# Patient Record
Sex: Male | Born: 1970 | Race: Black or African American | Hispanic: No | Marital: Single | Smoking: Never smoker
Health system: Southern US, Community
[De-identification: ages and names within clinical notes are randomized; demographics above are authoritative.]

## PROBLEM LIST (undated history)

## (undated) ENCOUNTER — Emergency Department (HOSPITAL_COMMUNITY): Discharge status: Absent without leave | Payer: Self-pay

## (undated) DIAGNOSIS — I890 Lymphedema, not elsewhere classified: Secondary | ICD-10-CM

## (undated) DIAGNOSIS — D649 Anemia, unspecified: Secondary | ICD-10-CM

## (undated) DIAGNOSIS — Z5900 Homelessness unspecified: Secondary | ICD-10-CM

## (undated) DIAGNOSIS — M009 Pyogenic arthritis, unspecified: Secondary | ICD-10-CM

## (undated) DIAGNOSIS — F200 Paranoid schizophrenia: Secondary | ICD-10-CM

## (undated) DIAGNOSIS — L0390 Cellulitis, unspecified: Secondary | ICD-10-CM

## (undated) HISTORY — DX: Pyogenic arthritis, unspecified: M00.9

---

## 2006-03-20 ENCOUNTER — Emergency Department (HOSPITAL_COMMUNITY): Admission: EM | Admit: 2006-03-20 | Discharge: 2006-03-21 | Payer: Self-pay | Admitting: Emergency Medicine

## 2007-09-29 ENCOUNTER — Ambulatory Visit: Payer: Self-pay | Admitting: Vascular Surgery

## 2007-09-29 ENCOUNTER — Inpatient Hospital Stay (HOSPITAL_COMMUNITY): Admission: EM | Admit: 2007-09-29 | Discharge: 2007-10-01 | Payer: Self-pay | Admitting: Emergency Medicine

## 2007-09-29 ENCOUNTER — Encounter (INDEPENDENT_AMBULATORY_CARE_PROVIDER_SITE_OTHER): Payer: Self-pay | Admitting: Internal Medicine

## 2008-09-26 ENCOUNTER — Emergency Department (HOSPITAL_COMMUNITY): Admission: EM | Admit: 2008-09-26 | Discharge: 2008-09-27 | Payer: Self-pay | Admitting: Emergency Medicine

## 2009-01-12 ENCOUNTER — Emergency Department (HOSPITAL_COMMUNITY): Admission: EM | Admit: 2009-01-12 | Discharge: 2009-01-13 | Payer: Self-pay | Admitting: Emergency Medicine

## 2009-03-24 ENCOUNTER — Other Ambulatory Visit: Payer: Self-pay | Admitting: Emergency Medicine

## 2009-03-25 ENCOUNTER — Emergency Department (HOSPITAL_COMMUNITY): Admission: EM | Admit: 2009-03-25 | Discharge: 2009-03-26 | Payer: Self-pay | Admitting: Emergency Medicine

## 2009-03-26 ENCOUNTER — Ambulatory Visit: Payer: Self-pay | Admitting: Psychiatry

## 2009-03-26 ENCOUNTER — Inpatient Hospital Stay (HOSPITAL_COMMUNITY): Admission: AD | Admit: 2009-03-26 | Discharge: 2009-03-29 | Payer: Self-pay | Admitting: Psychiatry

## 2010-10-04 LAB — DIFFERENTIAL
Eosinophils Relative: 5 % (ref 0–5)
Lymphocytes Relative: 53 % — ABNORMAL HIGH (ref 12–46)
Lymphs Abs: 2.3 10*3/uL (ref 0.7–4.0)
Monocytes Absolute: 0.4 10*3/uL (ref 0.1–1.0)

## 2010-10-04 LAB — ETHANOL: Alcohol, Ethyl (B): <5 mg/dL (ref 0–10)

## 2010-10-04 LAB — TRICYCLICS SCREEN, URINE: TCA Scrn: NOT DETECTED

## 2010-10-04 LAB — BASIC METABOLIC PANEL
GFR calc non Af Amer: >60 mL/min (ref >60)
Glucose, Bld: 97 mg/dL (ref 70–99)
Potassium: 3.8 mEq/L (ref 3.5–5.1)
Sodium: 139 mEq/L (ref 135–145)

## 2010-10-04 LAB — RAPID URINE DRUG SCREEN, HOSP PERFORMED
Barbiturates: NOT DETECTED
Cocaine: POSITIVE — AB

## 2010-10-04 LAB — CBC
HCT: 35.7 % — ABNORMAL LOW (ref 39.0–52.0)
Hemoglobin: 12.3 g/dL — ABNORMAL LOW (ref 13.0–17.0)
WBC: 4.3 10*3/uL (ref 4.0–10.5)

## 2010-10-10 LAB — CBC
HCT: 39.4 % (ref 39.0–52.0)
MCHC: 34.8 g/dL (ref 30.0–36.0)
MCV: 95.6 fL (ref 78.0–100.0)
Platelets: 164 10*3/uL (ref 150–400)
RDW: 13 % (ref 11.5–15.5)

## 2010-10-10 LAB — URINALYSIS, ROUTINE W REFLEX MICROSCOPIC
Hgb urine dipstick: NEGATIVE
Leukocytes, UA: NEGATIVE
Nitrite: NEGATIVE
Specific Gravity, Urine: 1.033 — ABNORMAL HIGH (ref 1.005–1.030)
Urobilinogen, UA: 1 mg/dL (ref 0.0–1.0)

## 2010-10-10 LAB — COMPREHENSIVE METABOLIC PANEL
Albumin: 3.5 g/dL (ref 3.5–5.2)
BUN: 27 mg/dL — ABNORMAL HIGH (ref 6–23)
Creatinine, Ser: 1.31 mg/dL (ref 0.4–1.5)
Total Bilirubin: 0.5 mg/dL (ref 0.3–1.2)
Total Protein: 6.3 g/dL (ref 6.0–8.3)

## 2010-10-10 LAB — URINE MICROSCOPIC-ADD ON

## 2010-10-10 LAB — DIFFERENTIAL
Basophils Absolute: 0 10*3/uL (ref 0.0–0.1)
Lymphocytes Relative: 45 % (ref 12–46)
Monocytes Absolute: 0.4 10*3/uL (ref 0.1–1.0)
Monocytes Relative: 9 % (ref 3–12)
Neutro Abs: 1.7 10*3/uL (ref 1.7–7.7)

## 2010-11-12 NOTE — H&P (Signed)
NAME:  YORDIN, RHODA NO.:  [id number]   MEDICAL RECORD NO.:  [id number]          PATIENT TYPE:  EMS   LOCATION:  ED                           FACILITY:  [facility]   PHYSICIAN:  Thomasenia Bottoms, MDDATE OF BIRTH:  January 17, 1971   DATE OF ADMISSION:  09/28/2007  DATE OF DISCHARGE:                              HISTORY & PHYSICAL   CHIEF COMPLAINT:  Right leg is bigger than left.   HISTORY OF PRESENTING ILLNESS:  Mr. Stebner is a 40 year old with history  of schizophrenia, who presents because his right leg is larger than his  left; he says it has been that way for several days, but he is unable to  be more specific.  He says that it did this once, went down and now it  has come back again, but he is not able to be more specific.  He is a  very poor historian.  He says his leg does not hurt.  He has not had a  chest pain or shortness of breath.  His appetite has been fine.   PAST MEDICAL HISTORY:  Significant for depression and schizoaffective  disorder versus schizophrenia.  In this computer, I see no  hospitalizations, but it appears he was transferred to [facility] in  2007 after hearing voices medications.   MEDICATIONS:  He says he takes none.   SOCIAL HISTORY:  He denies a smoking cigarettes, drinking alcohol or  using any illicit drugs.   FAMILY HISTORY:  None, though the patient is somewhat unreliable.   REVIEW OF SYSTEMS:  Deferred, as the patient is a very poor historian.   PHYSICAL EXAMINATION:  VITAL SIGNS:  In the emergency department, his  temperature was 98.5, blood pressure 115/56, pulse 81, respiratory rate  18, O2 SATs 97% on room air.  GENERAL:  On physical examination, the patient is in no acute distress.  He is well-nourished, well-developed.  He does have somewhat of a flat  affect.  HEENT:  Normocephalic, atraumatic.  His sclerae are anicteric.  Oral  mucosa moist.  NECK:  Supple.  No lymphadenopathy, no thyromegaly, no jugular venous  distention.  CARDIAC:  Regular rate and rhythm.  No murmurs, gallops or rubs  appreciated.  LUNGS:  Clear to auscultation bilaterally.  Somewhat diminished breath  sounds, but no wheezes, rhonchi or rales.  ABDOMEN:  Soft, nontender and nondistended.  Normoactive bowel sounds.  No masses are appreciated.  No rebound or guarding.  EXTREMITIES:  His extremities reveal no evidence of clubbing, cyanosis  or pitting edema.  His right lower extremity, however, is significantly  larger than his left lower extremity; it is noticeable in the thigh all  the way down to the ankle, but again, there is no pitting.  No skin  changes.  NEUROLOGICAL:  The patient is awake and alert.  He is cooperative.  He  does have the flat affect, but he is appropriate.  He moves each of his  extremities with no evidence of focal motor deficit.  He has 5/5  strength in his upper and lower extremities.  His  cranial nerves II-XII  are grossly intact.  SKIN:  Intact with no open lesions or rashes.  MUSCULOSKELETAL:  Examination reveals no evidence of effusion of his  joints.   DATA:  The patient's sodium is 140, potassium 3.5, chloride 106, bicarb  25, glucose 140, BUN 17, creatinine 1.26, AST 22, ALT 12, total  bilirubin 0.8.  PTT is 31, PT is 13.6 with an INR of 1.  D-dimer is  within normal limits.  CBC reveals a white blood cell count of 5.3,  hemoglobin 12.1, hematocrit 35 and platelet count is normal at 168,000.  His urinalysis is unremarkable.   ASSESSMENT AND PLAN:  1. Enlarged right leg.  The plan is to admit the patient for Dopplers      in the morning.  He has received a dose of full-strength Lovenox      here in the emergency department and we will continue this until he      rules out for DVT.  If he does rule in, we would have to ensure      that the patient will be able to follow up appropriately with      Coumadin.  He does not have a primary care physician at this time.  2. Schizophrenia and  depression.  It is unclear why the patient is not      on any medication.  He has no evidence of psychosis at this      juncture.      Thomasenia Bottoms, MD  Electronically Signed     CVC/MEDQ  D:  09/29/2007  T:  09/29/2007  Job:  [id number]

## 2010-11-15 NOTE — Discharge Summary (Signed)
NAME:  Jonathan Meadows, Jonathan Meadows                ACCOUNT NO.:  [id number]   MEDICAL RECORD NO.:  [id number]          PATIENT TYPE:  INP   LOCATION:  1513                         FACILITY:  [facility]   PHYSICIAN:  Mobolaji B. Bakare, M.D.DATE OF BIRTH:  1970-07-02   DATE OF ADMISSION:  09/28/2007  DATE OF DISCHARGE:  10/01/2007                               DISCHARGE SUMMARY   PRIMARY CARE PHYSICIAN:  Unassigned.   [facility]   FINAL DIAGNOSES:  1. Right thigh lymphedema, unclear etiology.  2. Hypertension.   SECONDARY DIAGNOSIS:  Schizophrenia, stable.   PROCEDURES:  1. Lower extremity Dopplers are negative for DVT.  2. CT scan of the pelvis showed findings compatible with cellulitis or      lymphedema of the right thigh.  3. CT scan of the right lower extremity showed no discrete mass,      hematoma, abscess that may be mild subcutaneous edema.  No evidence      of deep venous thrombosis.   BRIEF HISTORY:  Please refer to the admission H&P for full details.  In  brief, Jonathan Meadows is a 40 year old African American male with history of  schizophrenia which apparently has been stable.  He was not on any  regular medication.  He presented to the emergency room with chief  complaint of right leg bigger than the left.  On evaluation, this was  obviously bigger right.  There was obvious enlargement of the right  thigh more than the left.  On further questioning, patient's stated that  this  has been a chronic problem.  It comes on and off and this  recurrence occurred in the past couple of days prior to hospitalization.  Patient was seen in the emergency room during the night and concern was  for DVT hence he was empirically started on DVT treatment with Lovenox  and to obtain immediate lower extremity Dopplers the following day.  Upon evaluation on the following day, the swelling had receded; however,  he gave a distinct history of recurrent swelling.  Hence, further  evaluation was pursued with  a CT angiogram thigh and lower extremity.  Result is as noted above.  Patient has no fever, elevated white cell  count, or tenderness involving the right thigh to suggest cellulitis.  Hence, it was thought to be a lymphedema mostly given the recurrent  nature.  Patient tells me he had been advised previously to use  compression stockings but he has not been compliant with it.  Etiology  of this lymphedema is unknown.  CT scan of the pelvis and right thigh  excluded adenopathy or any benign tumor.  Patient was advised to wear  compression stockings to right leg while ambulating.   Hypertension.  He was noted to have elevated blood pressure during the  course of hospitalization ranging between 149/101 to 156/79 hence he was  started on hydrochlorothiazide 12.5 mg daily and patient was advised to  check BMET in 2 weeks at [facility].   DISCHARGE MEDICATIONS:  Hydrochlorothiazide 12.5 mg p.o. daily.   DISCHARGE INSTRUCTIONS:  1. Wear thigh-high compression stockings, right leg,  while ambulating.  2. Check BMET in 2 weeks.  3. Follow up with [facility].      Mobolaji B. Corky Downs, M.D.  Electronically Signed     MBB/MEDQ  D:  11/06/2007  T:  11/06/2007  Job:  [id number]   cc:   Mobolaji B. Corky Downs, M.D.

## 2011-03-24 LAB — URINALYSIS, ROUTINE W REFLEX MICROSCOPIC
Bilirubin Urine: NEGATIVE
Glucose, UA: NEGATIVE
Hgb urine dipstick: NEGATIVE
Ketones, ur: NEGATIVE
pH: 6

## 2011-03-25 LAB — CBC
HCT: 35 — ABNORMAL LOW
Hemoglobin: 12.1 — ABNORMAL LOW
MCHC: 34.7
MCV: 92.6
RDW: 13.7

## 2011-03-25 LAB — COMPREHENSIVE METABOLIC PANEL
BUN: 17
CO2: 25
Calcium: 8.7
GFR calc non Af Amer: >60
Glucose, Bld: 140 — ABNORMAL HIGH
Total Protein: 5.8 — ABNORMAL LOW

## 2011-03-25 LAB — DIFFERENTIAL
Basophils Relative: 1
Lymphs Abs: 1.9
Monocytes Relative: 12
Neutro Abs: 2.7
Neutrophils Relative %: 50

## 2011-03-25 LAB — BASIC METABOLIC PANEL
CO2: 27
Calcium: 9
Creatinine, Ser: 1.03
GFR calc Af Amer: >60
GFR calc non Af Amer: >60
Glucose, Bld: 98
Sodium: 138

## 2011-03-25 LAB — D-DIMER, QUANTITATIVE: D-Dimer, Quant: <0.22

## 2011-03-25 LAB — PROTIME-INR
INR: 1
Prothrombin Time: 13.6

## 2011-07-29 ENCOUNTER — Emergency Department (HOSPITAL_COMMUNITY)
Admission: EM | Admit: 2011-07-29 | Discharge: 2011-07-29 | Disposition: A | Discharge status: Home / Self Care | Payer: Medicaid Other | Attending: Emergency Medicine | Admitting: Emergency Medicine

## 2011-07-29 ENCOUNTER — Encounter (HOSPITAL_COMMUNITY): Payer: Self-pay | Admitting: *Deleted

## 2011-07-29 DIAGNOSIS — R51 Headache: Secondary | ICD-10-CM | POA: Insufficient documentation

## 2011-07-29 DIAGNOSIS — M79644 Pain in right finger(s): Secondary | ICD-10-CM

## 2011-07-29 DIAGNOSIS — R6884 Jaw pain: Secondary | ICD-10-CM | POA: Insufficient documentation

## 2011-07-29 DIAGNOSIS — M79609 Pain in unspecified limb: Secondary | ICD-10-CM | POA: Insufficient documentation

## 2011-07-29 DIAGNOSIS — M7989 Other specified soft tissue disorders: Secondary | ICD-10-CM | POA: Insufficient documentation

## 2011-07-29 DIAGNOSIS — M25569 Pain in unspecified knee: Secondary | ICD-10-CM | POA: Insufficient documentation

## 2011-07-29 MED ORDER — IBUPROFEN 800 MG PO TABS
800.0000 mg | ORAL_TABLET | Freq: Once | ORAL | Status: AC
  Administered 2011-07-29: 800 mg via ORAL

## 2011-07-29 MED ORDER — IBUPROFEN 800 MG PO TABS
ORAL_TABLET | ORAL | Status: AC
  Filled 2011-07-29: qty 1

## 2011-07-29 MED ORDER — IBUPROFEN 600 MG PO TABS: 600.0000 mg | ORAL_TABLET | Freq: Four times a day (QID) | ORAL | Status: AC | PRN

## 2011-07-29 MED ORDER — DOXYCYCLINE HYCLATE 100 MG PO CAPS: 100.0000 mg | ORAL_CAPSULE | Freq: Two times a day (BID) | ORAL | Status: AC

## 2011-07-29 NOTE — ED Notes (Signed)
The pt has multiple symptoms.  He has face pain  abd pain  He has cold cough and he hurts all over for several days

## 2011-07-29 NOTE — ED Provider Notes (Signed)
History     CSN: [id number]  Arrival date & time 07/29/11  2117   First MD Initiated Contact with Patient 07/29/11 2208      Chief Complaint  Patient presents with  . Facial Pain    (Consider location/radiation/quality/duration/timing/severity/associated sxs/prior treatment) HPI  41 year old male presents to the ED with multiple complaints. Patient states his jaw has been hurting in the past 2 days. Pain mainly worse whenever he clinch his teeth, but no significant pain with jaw movement. He believes pain may likely dental related.  He denies increasing pain with hot and cold water. He denies ear pain. Patient denies fever, neck pain, chest pain, shortness of breath, or abdominal pain. Patient also complained of pain to his right thumb. He believes there may have been a splinter embedded in his thumb. He noticed some swelling and tender to palpation. He denies joint pain or loss of sensation. Patient also complains of pain to both of his knee which has been ongoing for the past several days. Pain worsened with ambulation. He has had similar pain in the past. Patient states he has not tried anything to alleviate his pain.  History reviewed. No pertinent past medical history.  History reviewed. No pertinent past surgical history.  History reviewed. No pertinent family history.  History  Substance Use Topics  . Smoking status: Never Smoker   . Smokeless tobacco: Not on file  . Alcohol Use: No      Review of Systems  All other systems reviewed and are negative.    Allergies  Review of patient's allergies indicates no known allergies.  Home Medications  No current outpatient prescriptions on file.  BP 115/68  Pulse 73  Temp(Src) 97.9 F (36.6 C) (Oral)  Resp 19  SpO2 97%  Physical Exam  Nursing note and vitals reviewed. Constitutional: He appears well-developed and well-nourished. No distress.       Awake, alert, nontoxic appearance  HENT:  Head: Normocephalic and  atraumatic.  Right Ear: External ear normal.  Left Ear: External ear normal.  Mouth/Throat: Oropharynx is clear and moist. No oropharyngeal exudate.       Tenderness noted along the left lateral aspect of jaw with out any obvious evidence of abscess or overlying skin changes. No obvious periapical abscess on initial evaluation. No evidence of TMJ. Ear examination is unremarkable.  Eyes: Conjunctivae are normal. Pupils are equal, round, and reactive to light. Right eye exhibits no discharge. Left eye exhibits no discharge.  Neck: Neck supple.  Cardiovascular: Normal rate and regular rhythm.   Pulmonary/Chest: Effort normal. He exhibits no tenderness.  Abdominal: There is no tenderness. There is no rebound.  Musculoskeletal: He exhibits no tenderness.       Right knee: Normal.       Left knee: Normal.       Baseline ROM, no obvious new focal weakness  Neurological:       Mental status and motor strength appears baseline for patient and situation  Skin: No rash noted.     Psychiatric: He has a normal mood and affect.    ED Course  Procedures (including critical care time)  Labs Reviewed - No data to display No results found.   No diagnosis found.    MDM   patient with multiple complaints, however he is in no acute distress. His jaw evaluation is unremarkable. I do not believe he has TMJ. I do not see any obvious evidence of abscess. He has no trismus, or overlying skin  changes.  He may have a retained splinter to his right thumb. It is superficial enough that I anticipate it will get better. I will prescribe antibiotic for skin infection. Evaluation of his knee is unremarkable as there are no evidence of effusions. No evidence of infection noted. Patient will be discharged with doxycycline and ibuprofen. Patient voiced understanding and agreed with plan.        Fayrene Helper, PA-C 07/29/11 2307

## 2011-07-29 NOTE — ED Notes (Signed)
Discharge instructions reviewed; verbalizes understanding.  No questions asked; no further c/o's voiced.  Ambulatory to lobby.

## 2011-08-08 NOTE — ED Provider Notes (Signed)
Medical screening examination/treatment/procedure(s) were performed by non-physician practitioner and as supervising physician I was immediately available for consultation/collaboration.  Raeford Razor, MD 08/08/11 [phone]

## 2012-10-01 ENCOUNTER — Emergency Department (HOSPITAL_COMMUNITY)
Admission: EM | Admit: 2012-10-01 | Discharge: 2012-10-02 | Disposition: A | Discharge status: Home / Self Care | Payer: Medicaid Other | Attending: Emergency Medicine | Admitting: Emergency Medicine

## 2012-10-01 ENCOUNTER — Encounter (HOSPITAL_COMMUNITY): Payer: Self-pay | Admitting: Emergency Medicine

## 2012-10-01 DIAGNOSIS — Z862 Personal history of diseases of the blood and blood-forming organs and certain disorders involving the immune mechanism: Secondary | ICD-10-CM | POA: Insufficient documentation

## 2012-10-01 DIAGNOSIS — L089 Local infection of the skin and subcutaneous tissue, unspecified: Secondary | ICD-10-CM | POA: Insufficient documentation

## 2012-10-01 NOTE — ED Notes (Signed)
C/o pain and swelling to L 2nd digit and R leg x 2 days.  States he was seen at [facility] 2 days ago for same and told he had an infection.  When asked if he was given a Rx for antibiotics or pain medication, pt states, "I can't recall."

## 2012-10-02 MED ORDER — HYDROCODONE-ACETAMINOPHEN 5-325 MG PO TABS
1.0000 | ORAL_TABLET | Freq: Once | ORAL | Status: AC
  Administered 2012-10-02: 1 via ORAL
  Filled 2012-10-02: qty 1

## 2012-10-02 MED ORDER — HYDROCODONE-ACETAMINOPHEN 5-325 MG PO TABS: 1.0000 | ORAL_TABLET | ORAL | Status: DC | PRN

## 2012-10-02 MED ORDER — CEPHALEXIN 250 MG PO CAPS
500.0000 mg | ORAL_CAPSULE | Freq: Once | ORAL | Status: AC
  Administered 2012-10-02: 500 mg via ORAL
  Filled 2012-10-02: qty 2

## 2012-10-02 MED ORDER — CEPHALEXIN 500 MG PO CAPS: 1000.0000 mg | ORAL_CAPSULE | Freq: Two times a day (BID) | ORAL | Status: DC

## 2012-10-02 NOTE — ED Notes (Signed)
PA at bedside to numb pt's finger.

## 2012-10-03 NOTE — ED Provider Notes (Signed)
History     CSN: [id number]  Arrival date & time 10/01/12  2226   None     Chief Complaint  Patient presents with  . Hand Pain  . Leg Pain    (Consider location/radiation/quality/duration/timing/severity/associated sxs/prior treatment) HPI History provided by pt.   Pt a poor historian. C/o severe pain of L index finger.  Developed a blister some time ago that drained purulent fluid when he squeezed it.  Afterwards, pain and edema increased.  No associated fever or paresthesias.  Does not recall an injury to finger.  Does not bite his fingernails. Was evaluated at [facility] 2 days ago, blister de-roofed and discharged home.  He is unsure of whether or not he was prescribed abx or pain medication but he has not taken either.   History reviewed. No pertinent past medical history.  History reviewed. No pertinent past surgical history.  No family history on file.  History  Substance Use Topics  . Smoking status: Never Smoker   . Smokeless tobacco: Not on file  . Alcohol Use: No      Review of Systems  All other systems reviewed and are negative.    Allergies  Review of patient's allergies indicates no known allergies.  Home Medications   Current Outpatient Rx  Name  Route  Sig  Dispense  Refill  . cephALEXin (KEFLEX) 500 MG capsule   Oral   Take 2 capsules (1,000 mg total) by mouth 2 (two) times daily.   28 capsule   0   . HYDROcodone-acetaminophen (NORCO/VICODIN) 5-325 MG per tablet   Oral   Take 1 tablet by mouth every 4 (four) hours as needed for pain.   20 tablet   0     BP 119/77  Pulse 93  Temp(Src) 97.8 F (36.6 C) (Oral)  Resp 19  SpO2 96%  Physical Exam  Nursing note and vitals reviewed. Constitutional: He is oriented to person, place, and time. He appears well-developed and well-nourished. No distress.  HENT:  Head: Normocephalic and atraumatic.  Eyes:  Normal appearance  Neck: Normal range of motion.  Pulmonary/Chest: Effort normal.   Musculoskeletal: Normal range of motion.  Epidermis has been removed from dorsal surface of middle phalanx of L index finger. There is purulent drainage from distal aspect of wound.  Proximal cuticle baggy and tender and likely the initial source of infection.  Nml ROM all joints of this digit and distal sensation intact.   Neurological: He is alert and oriented to person, place, and time.  Psychiatric: He has a normal mood and affect. His behavior is normal.    ED Course  Procedures (including critical care time)  Labs Reviewed - No data to display No results found.   1. Infection of index finger       MDM  42yo healthy M presents w/ L index finger infection.  Had blister on dorsal aspect of middle phalanx unroofed at [facility] 2 days ago, has not taken abx or analgesic since, pain has increased and there continues to be purulent drainage from distal aspect of wound.  Based on exam today, it appears that infection originated at proximal cuticle.  Cuticle baggy and tender currently.  Digital block performed w/ 4cc lidocaine w/out epi which gave patient relief of pain.  Lifted cuticle w/ 18 gauge needle and no further drainage.  Wound wrapped by nursing staff, pt prescribed keflex and vicodin, referred to Hand and because its a weekend, instructed to return to  ED tomorrow for recheck.     Otilio Miu, PA-C 10/03/12 2231

## 2012-10-09 ENCOUNTER — Emergency Department (HOSPITAL_COMMUNITY): Payer: Self-pay

## 2012-10-09 ENCOUNTER — Inpatient Hospital Stay (HOSPITAL_COMMUNITY)
Admission: EM | Admit: 2012-10-09 | Discharge: 2012-10-11 | DRG: 603 | Disposition: A | Discharge status: Home / Self Care | Payer: Medicaid Other | Attending: Internal Medicine | Admitting: Internal Medicine

## 2012-10-09 ENCOUNTER — Encounter (HOSPITAL_COMMUNITY): Payer: Self-pay | Admitting: *Deleted

## 2012-10-09 DIAGNOSIS — L089 Local infection of the skin and subcutaneous tissue, unspecified: Secondary | ICD-10-CM

## 2012-10-09 DIAGNOSIS — D649 Anemia, unspecified: Secondary | ICD-10-CM | POA: Diagnosis present

## 2012-10-09 DIAGNOSIS — L02519 Cutaneous abscess of unspecified hand: Principal | ICD-10-CM | POA: Diagnosis present

## 2012-10-09 DIAGNOSIS — R600 Localized edema: Secondary | ICD-10-CM

## 2012-10-09 DIAGNOSIS — D1739 Benign lipomatous neoplasm of skin and subcutaneous tissue of other sites: Secondary | ICD-10-CM | POA: Diagnosis present

## 2012-10-09 DIAGNOSIS — F1410 Cocaine abuse, uncomplicated: Secondary | ICD-10-CM | POA: Diagnosis present

## 2012-10-09 DIAGNOSIS — M7989 Other specified soft tissue disorders: Secondary | ICD-10-CM | POA: Diagnosis present

## 2012-10-09 DIAGNOSIS — I890 Lymphedema, not elsewhere classified: Secondary | ICD-10-CM | POA: Diagnosis present

## 2012-10-09 DIAGNOSIS — L03019 Cellulitis of unspecified finger: Principal | ICD-10-CM | POA: Diagnosis present

## 2012-10-09 HISTORY — DX: Anemia, unspecified: D64.9

## 2012-10-09 LAB — CBC WITH DIFFERENTIAL/PLATELET
Eosinophils Relative: 5 % (ref 0–5)
HCT: 30.4 % — ABNORMAL LOW (ref 39.0–52.0)
Hemoglobin: 11 g/dL — ABNORMAL LOW (ref 13.0–17.0)
Lymphocytes Relative: 42 % (ref 12–46)
Lymphs Abs: 1.6 10*3/uL (ref 0.7–4.0)
MCV: 89.4 fL (ref 78.0–100.0)
Monocytes Relative: 12 % (ref 3–12)
Neutro Abs: 1.6 10*3/uL — ABNORMAL LOW (ref 1.7–7.7)
RBC: 3.4 MIL/uL — ABNORMAL LOW (ref 4.22–5.81)
WBC: 4 10*3/uL (ref 4.0–10.5)

## 2012-10-09 LAB — POCT I-STAT, CHEM 8
BUN: 22 mg/dL (ref 6–23)
Chloride: 109 mEq/L (ref 96–112)
HCT: 32 % — ABNORMAL LOW (ref 39.0–52.0)
Sodium: 139 mEq/L (ref 135–145)
TCO2: 22 mmol/L (ref 0–100)

## 2012-10-09 MED ORDER — VANCOMYCIN HCL IN DEXTROSE 1-5 GM/200ML-% IV SOLN
1000.0000 mg | Freq: Once | INTRAVENOUS | Status: AC
  Administered 2012-10-10: 1000 mg via INTRAVENOUS
  Filled 2012-10-09: qty 200

## 2012-10-09 NOTE — ED Notes (Addendum)
Pt presents with increased pain to left index finger since dx with infection to finger last week here, finger wrapped at present.  Pt also complaining of right leg swelling for the past few weeks.  Right lower leg appears larger than left lower leg.  Pt given gown to change into.

## 2012-10-09 NOTE — ED Provider Notes (Signed)
History     CSN: [id number]  Arrival date & time 10/09/12  2156   First MD Initiated Contact with Patient 10/09/12 2302      Chief Complaint  Patient presents with  . Hand Pain    (Consider location/radiation/quality/duration/timing/severity/associated sxs/prior treatment) Patient is a 42 y.o. Meadows presenting with hand pain. The history is provided by the patient.  Hand Pain Pertinent negatives include no chest pain, no abdominal pain, no headaches and no shortness of breath.  patient presents with infection in his left index finger. He states that he has been seen for the same for the last couple weeks. No fevers. Has been on antibiotics. He states he's had continued drainage. He also has had swelling in his right leg. He states it has been going on for a month also. No fevers. No trauma. No difficulty breathing. No chest pain. He states he is not had swelling like this before.  Past Medical History  Diagnosis Date  . Anemia     History reviewed. No pertinent past surgical history.  Family History  Problem Relation Age of Onset  . CAD      Neg HX  . Hypertension      Neg Hx  . Diabetes      Neg Hx  . Cancer      Neg Hx    History  Substance Use Topics  . Smoking status: Never Smoker   . Smokeless tobacco: Not on file  . Alcohol Use: No      Review of Systems  Constitutional: Negative for fever, activity change and appetite change.  HENT: Negative for neck stiffness.   Eyes: Negative for pain.  Respiratory: Negative for chest tightness and shortness of breath.   Cardiovascular: Positive for leg swelling. Negative for chest pain.  Gastrointestinal: Negative for nausea, vomiting, abdominal pain and diarrhea.  Genitourinary: Negative for flank pain.  Musculoskeletal: Negative for back pain.  Skin: Positive for wound. Negative for rash.  Neurological: Negative for weakness, numbness and headaches.  Psychiatric/Behavioral: Negative for behavioral problems.     Allergies  Review of patient's allergies indicates no known allergies.  Home Medications   No current outpatient prescriptions on file.  BP 111/65  Pulse 73  Temp(Src) 98 F (36.7 C) (Oral)  Resp 16  Ht 6\' 1"  (1.854 m)  Wt 188 lb 7.9 oz (85.5 kg)  BMI 24.87 kg/m2  SpO2 100%  Physical Exam  Constitutional: He is oriented to person, place, and time. He appears well-developed and well-nourished.  HENT:  Head: Normocephalic.  Eyes: Pupils are equal, round, and reactive to light.  Neck: Normal range of motion.  Cardiovascular: Normal rate and regular rhythm.   Pulmonary/Chest: Effort normal and breath sounds normal.  Abdominal: Soft. There is no tenderness.  Musculoskeletal: He exhibits edema and tenderness.  Left index finger has had top layers of tissue removed over dorsum of that middle phalanx. There is some separation of tissue up to the nail bed with PERRLA drainage. There is some firmness to the pad of the finger with the empty feeling area over the distal phalanx. Decreased sensation over the distal and medial phalanx. Some flexion extension, however has pain. Mild swelling of finger. Patient has enlargement of entire right lower extremity. It is firm. He has a dorsalis pedis pulse. Sensation is grossly intact. Some mildly decreased movement. Color is good.  Neurological: He is alert and oriented to person, place, and time.  Skin: No rash noted.    ED  Course  Procedures (including critical care time)  Labs Reviewed  CBC WITH DIFFERENTIAL - Abnormal; Notable for the following:    RBC 3.40 (*)    Hemoglobin 11.0 (*)    HCT 30.4 (*)    MCHC 36.2 (*)    Neutrophils Relative 39 (*)    Basophils Relative 2 (*)    Neutro Abs 1.6 (*)    All other components within normal limits  CK - Abnormal; Notable for the following:    Total CK 381 (*)    All other components within normal limits  URINE RAPID DRUG SCREEN (HOSP PERFORMED) - Abnormal; Notable for the following:     Cocaine POSITIVE (*)    Tetrahydrocannabinol POSITIVE (*)    All other components within normal limits  BASIC METABOLIC PANEL - Abnormal; Notable for the following:    GFR calc non Af Amer 75 (*)    GFR calc Af Amer 86 (*)    All other components within normal limits  CBC - Abnormal; Notable for the following:    WBC 3.7 (*)    RBC 3.66 (*)    Hemoglobin 11.6 (*)    HCT 33.1 (*)    All other components within normal limits  RETICULOCYTES - Abnormal; Notable for the following:    RBC. 3.Jonathan (*)    All other components within normal limits  POCT I-STAT, CHEM 8 - Abnormal; Notable for the following:    Hemoglobin 10.9 (*)    HCT 32.0 (*)    All other components within normal limits  SEDIMENTATION RATE  VITAMIN B12  FOLATE  IRON AND TIBC  FERRITIN   Dg Finger Index Left  10/09/2012  *RADIOLOGY REPORT*  Clinical Data: Left index finger laceration, pain, and swelling.  LEFT INDEX FINGER 2+V  Comparison: None.  Findings: Soft tissue swelling and subcutaneous emphysema is seen in the distal index finger.  No evidence of fracture or dislocation.  No other bone abnormality identified.  IMPRESSION: Distal index finger soft tissue swelling and subcutaneous emphysema.  No fracture identified.   Original Report Authenticated By: Myles Rosenthal, M.D.      1. Finger infection   2. Lower extremity edema   3. Anemia   4. Edema of right lower extremity   5. Cellulitis and abscess of finger, right       MDM  Patient with finger infection. Cellulitis and abscess. Also has swelling in right lower extremity. He has failed outpatient antibiotics and will be admitted to internal medicine. Venous duplex not available at this time and will be done as an inpatient        Harrold Donath R. Rubin Payor, MD 10/10/12 [phone]

## 2012-10-09 NOTE — ED Notes (Signed)
Pt states he was seen for finger infection last week and came back because it is still painful and swollen.  Also c/o right leg pain and swelling x 1 week.

## 2012-10-10 ENCOUNTER — Encounter (HOSPITAL_COMMUNITY): Payer: Self-pay | Admitting: Internal Medicine

## 2012-10-10 DIAGNOSIS — M7989 Other specified soft tissue disorders: Secondary | ICD-10-CM | POA: Diagnosis present

## 2012-10-10 DIAGNOSIS — D649 Anemia, unspecified: Secondary | ICD-10-CM

## 2012-10-10 DIAGNOSIS — L089 Local infection of the skin and subcutaneous tissue, unspecified: Secondary | ICD-10-CM

## 2012-10-10 DIAGNOSIS — L02519 Cutaneous abscess of unspecified hand: Secondary | ICD-10-CM | POA: Diagnosis present

## 2012-10-10 DIAGNOSIS — R609 Edema, unspecified: Secondary | ICD-10-CM

## 2012-10-10 LAB — RAPID URINE DRUG SCREEN, HOSP PERFORMED
Amphetamines: NOT DETECTED
Cocaine: POSITIVE — AB
Opiates: NOT DETECTED
Tetrahydrocannabinol: POSITIVE — AB

## 2012-10-10 LAB — SEDIMENTATION RATE: Sed Rate: 10 mm/hr (ref 0–16)

## 2012-10-10 LAB — BASIC METABOLIC PANEL
CO2: 22 mEq/L (ref 19–32)
Chloride: 109 mEq/L (ref 96–112)
GFR calc Af Amer: 86 mL/min — ABNORMAL LOW (ref >90)
Potassium: 4.6 mEq/L (ref 3.5–5.1)

## 2012-10-10 LAB — RETICULOCYTES
RBC.: 3.38 MIL/uL — ABNORMAL LOW (ref 4.22–5.81)
Retic Count, Absolute: 50.7 10*3/uL (ref 19.0–186.0)
Retic Ct Pct: 1.5 % (ref 0.4–3.1)

## 2012-10-10 LAB — CK: Total CK: 381 U/L — ABNORMAL HIGH (ref 7–232)

## 2012-10-10 LAB — CBC
HCT: 33.1 % — ABNORMAL LOW (ref 39.0–52.0)
Hemoglobin: 11.6 g/dL — ABNORMAL LOW (ref 13.0–17.0)
MCV: 90.4 fL (ref 78.0–100.0)
Platelets: 227 10*3/uL (ref 150–400)
RDW: 13.5 % (ref 11.5–15.5)
WBC: 3.7 10*3/uL — ABNORMAL LOW (ref 4.0–10.5)

## 2012-10-10 LAB — VITAMIN B12: Vitamin B-12: 388 pg/mL (ref 211–911)

## 2012-10-10 MED ORDER — LIDOCAINE HCL (PF) 1 % IJ SOLN
30.0000 mL | Freq: Once | INTRAMUSCULAR | Status: AC
  Administered 2012-10-10: 30 mL via SUBCUTANEOUS
  Filled 2012-10-10: qty 30

## 2012-10-10 MED ORDER — ONDANSETRON HCL 4 MG PO TABS: 4.0000 mg | ORAL_TABLET | Freq: Four times a day (QID) | ORAL | Status: DC | PRN

## 2012-10-10 MED ORDER — ALUM & MAG HYDROXIDE-SIMETH 200-200-20 MG/5ML PO SUSP: 30.0000 mL | Freq: Four times a day (QID) | ORAL | Status: DC | PRN

## 2012-10-10 MED ORDER — ONDANSETRON HCL 4 MG/2ML IJ SOLN: 4.0000 mg | Freq: Four times a day (QID) | INTRAMUSCULAR | Status: DC | PRN

## 2012-10-10 MED ORDER — SODIUM CHLORIDE 0.9 % IV SOLN: INTRAVENOUS | Status: DC

## 2012-10-10 MED ORDER — HYDROMORPHONE HCL PF 1 MG/ML IJ SOLN: 0.5000 mg | INTRAMUSCULAR | Status: DC | PRN

## 2012-10-10 MED ORDER — ACETAMINOPHEN 650 MG RE SUPP: 650.0000 mg | Freq: Four times a day (QID) | RECTAL | Status: DC | PRN

## 2012-10-10 MED ORDER — BACITRACIN-NEOMYCIN-POLYMYXIN OINTMENT TUBE
TOPICAL_OINTMENT | Freq: Every day | CUTANEOUS | Status: DC
  Administered 2012-10-11: via TOPICAL
  Filled 2012-10-10 (×2): qty 15

## 2012-10-10 MED ORDER — IBUPROFEN 400 MG PO TABS
400.0000 mg | ORAL_TABLET | Freq: Four times a day (QID) | ORAL | Status: DC | PRN
  Administered 2012-10-10 – 2012-10-11 (×4): 400 mg via ORAL
  Filled 2012-10-10 (×5): qty 1

## 2012-10-10 MED ORDER — OXYCODONE HCL 5 MG PO TABS: 5.0000 mg | ORAL_TABLET | ORAL | Status: DC | PRN

## 2012-10-10 MED ORDER — OXYCODONE-ACETAMINOPHEN 5-325 MG PO TABS
2.0000 | ORAL_TABLET | ORAL | Status: DC | PRN
  Administered 2012-10-10 – 2012-10-11 (×8): 2 via ORAL
  Filled 2012-10-10 (×8): qty 2

## 2012-10-10 MED ORDER — VANCOMYCIN HCL IN DEXTROSE 1-5 GM/200ML-% IV SOLN
1000.0000 mg | Freq: Two times a day (BID) | INTRAVENOUS | Status: DC
  Administered 2012-10-10 – 2012-10-11 (×3): 1000 mg via INTRAVENOUS
  Filled 2012-10-10 (×4): qty 200

## 2012-10-10 MED ORDER — ACETAMINOPHEN 325 MG PO TABS: 650.0000 mg | ORAL_TABLET | Freq: Four times a day (QID) | ORAL | Status: DC | PRN

## 2012-10-10 MED ORDER — ZOLPIDEM TARTRATE 5 MG PO TABS: 5.0000 mg | ORAL_TABLET | Freq: Every evening | ORAL | Status: DC | PRN

## 2012-10-10 NOTE — Progress Notes (Signed)
VASCULAR LAB PRELIMINARY  PRELIMINARY  PRELIMINARY  PRELIMINARY  Right lower extremity venous Doppler completed.    Preliminary report:  There is no DVT or SVT noted in the right lower extremity.  Angelyne Terwilliger, RVT 10/10/2012, 10:48 AM

## 2012-10-10 NOTE — Progress Notes (Signed)
RN Eileen Stanford in ed report that pt uncoop in the ed and kept teling nurse to "get Out!' Unable to do adm history or obtain needed info due to pt has been uncooperative and verbally abusive to nursing staff since arrival to floor.Loud noise heard coming from room,when self and Dorann Lodge RN attempted to enter room to see if pt safe,pt yelled and slammed door forcefully directly into my face and upper body.Note placed on door to knock loudly before entering.safety and fall prev discussed with pt upon arrival to floor and had asked him to call before getting oob by himself. Linward Headland D

## 2012-10-10 NOTE — Progress Notes (Signed)
ANTIBIOTIC CONSULT NOTE - INITIAL  Pharmacy Consult for vancomycin Indication: cellulitis  No Known Allergies  Patient Measurements: Height: 6\' 1"  (185.4 cm) Weight: 188 lb 7.9 oz (85.5 kg) IBW/kg (Calculated) : 79.9  Vital Signs: Temp: 98.3 F (36.8 C) (04/13 0217) Temp src: Oral (04/12 2202) BP: 111/65 mmHg (04/13 0217) Pulse Rate: 85 (04/13 0217)  Labs:  Recent Labs  10/09/12 2310 10/09/12 2330  WBC 4.0  --   HGB 11.0* 10.9*  PLT 241  --   CREATININE  --  1.10   Estimated Creatinine Clearance: 98.9 ml/min (by C-G formula based on Cr of 1.1).   Microbiology: No results found for this or any previous visit (from the past 720 hour(s)).  Medical History: Past Medical History  Diagnosis Date  . Anemia     Medications:  Prescriptions prior to admission  Medication Sig Dispense Refill  . aspirin 325 MG tablet Take 325-650 mg by mouth every 6 (six) hours as needed for pain.      . cephALEXin (KEFLEX) 500 MG capsule Take 2 capsules (1,000 mg total) by mouth 2 (two) times daily.  28 capsule  0  . ibuprofen (ADVIL,MOTRIN) 200 MG tablet Take 400 mg by mouth every 6 (six) hours as needed for pain.       Scheduled:  . [COMPLETED] vancomycin  1,000 mg Intravenous Once  . vancomycin  1,000 mg Intravenous Q12H   Infusions:  . sodium chloride      Assessment: 42yo male c/o worsening pain, redness, and swelling of left index finger, had been seen in ED and placed on Keflex as outpt but infection continued to worsen, to be admitted for IV ABX.  Goal of Therapy:  Vancomycin trough level 10-15 mcg/ml  Plan:  Will begin vancomycin 1000mg  IV Q12H and monitor CBC, Cx, levels prn.  Vernard Gambles, PharmD, BCPS  10/10/2012,4:52 AM

## 2012-10-10 NOTE — H&P (Signed)
[facility] History and Physical  Jonathan Meadows OZH:[id number] DOB: 1970-08-04 DOA: 10/09/2012  Referring physician: EDP PCP: No PCP Per Patient  Specialists:   Chief Complaint: Worsening Infection of Left Index Finger  HPI: Jonathan Meadows is a 42 y.o. male who presents to the ED with complaints of worsening pain,redness, and swelling of his Left Index Finger over the past 2 weeks.  He had been seen in the ED and placed on Keflex therapy but the infection continued to worsen.   He also has complaints of swelling in the right lower leg x 2 weeks. He denies any trauma to the area.      Review of Systems: The patient denies anorexia, fever, chills, headaches, nightsweats, weight loss, vision loss, decreased hearing, hoarseness, chest pain, syncope, dyspnea on exertion, balance deficits, hemoptysis, abdominal pain, nausea, vomiting, diarrhea, constipation, melena, hematochezia, severe indigestion/heartburn, hematuria, dysuria, incontinence, muscle weakness, suspicious skin lesions, transient blindness, difficulty walking, depression, unusual weight change, abnormal bleeding, enlarged lymph nodes, angioedema, and breast masses.    Past Medical History  Diagnosis Date  . Anemia    History reviewed. No pertinent past surgical history.    Medications:  HOME MEDS: Prior to Admission medications   Medication Sig Start Date End Date Taking? Authorizing Provider  aspirin 325 MG tablet Take 325-650 mg by mouth every 6 (six) hours as needed for pain.   Yes Historical Provider, MD  cephALEXin (KEFLEX) 500 MG capsule Take 2 capsules (1,000 mg total) by mouth 2 (two) times daily. 10/02/12  Yes Catherine E Schinlever, PA-C  ibuprofen (ADVIL,MOTRIN) 200 MG tablet Take 400 mg by mouth every 6 (six) hours as needed for pain.   Yes Historical Provider, MD    Allergies:  No Known Allergies  Social History:   reports that he has never smoked. He does not have any smokeless tobacco history on file. He  reports that he does not drink alcohol or use illicit drugs.  Family History: Family History  Problem Relation Age of Onset  . CAD      Neg HX  . Hypertension      Neg Hx  . Diabetes      Neg Hx  . Cancer      Neg Hx      Physical Exam:  GEN:  Pleasant 42 year old well nourished and well developed African American Male examined  and in no acute distress; cooperative with exam Filed Vitals:   10/09/12 2202  BP: 107/83  Pulse: 74  Temp: 98 F (36.7 C)  TempSrc: Oral  Resp: 20  SpO2: 95%   Blood pressure 107/83, pulse 74, temperature 98 F (36.7 C), temperature source Oral, resp. rate 20, SpO2 95.00%. PSYCH: He is alert and oriented x4; does not appear anxious does not appear depressed; affect is normal HEENT: Normocephalic and Atraumatic, Mucous membranes pink; PERRLA; EOM intact; Fundi:  Benign;  No scleral icterus, Nares: Patent, Oropharynx: Clear, Fair Dentition, Neck:  FROM, no cervical lymphadenopathy nor thyromegaly or carotid bruit; no JVD; Breasts:: Not examined CHEST WALL: No tenderness CHEST: Normal respiration, clear to auscultation bilaterally HEART: Regular rate and rhythm; no murmurs rubs or gallops BACK: No kyphosis or scoliosis; no CVA tenderness ABDOMEN: Positive Bowel Sounds, Scaphoid, soft non-tender; no masses, no organomegaly.   Rectal Exam: Not done EXTREMITIES: No cyanosis, clubbing or 2+ TAUT EDEMA  Of the RLE;  no ulcerations. Genitalia: not examined PULSES: 2+ and symmetric SKIN: Normal hydration no rash or ulceration CNS: Cranial nerves 2-12  grossly intact no focal neurologic deficit     Labs & Imaging Results for orders placed during the hospital encounter of 10/09/12 (from the past 48 hour(s))  CBC WITH DIFFERENTIAL     Status: Abnormal   Collection Time    10/09/12 11:10 PM      Result Value Range   WBC 4.0  4.0 - 10.5 K/uL   RBC 3.40 (*) 4.22 - 5.81 MIL/uL   Hemoglobin 11.0 (*) 13.0 - 17.0 g/dL   HCT 16.1 (*) 09.6 - 04.5 %   MCV  89.4  78.0 - 100.0 fL   MCH 32.4  26.0 - 34.0 pg   MCHC 36.2 (*) 30.0 - 36.0 g/dL   RDW 40.9  81.1 - 91.4 %   Platelets 241  150 - 400 K/uL   Neutrophils Relative 39 (*) 43 - 77 %   Lymphocytes Relative 42  12 - 46 %   Monocytes Relative 12  3 - 12 %   Eosinophils Relative 5  0 - 5 %   Basophils Relative 2 (*) 0 - 1 %   Neutro Abs 1.6 (*) 1.7 - 7.7 K/uL   Lymphs Abs 1.6  0.7 - 4.0 K/uL   Monocytes Absolute 0.5  0.1 - 1.0 K/uL   Eosinophils Absolute 0.2  0.0 - 0.7 K/uL   Basophils Absolute 0.1  0.0 - 0.1 K/uL   WBC Morphology ATYPICAL LYMPHOCYTES     Smear Review PLATELETS APPEAR ADEQUATE    SEDIMENTATION RATE     Status: None   Collection Time    10/09/12 11:10 PM      Result Value Range   Sed Rate 10  0 - 16 mm/hr  CK     Status: Abnormal   Collection Time    10/09/12 11:19 PM      Result Value Range   Total CK 381 (*) 7 - 232 U/L  POCT I-STAT, CHEM 8     Status: Abnormal   Collection Time    10/09/12 11:30 PM      Result Value Range   Sodium 139  135 - 145 mEq/L   Potassium 4.7  3.5 - 5.1 mEq/L   Chloride 109  96 - 112 mEq/L   BUN 22  6 - 23 mg/dL   Creatinine, Ser 7.82  0.50 - 1.35 mg/dL   Glucose, Bld 88  70 - 99 mg/dL   Calcium, Ion 9.56  2.13 - 1.23 mmol/L   TCO2 22  0 - 100 mmol/L   Hemoglobin 10.9 (*) 13.0 - 17.0 g/dL   HCT 08.6 (*) 57.8 - 46.9 %     Radiological Exams on Admission: Dg Finger Index Left  10/09/2012  *RADIOLOGY REPORT*  Clinical Data: Left index finger laceration, pain, and swelling.  LEFT INDEX FINGER 2+V  Comparison: None.  Findings: Soft tissue swelling and subcutaneous emphysema is seen in the distal index finger.  No evidence of fracture or dislocation.  No other bone abnormality identified.  IMPRESSION: Distal index finger soft tissue swelling and subcutaneous emphysema.  No fracture identified.   Original Report Authenticated By: Myles Rosenthal, M.D.       Assessment/Plan Principal Problem:   Cellulitis and abscess of finger Active  Problems:   Edema of right lower extremity   Anemia   1.  Cellulitis-of Left Index Finger, Placed on IV Vancomycin.     2.   Edema RLE-  Venous Duplex US ordered for the AM.     3.  Anemia-  Check anemia panel.     4.   DVT prophylaxis with SCDs.         Code Status:  FULL CODE Family Communication:  No Family Present Disposition Plan:     Return to Home on discharge  Time spent:  45 Minutes  Ron Parker [facility] Pager [phone]  If 7PM-7AM, please contact night-coverage [email or web address] Password [email or web address] 10/10/2012, 1:09 AM

## 2012-10-10 NOTE — ED Provider Notes (Signed)
Medical screening examination/treatment/procedure(s) were performed by non-physician practitioner and as supervising physician I was immediately available for consultation/collaboration.  Lavan Imes M Bess Saltzman, MD 10/10/12 2157 

## 2012-10-10 NOTE — Consult Note (Signed)
Reason for Consult:infected finger Referring Physician: Lovell Sheehan, MD  Jonathan Meadows is an 42 y.o. right handed male.  HPI: pt states IF (left ) became irritated ~2wks ago, started as a blister, has been to urgent care since then for ? I&d, represented last pm to ER with pain, swelling, draining finger.  Admitted, placed on antibiotics.  Past Medical History  Diagnosis Date  . Anemia     History reviewed. No pertinent past surgical history.  Family History  Problem Relation Age of Onset  . CAD      Neg HX  . Hypertension      Neg Hx  . Diabetes      Neg Hx  . Cancer      Neg Hx    Social History:  reports that he has never smoked. He does not have any smokeless tobacco history on file. He reports that he does not drink alcohol or use illicit drugs.  Allergies: No Known Allergies  Medications: I have reviewed the patient's current medications.  Results for orders placed during the hospital encounter of 10/09/12 (from the past 48 hour(s))  CBC WITH DIFFERENTIAL     Status: Abnormal   Collection Time    10/09/12 11:10 PM      Result Value Range   WBC 4.0  4.0 - 10.5 K/uL   RBC 3.40 (*) 4.22 - 5.81 MIL/uL   Hemoglobin 11.0 (*) 13.0 - 17.0 g/dL   HCT 86.5 (*) 78.4 - 69.6 %   MCV 89.4  78.0 - 100.0 fL   MCH 32.4  26.0 - 34.0 pg   MCHC 36.2 (*) 30.0 - 36.0 g/dL   RDW 29.5  28.4 - 13.2 %   Platelets 241  150 - 400 K/uL   Neutrophils Relative 39 (*) 43 - 77 %   Lymphocytes Relative 42  12 - 46 %   Monocytes Relative 12  3 - 12 %   Eosinophils Relative 5  0 - 5 %   Basophils Relative 2 (*) 0 - 1 %   Neutro Abs 1.6 (*) 1.7 - 7.7 K/uL   Lymphs Abs 1.6  0.7 - 4.0 K/uL   Monocytes Absolute 0.5  0.1 - 1.0 K/uL   Eosinophils Absolute 0.2  0.0 - 0.7 K/uL   Basophils Absolute 0.1  0.0 - 0.1 K/uL   WBC Morphology ATYPICAL LYMPHOCYTES     Smear Review PLATELETS APPEAR ADEQUATE    SEDIMENTATION RATE     Status: None   Collection Time    10/09/12 11:10 PM      Result Value Range   Sed Rate 10  0 - 16 mm/hr  CK     Status: Abnormal   Collection Time    10/09/12 11:19 PM      Result Value Range   Total CK 381 (*) 7 - 232 U/L  POCT I-STAT, CHEM 8     Status: Abnormal   Collection Time    10/09/12 11:30 PM      Result Value Range   Sodium 139  135 - 145 mEq/L   Potassium 4.7  3.5 - 5.1 mEq/L   Chloride 109  96 - 112 mEq/L   BUN 22  6 - 23 mg/dL   Creatinine, Ser 4.40  0.50 - 1.35 mg/dL   Glucose, Bld 88  70 - 99 mg/dL   Calcium, Ion 1.02  7.25 - 1.23 mmol/L   TCO2 22  0 - 100 mmol/L   Hemoglobin 10.9 (*)  13.0 - 17.0 g/dL   HCT 16.1 (*) 09.6 - 04.5 %  VITAMIN B12     Status: None   Collection Time    10/10/12  1:05 AM      Result Value Range   Vitamin B-12 388  211 - 911 pg/mL  FOLATE     Status: None   Collection Time    10/10/12  1:05 AM      Result Value Range   Folate 11.0     Comment: (NOTE)     Reference Ranges            Deficient:       0.4 - 3.3 ng/mL            Indeterminate:   3.4 - 5.4 ng/mL            Normal:              > 5.4 ng/mL  IRON AND TIBC     Status: None   Collection Time    10/10/12  1:05 AM      Result Value Range   Iron 106  42 - 135 ug/dL   TIBC 409  811 - 914 ug/dL   Saturation Ratios 42  20 - 55 %   UIBC 149  125 - 400 ug/dL  FERRITIN     Status: None   Collection Time    10/10/12  1:05 AM      Result Value Range   Ferritin 47  22 - 322 ng/mL  RETICULOCYTES     Status: Abnormal   Collection Time    10/10/12  1:05 AM      Result Value Range   Retic Ct Pct 1.5  0.4 - 3.1 %   RBC. 3.38 (*) 4.22 - 5.81 MIL/uL   Retic Count, Manual 50.7  19.0 - 186.0 K/uL  URINE RAPID DRUG SCREEN (HOSP PERFORMED)     Status: Abnormal   Collection Time    10/10/12  3:38 AM      Result Value Range   Opiates NONE DETECTED  NONE DETECTED   Cocaine POSITIVE (*) NONE DETECTED   Benzodiazepines NONE DETECTED  NONE DETECTED   Amphetamines NONE DETECTED  NONE DETECTED   Tetrahydrocannabinol POSITIVE (*) NONE DETECTED   Barbiturates  NONE DETECTED  NONE DETECTED   Comment:            DRUG SCREEN FOR MEDICAL PURPOSES     ONLY.  IF CONFIRMATION IS NEEDED     FOR ANY PURPOSE, NOTIFY LAB     WITHIN 5 DAYS.                LOWEST DETECTABLE LIMITS     FOR URINE DRUG SCREEN     Drug Class       Cutoff (ng/mL)     Amphetamine      1000     Barbiturate      200     Benzodiazepine   200     Tricyclics       300     Opiates          300     Cocaine          300     THC              50  BASIC METABOLIC PANEL     Status: Abnormal   Collection Time    10/10/12  6:19 AM  Result Value Range   Sodium 138  135 - 145 mEq/L   Potassium 4.6  3.5 - 5.1 mEq/L   Chloride 109  96 - 112 mEq/L   CO2 22  19 - 32 mEq/L   Glucose, Bld 88  70 - 99 mg/dL   BUN 17  6 - 23 mg/dL   Creatinine, Ser 9.14  0.50 - 1.35 mg/dL   Calcium 8.6  8.4 - 78.2 mg/dL   GFR calc non Af Amer 75 (*) >90 mL/min   GFR calc Af Amer 86 (*) >90 mL/min   Comment:            The eGFR has been calculated     using the CKD EPI equation.     This calculation has not been     validated in all clinical     situations.     eGFR's persistently     <90 mL/min signify     possible Chronic Kidney Disease.  CBC     Status: Abnormal   Collection Time    10/10/12  6:19 AM      Result Value Range   WBC 3.7 (*) 4.0 - 10.5 K/uL   RBC 3.66 (*) 4.22 - 5.81 MIL/uL   Hemoglobin 11.6 (*) 13.0 - 17.0 g/dL   HCT 95.6 (*) 21.3 - 08.6 %   MCV 90.4  78.0 - 100.0 fL   MCH 31.7  26.0 - 34.0 pg   MCHC 35.0  30.0 - 36.0 g/dL   RDW 57.8  46.9 - 62.9 %   Platelets 227  150 - 400 K/uL    Dg Finger Index Left  10/09/2012  *RADIOLOGY REPORT*  Clinical Data: Left index finger laceration, pain, and swelling.  LEFT INDEX FINGER 2+V  Comparison: None.  Findings: Soft tissue swelling and subcutaneous emphysema is seen in the distal index finger.  No evidence of fracture or dislocation.  No other bone abnormality identified.  IMPRESSION: Distal index finger soft tissue swelling and  subcutaneous emphysema.  No fracture identified.   Original Report Authenticated By: Myles Rosenthal, M.D.     Pertinent items are noted in HPI. Temp:  [98 F (36.7 C)-98.3 F (36.8 C)] 98 F (36.7 C) (04/13 0622) Pulse Rate:  [73-85] 73 (04/13 0622) Resp:  [16-20] 16 (04/13 0622) BP: (107-111)/(65-83) 111/65 mmHg (04/13 0217) SpO2:  [95 %-100 %] 100 % (04/13 0622) Weight:  [85.5 kg (188 lb 7.9 oz)] 85.5 kg (188 lb 7.9 oz) (04/13 0217) General appearance: alert and cooperative Resp: clear to auscultation bilaterally Cardio: regular rate and rhythm GI: soft, non-tender; bowel sounds normal; no masses,  no organomegaly Extremities: extremities normal, atraumatic, no cyanosis or edema and edema except for LIF with superficial skin loss over dorsal middle phalanx, purulent material from beneath nail.   Assessment/Plan: Abscess, infection LIF Plan: Will i&d, remove nail, doubt tenosynovitis.  Andrianna Manalang CHRISTOPHER 10/10/2012, 12:59 PM

## 2012-10-10 NOTE — Op Note (Signed)
*   No surgery found *  1:04 PM  PATIENT:  Jonathan Meadows  42 y.o. male  PRE-OPERATIVE DIAGNOSIS:  Infected LIF  POST-OPERATIVE DIAGNOSIS: same  PROCEDURE:   I&D, removal nail plate LIF  SURGEON:  Onesti Bonfiglio  ANESTHESIA:   local  SPECIMEN:  No Specimen  FINDINGS:  Purulent material beneath nail, skin debrided, ? Deep infection  DISPOSITION OF SPECIMEN:  {SPECIMEN DISPOSITION:[id number]  PATIENT DISPOSITION:  tolerated procedure well

## 2012-10-10 NOTE — Progress Notes (Signed)
[facility] PROGRESS NOTE  Jonathan Meadows JXB:[id number] DOB: 01-14-1971 DOA: 10/09/2012 PCP: No PCP Per Patient  Assessment/Plan: 1. Left index finger with pain and full skin thickness wound - with purulent base - ? Etiology - could be infectious but also could be from arterial ischemia (e.g. Buerger disease)  or autoimmune cause. Dr. Izora Ribas will evaluate . Continue antibiotics for now 2. 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 Await venous dopplers. If no DVT will do ACE wraps. 3. Cocaine abuse   Consultants:  Dr. Izora Ribas - Hand surgery   Procedures:    Antibiotics: Vancomycin    HPI/Subjective: C/o finger pain   Objective: Filed Vitals:   10/09/12 2202 10/10/12 0217 10/10/12 0622  BP: 107/83 111/65   Pulse: 74 85 73  Temp: 98 F (36.7 C) 98.3 F (36.8 C) 98 F (36.7 C)  TempSrc: Oral  Oral  Resp: 20 18 16   Height:  6\' 1"  (1.854 m)   Weight:  85.5 kg (188 lb 7.9 oz)   SpO2: 95% 100% 100%   Patient Vitals for the past 24 hrs:  BP Temp Temp src Pulse Resp SpO2 Height Weight  10/10/12 0622 - 98 F (36.7 C) Oral 73 16 100 % - -  10/10/12 0217 111/65 mmHg 98.3 F (36.8 C) - 85 18 100 % 6\' 1"  (1.854 m) 85.5 kg (188 lb 7.9 oz)  10/09/12 2202 107/83 mmHg 98 F (36.7 C) Oral 74 20 95 % - -     Intake/Output Summary (Last 24 hours) at 10/10/12 0749 Last data filed at 10/10/12 1308  Gross per 24 hour  Intake      0 ml  Output    300 ml  Net   -300 ml   Filed Weights   10/10/12 0217  Weight: 85.5 kg (188 lb 7.9 oz)    Exam:   General:  axox3  Cardiovascular: rrr  Respiratory: ctab  Abdomen: soft, nt RLE with edema , seems chronic Left index finger with open wound Data Reviewed: Basic Metabolic Panel:  Recent Labs Lab 10/09/12 2330 10/10/12 0619  NA 139 138  K 4.7 4.6  CL 109 109  CO2  --  22  GLUCOSE 88 88  BUN 22 17  CREATININE 1.10 1.18  CALCIUM  --  8.6   Liver Function Tests: No results  found for this basename: AST, ALT, ALKPHOS, BILITOT, PROT, ALBUMIN,  in the last 168 hours No results found for this basename: LIPASE, AMYLASE,  in the last 168 hours No results found for this basename: AMMONIA,  in the last 168 hours CBC:  Recent Labs Lab 10/09/12 2310 10/09/12 2330 10/10/12 0619  WBC 4.0  --  3.7*  NEUTROABS 1.6*  --   --   HGB 11.0* 10.9* 11.6*  HCT 30.4* 32.0* 33.1*  MCV 89.4  --  90.4  PLT 241  --  227   Cardiac Enzymes:  Recent Labs Lab 10/09/12 2319  CKTOTAL 381*   BNP (last 3 results) No results found for this basename: PROBNP,  in the last 8760 hours CBG: No results found for this basename: GLUCAP,  in the last 168 hours  No results found for this or any previous visit (from the past 240 hour(s)).   Studies: Dg Finger Index Left  10/09/2012  *RADIOLOGY REPORT*  Clinical Data: Left index finger laceration, pain, and swelling.  LEFT INDEX FINGER 2+V  Comparison: None.  Findings: Soft tissue  swelling and subcutaneous emphysema is seen in the distal index finger.  No evidence of fracture or dislocation.  No other bone abnormality identified.  IMPRESSION: Distal index finger soft tissue swelling and subcutaneous emphysema.  No fracture identified.   Original Report Authenticated By: Myles Rosenthal, M.D.     Scheduled Meds: . vancomycin  1,000 mg Intravenous Q12H   Continuous Infusions: . sodium chloride      Principal Problem:   Cellulitis and abscess of finger Active Problems:   Edema of right lower extremity   Anemia        Jonathan Meadows  [facility] Pager [phone]. If 7PM-7AM, please contact night-coverage at [email or web address], password [email or web address] 10/10/2012, 7:49 AM  LOS: 1 day

## 2012-10-10 NOTE — Progress Notes (Signed)
Utilization review completed.  

## 2012-10-11 MED ORDER — SULFAMETHOXAZOLE-TRIMETHOPRIM 800-160 MG PO TABS: 1.0000 | ORAL_TABLET | Freq: Two times a day (BID) | ORAL | Status: DC

## 2012-10-11 MED ORDER — OXYCODONE-ACETAMINOPHEN 5-325 MG PO TABS: 2.0000 | ORAL_TABLET | ORAL | Status: DC | PRN

## 2012-10-11 MED ORDER — BACITRACIN-NEOMYCIN-POLYMYXIN OINTMENT TUBE: 1.0000 "application " | TOPICAL_OINTMENT | Freq: Every day | CUTANEOUS | Status: DC

## 2012-10-11 NOTE — Care Management Note (Signed)
CARE MANAGEMENT NOTE 10/11/2012  Patient:  Jonathan Meadows, Jonathan Meadows   Account Number:  [id number]  Date Initiated:  10/11/2012  Documentation initiated by:  Vance Peper  Subjective/Objective Assessment:   42 yr old male admitted for cellulitis of left index finger, s/p I &D     Action/Plan:   [facility]12 at 3:00pm  [address], [place], [place] [address]  [phone]   Anticipated DC Date:  10/13/2012   Anticipated DC Plan:  HOME/SELF CARE      DC Planning Services  CM consult  Follow-up appt scheduled      PAC Choice  NA   Choice offered to / List presented to:          [facility] arranged  NA      Status of service:  Completed, signed off Medicare Important Message given?   (If response is "NO", the following Medicare IM given date fields will be blank) Date Medicare IM given:   Date Additional Medicare IM given:    Discharge Disposition:  HOME/SELF CARE  Per UR Regulation:    If discussed at Long Length of Stay Meetings, dates discussed:    Comments:

## 2012-10-11 NOTE — Progress Notes (Signed)
Spoke to Dr. Kristine Linea and advised that patient is clear to go home with oral antibiotics, wash wound with soap/water apply antibiotics ointment, non stick gauze, and keep covered. Follow up with Dr. Kristine Linea as needed.

## 2012-10-11 NOTE — Discharge Summary (Signed)
Physician Discharge Summary  Jonathan Meadows ZOX:[id number] DOB: 06/23/1971 DOA: 10/09/2012  PCP: none  Admit date: 10/09/2012 Discharge date: 10/11/2012  Time spent: 35 minutes  Recommendations for Outpatient Follow-up:  1. followup the wound on the left index finger 2. Arrange followup with surgeon to remove fatty tumor on the thigh   Discharge Diagnoses:  Cellulitis and abscess of finger status post incision and debridement   Edema of right lower extremity - chronic-no signs of DVT-may be lymphedema   Anemia Cocaine abuse  Discharge Condition: good  Diet recommendation: regular  Filed Weights   10/10/12 0217  Weight: 85.5 kg (188 lb 7.9 oz)    History of present illness:   Jonathan Meadows is a 42 y.o. male who presents to the ED with complaints of worsening pain,redness, and swelling of his Left Index Finger over the past 2 weeks. He had been seen in the ED and placed on Keflex therapy but the infection continued to worsen. He also has complaints of swelling in the right lower leg x 2 weeks. He denies any trauma to the area.   Hospital Course:  1. Left index finger infection-chronic-patient was admitted, placed on IV vancomycin, taken to the operating room where he underwent incision and debridement and removal of the nail bed. He did not have deep space infection. Plan is to continue oral antibiotic with Septra double strength and local antibiotic ointment bacitracin. Dressing changes daily and followup in the urgent care center in one week 2. 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 Needs outpatient followup with surgery  Procedures: Incision and debridement of the left index finger infection Consultations:  Dr. Izora Ribas  Discharge Exam: Filed Vitals:   10/10/12 0622 10/10/12 1400 10/10/12 2028 10/11/12 0636  BP:  112/72 143/94 138/93   Pulse: 73 62 78 70  Temp: 98 F (36.7 C) 97.9 F (36.6 C) 98.1 F (36.7 C) 98 F (36.7 C)  TempSrc: Oral Oral Oral Oral  Resp: 16 20 18 16   Height:      Weight:      SpO2: 100% 100% 100% 100%    General: axox3 Cardiovascular: rr Respiratory: ctab  Discharge Instructions  Discharge Orders   Future Orders Complete By Expires     Increase activity slowly  As directed         Medication List    STOP taking these medications       cephALEXin 500 MG capsule  Commonly known as:  KEFLEX      TAKE these medications       aspirin 325 MG tablet  Take 325-650 mg by mouth every 6 (six) hours as needed for pain.     ibuprofen 200 MG tablet  Commonly known as:  ADVIL,MOTRIN  Take 400 mg by mouth every 6 (six) hours as needed for pain.     neomycin-bacitracin-polymyxin Oint  Commonly known as:  NEOSPORIN  Apply 1 application topically daily.     oxyCODONE-acetaminophen 5-325 MG per tablet  Commonly known as:  PERCOCET/ROXICET  Take 2 tablets by mouth every 4 (four) hours as needed for pain.     sulfamethoxazole-trimethoprim 800-160 MG per tablet  Commonly known as:  BACTRIM DS  Take 1 tablet by mouth 2 (two) times daily.           Follow-up Information   Please follow up.   Contact information:  [facility] 10/18/12 at 3:00pm   [address] [place], [place] [address]   [phone]          The results of significant diagnostics from this hospitalization (including imaging, microbiology, ancillary and laboratory) are listed below for reference.    Significant Diagnostic Studies: Dg Finger Index Left  10/09/2012  *RADIOLOGY REPORT*  Clinical Data: Left index finger laceration, pain, and swelling.  LEFT INDEX FINGER 2+V  Comparison: None.  Findings: Soft tissue swelling and subcutaneous emphysema is seen in the distal index finger.  No evidence of fracture or dislocation.  No other bone abnormality identified.  IMPRESSION: Distal index finger soft  tissue swelling and subcutaneous emphysema.  No fracture identified.   Original Report Authenticated By: Jonathan Rosenthal, M.D.     Microbiology: No results found for this or any previous visit (from the past 240 hour(s)).   Labs: Basic Metabolic Panel:  Recent Labs Lab 10/09/12 2330 10/10/12 0619  NA 139 138  K 4.7 4.6  CL 109 109  CO2  --  22  GLUCOSE 88 88  BUN 22 17  CREATININE 1.10 1.18  CALCIUM  --  8.6   Liver Function Tests: No results found for this basename: AST, ALT, ALKPHOS, BILITOT, PROT, ALBUMIN,  in the last 168 hours No results found for this basename: LIPASE, AMYLASE,  in the last 168 hours No results found for this basename: AMMONIA,  in the last 168 hours CBC:  Recent Labs Lab 10/09/12 2310 10/09/12 2330 10/10/12 0619  WBC 4.0  --  3.7*  NEUTROABS 1.6*  --   --   HGB 11.0* 10.9* 11.6*  HCT 30.4* 32.0* 33.1*  MCV 89.4  --  90.4  PLT 241  --  227   Cardiac Enzymes:  Recent Labs Lab 10/09/12 2319  CKTOTAL 381*   BNP: BNP (last 3 results) No results found for this basename: PROBNP,  in the last 8760 hours CBG: No results found for this basename: GLUCAP,  in the last 168 hours     Signed:  Josalin Carneiro  [facility] 10/11/2012, 2:01 PM

## 2012-10-11 NOTE — Progress Notes (Signed)
[facility] PROGRESS NOTE  Jonathan Meadows ZOX:[id number] DOB: May 19, 1971 DOA: 10/09/2012 PCP: No PCP Per Patient  Assessment/Plan: 1. Left index finger with pain and full skin thickness wound - with purulent base - ? Etiology - could be infectious but also could be from arterial ischemia (e.g. Buerger disease)  or autoimmune cause. S/p I and D on 10/11/12 . Placed on iv Vancomycin on admission . Await final recs from Dr. Izora Ribas  2. Chronic RL edema - according to patient he has "a fatty tumor that is causing lymphedema" . Venous doppler on 4/13 -  no DVT.  ACE wraps per OT 3. Cocaine abuse   Consultants:  Dr. Izora Ribas - Hand surgery   Procedures:  I and D left index finger   Antibiotics: Vancomycin    HPI/Subjective: Still with  finger pain   Objective: Filed Vitals:   10/10/12 0622 10/10/12 1400 10/10/12 2028 10/11/12 0636  BP:  112/72 143/94 138/93  Pulse: 73 62 78 70  Temp: 98 F (36.7 C) 97.9 F (36.6 C) 98.1 F (36.7 C) 98 F (36.7 C)  TempSrc: Oral Oral Oral Oral  Resp: 16 20 18 16   Height:      Weight:      SpO2: 100% 100% 100% 100%   Patient Vitals for the past 24 hrs:  BP Temp Temp src Pulse Resp SpO2  10/11/12 0636 138/93 mmHg 98 F (36.7 C) Oral 70 16 100 %  10/10/12 2028 143/94 mmHg 98.1 F (36.7 C) Oral 78 18 100 %  10/10/12 1400 112/72 mmHg 97.9 F (36.6 C) Oral 62 20 100 %     Intake/Output Summary (Last 24 hours) at 10/11/12 0813 Last data filed at 10/10/12 2322  Gross per 24 hour  Intake   1040 ml  Output      0 ml  Net   1040 ml   Filed Weights   10/10/12 0217  Weight: 85.5 kg (188 lb 7.9 oz)    Exam:   General:  axox3  Cardiovascular: rrr  Respiratory: ctab  Abdomen: soft, nt RLE with edema , seems chronic Left index finger in bandage   Data Reviewed: Basic Metabolic Panel:  Recent Labs Lab 10/09/12 2330 10/10/12 0619  NA 139 138  K 4.7 4.6  CL 109 109  CO2  --  22  GLUCOSE 88 88  BUN 22 17  CREATININE 1.10 1.18   CALCIUM  --  8.6   Liver Function Tests: No results found for this basename: AST, ALT, ALKPHOS, BILITOT, PROT, ALBUMIN,  in the last 168 hours No results found for this basename: LIPASE, AMYLASE,  in the last 168 hours No results found for this basename: AMMONIA,  in the last 168 hours CBC:  Recent Labs Lab 10/09/12 2310 10/09/12 2330 10/10/12 0619  WBC 4.0  --  3.7*  NEUTROABS 1.6*  --   --   HGB 11.0* 10.9* 11.6*  HCT 30.4* 32.0* 33.1*  MCV 89.4  --  90.4  PLT 241  --  227   Cardiac Enzymes:  Recent Labs Lab 10/09/12 2319  CKTOTAL 381*   BNP (last 3 results) No results found for this basename: PROBNP,  in the last 8760 hours CBG: No results found for this basename: GLUCAP,  in the last 168 hours  No results found for this or any previous visit (from the past 240 hour(s)).   Studies: Dg Finger Index Left  10/09/2012  *RADIOLOGY REPORT*  Clinical Data: Left index finger laceration, pain,  and swelling.  LEFT INDEX FINGER 2+V  Comparison: None.  Findings: Soft tissue swelling and subcutaneous emphysema is seen in the distal index finger.  No evidence of fracture or dislocation.  No other bone abnormality identified.  IMPRESSION: Distal index finger soft tissue swelling and subcutaneous emphysema.  No fracture identified.   Original Report Authenticated By: Myles Rosenthal, M.D.     Scheduled Meds: . neomycin-bacitracin-polymyxin   Topical Daily  . vancomycin  1,000 mg Intravenous Q12H   Continuous Infusions: . sodium chloride      Principal Problem:   Cellulitis and abscess of finger Active Problems:   Edema of right lower extremity   Anemia        Jonathan Meadows  [facility] Pager [phone]. If 7PM-7AM, please contact night-coverage at [email or web address], password [facility] 10/11/2012, 8:13 AM  LOS: 2 days

## 2012-10-13 ENCOUNTER — Telehealth (HOSPITAL_COMMUNITY): Payer: Self-pay | Admitting: Emergency Medicine

## 2013-02-23 ENCOUNTER — Encounter (HOSPITAL_COMMUNITY): Payer: Self-pay | Admitting: Emergency Medicine

## 2013-02-23 ENCOUNTER — Emergency Department (HOSPITAL_COMMUNITY)
Admission: EM | Admit: 2013-02-23 | Discharge: 2013-02-23 | Disposition: A | Discharge status: Home / Self Care | Payer: Self-pay | Attending: Emergency Medicine | Admitting: Emergency Medicine

## 2013-02-23 ENCOUNTER — Ambulatory Visit (HOSPITAL_COMMUNITY): Admission: RE | Admit: 2013-02-23 | Payer: Self-pay | Source: Ambulatory Visit

## 2013-02-23 DIAGNOSIS — M7989 Other specified soft tissue disorders: Secondary | ICD-10-CM | POA: Insufficient documentation

## 2013-02-23 DIAGNOSIS — Z8659 Personal history of other mental and behavioral disorders: Secondary | ICD-10-CM | POA: Insufficient documentation

## 2013-02-23 DIAGNOSIS — Z872 Personal history of diseases of the skin and subcutaneous tissue: Secondary | ICD-10-CM | POA: Insufficient documentation

## 2013-02-23 DIAGNOSIS — Z862 Personal history of diseases of the blood and blood-forming organs and certain disorders involving the immune mechanism: Secondary | ICD-10-CM | POA: Insufficient documentation

## 2013-02-23 HISTORY — DX: Cellulitis, unspecified: L03.90

## 2013-02-23 HISTORY — DX: Paranoid schizophrenia: F20.0

## 2013-02-23 MED ORDER — ENOXAPARIN SODIUM 100 MG/ML ~~LOC~~ SOLN
1.0000 mg/kg | Freq: Once | SUBCUTANEOUS | Status: AC
  Administered 2013-02-23: 80 mg via SUBCUTANEOUS
  Filled 2013-02-23: qty 1

## 2013-02-23 MED ORDER — OXYCODONE-ACETAMINOPHEN 5-325 MG PO TABS
1.0000 | ORAL_TABLET | Freq: Once | ORAL | Status: AC
  Administered 2013-02-23: 1 via ORAL
  Filled 2013-02-23: qty 1

## 2013-02-23 NOTE — ED Provider Notes (Signed)
CSN: [id number]     Arrival date & time 02/23/13  0128 History   First MD Initiated Contact with Patient 02/23/13 0242     Chief Complaint  Patient presents with  . Leg Swelling   (Consider location/radiation/quality/duration/timing/severity/associated sxs/prior Treatment) HPI Patient reports worsening swelling of his right lower extremity over the past several days.  He denies numbness tingling or weakness.  Review the chart demonstrates has a right lower extremity edema before in the past with negative duplex ultrasounds.  He denies chest pain shortness of breath.  No fevers or chills.  No new or recent trauma.  His symptoms are mild to moderate in severity.  Past Medical History  Diagnosis Date  . Anemia   . Paranoid schizophrenia   . Cellulitis    History reviewed. No pertinent past surgical history. Family History  Problem Relation Age of Onset  . CAD      Neg HX  . Hypertension      Neg Hx  . Diabetes      Neg Hx  . Cancer      Neg Hx   History  Substance Use Topics  . Smoking status: Never Smoker   . Smokeless tobacco: Not on file  . Alcohol Use: No    Review of Systems  All other systems reviewed and are negative.    Allergies  Review of patient's allergies indicates no known allergies.  Home Medications  No current outpatient prescriptions on file. BP 99/69  Pulse 68  Temp(Src) 97.6 F (36.4 C) (Oral)  Ht 6\' 1"  (1.854 m)  Wt 180 lb (81.647 kg)  BMI 23.75 kg/m2  SpO2 100% Physical Exam  Nursing note and vitals reviewed. Constitutional: He is oriented to person, place, and time. He appears well-developed and well-nourished.  HENT:  Head: Normocephalic and atraumatic.  Eyes: EOM are normal.  Neck: Normal range of motion.  Cardiovascular: Normal rate, regular rhythm, normal heart sounds and intact distal pulses.   Pulmonary/Chest: Effort normal and breath sounds normal. No respiratory distress.  Abdominal: Soft. He exhibits no distension. There is  no tenderness.  Genitourinary: Rectum normal.  Musculoskeletal: Normal range of motion.  Right lower extremity swelling and edema as compared to left.  The majority right lower extremity swelling appears to be chronic in nature as he's developed overlying skin changes.  He has normal pulses in his right foot.  Normal flexion and extension to his ankle.  No pain with passive range of motion.  Full range of motion of the right knee and right hip.  No erythema or warmth of the right lower extremity.  Neurological: He is alert and oriented to person, place, and time.  Skin: Skin is warm and dry.  Psychiatric: He has a normal mood and affect. Judgment normal.    ED Course  Procedures (including critical care time) Labs Review Labs Reviewed - No data to display Imaging Review No results found.  MDM   1. Swelling of right lower extremity    Facial need an ultrasound of his right lower extremity to evaluate for DVT.  Outpatient order for later this morning.  Sig of his Lovenox in emergency department.  Pain treated in ER.  Home with anti-inflammatories.  I suspect this is a chronic issue for the patient.  Shortly after arriving he began asking for sandwiches and something to drink.  He has a suit case with him in the emergency department    Lyanne Co, MD 02/23/13 [phone]

## 2013-02-23 NOTE — ED Notes (Signed)
Pt. Placed in a wheelchair, given 2 warm blankets, and a pillow.  Pt. Was wheeled to the waiting room.

## 2013-02-23 NOTE — ED Notes (Signed)
Pt. Noted coming to the desk and yelling at the nurses to bring him something to eat now.  Pt. Was told that it would be brought to him and pt. Began yelling that he wanted it now and wanted to speak to the doctor.  Security was called to speak with pt.

## 2013-02-23 NOTE — ED Notes (Signed)
Pt presents to the ED with RLL swelling and tightness. Pt states that right leg has been swollen for "several days". Reports pain 8/10 at this time. Pt is homeless and does not have PCP. Pedal pulse +1 on right foot.

## 2013-02-23 NOTE — ED Notes (Signed)
PT. REPORTS  PERSISTENT RIGHT LEG EDEMA/SWELLING WITH PAIN FOR SEVERAL DAYS . AMBULATORY.

## 2013-02-24 DIAGNOSIS — Z5900 Homelessness unspecified: Secondary | ICD-10-CM | POA: Insufficient documentation

## 2013-02-24 DIAGNOSIS — IMO0002 Reserved for concepts with insufficient information to code with codable children: Secondary | ICD-10-CM | POA: Insufficient documentation

## 2013-02-24 DIAGNOSIS — M25529 Pain in unspecified elbow: Secondary | ICD-10-CM | POA: Insufficient documentation

## 2013-02-24 DIAGNOSIS — R21 Rash and other nonspecific skin eruption: Secondary | ICD-10-CM | POA: Insufficient documentation

## 2013-02-24 DIAGNOSIS — R197 Diarrhea, unspecified: Secondary | ICD-10-CM | POA: Insufficient documentation

## 2013-02-24 DIAGNOSIS — K5900 Constipation, unspecified: Secondary | ICD-10-CM | POA: Insufficient documentation

## 2013-02-24 DIAGNOSIS — D649 Anemia, unspecified: Secondary | ICD-10-CM | POA: Insufficient documentation

## 2013-02-24 DIAGNOSIS — F200 Paranoid schizophrenia: Secondary | ICD-10-CM | POA: Insufficient documentation

## 2013-02-24 DIAGNOSIS — F919 Conduct disorder, unspecified: Secondary | ICD-10-CM | POA: Insufficient documentation

## 2013-02-25 ENCOUNTER — Emergency Department (HOSPITAL_COMMUNITY)
Admission: EM | Admit: 2013-02-25 | Discharge: 2013-02-25 | Disposition: A | Discharge status: Home / Self Care | Payer: Self-pay | Attending: Emergency Medicine | Admitting: Emergency Medicine

## 2013-02-25 ENCOUNTER — Encounter (HOSPITAL_COMMUNITY): Payer: Self-pay | Admitting: Emergency Medicine

## 2013-02-25 DIAGNOSIS — Z590 Homelessness: Secondary | ICD-10-CM

## 2013-02-25 HISTORY — DX: Homelessness: Z59.0

## 2013-02-25 HISTORY — DX: Homelessness unspecified: Z59.00

## 2013-02-25 LAB — COMPREHENSIVE METABOLIC PANEL
AST: 35 U/L (ref 0–37)
Albumin: 3.4 g/dL — ABNORMAL LOW (ref 3.5–5.2)
BUN: 22 mg/dL (ref 6–23)
Calcium: 9.1 mg/dL (ref 8.4–10.5)
Chloride: 102 mEq/L (ref 96–112)
Creatinine, Ser: 1.54 mg/dL — ABNORMAL HIGH (ref 0.50–1.35)
Total Bilirubin: 0.3 mg/dL (ref 0.3–1.2)

## 2013-02-25 LAB — URINALYSIS, ROUTINE W REFLEX MICROSCOPIC
Glucose, UA: NEGATIVE mg/dL
Ketones, ur: NEGATIVE mg/dL
Leukocytes, UA: NEGATIVE
Nitrite: NEGATIVE
Protein, ur: NEGATIVE mg/dL
pH: 5.5 (ref 5.0–8.0)

## 2013-02-25 LAB — CBC WITH DIFFERENTIAL/PLATELET
Basophils Absolute: 0 10*3/uL (ref 0.0–0.1)
Basophils Relative: 0 % (ref 0–1)
Eosinophils Absolute: 0.1 10*3/uL (ref 0.0–0.7)
Eosinophils Relative: 2 % (ref 0–5)
HCT: 35.8 % — ABNORMAL LOW (ref 39.0–52.0)
Hemoglobin: 12.3 g/dL — ABNORMAL LOW (ref 13.0–17.0)
MCH: 32 pg (ref 26.0–34.0)
MCHC: 34.4 g/dL (ref 30.0–36.0)
MCV: 93.2 fL (ref 78.0–100.0)
Monocytes Absolute: 0.5 10*3/uL (ref 0.1–1.0)
Monocytes Relative: 11 % (ref 3–12)
RDW: 13.2 % (ref 11.5–15.5)

## 2013-02-25 LAB — LIPASE, BLOOD: Lipase: 16 U/L (ref 11–59)

## 2013-02-25 NOTE — ED Notes (Signed)
PT. REPORTS MID ABDOMINAL PAIN WITH DIARRHEA FOR SEVERAL DAYS , ALSO REPORTS RIGHT ELBOW PAIN ONSET YESTERDAY , DENIES INJURY / NO SWELLING.

## 2013-02-25 NOTE — ED Provider Notes (Signed)
Medical screening examination/treatment/procedure(s) were performed by non-physician practitioner and as supervising physician I was immediately available for consultation/collaboration.  Seith Aikey, MD 02/25/13 0527 

## 2013-02-25 NOTE — ED Notes (Signed)
RN went into pt's room to discharge pt, pt not in room. RN walked to waiting room to see if pt was waiting. Pt not in waiting room. RN walked outside and checked the bus stop, pt was not there. Primary RN Ed W. notified that pt was not in room to be discharged and did not receive his discharge papers.

## 2013-02-25 NOTE — ED Provider Notes (Signed)
CSN: [id number]     Arrival date & time 02/24/13  2347 History   First MD Initiated Contact with Patient 02/25/13 0309     Chief Complaint  Patient presents with  . Abdominal Pain   (Consider location/radiation/quality/duration/timing/severity/associated sxs/prior Treatment) HPI Comments: Patient presents with suitcase in hand be abdominal symptoms, stating first yes constipation, and correct himself, stating he has no diarrhea.  There is elbow hurts from an old injury, but refuses to elaborate on the injury.  Patient is very vague, hostile, requesting food  Patient is a 42 y.o. male presenting with abdominal pain. The history is provided by the patient.  Abdominal Pain Pain location:  Generalized Pain quality: dull   Pain severity:  Mild Onset quality:  Unable to specify Duration:  2 days Timing:  Intermittent Progression:  Unable to specify Context: not retching   Relieved by:  None tried Worsened by:  Nothing tried Ineffective treatments:  None tried Associated symptoms: constipation and diarrhea   Associated symptoms: no chest pain, no fever, no flatus, no nausea and no vomiting   Diarrhea:    Quality:  Unable to specify   Severity:  Unable to specify   Duration:  2 days   Timing:  Unable to specify   Progression:  Unable to specify   Past Medical History  Diagnosis Date  . Anemia   . Paranoid schizophrenia   . Cellulitis   . Homelessness    History reviewed. No pertinent past surgical history. Family History  Problem Relation Age of Onset  . CAD      Neg HX  . Hypertension      Neg Hx  . Diabetes      Neg Hx  . Cancer      Neg Hx   History  Substance Use Topics  . Smoking status: Never Smoker   . Smokeless tobacco: Not on file  . Alcohol Use: No    Review of Systems  Constitutional: Negative for fever, activity change and appetite change.  Cardiovascular: Negative for chest pain.  Gastrointestinal: Positive for abdominal pain, diarrhea and  constipation. Negative for nausea, vomiting and flatus.  Musculoskeletal: Negative for joint swelling.  Skin: Positive for wound.  Neurological: Negative for dizziness and headaches.    Allergies  Review of patient's allergies indicates no known allergies.  Home Medications  No current outpatient prescriptions on file. BP 107/73  Temp(Src) 98.2 F (36.8 C) (Oral)  Resp 16  SpO2 98% Physical Exam  Nursing note and vitals reviewed. Constitutional: He is oriented to person, place, and time. He appears well-developed and well-nourished.  Patient no acute distress, had to be awakened for examination  HENT:  Head: Normocephalic and atraumatic.  Eyes: Pupils are equal, round, and reactive to light.  Neck: Normal range of motion.  Cardiovascular: Normal rate and regular rhythm.   Pulmonary/Chest: Effort normal and breath sounds normal.  Abdominal: Soft. Bowel sounds are normal. He exhibits no distension. There is no tenderness.  Musculoskeletal: Normal range of motion.  Neurological: He is alert and oriented to person, place, and time.  Skin: Skin is warm. Rash noted. No erythema.  Psychiatric: His affect is labile and inappropriate. He is agitated and aggressive. He expresses inappropriate judgment.    ED Course  Procedures (including critical care time) Labs Review Labs Reviewed  CBC WITH DIFFERENTIAL - Abnormal; Notable for the following:    RBC 3.84 (*)    Hemoglobin 12.3 (*)    HCT 35.8 (*)  All other components within normal limits  COMPREHENSIVE METABOLIC PANEL - Abnormal; Notable for the following:    Creatinine, Ser 1.54 (*)    Albumin 3.4 (*)    GFR calc non Af Amer 54 (*)    GFR calc Af Amer 63 (*)    All other components within normal limits  URINALYSIS, ROUTINE W REFLEX MICROSCOPIC - Abnormal; Notable for the following:    APPearance CLOUDY (*)    Specific Gravity, Urine 1.039 (*)    Bilirubin Urine SMALL (*)    All other components within normal limits   LIPASE, BLOOD   Imaging Review No results found.  MDM   1. Homelessness      has had no episodes of nausea, vomiting, or diarrhea, but is continually asked for food and drink.  This appears to be a homeless person, looking for a place to sleep in a meal    Arman Filter, NP 02/25/13 0500

## 2013-03-09 ENCOUNTER — Emergency Department (HOSPITAL_COMMUNITY)
Admission: EM | Admit: 2013-03-09 | Discharge: 2013-03-10 | Disposition: A | Discharge status: Home / Self Care | Payer: Self-pay | Attending: Emergency Medicine | Admitting: Emergency Medicine

## 2013-03-09 ENCOUNTER — Encounter (HOSPITAL_COMMUNITY): Payer: Self-pay

## 2013-03-09 DIAGNOSIS — Z5900 Homelessness unspecified: Secondary | ICD-10-CM | POA: Insufficient documentation

## 2013-03-09 DIAGNOSIS — Z872 Personal history of diseases of the skin and subcutaneous tissue: Secondary | ICD-10-CM | POA: Insufficient documentation

## 2013-03-09 DIAGNOSIS — Z8659 Personal history of other mental and behavioral disorders: Secondary | ICD-10-CM | POA: Insufficient documentation

## 2013-03-09 DIAGNOSIS — M7989 Other specified soft tissue disorders: Secondary | ICD-10-CM | POA: Insufficient documentation

## 2013-03-09 DIAGNOSIS — R197 Diarrhea, unspecified: Secondary | ICD-10-CM | POA: Insufficient documentation

## 2013-03-09 DIAGNOSIS — Z862 Personal history of diseases of the blood and blood-forming organs and certain disorders involving the immune mechanism: Secondary | ICD-10-CM | POA: Insufficient documentation

## 2013-03-09 NOTE — ED Notes (Signed)
Pt presents with multiple complaints, pt reports Right leg pain and swelling for several days. Pt also reports diarrhea for several days. Pt denies N/V or abd pain

## 2013-03-10 ENCOUNTER — Encounter (HOSPITAL_COMMUNITY): Payer: Self-pay | Admitting: Emergency Medicine

## 2013-03-10 ENCOUNTER — Ambulatory Visit (HOSPITAL_COMMUNITY): Admission: RE | Admit: 2013-03-10 | Payer: Self-pay | Source: Ambulatory Visit

## 2013-03-10 DIAGNOSIS — R05 Cough: Secondary | ICD-10-CM | POA: Insufficient documentation

## 2013-03-10 DIAGNOSIS — R609 Edema, unspecified: Secondary | ICD-10-CM | POA: Insufficient documentation

## 2013-03-10 DIAGNOSIS — R1110 Vomiting, unspecified: Secondary | ICD-10-CM | POA: Insufficient documentation

## 2013-03-10 DIAGNOSIS — R197 Diarrhea, unspecified: Secondary | ICD-10-CM | POA: Insufficient documentation

## 2013-03-10 DIAGNOSIS — Z8659 Personal history of other mental and behavioral disorders: Secondary | ICD-10-CM | POA: Insufficient documentation

## 2013-03-10 DIAGNOSIS — Z79899 Other long term (current) drug therapy: Secondary | ICD-10-CM | POA: Insufficient documentation

## 2013-03-10 DIAGNOSIS — Z872 Personal history of diseases of the skin and subcutaneous tissue: Secondary | ICD-10-CM | POA: Insufficient documentation

## 2013-03-10 DIAGNOSIS — Z862 Personal history of diseases of the blood and blood-forming organs and certain disorders involving the immune mechanism: Secondary | ICD-10-CM | POA: Insufficient documentation

## 2013-03-10 DIAGNOSIS — R059 Cough, unspecified: Secondary | ICD-10-CM | POA: Insufficient documentation

## 2013-03-10 MED ORDER — ENOXAPARIN SODIUM 100 MG/ML ~~LOC~~ SOLN
1.0000 mg/kg | Freq: Once | SUBCUTANEOUS | Status: AC
  Administered 2013-03-10: 80 mg via SUBCUTANEOUS
  Filled 2013-03-10: qty 1

## 2013-03-10 NOTE — ED Provider Notes (Signed)
CSN: [id number]     Arrival date & time 03/09/13  2217 History   First MD Initiated Contact with Patient 03/10/13 0007     Chief Complaint  Patient presents with  . Leg Pain    The history is provided by the patient.   patient reports ongoing swelling of his right lower extremity over the past several weeks.  I saw the patient approximately 2 weeks ago and ordered an duplex ultrasound of his right lower extremity which she never received.  He presents now with the same exact complaints.  I stressed to him the importance that he needs to get a vascular study performed.  The patient is homeless.  He states his had some loose stools over the past several days without blood.  He denies nausea and vomiting.  He denies lightheadedness.  No chest pain or abdominal pain.  Symptoms are mild in severity.  Past Medical History  Diagnosis Date  . Anemia   . Paranoid schizophrenia   . Cellulitis   . Homelessness    History reviewed. No pertinent past surgical history. Family History  Problem Relation Age of Onset  . CAD      Neg HX  . Hypertension      Neg Hx  . Diabetes      Neg Hx  . Cancer      Neg Hx   History  Substance Use Topics  . Smoking status: Never Smoker   . Smokeless tobacco: Not on file  . Alcohol Use: No    Review of Systems  All other systems reviewed and are negative.    Allergies  Review of patient's allergies indicates no known allergies.  Home Medications  No current outpatient prescriptions on file. BP 105/62  Pulse 79  Resp 16  Ht 6\' 1"  (1.854 m)  Wt 180 lb (81.647 kg)  BMI 23.75 kg/m2  SpO2 98% Physical Exam  Nursing note and vitals reviewed. Constitutional: He is oriented to person, place, and time. He appears well-developed and well-nourished.  HENT:  Head: Normocephalic and atraumatic.  Eyes: EOM are normal.  Neck: Normal range of motion.  Cardiovascular: Normal rate, regular rhythm, normal heart sounds and intact distal pulses.    Pulmonary/Chest: Effort normal and breath sounds normal. No respiratory distress.  Abdominal: Soft. He exhibits no distension. There is no tenderness.  Genitourinary: Rectum normal.  Musculoskeletal: Normal range of motion.  Chronic appearing swelling of his right lower extremity.  Full range of motion of his major joints.  Normal pulses in his right foot.  Neurological: He is alert and oriented to person, place, and time.  Skin: Skin is warm and dry.  Psychiatric: He has a normal mood and affect. Judgment normal.    ED Course  Procedures (including critical care time) Labs Review Labs Reviewed - No data to display Imaging Review No results found.  MDM   1. Swelling of right lower extremity    I reiterated to the patient and the importance of vascular study in the morning.  Lovenox given.  Patient understands that he is welcome to stay here in the emergency department waiting room until morning time to get his ultrasound performed.    Lyanne Co, MD 03/10/13 [phone]

## 2013-03-10 NOTE — ED Notes (Signed)
Pt. reports emesis /diarrhea with generalized abdominal cramping when vomitting for several days .

## 2013-03-11 ENCOUNTER — Emergency Department (HOSPITAL_COMMUNITY)
Admission: EM | Admit: 2013-03-11 | Discharge: 2013-03-11 | Disposition: A | Discharge status: Home / Self Care | Payer: Self-pay | Attending: Emergency Medicine | Admitting: Emergency Medicine

## 2013-03-11 DIAGNOSIS — R600 Localized edema: Secondary | ICD-10-CM

## 2013-03-11 DIAGNOSIS — R197 Diarrhea, unspecified: Secondary | ICD-10-CM

## 2013-03-11 DIAGNOSIS — R05 Cough: Secondary | ICD-10-CM

## 2013-03-11 LAB — COMPREHENSIVE METABOLIC PANEL
ALT: 14 U/L (ref 0–53)
AST: 27 U/L (ref 0–37)
Albumin: 2.9 g/dL — ABNORMAL LOW (ref 3.5–5.2)
Alkaline Phosphatase: 58 U/L (ref 39–117)
CO2: 26 mEq/L (ref 19–32)
Chloride: 106 mEq/L (ref 96–112)
Potassium: 3.5 mEq/L (ref 3.5–5.1)
Sodium: 141 mEq/L (ref 135–145)
Total Bilirubin: 0.2 mg/dL — ABNORMAL LOW (ref 0.3–1.2)

## 2013-03-11 LAB — CBC WITH DIFFERENTIAL/PLATELET
Basophils Absolute: 0 10*3/uL (ref 0.0–0.1)
Basophils Relative: 1 % (ref 0–1)
Hemoglobin: 11.6 g/dL — ABNORMAL LOW (ref 13.0–17.0)
Lymphocytes Relative: 51 % — ABNORMAL HIGH (ref 12–46)
MCHC: 34.4 g/dL (ref 30.0–36.0)
Neutro Abs: 1.4 10*3/uL — ABNORMAL LOW (ref 1.7–7.7)
Neutrophils Relative %: 34 % — ABNORMAL LOW (ref 43–77)
RDW: 13.8 % (ref 11.5–15.5)
WBC: 4.2 10*3/uL (ref 4.0–10.5)

## 2013-03-11 MED ORDER — ONDANSETRON 8 MG PO TBDP: 8.0000 mg | ORAL_TABLET | Freq: Three times a day (TID) | ORAL | Status: DC | PRN

## 2013-03-11 MED ORDER — LOPERAMIDE HCL 2 MG PO CAPS: 4.0000 mg | ORAL_CAPSULE | ORAL | Status: DC | PRN

## 2013-03-11 MED ORDER — GUAIFENESIN ER 600 MG PO TB12
1200.0000 mg | ORAL_TABLET | Freq: Once | ORAL | Status: AC
  Administered 2013-03-11: 1200 mg via ORAL
  Filled 2013-03-11: qty 2

## 2013-03-11 MED ORDER — GUAIFENESIN ER 600 MG PO TB12: 1200.0000 mg | ORAL_TABLET | Freq: Once | ORAL | Status: DC

## 2013-03-11 MED ORDER — ONDANSETRON 4 MG PO TBDP: 8.0000 mg | ORAL_TABLET | Freq: Once | ORAL | Status: DC

## 2013-03-11 MED ORDER — LOPERAMIDE HCL 2 MG PO CAPS: 2.0000 mg | ORAL_CAPSULE | Freq: Four times a day (QID) | ORAL | Status: DC | PRN

## 2013-03-11 NOTE — ED Provider Notes (Signed)
CSN: [id number]     Arrival date & time 03/10/13  2245 History   First MD Initiated Contact with Patient 03/11/13 0220     Chief Complaint  Patient presents with  . Emesis  . Diarrhea   (Consider location/radiation/quality/duration/timing/severity/associated sxs/prior Treatment) HPI 42 year old male presents emergency department with complaint of cough, allergies, occasional vomiting, and loose stools.  Patient reports he's had these symptoms for the last 3-4 days. Patient was seen last night with complaint of right lower extremity swelling.  He has had this for some time.  He was instructed to followup several times to get a duplex ultrasound of his right leg.  He did not get it completed this morning.  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  He is not interested in pursuing further imaging until he can have the surgery done.  He is requesting TED hose, so that he can help with the swelling of his lower extremity.  Patient does not have a primary care Dr.  Patient is requesting milk, sandwiches, crackers peanut butter, and 3-4 blankets.  Patient does not want to answer any further questions, and requests that I turn the light off. Past Medical History  Diagnosis Date  . Anemia   . Paranoid schizophrenia   . Cellulitis   . Homelessness    History reviewed. No pertinent past surgical history. Family History  Problem Relation Age of Onset  . CAD      Neg HX  . Hypertension      Neg Hx  . Diabetes      Neg Hx  . Cancer      Neg Hx   History  Substance Use Topics  . Smoking status: Never Smoker   . Smokeless tobacco: Not on file  . Alcohol Use: No    Review of Systems  Unable to perform ROS: Other   patient refuses to answer  Allergies  Review of patient's allergies indicates no known allergies.  Home Medications   Current Outpatient Rx  Name  Route  Sig  Dispense  Refill  . guaiFENesin (MUCINEX) 600 MG 12 hr  tablet   Oral   Take 2 tablets (1,200 mg total) by mouth once.   30 tablet   0   . loperamide (IMODIUM) 2 MG capsule   Oral   Take 1 capsule (2 mg total) by mouth 4 (four) times daily as needed for diarrhea or loose stools.   12 capsule   0   . ondansetron (ZOFRAN-ODT) 8 MG disintegrating tablet   Oral   Take 1 tablet (8 mg total) by mouth every 8 (eight) hours as needed for nausea.   20 tablet   0    BP 132/74  Pulse 74  Temp(Src) 98.2 F (36.8 C) (Oral)  Resp 20  SpO2 100% Physical Exam  Nursing note and vitals reviewed. Constitutional: He is oriented to person, place, and time. He appears well-developed and well-nourished. No distress.  HENT:  Head: Normocephalic and atraumatic.  Right Ear: External ear normal.  Left Ear: External ear normal.  Nose: Nose normal.  Mouth/Throat: Oropharynx is clear and moist.  No rhinorrhea noted.  No coughing during my evaluation  Eyes: Conjunctivae and EOM are normal. Pupils are equal, round, and reactive to light.  Neck: Normal range of motion. Neck supple. No JVD present. No tracheal deviation present. No thyromegaly present.  Cardiovascular: Normal rate, regular rhythm, normal heart sounds  and intact distal pulses.  Exam reveals no gallop and no friction rub.   No murmur heard. Pulmonary/Chest: Effort normal and breath sounds normal. No stridor. No respiratory distress. He has no wheezes. He has no rales. He exhibits no tenderness.  Abdominal: Soft. Bowel sounds are normal. He exhibits no distension and no mass. There is no tenderness. There is no rebound and no guarding.  Musculoskeletal: Normal range of motion. He exhibits edema. He exhibits no tenderness.  Chronic swelling, of right lower extremity.  Chronic skin changes noted.  Pulses present.  Range of motion.  Normal  Lymphadenopathy:    He has no cervical adenopathy.  Neurological: He is alert and oriented to person, place, and time. He exhibits normal muscle tone.  Coordination normal.  Skin: Skin is warm and dry. No rash noted. No erythema. No pallor.  Psychiatric: He has a normal mood and affect. His behavior is normal. Judgment and thought content normal.    ED Course  Procedures (including critical care time) Labs Review Labs Reviewed  CBC WITH DIFFERENTIAL - Abnormal; Notable for the following:    RBC 3.60 (*)    Hemoglobin 11.6 (*)    HCT 33.7 (*)    Neutrophils Relative % 34 (*)    Neutro Abs 1.4 (*)    Lymphocytes Relative 51 (*)    All other components within normal limits  COMPREHENSIVE METABOLIC PANEL - Abnormal; Notable for the following:    Albumin 2.9 (*)    Total Bilirubin 0.2 (*)    GFR calc non Af Amer 80 (*)    All other components within normal limits   Imaging Review No results found.  MDM   1. Cough   2. Edema of right lower extremity   3. Diarrhea    42 year old male with complaints of URI, nausea, vomiting, diarrhea, and chronic edema of his right lower extremity.  Patient has eaten, copious amounts here in the emergency department, so clearly his nausea and vomiting.  Has improved.  He has not had any further diarrhea.  Will treat his cough with Mucinex, prescribed Zofran and Imodium when necessary.  Patient again is in strongly encouraged to find a primary care Dr. for further workup of his ongoing symptoms.  He is also again been given the surgery numbers for evaluation of his groin.  Tumor    Olivia Mackie, MD 03/11/13 [phone]

## 2013-03-11 NOTE — ED Notes (Signed)
Pt reports cold, cough, nausea, vomiting, and diarrhea for "some time" but would not give a specific time frame. Pt rates chest wall pain from coughing 10/10. Pt requesting a ginger ale to drink though he states he's still nauseated.

## 2013-05-12 ENCOUNTER — Ambulatory Visit (HOSPITAL_COMMUNITY): Payer: Self-pay | Attending: Emergency Medicine

## 2013-05-12 ENCOUNTER — Emergency Department (HOSPITAL_COMMUNITY)
Admission: EM | Admit: 2013-05-12 | Discharge: 2013-05-12 | Disposition: A | Discharge status: Home / Self Care | Payer: Self-pay | Attending: Emergency Medicine | Admitting: Emergency Medicine

## 2013-05-12 ENCOUNTER — Encounter (HOSPITAL_COMMUNITY): Payer: Self-pay | Admitting: Emergency Medicine

## 2013-05-12 DIAGNOSIS — Z5900 Homelessness unspecified: Secondary | ICD-10-CM | POA: Insufficient documentation

## 2013-05-12 DIAGNOSIS — R609 Edema, unspecified: Secondary | ICD-10-CM | POA: Insufficient documentation

## 2013-05-12 DIAGNOSIS — Z8659 Personal history of other mental and behavioral disorders: Secondary | ICD-10-CM | POA: Insufficient documentation

## 2013-05-12 DIAGNOSIS — M79609 Pain in unspecified limb: Secondary | ICD-10-CM | POA: Insufficient documentation

## 2013-05-12 DIAGNOSIS — R600 Localized edema: Secondary | ICD-10-CM

## 2013-05-12 DIAGNOSIS — Z862 Personal history of diseases of the blood and blood-forming organs and certain disorders involving the immune mechanism: Secondary | ICD-10-CM | POA: Insufficient documentation

## 2013-05-12 DIAGNOSIS — Z872 Personal history of diseases of the skin and subcutaneous tissue: Secondary | ICD-10-CM | POA: Insufficient documentation

## 2013-05-12 MED ORDER — OXYCODONE-ACETAMINOPHEN 5-325 MG PO TABS
2.0000 | ORAL_TABLET | Freq: Once | ORAL | Status: DC
  Filled 2013-05-12: qty 2

## 2013-05-12 NOTE — ED Provider Notes (Signed)
CSN: [id number]     Arrival date & time 05/12/13  0057 History   First MD Initiated Contact with Patient 05/12/13 0457     Chief Complaint  Patient presents with  . Leg Swelling   (Consider location/radiation/quality/duration/timing/severity/associated sxs/prior Treatment) HPI History provided by patient. Has history of schizophrenia and chronic right lower extremity edema. Patient presents tonight with right lower extremity pain and swelling that he feels like is getting worse. No fevers or chills. No weakness or numbness. He was previously followed by helps her but currently does not have a primary care physician. He was admitted about 8 months ago with these symptoms and had Percocet at that time. Is requesting Percocet tonight. Symptoms moderate in severity. Patient is homeless. No chest pain or shortness of breath Past Medical History  Diagnosis Date  . Anemia   . Paranoid schizophrenia   . Cellulitis   . Homelessness    History reviewed. No pertinent past surgical history. Family History  Problem Relation Age of Onset  . CAD      Neg HX  . Hypertension      Neg Hx  . Diabetes      Neg Hx  . Cancer      Neg Hx   History  Substance Use Topics  . Smoking status: Never Smoker   . Smokeless tobacco: Not on file  . Alcohol Use: No    Review of Systems  Constitutional: Negative for fever and chills.  Eyes: Negative for pain.  Respiratory: Negative for shortness of breath.   Cardiovascular: Positive for leg swelling. Negative for chest pain.  Gastrointestinal: Negative for abdominal pain.  Genitourinary: Negative for dysuria.  Musculoskeletal: Negative for back pain, neck pain and neck stiffness.  Skin: Negative for rash.  Neurological: Negative for headaches.  All other systems reviewed and are negative.    Allergies  Review of patient's allergies indicates no known allergies.  Home Medications  No current outpatient prescriptions on file. BP 110/63  Pulse 77   Temp(Src) 97.4 F (36.3 C) (Oral)  Resp 12  Wt 197 lb (89.359 kg)  SpO2 95% Physical Exam  Constitutional: He is oriented to person, place, and time. He appears well-developed and well-nourished.  HENT:  Head: Normocephalic and atraumatic.  Eyes: EOM are normal. Pupils are equal, round, and reactive to light.  Neck: Neck supple.  Cardiovascular: Normal rate, regular rhythm and intact distal pulses.   Pulmonary/Chest: Effort normal and breath sounds normal. No respiratory distress.  Musculoskeletal: Normal range of motion.  Severe right lower extremity edema throughout with chronic changes. Distal motor, sensorium to light touch and cap refill/pulses intact. No areas of erythema or increased warmth to touch. No palpable cords or localized calf tenderness.  Neurological: He is alert and oriented to person, place, and time.  Skin: Skin is warm and dry.    ED Course  Procedures (including critical care time)  Percocet PO  Previous records reviewed - patient has been evaluated for the symptoms in the past. No cellulitis. No trauma or indication for x-rays or CT scan at this time. Outpatient ultrasound ordered. Patient stable for discharge home and outpatient followup with referral to [facility].  Return precautions provided and verbalizes understood  MDM   1. Edema of right lower extremity    Medications provided Vital signs and nursing notes reviewed and considered    Sunnie Nielsen, MD 05/12/13 [phone]

## 2013-05-12 NOTE — ED Notes (Signed)
Pt c/o pain and edema in right leg, Pt leg considerable larger than other

## 2013-05-12 NOTE — ED Notes (Addendum)
Presents with right lower leg swelling pt reports it has been going on for a while but not as swollen as it is today. CMS intact, right leg edematous. Reports pain goes from right leg up sharp and shooting.

## 2013-06-02 ENCOUNTER — Encounter (HOSPITAL_COMMUNITY): Payer: Self-pay | Admitting: Emergency Medicine

## 2013-06-02 ENCOUNTER — Emergency Department (HOSPITAL_COMMUNITY)
Admission: EM | Admit: 2013-06-02 | Discharge: 2013-06-02 | Disposition: A | Discharge status: Home / Self Care | Payer: Self-pay | Attending: Emergency Medicine | Admitting: Emergency Medicine

## 2013-06-02 DIAGNOSIS — Z5900 Homelessness unspecified: Secondary | ICD-10-CM | POA: Insufficient documentation

## 2013-06-02 DIAGNOSIS — Y929 Unspecified place or not applicable: Secondary | ICD-10-CM | POA: Insufficient documentation

## 2013-06-02 DIAGNOSIS — S81801A Unspecified open wound, right lower leg, initial encounter: Secondary | ICD-10-CM

## 2013-06-02 DIAGNOSIS — Y939 Activity, unspecified: Secondary | ICD-10-CM | POA: Insufficient documentation

## 2013-06-02 DIAGNOSIS — Z862 Personal history of diseases of the blood and blood-forming organs and certain disorders involving the immune mechanism: Secondary | ICD-10-CM | POA: Insufficient documentation

## 2013-06-02 DIAGNOSIS — X58XXXA Exposure to other specified factors, initial encounter: Secondary | ICD-10-CM | POA: Insufficient documentation

## 2013-06-02 DIAGNOSIS — Z872 Personal history of diseases of the skin and subcutaneous tissue: Secondary | ICD-10-CM | POA: Insufficient documentation

## 2013-06-02 DIAGNOSIS — Z8659 Personal history of other mental and behavioral disorders: Secondary | ICD-10-CM | POA: Insufficient documentation

## 2013-06-02 DIAGNOSIS — S81009A Unspecified open wound, unspecified knee, initial encounter: Secondary | ICD-10-CM | POA: Insufficient documentation

## 2013-06-02 DIAGNOSIS — M7989 Other specified soft tissue disorders: Secondary | ICD-10-CM | POA: Insufficient documentation

## 2013-06-02 LAB — CBC WITH DIFFERENTIAL/PLATELET
Basophils Relative: 1 % (ref 0–1)
Eosinophils Absolute: 0.1 10*3/uL (ref 0.0–0.7)
Eosinophils Relative: 4 % (ref 0–5)
Lymphs Abs: 1.5 10*3/uL (ref 0.7–4.0)
MCH: 33.5 pg (ref 26.0–34.0)
MCHC: 35 g/dL (ref 30.0–36.0)
MCV: 95.7 fL (ref 78.0–100.0)
Platelets: 228 10*3/uL (ref 150–400)
RBC: 3.76 MIL/uL — ABNORMAL LOW (ref 4.22–5.81)
RDW: 13.3 % (ref 11.5–15.5)

## 2013-06-02 LAB — BASIC METABOLIC PANEL
Calcium: 8.7 mg/dL (ref 8.4–10.5)
GFR calc non Af Amer: 64 mL/min — ABNORMAL LOW (ref >90)
Glucose, Bld: 85 mg/dL (ref 70–99)
Sodium: 138 mEq/L (ref 135–145)

## 2013-06-02 MED ORDER — NAPROXEN 250 MG PO TABS
500.0000 mg | ORAL_TABLET | Freq: Two times a day (BID) | ORAL | Status: DC
  Administered 2013-06-02: 500 mg via ORAL
  Filled 2013-06-02: qty 2

## 2013-06-02 MED ORDER — BACITRACIN-NEOMYCIN-POLYMYXIN 400-5-5000 EX OINT: 1.0000 "application " | TOPICAL_OINTMENT | Freq: Two times a day (BID) | CUTANEOUS | Status: DC

## 2013-06-02 NOTE — ED Notes (Addendum)
Wound care carried out per order with wound cleanser, bacitracin and a 2x2 dressing. Pt given socks.

## 2013-06-02 NOTE — ED Provider Notes (Signed)
CSN: [id number]     Arrival date & time 06/02/13  0104 History   First MD Initiated Contact with Patient 06/02/13 0130     Chief Complaint  Patient presents with  . Leg Pain   (Consider location/radiation/quality/duration/timing/severity/associated sxs/prior Treatment) HPI 42 year old male presents to emergency room with complaint of open wound and pain to his right lower leg.  Patient has history of chronic swelling of his right lower leg.  He reports over the last 2 days.  He has had drainage of clear fluid and pain to the site.  Unclear etiology of lymphedema to right leg.  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  He denies any fever or chills.  Past Medical History  Diagnosis Date  . Anemia   . Paranoid schizophrenia   . Cellulitis   . Homelessness    History reviewed. No pertinent past surgical history. Family History  Problem Relation Age of Onset  . CAD      Neg HX  . Hypertension      Neg Hx  . Diabetes      Neg Hx  . Cancer      Neg Hx   History  Substance Use Topics  . Smoking status: Never Smoker   . Smokeless tobacco: Not on file  . Alcohol Use: No    Review of Systems  All other systems reviewed and are negative.    Allergies  Review of patient's allergies indicates no known allergies.  Home Medications  No current outpatient prescriptions on file. BP 111/68  Pulse 65  Temp(Src) 97.8 F (36.6 C) (Oral)  Resp 18  SpO2 98% Physical Exam  Nursing note and vitals reviewed. Constitutional: He appears well-developed and well-nourished. No distress.  HENT:  Head: Normocephalic and atraumatic.  Cardiovascular: Normal rate, regular rhythm, normal heart sounds and intact distal pulses.  Exam reveals no gallop and no friction rub.   No murmur heard. Pulmonary/Chest: Effort normal and breath sounds normal. No respiratory distress. He has no wheezes. He has no  rales. He exhibits no tenderness.  Abdominal: Soft. Bowel sounds are normal. He exhibits no distension and no mass. There is no tenderness. There is no rebound and no guarding.  Musculoskeletal: He exhibits edema.  Issue with chronic edema to right lower extremity.  He has a 2 cm wound to his right lower lateral leg.  There is no erythema, no induration, no drainage.  It appears that it was a bullous vesicle-type lesion that has ruptured.  Skin: Skin is warm and dry. No rash noted. No erythema. No pallor.    ED Course  Procedures (including critical care time) Labs Review Labs Reviewed  CBC WITH DIFFERENTIAL - Abnormal; Notable for the following:    WBC 3.4 (*)    RBC 3.76 (*)    Hemoglobin 12.6 (*)    HCT 36.0 (*)    Neutrophils Relative % 36 (*)    Neutro Abs 1.2 (*)    Monocytes Relative 15 (*)    All other components within normal limits  BASIC METABOLIC PANEL   Imaging Review No results found.  EKG Interpretation   None       MDM   1. Wound of lower extremity, right, initial encounter    42 year old male with wound to right lower leg, probable early venous stasis ulcer.  No signs of infection.  Wound dressed.  Labs sent.  Plan for followup with [facility] as he indicates that his primary care Dr.  Maryclare Labrador also give him followup either here in the emergency department or with the wellness [facility].    Olivia Mackie, MD 06/02/13 [phone]

## 2013-06-02 NOTE — ED Notes (Signed)
Pt states that he has a wound on his right lower leg that has been draining clear fluid. Pt states that he tried to keep the area clean but then his right leg started swelling and he was concerned and came in. Pt does have obvious swelling to right leg compared to left (up to the thigh.) pt able to wiggle toes and cap refill less than 3.

## 2013-06-02 NOTE — ED Notes (Signed)
Called pt's name in waiting area x 2 with no answer.  Pt came from vending machines eating Cheetos and asked for a blanket.  Difficulty having pt answer triage questions.  C/o pain, edema, and fluid drainage from R leg x 2 days.  Difficulty having pt pull up pants leg in triage to assess.  Pt does have small skin tear at bottom of R leg. R leg appears to be bigger than left leg- pt was seen 1 month ago for R leg edema.

## 2013-06-09 DIAGNOSIS — Z862 Personal history of diseases of the blood and blood-forming organs and certain disorders involving the immune mechanism: Secondary | ICD-10-CM | POA: Insufficient documentation

## 2013-06-09 DIAGNOSIS — Z872 Personal history of diseases of the skin and subcutaneous tissue: Secondary | ICD-10-CM | POA: Insufficient documentation

## 2013-06-09 DIAGNOSIS — R609 Edema, unspecified: Secondary | ICD-10-CM | POA: Insufficient documentation

## 2013-06-09 DIAGNOSIS — Z5900 Homelessness unspecified: Secondary | ICD-10-CM | POA: Insufficient documentation

## 2013-06-09 DIAGNOSIS — Z8659 Personal history of other mental and behavioral disorders: Secondary | ICD-10-CM | POA: Insufficient documentation

## 2013-06-10 ENCOUNTER — Encounter (HOSPITAL_COMMUNITY): Payer: Self-pay | Admitting: Emergency Medicine

## 2013-06-10 ENCOUNTER — Emergency Department (HOSPITAL_COMMUNITY)
Admission: EM | Admit: 2013-06-10 | Discharge: 2013-06-10 | Disposition: A | Discharge status: Home / Self Care | Payer: Self-pay | Attending: Emergency Medicine | Admitting: Emergency Medicine

## 2013-06-10 DIAGNOSIS — R600 Localized edema: Secondary | ICD-10-CM

## 2013-06-10 DIAGNOSIS — I890 Lymphedema, not elsewhere classified: Secondary | ICD-10-CM

## 2013-06-10 MED ORDER — CEPHALEXIN 500 MG PO CAPS: 500.0000 mg | ORAL_CAPSULE | Freq: Four times a day (QID) | ORAL | Status: DC

## 2013-06-10 MED ORDER — TRAMADOL HCL 50 MG PO TABS
50.0000 mg | ORAL_TABLET | Freq: Once | ORAL | Status: AC
  Administered 2013-06-10: 50 mg via ORAL
  Filled 2013-06-10: qty 1

## 2013-06-10 MED ORDER — MELOXICAM 15 MG PO TABS: 15.0000 mg | ORAL_TABLET | Freq: Every day | ORAL | Status: DC

## 2013-06-10 NOTE — ED Provider Notes (Signed)
Medical screening examination/treatment/procedure(s) were performed by non-physician practitioner and as supervising physician I was immediately available for consultation/collaboration.    Jamesa Tedrick D Gedalia Mcmillon, MD 06/10/13 0728 

## 2013-06-10 NOTE — ED Provider Notes (Signed)
CSN: [id number]     Arrival date & time 06/09/13  2347 History   First MD Initiated Contact with Patient 06/10/13 0008     Chief Complaint  Patient presents with  . Leg Pain   HPI  History provided by the patient. Patient is a 42 year old male who is homeless with past history of paranoid schizophrenia and chronic right lower extremity swelling and lymphedema. Patient presents today with complaints of worsening pain swelling to his right lower leg. He was seen earlier this month 8 days ago for similar complaints. He states he has continued to have swelling and pain especially with walking on the streets. He also reports occasional weeping and drainage from a small wounds of the skin. He has not used any treatment for his symptoms. He has not followed up with a primary care provider. He denies any weakness or numbness in the foot. Denies any chest pain or shortness of breath. No fever, chills or sweats. No other aggravating or alleviating factors. No other associated symptoms.    Past Medical History  Diagnosis Date  . Anemia   . Paranoid schizophrenia   . Cellulitis   . Homelessness    History reviewed. No pertinent past surgical history. Family History  Problem Relation Age of Onset  . CAD      Neg HX  . Hypertension      Neg Hx  . Diabetes      Neg Hx  . Cancer      Neg Hx   History  Substance Use Topics  . Smoking status: Never Smoker   . Smokeless tobacco: Not on file  . Alcohol Use: No    Review of Systems  Constitutional: Negative for fever, chills and diaphoresis.  Respiratory: Negative for shortness of breath.   Cardiovascular: Negative for chest pain.  Neurological: Negative for weakness and numbness.  All other systems reviewed and are negative.    Allergies  Review of patient's allergies indicates no known allergies.  Home Medications   Current Outpatient Rx  Name  Route  Sig  Dispense  Refill  . neomycin-bacitracin-polymyxin (NEOSPORIN) ointment  Topical   Apply 1 application topically every 12 (twelve) hours.   15 g   0    BP 132/87  Pulse 68  Temp(Src) 98.8 F (37.1 C) (Oral)  Resp 14  SpO2 100% Physical Exam  Nursing note and vitals reviewed. Constitutional: He is oriented to person, place, and time. He appears well-developed and well-nourished. No distress.  HENT:  Head: Normocephalic and atraumatic.  Cardiovascular: Normal rate, regular rhythm, normal heart sounds and intact distal pulses.  Exam reveals no gallop and no friction rub.   No murmur heard. Pulmonary/Chest: Effort normal and breath sounds normal. No respiratory distress.  Abdominal: Soft. Bowel sounds are normal. There is no tenderness.  Musculoskeletal: He exhibits edema.  Chronic appearing edema to right lower extremity.  There is a 2 cm wound to his right anterior lower leg without bleeding or drainage.  There is no erythema, no induration, no drainage.  No increased warmth.   Neurological: He is alert and oriented to person, place, and time.  Skin: Skin is warm and dry. No rash noted. No erythema. No pallor.  Psychiatric: He has a normal mood and affect.    ED Course  Procedures   DIAGNOSTIC STUDIES: Oxygen Saturation is 100% on room air.    COORDINATION OF CARE:  Nursing notes reviewed. Vital signs reviewed. Initial pt interview and examination performed.  12:29 AM-patient seen and evaluated. He appears well. He does not appear in significant discomfort or pain. Right lower extremity appears to have chronic swelling without any significant change from his most recent medical note. He continues to have a small 1-2 cm wound to the leg without any bleeding or drainage. Very low clinical suspicion for cellulitis. Patient does complain the pain is worsened. Given his continued open wound I will give prescription for Keflex. Discussed treatmen plan with pt at bedside, which includes RICE, and antibiotic. Pt agrees with plan.    Treatment plan  initiated: Medications  traMADol (ULTRAM) tablet 50 mg (not administered)     MDM   1. Lower extremity edema   2. Lymphedema of leg        Angus Seller, PA-C 06/10/13 0041

## 2013-06-10 NOTE — ED Notes (Signed)
Pt. reports right leg pain for several months , denies injury , ambulatory.

## 2013-06-15 ENCOUNTER — Emergency Department (HOSPITAL_COMMUNITY)
Admission: EM | Admit: 2013-06-15 | Discharge: 2013-06-15 | Disposition: A | Discharge status: Home / Self Care | Payer: Self-pay | Attending: Emergency Medicine | Admitting: Emergency Medicine

## 2013-06-15 ENCOUNTER — Encounter (HOSPITAL_COMMUNITY): Payer: Self-pay | Admitting: Emergency Medicine

## 2013-06-15 DIAGNOSIS — Z862 Personal history of diseases of the blood and blood-forming organs and certain disorders involving the immune mechanism: Secondary | ICD-10-CM | POA: Insufficient documentation

## 2013-06-15 DIAGNOSIS — Z5900 Homelessness unspecified: Secondary | ICD-10-CM | POA: Insufficient documentation

## 2013-06-15 DIAGNOSIS — R609 Edema, unspecified: Secondary | ICD-10-CM | POA: Insufficient documentation

## 2013-06-15 DIAGNOSIS — L02419 Cutaneous abscess of limb, unspecified: Secondary | ICD-10-CM | POA: Insufficient documentation

## 2013-06-15 DIAGNOSIS — L0390 Cellulitis, unspecified: Secondary | ICD-10-CM

## 2013-06-15 DIAGNOSIS — Z8659 Personal history of other mental and behavioral disorders: Secondary | ICD-10-CM | POA: Insufficient documentation

## 2013-06-15 DIAGNOSIS — R600 Localized edema: Secondary | ICD-10-CM

## 2013-06-15 MED ORDER — CLINDAMYCIN HCL 150 MG PO CAPS: 150.0000 mg | ORAL_CAPSULE | Freq: Four times a day (QID) | ORAL | Status: DC

## 2013-06-15 MED ORDER — TRAMADOL HCL 50 MG PO TABS
50.0000 mg | ORAL_TABLET | Freq: Once | ORAL | Status: AC
  Administered 2013-06-15: 50 mg via ORAL
  Filled 2013-06-15: qty 1

## 2013-06-15 MED ORDER — TRAMADOL HCL 50 MG PO TABS: 50.0000 mg | ORAL_TABLET | Freq: Four times a day (QID) | ORAL | Status: DC | PRN

## 2013-06-15 MED ORDER — CLINDAMYCIN HCL 300 MG PO CAPS
300.0000 mg | ORAL_CAPSULE | Freq: Once | ORAL | Status: AC
  Administered 2013-06-15: 300 mg via ORAL
  Filled 2013-06-15: qty 1

## 2013-06-15 MED ORDER — ACETAMINOPHEN 325 MG PO TABS
650.0000 mg | ORAL_TABLET | Freq: Once | ORAL | Status: AC
  Administered 2013-06-15: 650 mg via ORAL
  Filled 2013-06-15: qty 2

## 2013-06-15 NOTE — ED Provider Notes (Signed)
CSN: [id number]     Arrival date & time 06/15/13  0008 History   First MD Initiated Contact with Patient 06/15/13 0209     Chief Complaint  Patient presents with  . Leg Swelling   (Consider location/radiation/quality/duration/timing/severity/associated sxs/prior Treatment) HPI History provided by the patient. Patient is a 42 year old male who is homeless with past history of paranoid schizophrenia and chronic right lower extremity swelling and lymphedema.  Presents tonight with ongoing right leg pain and swelling, feels worse than normal. No fevers. But some chills. No nausea vomiting. No new trauma. He does have 2 open areas that he states spontaneously appeared without trauma. No weakness or numbness. No chest pain or shortness of breath. Past Medical History  Diagnosis Date  . Anemia   . Paranoid schizophrenia   . Cellulitis   . Homelessness    History reviewed. No pertinent past surgical history. Family History  Problem Relation Age of Onset  . CAD      Neg HX  . Hypertension      Neg Hx  . Diabetes      Neg Hx  . Cancer      Neg Hx   History  Substance Use Topics  . Smoking status: Never Smoker   . Smokeless tobacco: Not on file  . Alcohol Use: No    Review of Systems  Constitutional: Positive for chills. Negative for fever.  Respiratory: Negative for shortness of breath.   Cardiovascular: Negative for chest pain.  Gastrointestinal: Negative for vomiting and abdominal pain.  Musculoskeletal: Negative for back pain, neck pain and neck stiffness.  Skin: Positive for wound. Negative for rash.  Neurological: Negative for headaches.  All other systems reviewed and are negative.    Allergies  Review of patient's allergies indicates no known allergies.  Home Medications  No current outpatient prescriptions on file. BP 128/79  Pulse 81  Temp(Src) 97.3 F (36.3 C) (Oral)  Resp 18  Wt 201 lb 1.6 oz (91.218 kg)  SpO2 100% Physical Exam  Constitutional: He is  oriented to person, place, and time. He appears well-developed and well-nourished.  HENT:  Head: Normocephalic and atraumatic.  Eyes: EOM are normal. Pupils are equal, round, and reactive to light.  Neck: Neck supple.  Cardiovascular: Normal rate, regular rhythm and intact distal pulses.   Pulmonary/Chest: Effort normal and breath sounds normal. No respiratory distress.  Musculoskeletal: Normal range of motion.  Right lower extremity with chronic changes, severe swelling and 2 small superficial open areas with mild surrounding erythema but no localized swelling or fluctuance. No streaking erythema. Distal neurovascular intact. No increased warmth to touch.  Neurological: He is alert and oriented to person, place, and time.  Skin: Skin is warm and dry.    ED Course  Procedures (including critical care time) Labs Review  Ultram clindamycin provided.  Plan  -    Discharge home with referral to [facility]. Prescription for 6 Ultram and 7 day course of clindamycin.   MDM  Diagnosis: Mild cellulitis right lower extremity, chronic lymphedema right lower extremity  No fever or indication for labs or admission at this time Medications provided Vital signs and nursing notes reviewed and considered    Sunnie Nielsen, MD 06/15/13 [phone]

## 2013-06-15 NOTE — ED Notes (Addendum)
Pt states he has swelling noted to his right lower leg.  Leg swollen and noted to be warm to touch.  Small wound noted to lower shin weeping fluid.  Per pt this is the only wound.

## 2013-07-01 ENCOUNTER — Encounter (HOSPITAL_COMMUNITY): Payer: Self-pay | Admitting: Emergency Medicine

## 2013-07-01 DIAGNOSIS — B9789 Other viral agents as the cause of diseases classified elsewhere: Secondary | ICD-10-CM | POA: Insufficient documentation

## 2013-07-01 DIAGNOSIS — Z5900 Homelessness unspecified: Secondary | ICD-10-CM | POA: Insufficient documentation

## 2013-07-01 DIAGNOSIS — Z792 Long term (current) use of antibiotics: Secondary | ICD-10-CM | POA: Insufficient documentation

## 2013-07-01 DIAGNOSIS — Z872 Personal history of diseases of the skin and subcutaneous tissue: Secondary | ICD-10-CM | POA: Insufficient documentation

## 2013-07-01 DIAGNOSIS — Z862 Personal history of diseases of the blood and blood-forming organs and certain disorders involving the immune mechanism: Secondary | ICD-10-CM | POA: Insufficient documentation

## 2013-07-01 DIAGNOSIS — R109 Unspecified abdominal pain: Secondary | ICD-10-CM | POA: Insufficient documentation

## 2013-07-01 DIAGNOSIS — Z8659 Personal history of other mental and behavioral disorders: Secondary | ICD-10-CM | POA: Insufficient documentation

## 2013-07-01 NOTE — ED Notes (Signed)
Pt. reports productive cough for 3 days with slight nausea and chills.

## 2013-07-01 NOTE — ED Notes (Addendum)
Called pt in triage. No answer.

## 2013-07-02 ENCOUNTER — Emergency Department (HOSPITAL_COMMUNITY)
Admission: EM | Admit: 2013-07-02 | Discharge: 2013-07-02 | Disposition: A | Discharge status: Home / Self Care | Payer: Self-pay | Attending: Emergency Medicine | Admitting: Emergency Medicine

## 2013-07-02 ENCOUNTER — Emergency Department (HOSPITAL_COMMUNITY): Payer: Self-pay

## 2013-07-02 DIAGNOSIS — B349 Viral infection, unspecified: Secondary | ICD-10-CM

## 2013-07-02 MED ORDER — PROMETHAZINE HCL 25 MG PO TABS: 25.0000 mg | ORAL_TABLET | Freq: Four times a day (QID) | ORAL | Status: DC | PRN

## 2013-07-02 MED ORDER — ONDANSETRON 4 MG PO TBDP
8.0000 mg | ORAL_TABLET | Freq: Once | ORAL | Status: AC
  Administered 2013-07-02: 8 mg via ORAL
  Filled 2013-07-02: qty 2

## 2013-07-02 NOTE — ED Notes (Signed)
Patient called x 3 no answer 

## 2013-07-02 NOTE — ED Provider Notes (Signed)
Medical screening examination/treatment/procedure(s) were performed by non-physician practitioner and as supervising physician I was immediately available for consultation/collaboration.  EKG Interpretation   None         Ancil Dewan H Bayard More, MD 07/02/13 0716 

## 2013-07-02 NOTE — ED Provider Notes (Signed)
CSN: [id number]     Arrival date & time 07/01/13  2312 History   First MD Initiated Contact with Patient 07/02/13 0059     Chief Complaint  Patient presents with  . Cough   (Consider location/radiation/quality/duration/timing/severity/associated sxs/prior Treatment) HPI History provided by pt.  Pt is poorly cooperative.  Pt presents w/ chills, productive cough and nausea x 3 days.  Denies associated nasal congestion, rhinorrhea, sore throat, abdominal pain and diarrhea.  Has not taken anything for sx.  No known sick contacts.  No PMH. Past Medical History  Diagnosis Date  . Anemia   . Paranoid schizophrenia   . Cellulitis   . Homelessness    History reviewed. No pertinent past surgical history. Family History  Problem Relation Age of Onset  . CAD      Neg HX  . Hypertension      Neg Hx  . Diabetes      Neg Hx  . Cancer      Neg Hx   History  Substance Use Topics  . Smoking status: Never Smoker   . Smokeless tobacco: Not on file  . Alcohol Use: No    Review of Systems  All other systems reviewed and are negative.    Allergies  Review of patient's allergies indicates no known allergies.  Home Medications   Current Outpatient Rx  Name  Route  Sig  Dispense  Refill  . clindamycin (CLEOCIN) 150 MG capsule   Oral   Take 1 capsule (150 mg total) by mouth every 6 (six) hours.   28 capsule   0   . traMADol (ULTRAM) 50 MG tablet   Oral   Take 1 tablet (50 mg total) by mouth every 6 (six) hours as needed.   6 tablet   0    BP 126/82  Pulse 81  Temp(Src) 98.1 F (36.7 C) (Oral)  Resp 16  Ht 6\' 1"  (1.854 m)  Wt 204 lb (92.534 kg)  BMI 26.92 kg/m2  SpO2 100% Physical Exam  Nursing note and vitals reviewed. Constitutional: He is oriented to person, place, and time. He appears well-developed and well-nourished. No distress.  HENT:  Head: Normocephalic and atraumatic.  Mouth/Throat: Oropharynx is clear and moist. No oropharyngeal exudate.  Eyes:  Normal  appearance  Neck: Normal range of motion.  Cardiovascular: Normal rate and regular rhythm.   Pulmonary/Chest: Effort normal and breath sounds normal. No respiratory distress.  Abdominal: Soft. Bowel sounds are normal. He exhibits no distension.  Pt reports tenderness but will not allow me to touch his abdomen and pushes my hand away.  He looks as though he may become aggressive.   Musculoskeletal: Normal range of motion.  Neurological: He is alert and oriented to person, place, and time.  Skin: Skin is warm and dry. No rash noted.  Psychiatric: He has a normal mood and affect. His behavior is normal.    ED Course  Procedures (including critical care time) Labs Review Labs Reviewed - No data to display Imaging Review Dg Chest 2 View  07/02/2013   CLINICAL DATA:  Cough and chest pain.  EXAM: CHEST  2 VIEW  COMPARISON:  None.  FINDINGS: The cardiac silhouette, mediastinal and hilar contours are within normal limits. The lungs are clear. No pleural effusion. The bony thorax is intact.  IMPRESSION: No acute cardiopulmonary findings.   Electronically Signed   By: Kalman Jewels M.D.   On: 07/02/2013 02:13    EKG Interpretation   None  MDM   1. Viral syndrome    43yo homeless M w/ paranoid schizophrenia presents w/ c/o cough, chills and nausea x 3 days.  He is poorly cooperative both with history and exam.  Afebrile, lungs clear, will not allow me to examine abdomen. CXR ordered to r/o pna d/t limited exam and pt to received ODT zofran.  1:18 AM   Pt had to be wanded by security because uncooperative in xray and there was some concern that he may be concealing a weapon.  No weapon found.  CXR neg.  Results discussed w/ patient.  Prescribed phenergan for nausea and recommended return for CP/SOB, severe abd pain or uncontrolled vomiting.  2:24 AM     Remer Macho, PA-C 07/02/13 [phone]

## 2013-07-02 NOTE — Discharge Instructions (Signed)
Treat pain and/or fever w/ motrin or tylenol.  You can alternate these two medications every three hours if necessary.   Take phenergan as needed for nausea.  Get rest and drink plenty of fluids.  Return to the ER if you develop chest pain, shortness of breath, severe abdominal pain or uncontrolled vomiting.  Call [facility] [phone]) if you do not have a primary care doctor and would like assistance with finding one.

## 2013-07-02 NOTE — ED Notes (Signed)
Pt located in lobby underneath a blanket sleeping.

## 2013-07-19 DIAGNOSIS — IMO0001 Reserved for inherently not codable concepts without codable children: Secondary | ICD-10-CM | POA: Insufficient documentation

## 2013-07-19 DIAGNOSIS — IMO0002 Reserved for concepts with insufficient information to code with codable children: Secondary | ICD-10-CM | POA: Insufficient documentation

## 2013-07-19 DIAGNOSIS — R609 Edema, unspecified: Secondary | ICD-10-CM | POA: Insufficient documentation

## 2013-07-19 DIAGNOSIS — Z862 Personal history of diseases of the blood and blood-forming organs and certain disorders involving the immune mechanism: Secondary | ICD-10-CM | POA: Insufficient documentation

## 2013-07-19 DIAGNOSIS — Z8659 Personal history of other mental and behavioral disorders: Secondary | ICD-10-CM | POA: Insufficient documentation

## 2013-07-19 DIAGNOSIS — Z872 Personal history of diseases of the skin and subcutaneous tissue: Secondary | ICD-10-CM | POA: Insufficient documentation

## 2013-07-19 DIAGNOSIS — Z5900 Homelessness unspecified: Secondary | ICD-10-CM | POA: Insufficient documentation

## 2013-07-20 ENCOUNTER — Emergency Department (HOSPITAL_COMMUNITY)
Admission: EM | Admit: 2013-07-20 | Discharge: 2013-07-20 | Disposition: A | Discharge status: Home / Self Care | Payer: Self-pay | Attending: Emergency Medicine | Admitting: Emergency Medicine

## 2013-07-20 ENCOUNTER — Encounter (HOSPITAL_COMMUNITY): Payer: Self-pay | Admitting: Emergency Medicine

## 2013-07-20 DIAGNOSIS — R600 Localized edema: Secondary | ICD-10-CM

## 2013-07-20 MED ORDER — TRAMADOL HCL 50 MG PO TABS: 50.0000 mg | ORAL_TABLET | Freq: Four times a day (QID) | ORAL | Status: DC | PRN

## 2013-07-20 MED ORDER — TRAMADOL HCL 50 MG PO TABS
50.0000 mg | ORAL_TABLET | Freq: Once | ORAL | Status: AC
  Administered 2013-07-20: 50 mg via ORAL
  Filled 2013-07-20: qty 1

## 2013-07-20 MED ORDER — CLINDAMYCIN HCL 150 MG PO CAPS: 300.0000 mg | ORAL_CAPSULE | Freq: Three times a day (TID) | ORAL | Status: DC

## 2013-07-20 NOTE — ED Provider Notes (Signed)
CSN: [id number]     Arrival date & time 07/19/13  2349 History   First MD Initiated Contact with Patient 07/20/13 0114     Chief Complaint  Patient presents with  . Leg Pain   (Consider location/radiation/quality/duration/timing/severity/associated sxs/prior Treatment) HPI Comments: Patient is a 43 year old male with a history of paranoid schizophrenia and chronic right lower extremity edema who presents for right leg pain times several weeks. Patient states that symptoms have been gradually worsening over the last few days. He states he has been trying elevation with mild relief. He states "I ain't take no pain pills". Patient denies any trauma or injury to the area. He denies and inability to ambulate. Patient further denies associated fever, chest pain or shortness of breath, cough, nausea or vomiting, redness to his right leg or red linear streaking, numbness/weakness, and tingling.  The history is provided by the patient. No language interpreter was used.    Past Medical History  Diagnosis Date  . Anemia   . Paranoid schizophrenia   . Cellulitis   . Homelessness    History reviewed. No pertinent past surgical history. Family History  Problem Relation Age of Onset  . CAD      Neg HX  . Hypertension      Neg Hx  . Diabetes      Neg Hx  . Cancer      Neg Hx   History  Substance Use Topics  . Smoking status: Never Smoker   . Smokeless tobacco: Not on file  . Alcohol Use: No    Review of Systems  Cardiovascular: Positive for leg swelling.  Musculoskeletal: Positive for myalgias.  All other systems reviewed and are negative.    Allergies  Review of patient's allergies indicates no known allergies.  Home Medications   Current Outpatient Rx  Name  Route  Sig  Dispense  Refill  . clindamycin (CLEOCIN) 150 MG capsule   Oral   Take 2 capsules (300 mg total) by mouth 3 (three) times daily. May dispense as 150mg  capsules   48 capsule   0   . traMADol (ULTRAM) 50 MG  tablet   Oral   Take 1 tablet (50 mg total) by mouth every 6 (six) hours as needed.   13 tablet   0    BP 134/73  Pulse 79  Temp(Src) 98.2 F (36.8 C) (Oral)  Resp 16  SpO2 98%  Physical Exam  Nursing note and vitals reviewed. Constitutional: He is oriented to person, place, and time. He appears well-developed and well-nourished. No distress.  Patient agitated; answers questions curtly. Refuses to put on gown for examination.  HENT:  Head: Normocephalic and atraumatic.  Mouth/Throat: Oropharynx is clear and moist. No oropharyngeal exudate.  Eyes: Conjunctivae and EOM are normal. No scleral icterus.  Neck: Normal range of motion.  Cardiovascular: Normal rate, regular rhythm and intact distal pulses.   DP and PT pulses 2+ bilaterally. Capillary refill normal in all toes of R foot.  Pulmonary/Chest: Effort normal. No respiratory distress.  Musculoskeletal: Normal range of motion. He exhibits edema.  Patient with diffuse edema to left lower extremity extending to mid/upper thigh. Palpation causes patient mild discomfort. No red linear streaking. No purulence, drainage, or weeping. No erythema, heat to touch, or area of fluctuance. Normal ROM of RLE.  Neurological: He is alert and oriented to person, place, and time.  No gross sensory deficits appreciated. Patient ambulatory in ED. Patellar and Achilles reflexes 2+ and left lower extremity.  Skin: Skin is warm and dry. No rash noted. He is not diaphoretic. No erythema. No pallor.  Psychiatric: His speech is normal. Thought content normal. He is agitated.    ED Course  Procedures (including critical care time) Labs Review Labs Reviewed - No data to display Imaging Review No results found.  EKG Interpretation   None       MDM   1. Edema of right lower extremity    43 year old male presents for worsening right lower extremity pain secondary to his peripheral edema. Patient has been seen numerous times in the emergency  department for similar complaints. He is well and nontoxic appearing, hemodynamically stable, and afebrile. He denies chest pain, shortness of breath, syncope or near syncope, and cough; he is PERC negative. Doubt DVT as cause of symptoms given it's chronicity. Patient neurovascularly intact today with normal sensation and strength in his right lower extremity. No evidence to suspect infectious process; no fever, tachycardia, red streaking, or heat to touch. However, given patient's homelessness and history of soft tissue skin infections to this area, will start on oral dose of clindamycin. Patient advised outpatient followup with the [facility]. Tramadol for pain control. Return precautions provided and patient agreeable to plan with no unaddressed concerns.   Filed Vitals:   07/20/13 0001 07/20/13 0335  BP: 122/80 134/73  Pulse: 76 79  Temp: 97.6 F (36.4 C) 98.2 F (36.8 C)  TempSrc: Oral Oral  Resp: 20 16  SpO2: 99% 98%       Antonietta Breach, PA-C 07/20/13 [phone]

## 2013-07-20 NOTE — ED Notes (Signed)
Pt. reports chronic right leg pain for several weeks , denies injury or fall , ambulatory.

## 2013-07-20 NOTE — Discharge Instructions (Signed)
Recommend rest, elevation, and compression with compression stockings. Recommend you take clindamycin as prescribed. Take tramadol as needed for pain control. Follow up with the [facility].  Peripheral Edema You have swelling in your legs (peripheral edema). This swelling is due to excess accumulation of salt and water in your body. Edema may be a sign of heart, kidney or liver disease, or a side effect of a medication. It may also be due to problems in the leg veins. Elevating your legs and using special support stockings may be very helpful, if the cause of the swelling is due to poor venous circulation. Avoid long periods of standing, whatever the cause. Treatment of edema depends on identifying the cause. Chips, pretzels, pickles and other salty foods should be avoided. Restricting salt in your diet is almost always needed. Water pills (diuretics) are often used to remove the excess salt and water from your body via urine. These medicines prevent the kidney from reabsorbing sodium. This increases urine flow. Diuretic treatment may also result in lowering of potassium levels in your body. Potassium supplements may be needed if you have to use diuretics daily. Daily weights can help you keep track of your progress in clearing your edema. You should call your caregiver for follow up care as recommended. SEEK IMMEDIATE MEDICAL CARE IF:   You have increased swelling, pain, redness, or heat in your legs.  You develop shortness of breath, especially when lying down.  You develop chest or abdominal pain, weakness, or fainting.  You have a fever. Document Released: 07/24/2004 Document Revised: 09/08/2011 Document Reviewed: 07/04/2009 [facility] Patient Information 2014 [place].

## 2013-07-22 NOTE — ED Provider Notes (Signed)
Medical screening examination/treatment/procedure(s) were performed by non-physician practitioner and as supervising physician I was immediately available for consultation/collaboration.  EKG Interpretation   None         Houston Siren, MD 07/22/13 [phone]

## 2013-08-11 DIAGNOSIS — M549 Dorsalgia, unspecified: Secondary | ICD-10-CM | POA: Insufficient documentation

## 2013-08-23 ENCOUNTER — Encounter (HOSPITAL_COMMUNITY): Payer: Self-pay | Admitting: Emergency Medicine

## 2013-08-23 DIAGNOSIS — R197 Diarrhea, unspecified: Secondary | ICD-10-CM | POA: Insufficient documentation

## 2013-08-23 LAB — CBC WITH DIFFERENTIAL/PLATELET
BASOS PCT: 0 % (ref 0–1)
Basophils Absolute: 0 10*3/uL (ref 0.0–0.1)
EOS ABS: 0.2 10*3/uL (ref 0.0–0.7)
Eosinophils Relative: 3 % (ref 0–5)
HCT: 34.9 % — ABNORMAL LOW (ref 39.0–52.0)
Hemoglobin: 12.3 g/dL — ABNORMAL LOW (ref 13.0–17.0)
Lymphocytes Relative: 39 % (ref 12–46)
Lymphs Abs: 2.2 10*3/uL (ref 0.7–4.0)
MCH: 32.8 pg (ref 26.0–34.0)
MCHC: 35.2 g/dL (ref 30.0–36.0)
MCV: 93.1 fL (ref 78.0–100.0)
Monocytes Absolute: 0.5 10*3/uL (ref 0.1–1.0)
Monocytes Relative: 9 % (ref 3–12)
NEUTROS PCT: 48 % (ref 43–77)
Neutro Abs: 2.8 10*3/uL (ref 1.7–7.7)
Platelets: 255 10*3/uL (ref 150–400)
RBC: 3.75 MIL/uL — ABNORMAL LOW (ref 4.22–5.81)
RDW: 12.7 % (ref 11.5–15.5)
WBC: 5.7 10*3/uL (ref 4.0–10.5)

## 2013-08-23 LAB — COMPREHENSIVE METABOLIC PANEL
ALBUMIN: 3.1 g/dL — AB (ref 3.5–5.2)
ALK PHOS: 65 U/L (ref 39–117)
ALT: 15 U/L (ref 0–53)
AST: 22 U/L (ref 0–37)
BUN: 19 mg/dL (ref 6–23)
CO2: 27 mEq/L (ref 19–32)
Calcium: 8.7 mg/dL (ref 8.4–10.5)
Chloride: 105 mEq/L (ref 96–112)
Creatinine, Ser: 1.18 mg/dL (ref 0.50–1.35)
GFR calc Af Amer: 86 mL/min — ABNORMAL LOW (ref >90)
GFR calc non Af Amer: 74 mL/min — ABNORMAL LOW (ref >90)
Glucose, Bld: 130 mg/dL — ABNORMAL HIGH (ref 70–99)
POTASSIUM: 4.2 meq/L (ref 3.7–5.3)
Sodium: 145 mEq/L (ref 137–147)
TOTAL PROTEIN: 6.7 g/dL (ref 6.0–8.3)
Total Bilirubin: <0.2 mg/dL — ABNORMAL LOW (ref 0.3–1.2)

## 2013-08-23 LAB — LIPASE, BLOOD: Lipase: 20 U/L (ref 11–59)

## 2013-08-23 NOTE — ED Notes (Signed)
Unable to locate pt. at triage and waiting area .  

## 2013-08-23 NOTE — ED Notes (Signed)
Pt. reports diarrhea onset today with mid abdominal pain , denies nausea or vomitting . No fever or chills.

## 2013-08-24 ENCOUNTER — Emergency Department (HOSPITAL_COMMUNITY)
Admission: EM | Admit: 2013-08-24 | Discharge: 2013-08-24 | Disposition: A | Discharge status: Home / Self Care | Payer: Self-pay | Attending: Emergency Medicine | Admitting: Emergency Medicine

## 2013-10-04 ENCOUNTER — Encounter (HOSPITAL_COMMUNITY): Payer: Self-pay | Admitting: Emergency Medicine

## 2013-10-04 DIAGNOSIS — Z5900 Homelessness unspecified: Secondary | ICD-10-CM | POA: Insufficient documentation

## 2013-10-04 DIAGNOSIS — Z87898 Personal history of other specified conditions: Secondary | ICD-10-CM | POA: Insufficient documentation

## 2013-10-04 DIAGNOSIS — M7989 Other specified soft tissue disorders: Secondary | ICD-10-CM | POA: Insufficient documentation

## 2013-10-04 NOTE — ED Notes (Signed)
Patient unwilling to take his pants down for this nurse to observe leg wound.  States he does not have any underwear on.

## 2013-10-04 NOTE — ED Notes (Signed)
Patient presents with c/o right leg swelling (history of lymphodema) and drainage from the leg.  States he thinks he walked into some metal or something and that is why his leg is draining.

## 2013-10-05 ENCOUNTER — Emergency Department (HOSPITAL_COMMUNITY)
Admission: EM | Admit: 2013-10-05 | Discharge: 2013-10-05 | Discharge status: Against Medical Advice | Payer: Self-pay | Attending: Emergency Medicine | Admitting: Emergency Medicine

## 2013-10-05 NOTE — ED Notes (Signed)
No answer when called 3 times.  

## 2013-10-10 NOTE — ED Provider Notes (Signed)
This patient left without being seen.   Elyn Peers, MD 10/10/13 [phone]

## 2014-02-19 ENCOUNTER — Emergency Department (HOSPITAL_BASED_OUTPATIENT_CLINIC_OR_DEPARTMENT_OTHER)
Admission: EM | Admit: 2014-02-19 | Discharge: 2014-02-19 | Disposition: A | Discharge status: Home / Self Care | Payer: Self-pay | Attending: Emergency Medicine | Admitting: Emergency Medicine

## 2014-02-19 ENCOUNTER — Encounter (HOSPITAL_BASED_OUTPATIENT_CLINIC_OR_DEPARTMENT_OTHER): Payer: Self-pay | Admitting: Emergency Medicine

## 2014-02-19 DIAGNOSIS — Z792 Long term (current) use of antibiotics: Secondary | ICD-10-CM | POA: Insufficient documentation

## 2014-02-19 DIAGNOSIS — M79609 Pain in unspecified limb: Secondary | ICD-10-CM | POA: Insufficient documentation

## 2014-02-19 DIAGNOSIS — Z5900 Homelessness unspecified: Secondary | ICD-10-CM | POA: Insufficient documentation

## 2014-02-19 DIAGNOSIS — L03119 Cellulitis of unspecified part of limb: Principal | ICD-10-CM

## 2014-02-19 DIAGNOSIS — Z862 Personal history of diseases of the blood and blood-forming organs and certain disorders involving the immune mechanism: Secondary | ICD-10-CM | POA: Insufficient documentation

## 2014-02-19 DIAGNOSIS — L02419 Cutaneous abscess of limb, unspecified: Secondary | ICD-10-CM | POA: Insufficient documentation

## 2014-02-19 DIAGNOSIS — Z8659 Personal history of other mental and behavioral disorders: Secondary | ICD-10-CM | POA: Insufficient documentation

## 2014-02-19 DIAGNOSIS — L03115 Cellulitis of right lower limb: Secondary | ICD-10-CM

## 2014-02-19 MED ORDER — CLINDAMYCIN HCL 150 MG PO CAPS
300.0000 mg | ORAL_CAPSULE | Freq: Once | ORAL | Status: AC
  Administered 2014-02-19: 300 mg via ORAL
  Filled 2014-02-19: qty 2

## 2014-02-19 MED ORDER — TRAMADOL HCL 50 MG PO TABS
50.0000 mg | ORAL_TABLET | Freq: Once | ORAL | Status: AC
  Administered 2014-02-19: 50 mg via ORAL
  Filled 2014-02-19: qty 1

## 2014-02-19 MED ORDER — CLINDAMYCIN HCL 300 MG PO CAPS: 300.0000 mg | ORAL_CAPSULE | Freq: Four times a day (QID) | ORAL | Status: DC

## 2014-02-19 MED ORDER — TRAMADOL HCL 50 MG PO TABS: 50.0000 mg | ORAL_TABLET | Freq: Four times a day (QID) | ORAL | Status: DC | PRN

## 2014-02-19 NOTE — ED Notes (Signed)
Pt reports pain/edema to RLE and back x3 days. Denies known injury. Pt reports that he was dropped off at ED.

## 2014-02-19 NOTE — ED Provider Notes (Signed)
CSN: [id number]     Arrival date & time 02/19/14  0240 History   First MD Initiated Contact with Patient 02/19/14 [phone]     Chief Complaint  Patient presents with  . Leg Pain     (Consider location/radiation/quality/duration/timing/severity/associated sxs/prior Treatment) Patient is a 43 y.o. male presenting with leg pain. The history is provided by the patient.  Leg Pain Location:  Leg Injury: no   Leg location:  R lower leg Pain details:    Quality:  Aching   Radiates to:  Does not radiate   Severity:  Severe   Onset quality:  Gradual   Timing:  Constant   Progression:  Unchanged Chronicity:  Recurrent Dislocation: no   Foreign body present:  No foreign bodies Prior injury to area:  No Relieved by:  Nothing Worsened by:  Nothing tried Ineffective treatments:  None tried Associated symptoms: no back pain, no fever, no stiffness and no tingling   Risk factors: no concern for non-accidental trauma   Chronic lymphedema of the RLE now with pain and redness is recurrent.  Sleeping in room upon entrance  Past Medical History  Diagnosis Date  . Anemia   . Paranoid schizophrenia   . Cellulitis   . Homelessness    History reviewed. No pertinent past surgical history. Family History  Problem Relation Age of Onset  . CAD      Neg HX  . Hypertension      Neg Hx  . Diabetes      Neg Hx  . Cancer      Neg Hx   History  Substance Use Topics  . Smoking status: Never Smoker   . Smokeless tobacco: Not on file  . Alcohol Use: No    Review of Systems  Constitutional: Negative for fever.  Musculoskeletal: Negative for back pain and stiffness.  Skin: Positive for color change. Negative for wound.  All other systems reviewed and are negative.     Allergies  Review of patient's allergies indicates no known allergies.  Home Medications   Prior to Admission medications   Medication Sig Start Date End Date Taking? Authorizing Provider  clindamycin (CLEOCIN) 300 MG  capsule Take 1 capsule (300 mg total) by mouth 4 (four) times daily. X 7 days 02/19/14   Chabely Norby K Krissie Merrick-Rasch, MD  traMADol (ULTRAM) 50 MG tablet Take 1 tablet (50 mg total) by mouth every 6 (six) hours as needed. 02/19/14   Mazzie Brodrick K Berdene Askari-Rasch, MD   BP 144/117  Pulse 65  Temp(Src) 97.9 F (36.6 C) (Oral)  Resp 21  Ht 6\' 1"  (1.854 m)  Wt 180 lb (81.647 kg)  BMI 23.75 kg/m2  SpO2 100% Physical Exam  Constitutional: He is oriented to person, place, and time. He appears well-developed and well-nourished. No distress.  HENT:  Head: Normocephalic and atraumatic.  Mouth/Throat: Oropharynx is clear and moist.  Eyes: Conjunctivae are normal. Pupils are equal, round, and reactive to light.  Neck: Normal range of motion. Neck supple.  Cardiovascular: Normal rate, regular rhythm and intact distal pulses.   Pulmonary/Chest: Effort normal and breath sounds normal. He has no wheezes. He has no rales.  Abdominal: Soft. Bowel sounds are normal. There is no tenderness. There is no rebound and no guarding.  Musculoskeletal: He exhibits no tenderness.  Chronic Lymphedema of the of the RLE  Neurological: He is alert and oriented to person, place, and time.  Skin: Skin is dry. There is erythema.  Mild at the ankle no streaking  no wounds  Psychiatric: He has a normal mood and affect.    ED Course  Procedures (including critical care time) Labs Review Labs Reviewed - No data to display  Imaging Review No results found.   EKG Interpretation None      MDM   Final diagnoses:  Cellulitis of right lower extremity   Very mild cellulitis of right ankle will treat with clindamycin as has worked for patient in the past.      Maxie Slovacek Alfonso Patten, MD 02/19/14 [phone]

## 2014-08-21 ENCOUNTER — Encounter (HOSPITAL_COMMUNITY): Payer: Self-pay | Admitting: *Deleted

## 2014-08-21 DIAGNOSIS — Z872 Personal history of diseases of the skin and subcutaneous tissue: Secondary | ICD-10-CM | POA: Insufficient documentation

## 2014-08-21 DIAGNOSIS — R600 Localized edema: Secondary | ICD-10-CM | POA: Insufficient documentation

## 2014-08-21 DIAGNOSIS — I890 Lymphedema, not elsewhere classified: Secondary | ICD-10-CM | POA: Insufficient documentation

## 2014-08-21 DIAGNOSIS — Z8659 Personal history of other mental and behavioral disorders: Secondary | ICD-10-CM | POA: Insufficient documentation

## 2014-08-21 DIAGNOSIS — Z590 Homelessness: Secondary | ICD-10-CM | POA: Insufficient documentation

## 2014-08-21 DIAGNOSIS — R6883 Chills (without fever): Secondary | ICD-10-CM | POA: Insufficient documentation

## 2014-08-21 DIAGNOSIS — Z862 Personal history of diseases of the blood and blood-forming organs and certain disorders involving the immune mechanism: Secondary | ICD-10-CM | POA: Insufficient documentation

## 2014-08-21 DIAGNOSIS — Z792 Long term (current) use of antibiotics: Secondary | ICD-10-CM | POA: Insufficient documentation

## 2014-08-21 NOTE — ED Notes (Signed)
Patient presents with c/o right leg swelling  States he noticed it several days ago

## 2014-08-22 ENCOUNTER — Emergency Department (HOSPITAL_COMMUNITY)
Admission: EM | Admit: 2014-08-22 | Discharge: 2014-08-22 | Discharge status: Against Medical Advice | Payer: Self-pay | Attending: Emergency Medicine | Admitting: Emergency Medicine

## 2014-08-22 DIAGNOSIS — I890 Lymphedema, not elsewhere classified: Secondary | ICD-10-CM

## 2014-08-22 LAB — BASIC METABOLIC PANEL
Anion gap: 4 — ABNORMAL LOW (ref 5–15)
BUN: 21 mg/dL (ref 6–23)
CALCIUM: 8.9 mg/dL (ref 8.4–10.5)
CO2: 27 mmol/L (ref 19–32)
Chloride: 109 mmol/L (ref 96–112)
Creatinine, Ser: 1.39 mg/dL — ABNORMAL HIGH (ref 0.50–1.35)
GFR calc Af Amer: 70 mL/min — ABNORMAL LOW (ref >90)
GFR calc non Af Amer: 60 mL/min — ABNORMAL LOW (ref >90)
GLUCOSE: 84 mg/dL (ref 70–99)
Potassium: 3.6 mmol/L (ref 3.5–5.1)
Sodium: 140 mmol/L (ref 135–145)

## 2014-08-22 LAB — CBC WITH DIFFERENTIAL/PLATELET
BASOS PCT: 0 % (ref 0–1)
Basophils Absolute: 0 10*3/uL (ref 0.0–0.1)
EOS ABS: 0.1 10*3/uL (ref 0.0–0.7)
EOS PCT: 3 % (ref 0–5)
HEMATOCRIT: 36.9 % — AB (ref 39.0–52.0)
HEMOGLOBIN: 12.7 g/dL — AB (ref 13.0–17.0)
LYMPHS ABS: 1.9 10*3/uL (ref 0.7–4.0)
Lymphocytes Relative: 46 % (ref 12–46)
MCH: 31.5 pg (ref 26.0–34.0)
MCHC: 34.4 g/dL (ref 30.0–36.0)
MCV: 91.6 fL (ref 78.0–100.0)
MONO ABS: 0.4 10*3/uL (ref 0.1–1.0)
MONOS PCT: 10 % (ref 3–12)
NEUTROS PCT: 41 % — AB (ref 43–77)
Neutro Abs: 1.7 10*3/uL (ref 1.7–7.7)
Platelets: 197 10*3/uL (ref 150–400)
RBC: 4.03 MIL/uL — AB (ref 4.22–5.81)
RDW: 12.9 % (ref 11.5–15.5)
WBC: 4.1 10*3/uL (ref 4.0–10.5)

## 2014-08-22 MED ORDER — TRAMADOL HCL 50 MG PO TABS
50.0000 mg | ORAL_TABLET | Freq: Once | ORAL | Status: AC
Start: 2014-08-22 — End: 2014-08-22
  Administered 2014-08-22: 50 mg via ORAL
  Filled 2014-08-22: qty 1

## 2014-08-22 NOTE — ED Notes (Signed)
Patient standing up watching TV.

## 2014-08-22 NOTE — ED Provider Notes (Signed)
CSN: [id number]     Arrival date & time 08/21/14  2128 History   This chart was scribed for Julianne Rice, MD by Chester Holstein, ED Scribe. This patient was seen in room A10C/A10C and the patient's care was started at 3:03 AM.    Chief Complaint  Patient presents with  . Leg Pain     Patient is a 44 y.o. male presenting with leg pain. The history is provided by the patient. No language interpreter was used.  Leg Pain Associated symptoms: no fever    HPI Comments: Jonathan Meadows is a 44 y.o. male with PMHx of anemia, paranoid schizophrenia, and cellulitis who presents to the Emergency Department complaining of pain in right leg worsening 2 days ago. Pt with h/o chronic lymphedema of right leg. Pt was last seen in ED on 02/19/14 for similar symptoms and treated with clindamycin for possible cellulitis. Pt notes associated swelling from thigh to ankle and chills. Pt has not taken anything for relief.  Pt denies any fever, SOB, and chest pain.   Past Medical History  Diagnosis Date  . Anemia   . Paranoid schizophrenia   . Cellulitis   . Homelessness    History reviewed. No pertinent past surgical history. Family History  Problem Relation Age of Onset  . CAD      Neg HX  . Hypertension      Neg Hx  . Diabetes      Neg Hx  . Cancer      Neg Hx   History  Substance Use Topics  . Smoking status: Never Smoker   . Smokeless tobacco: Never Used  . Alcohol Use: No    Review of Systems  Constitutional: Positive for chills. Negative for fever.  Respiratory: Negative for shortness of breath.   Cardiovascular: Positive for leg swelling. Negative for chest pain and palpitations.  Gastrointestinal: Negative for nausea, vomiting and abdominal pain.  Skin: Negative for rash and wound.  Neurological: Negative for dizziness, weakness, light-headedness, numbness and headaches.  All other systems reviewed and are negative.     Allergies  Review of patient's allergies indicates no known  allergies.  Home Medications   Prior to Admission medications   Medication Sig Start Date End Date Taking? Authorizing Provider  clindamycin (CLEOCIN) 300 MG capsule Take 1 capsule (300 mg total) by mouth 4 (four) times daily. X 7 days 02/19/14   April K Palumbo-Rasch, MD  traMADol (ULTRAM) 50 MG tablet Take 1 tablet (50 mg total) by mouth every 6 (six) hours as needed. 02/19/14   April K Palumbo-Rasch, MD   BP 118/48 mmHg  Pulse 72  Temp(Src) 97.6 F (36.4 C) (Oral)  Resp 18  Ht 6\' 1"  (1.854 m)  Wt 182 lb 4.8 oz (82.691 kg)  BMI 24.06 kg/m2  SpO2 97% Physical Exam  Constitutional: He is oriented to person, place, and time. He appears well-developed and well-nourished. No distress.  HENT:  Head: Normocephalic and atraumatic.  Mouth/Throat: Oropharynx is clear and moist.  Eyes: Conjunctivae and EOM are normal. Pupils are equal, round, and reactive to light.  Neck: Normal range of motion. Neck supple.  Cardiovascular: Normal rate and regular rhythm.   Pulmonary/Chest: Effort normal and breath sounds normal. No respiratory distress. He has no wheezes. He has no rales. He exhibits no tenderness.  Abdominal: Soft. Bowel sounds are normal. He exhibits no distension and no mass. There is no tenderness. There is no rebound and no guarding.  Musculoskeletal: Normal range of  motion. He exhibits edema and tenderness.  Pronounced edema to the entire right lower extremity. Mild increase in warmth compared to the left. Distal pulses intact.  Neurological: He is alert and oriented to person, place, and time.  Skin: Skin is warm and dry. No rash noted. No erythema.  Psychiatric: He has a normal mood and affect. His behavior is normal.  Nursing note and vitals reviewed.   ED Course  Procedures (including critical care time) DIAGNOSTIC STUDIES: Oxygen Saturation is 97% on room air, normal by my interpretation.    COORDINATION OF CARE: 3:09 AM Discussed treatment plan with patient at beside, the  patient agrees with the plan and has no further questions at this time.   Labs Review Labs Reviewed - No data to display  Imaging Review No results found.   EKG Interpretation None      MDM   Final diagnoses:  None    I personally performed the services described in this documentation, which was scribed in my presence. The recorded information has been reviewed and is accurate.  Swelling to the extremity is chronic in nature. Patient states is worsened over the last few days. I have low suspicion for cellulitis. He has a normal white blood cell count. It has been several years since the patient has had a vascular study of the right lower extremity. We'll get a vascular Doppler study this morning. If negative the patient can be discharged home.  Patient signed out to oncoming physician pending Doppler ultrasound  Julianne Rice, MD 08/23/14 [phone]

## 2014-08-22 NOTE — ED Notes (Signed)
Patient being uncooperative.  Security present

## 2014-08-22 NOTE — ED Notes (Addendum)
Vascular reporting pt exam will be done at 0930. Pt made aware.

## 2014-08-22 NOTE — Discharge Instructions (Signed)
Lymphedema Lymphedema is a swelling caused by the abnormal collection of lymph under the skin. The lymph is fluid from the tissues in your body that travels in the lymphatic system. This system is part of the immune system that includes lymph nodes and vessels. The lymph vessels collect and carry the excess fluid, fats, proteins, and wastes from the tissues of the body to the bloodstream. This system also works to clean and remove bacteria and waste products from the body.  Lymphedema occurs when the lymphatic system is blocked. When the lymph vessels or lymph nodes are blocked or damaged, lymph does not drain properly. This causes abnormal build up of lymph. This leads to swelling in the arms or legs. Lymphedema cannot be cured by medicines. But the swelling can be reduced by physical methods. CAUSES  There are two types of lymphedema. Primary lymphedema is caused by the absence or abnormality of the lymph vessel at birth. It is also known as inherited lymphedema, which occurs rarely. Secondary or acquired lymphedema occurs when the lymph vessel is damaged or blocked. The causes of lymph vessel blockage are:   Skin infection like cellulites.  Infection by parasites (filariasis).  Injury.  Cancer.  Radiation therapy.  Formation of scar tissue.  Surgery. SYMPTOMS  The symptoms of lymphedema are:  Abnormal swelling of the arm or leg.  Heavy or tight feeling in your arm or leg.  Tight-fitting shoes or rings.  Redness of skin over the affected area.  Limited movement of the affected limb.  Some patients complain about sensitivity to touch and discomfort in the limb(s) affected. You may not have these symptoms immediately following injury. They usually appear within a few days or even years after injury. Inform your caregiver, if you have any of these symptoms. Early treatment can avoid further problems.  DIAGNOSIS  First, your caregiver will inquire about any surgery you have had or  medicines you are taking. He will then examine you. Your caregiver may order special imaging tests, such as:  Lymphoscintigraphy (a test in which a low dose of radioactive substance is injected to trace the flow of lymph through the lymph vessels).  MRI (imaging tests using magnetic fields).  Computed tomography (test using special cross-sectional X-rays).  Duplex ultrasound (test using high-frequency sound waves to show the vessels and the blood flow on a screen).  Lymphangiography (special X-ray taken after injecting a contrast dye into the lymph vessel). It is now rarely done. TREATMENT  Lymphedema can be treated in different ways. Your caregiver will decide the type of treatment depending on the cause. Treatment may include:  Exercise: Special exercises will help fluid move out easily from the affected part. This should be done as per your caregiver's advice.  Manual lymph drainage: Gentle massage of the affected limb makes the fluid to move out more freely.  Compression: Compression stockings or external pump apply pressure over the affected limb. This helps the fluid to move out from the arm or leg. Bandaging can also help to move the fluid out from the affected part. Your caregiver will decide the method that suits you the best.  Medicines: Your caregiver may prescribe antibiotics, if you have infection.  Surgery: Your caregiver may advise surgery for severe lymphedema. It is reserved for special cases when the patient has difficulty moving. Your surgeon may remove excess tissue from the arm or leg. This will help to ease your movement. Physical therapy may have to be continued after surgery. HOME CARE INSTRUCTIONS    The area is very fragile and is predisposed to injury and infection.  Eat a healthy diet.  Exercise regularly as per advice.  Keep the affected area clean and dry.  Use gloves while cooking or gardening.  Protect your skin from cuts.  Use electric razor to  shave the affected area.  Keep affected limb elevated.  Do not wear tight clothes, shoes, or jewelry as it may cause the tissue to be strangled.  Do not use heat pads over the affected area.  Do not sit with cross legs.  Do not walk barefoot.  Do not carry weight on the affected arm.  Avoid having blood pressure checked on the affected limb. SEEK MEDICAL CARE IF:  You continue to have swelling in your limb. SEEK IMMEDIATE MEDICAL CARE IF:   You have high fever.  You have skin rash.  You have chills or sweats.  You have pain or redness.  You have a cut that does not heal. MAKE SURE YOU:   Understand these instructions.  Will watch your condition.  Will get help right away if you are not doing well or get worse. Document Released: 04/13/2007 Document Revised: 06/02/2012 Document Reviewed: 03/19/2009 [employer] Patient Information 2015 [employer]. This information is not intended to replace advice given to you by your health care provider. Make sure you discuss any questions you have with your health care provider.  

## 2014-08-22 NOTE — ED Notes (Signed)
Called at triage 2x. No answer

## 2014-08-22 NOTE — ED Notes (Signed)
Patient laying in the bed with his feet at the head of the bed

## 2014-08-22 NOTE — ED Notes (Signed)
Patient came back to [facility] and stated he was asleep in corner and did not hear his name called

## 2014-08-22 NOTE — ED Provider Notes (Signed)
  Physical Exam  BP 114/67 mmHg  Pulse 70  Temp(Src) 97.9 F (36.6 C) (Oral)  Resp 24  Ht 6\' 1"  (1.854 m)  Wt 182 lb 4.8 oz (82.691 kg)  BMI 24.06 kg/m2  SpO2 100%  Physical Exam  ED Course  Procedures  MDM Patient agitated. Didn't want to wait for doppler. Will AMA.   Wandra Arthurs, MD 08/22/14 [phone]

## 2014-08-22 NOTE — ED Notes (Addendum)
Pt slamming door, yelling in hallway. Uncooperative. Pt has been updated throughout stay in ED. CN to bedside, pt took all belongings. Pt ambulatory to discharge. In NAD. Refusing to sign AMA signature pad.

## 2014-08-22 NOTE — ED Notes (Signed)
Pt lying upside down in bed. Using phone. Call light at bedside. Provided coffee.

## 2014-08-30 ENCOUNTER — Emergency Department (HOSPITAL_BASED_OUTPATIENT_CLINIC_OR_DEPARTMENT_OTHER): Payer: Self-pay

## 2014-08-30 ENCOUNTER — Emergency Department (HOSPITAL_BASED_OUTPATIENT_CLINIC_OR_DEPARTMENT_OTHER)
Admission: EM | Admit: 2014-08-30 | Discharge: 2014-08-30 | Disposition: A | Discharge status: Home / Self Care | Payer: Self-pay | Attending: Emergency Medicine | Admitting: Emergency Medicine

## 2014-08-30 DIAGNOSIS — R609 Edema, unspecified: Secondary | ICD-10-CM | POA: Insufficient documentation

## 2014-08-30 DIAGNOSIS — Z792 Long term (current) use of antibiotics: Secondary | ICD-10-CM | POA: Insufficient documentation

## 2014-08-30 DIAGNOSIS — Z79899 Other long term (current) drug therapy: Secondary | ICD-10-CM | POA: Insufficient documentation

## 2014-08-30 DIAGNOSIS — Z8659 Personal history of other mental and behavioral disorders: Secondary | ICD-10-CM | POA: Insufficient documentation

## 2014-08-30 DIAGNOSIS — Z590 Homelessness: Secondary | ICD-10-CM | POA: Insufficient documentation

## 2014-08-30 DIAGNOSIS — Z862 Personal history of diseases of the blood and blood-forming organs and certain disorders involving the immune mechanism: Secondary | ICD-10-CM | POA: Insufficient documentation

## 2014-08-30 DIAGNOSIS — Z872 Personal history of diseases of the skin and subcutaneous tissue: Secondary | ICD-10-CM | POA: Insufficient documentation

## 2014-08-30 DIAGNOSIS — R600 Localized edema: Secondary | ICD-10-CM

## 2014-08-30 DIAGNOSIS — M7989 Other specified soft tissue disorders: Secondary | ICD-10-CM

## 2014-08-30 LAB — CBC WITH DIFFERENTIAL/PLATELET
Basophils Absolute: 0 10*3/uL (ref 0.0–0.1)
Basophils Relative: 1 % (ref 0–1)
Eosinophils Absolute: 0.1 10*3/uL (ref 0.0–0.7)
Eosinophils Relative: 2 % (ref 0–5)
HEMATOCRIT: 33.1 % — AB (ref 39.0–52.0)
HEMOGLOBIN: 11.6 g/dL — AB (ref 13.0–17.0)
LYMPHS PCT: 35 % (ref 12–46)
Lymphs Abs: 1.8 10*3/uL (ref 0.7–4.0)
MCH: 32.7 pg (ref 26.0–34.0)
MCHC: 35 g/dL (ref 30.0–36.0)
MCV: 93.2 fL (ref 78.0–100.0)
MONO ABS: 0.3 10*3/uL (ref 0.1–1.0)
Monocytes Relative: 6 % (ref 3–12)
NEUTROS ABS: 2.9 10*3/uL (ref 1.7–7.7)
Neutrophils Relative %: 56 % (ref 43–77)
PLATELETS: 179 10*3/uL (ref 150–400)
RBC: 3.55 MIL/uL — ABNORMAL LOW (ref 4.22–5.81)
RDW: 12.7 % (ref 11.5–15.5)
WBC: 5.1 10*3/uL (ref 4.0–10.5)

## 2014-08-30 LAB — BASIC METABOLIC PANEL
Anion gap: 2 — ABNORMAL LOW (ref 5–15)
BUN: 18 mg/dL (ref 6–23)
CO2: 27 mmol/L (ref 19–32)
CREATININE: 1.08 mg/dL (ref 0.50–1.35)
Calcium: 8.3 mg/dL — ABNORMAL LOW (ref 8.4–10.5)
Chloride: 109 mmol/L (ref 96–112)
GFR calc Af Amer: >90 mL/min (ref >90)
GFR, EST NON AFRICAN AMERICAN: 82 mL/min — AB (ref >90)
Glucose, Bld: 110 mg/dL — ABNORMAL HIGH (ref 70–99)
Potassium: 3.7 mmol/L (ref 3.5–5.1)
Sodium: 138 mmol/L (ref 135–145)

## 2014-08-30 MED ORDER — OXYCODONE-ACETAMINOPHEN 5-325 MG PO TABS
1.0000 | ORAL_TABLET | Freq: Once | ORAL | Status: AC
  Administered 2014-08-30: 1 via ORAL
  Filled 2014-08-30: qty 1

## 2014-08-30 MED ORDER — TRAMADOL HCL 50 MG PO TABS: 50.0000 mg | ORAL_TABLET | Freq: Four times a day (QID) | ORAL | Status: DC | PRN

## 2014-08-30 NOTE — ED Notes (Signed)
Pt states pain and swelling in right leg. started 2 days ago.

## 2014-08-30 NOTE — Discharge Instructions (Signed)
Peripheral Edema You have swelling in your legs (peripheral edema). This swelling is due to excess accumulation of salt and water in your body. Edema may be a sign of heart, kidney or liver disease, or a side effect of a medication. It may also be due to problems in the leg veins. Elevating your legs and using special support stockings may be very helpful, if the cause of the swelling is due to poor venous circulation. Avoid long periods of standing, whatever the cause. Treatment of edema depends on identifying the cause. Chips, pretzels, pickles and other salty foods should be avoided. Restricting salt in your diet is almost always needed. Water pills (diuretics) are often used to remove the excess salt and water from your body via urine. These medicines prevent the kidney from reabsorbing sodium. This increases urine flow. Diuretic treatment may also result in lowering of potassium levels in your body. Potassium supplements may be needed if you have to use diuretics daily. Daily weights can help you keep track of your progress in clearing your edema. You should call your caregiver for follow up care as recommended. SEEK IMMEDIATE MEDICAL CARE IF:   You have increased swelling, pain, redness, or heat in your legs.  You develop shortness of breath, especially when lying down.  You develop chest or abdominal pain, weakness, or fainting.  You have a fever. Document Released: 07/24/2004 Document Revised: 09/08/2011 Document Reviewed: 07/04/2009 [facility] Patient Information 2015 [place], [place]. This information is not intended to replace advice given to you by your health care provider. Make sure you discuss any questions you have with your health care provider.    Lymphedema Lymphedema is a swelling caused by the abnormal collection of lymph under the skin. The lymph is fluid from the tissues in your body that travels in the lymphatic system. This system is part of the immune system that includes  lymph nodes and vessels. The lymph vessels collect and carry the excess fluid, fats, proteins, and wastes from the tissues of the body to the bloodstream. This system also works to clean and remove bacteria and waste products from the body.  Lymphedema occurs when the lymphatic system is blocked. When the lymph vessels or lymph nodes are blocked or damaged, lymph does not drain properly. This causes abnormal build up of lymph. This leads to swelling in the arms or legs. Lymphedema cannot be cured by medicines. But the swelling can be reduced by physical methods. CAUSES  There are two types of lymphedema. Primary lymphedema is caused by the absence or abnormality of the lymph vessel at birth. It is also known as inherited lymphedema, which occurs rarely. Secondary or acquired lymphedema occurs when the lymph vessel is damaged or blocked. The causes of lymph vessel blockage are:   Skin infection like cellulites.  Infection by parasites (filariasis).  Injury.  Cancer.  Radiation therapy.  Formation of scar tissue.  Surgery. SYMPTOMS  The symptoms of lymphedema are:  Abnormal swelling of the arm or leg.  Heavy or tight feeling in your arm or leg.  Tight-fitting shoes or rings.  Redness of skin over the affected area.  Limited movement of the affected limb.  Some patients complain about sensitivity to touch and discomfort in the limb(s) affected. You may not have these symptoms immediately following injury. They usually appear within a few days or even years after injury. Inform your caregiver, if you have any of these symptoms. Early treatment can avoid further problems.  DIAGNOSIS  First, your  caregiver will inquire about any surgery you have had or medicines you are taking. He will then examine you. Your caregiver may order special imaging tests, such as:  Lymphoscintigraphy (a test in which a low dose of radioactive substance is injected to trace the flow of lymph through the lymph  vessels).  MRI (imaging tests using magnetic fields).  Computed tomography (test using special cross-sectional X-rays).  Duplex ultrasound (test using high-frequency sound waves to show the vessels and the blood flow on a screen).  Lymphangiography (special X-ray taken after injecting a contrast dye into the lymph vessel). It is now rarely done. TREATMENT  Lymphedema can be treated in different ways. Your caregiver will decide the type of treatment depending on the cause. Treatment may include:  Exercise: Special exercises will help fluid move out easily from the affected part. This should be done as per your caregiver's advice.  Manual lymph drainage: Gentle massage of the affected limb makes the fluid to move out more freely.  Compression: Compression stockings or external pump apply pressure over the affected limb. This helps the fluid to move out from the arm or leg. Bandaging can also help to move the fluid out from the affected part. Your caregiver will decide the method that suits you the best.  Medicines: Your caregiver may prescribe antibiotics, if you have infection.  Surgery: Your caregiver may advise surgery for severe lymphedema. It is reserved for special cases when the patient has difficulty moving. Your surgeon may remove excess tissue from the arm or leg. This will help to ease your movement. Physical therapy may have to be continued after surgery. HOME CARE INSTRUCTIONS  The area is very fragile and is predisposed to injury and infection.  Eat a healthy diet.  Exercise regularly as per advice.  Keep the affected area clean and dry.  Use gloves while cooking or gardening.  Protect your skin from cuts.  Use electric razor to shave the affected area.  Keep affected limb elevated.  Do not wear tight clothes, shoes, or jewelry as it may cause the tissue to be strangled.  Do not use heat pads over the affected area.  Do not sit with cross legs.  Do not walk  barefoot.  Do not carry weight on the affected arm.  Avoid having blood pressure checked on the affected limb. SEEK MEDICAL CARE IF:  You continue to have swelling in your limb. SEEK IMMEDIATE MEDICAL CARE IF:   You have high fever.  You have skin rash.  You have chills or sweats.  You have pain or redness.  You have a cut that does not heal. MAKE SURE YOU:   Understand these instructions.  Will watch your condition.  Will get help right away if you are not doing well or get worse. Document Released: 04/13/2007 Document Revised: 06/02/2012 Document Reviewed: 03/19/2009 [facility] Patient Information 2015 [place], [place]. This information is not intended to replace advice given to you by your health care provider. Make sure you discuss any questions you have with your health care provider.   Chronic Pain Chronic pain can be defined as pain that is off and on and lasts for 3-6 months or longer. Many things cause chronic pain, which can make it difficult to make a diagnosis. There are many treatment options available for chronic pain. However, finding a treatment that works well for you may require trying various approaches until the right one is found. Many people benefit from a combination of two or more  types of treatment to control their pain. SYMPTOMS  Chronic pain can occur anywhere in the body and can range from mild to very severe. Some types of chronic pain include:  Headache.  Low back pain.  Cancer pain.  Arthritis pain.  Neurogenic pain. This is pain resulting from damage to nerves. People with chronic pain may also have other symptoms such as:  Depression.  Anger.  Insomnia.  Anxiety. DIAGNOSIS  Your health care provider will help diagnose your condition over time. In many cases, the initial focus will be on excluding possible conditions that could be causing the pain. Depending on your symptoms, your health care provider may order tests to diagnose your  condition. Some of these tests may include:   Blood tests.   CT scan.   MRI.   X-rays.   Ultrasounds.   Nerve conduction studies.  You may need to see a specialist.  TREATMENT  Finding treatment that works well may take time. You may be referred to a pain specialist. He or she may prescribe medicine or therapies, such as:   Mindful meditation or yoga.  Shots (injections) of numbing or pain-relieving medicines into the spine or area of pain.  Local electrical stimulation.  Acupuncture.   Massage therapy.   Aroma, color, light, or sound therapy.   Biofeedback.   Working with a physical therapist to keep from getting stiff.   Regular, gentle exercise.   Cognitive or behavioral therapy.   Group support.  Sometimes, surgery may be recommended.  HOME CARE INSTRUCTIONS   Take all medicines as directed by your health care provider.   Lessen stress in your life by relaxing and doing things such as listening to calming music.   Exercise or be active as directed by your health care provider.   Eat a healthy diet and include things such as vegetables, fruits, fish, and lean meats in your diet.   Keep all follow-up appointments with your health care provider.   Attend a support group with others suffering from chronic pain. SEEK MEDICAL CARE IF:   Your pain gets worse.   You develop a new pain that was not there before.   You cannot tolerate medicines given to you by your health care provider.   You have new symptoms since your last visit with your health care provider.  SEEK IMMEDIATE MEDICAL CARE IF:   You feel weak.   You have decreased sensation or numbness.   You lose control of bowel or bladder function.   Your pain suddenly gets much worse.   You develop shaking.  You develop chills.  You develop confusion.  You develop chest pain.  You develop shortness of breath.  MAKE SURE YOU:  Understand these  instructions.  Will watch your condition.  Will get help right away if you are not doing well or get worse. Document Released: 03/08/2002 Document Revised: 02/16/2013 Document Reviewed: 12/10/2012 [facility] Patient Information 2015 [place], [place]. This information is not intended to replace advice given to you by your health care provider. Make sure you discuss any questions you have with your health care provider.

## 2014-08-30 NOTE — ED Provider Notes (Signed)
CSN: [id number]     Arrival date & time 08/30/14  1950 History  This chart was scribed for Ephraim Hamburger, MD by Dellis Filbert, ED Scribe. The patient was seen in MH07/MH07 and the patient's care was started at 8:38 PM.   Chief Complaint  Patient presents with  . Leg Pain    Patient is a 44 y.o. male presenting with leg pain. The history is provided by the patient. No language interpreter was used.  Leg Pain Associated symptoms: no fever    HPI Comments: Jonathan Meadows is a 44 y.o. male who presents to the Emergency Department complaining of swelling and pain in his right leg for the past two days. He has chronic lymphedema of the right leg. Pt has been seen in the ED for this complaint several times. He states he is typically given percocet for the pain and told to keep his leg elevated. He has no PCP but has an appointment scheduled with adult pediatrics next month. No fever or SOB. No redness.  Past Medical History  Diagnosis Date  . Anemia   . Paranoid schizophrenia   . Cellulitis   . Homelessness    No past surgical history on file. Family History  Problem Relation Age of Onset  . CAD      Neg HX  . Hypertension      Neg Hx  . Diabetes      Neg Hx  . Cancer      Neg Hx   History  Substance Use Topics  . Smoking status: Never Smoker   . Smokeless tobacco: Never Used  . Alcohol Use: No    Review of Systems  Constitutional: Negative for fever.  Respiratory: Negative for shortness of breath.   Cardiovascular: Positive for leg swelling. Negative for chest pain.  Skin: Negative for color change and wound.  All other systems reviewed and are negative.   Allergies  Review of patient's allergies indicates no known allergies.  Home Medications   Prior to Admission medications   Medication Sig Start Date End Date Taking? Authorizing Provider  clindamycin (CLEOCIN) 300 MG capsule Take 1 capsule (300 mg total) by mouth 4 (four) times daily. X 7 days Patient not  taking: Reported on 08/22/2014 02/19/14   April K Palumbo-Rasch, MD  traMADol (ULTRAM) 50 MG tablet Take 1 tablet (50 mg total) by mouth every 6 (six) hours as needed. Patient not taking: Reported on 08/22/2014 02/19/14   April K Palumbo-Rasch, MD   BP 126/71 mmHg  Pulse 81  Temp(Src) 98.3 F (36.8 C) (Oral)  Resp 16  Ht 6\' 1"  (1.854 m)  Wt 180 lb (81.647 kg)  BMI 23.75 kg/m2  SpO2 99% Physical Exam  Constitutional: He is oriented to person, place, and time. He appears well-developed and well-nourished. No distress.  HENT:  Head: Normocephalic and atraumatic.  Eyes: Right eye exhibits no discharge. Left eye exhibits no discharge.  Cardiovascular: Normal rate, regular rhythm and intact distal pulses.   Pulses:      Dorsalis pedis pulses are 2+ on the right side.  Pulmonary/Chest: Effort normal. No respiratory distress.  Abdominal: He exhibits no distension.  Musculoskeletal: He exhibits edema.  Diffusely swollen right lower extremity no focal tenderness or warmth.  Neurological: He is alert and oriented to person, place, and time.  Normal strength and sensation in RLE  Skin: Skin is warm and dry. No erythema.  Psychiatric: He has a normal mood and affect. His behavior is normal.  Nursing note and vitals reviewed.   ED Course  Procedures  DIAGNOSTIC STUDIES: Oxygen Saturation is 99% on room air, normal by my interpretation.    COORDINATION OF CARE: 8:41 PM Discussed treatment plan with pt at bedside and pt agreed to plan.  Labs Review Labs Reviewed  BASIC METABOLIC PANEL - Abnormal; Notable for the following:    Glucose, Bld 110 (*)    Calcium 8.3 (*)    GFR calc non Af Amer 82 (*)    Anion gap 2 (*)    All other components within normal limits  CBC WITH DIFFERENTIAL/PLATELET - Abnormal; Notable for the following:    RBC 3.55 (*)    Hemoglobin 11.6 (*)    HCT 33.1 (*)    All other components within normal limits    Imaging Review US Venous Img Lower Unilateral  Right  08/30/2014   CLINICAL DATA:  Right lower extremity pain and swelling for 3 days. Chronic lymphedema of the right leg.  EXAM: Right LOWER EXTREMITY VENOUS DOPPLER ULTRASOUND  TECHNIQUE: Gray-scale sonography with graded compression, as well as color Doppler and duplex ultrasound were performed to evaluate the lower extremity deep venous systems from the level of the common femoral vein and including the common femoral, femoral, profunda femoral, popliteal and calf veins including the posterior tibial, peroneal and gastrocnemius veins when visible. The superficial great saphenous vein was also interrogated. Spectral Doppler was utilized to evaluate flow at rest and with distal augmentation maneuvers in the common femoral, femoral and popliteal veins.  COMPARISON:  None.  FINDINGS: Left Common Femoral Vein: Respiratory phasicity is normal and symmetric with the symptomatic side. No evidence of thrombus. Normal compressibility.  Visualization of venous compression in the right lower extremity is limited due to patient size, especially in the lower thigh and calf. Heavily relying on Doppler, there is no indication of deep venous thrombosis in the right common femoral vein, saphenous femoral junction, profunda femoral vein, femoral vein, popliteal vein, or calf veins. No evidence of great saphenous vein thrombus. Diffuse subcutaneous edema in the lower extremity.  IMPRESSION: No evidence of deep venous thrombosis in the right lower extremity.   Electronically Signed   By: Monte Fantasia M.D.   On: 08/30/2014 22:07     EKG Interpretation None      MDM   Final diagnoses:  Right leg swelling  Edema of right lower extremity    I believe this is a exacerbation of chronic RLE swelling. No signs of cellulitis, including normal WBC and no erythema, warmth or signs of abscess. Given that his leg is very large compared to normal LLE, DVT u/s obtained and is negative. Likely lymphedema. Patient does not have  uncontrollable pain, is asleep each time I enter room. Will give small Rx of narcotics, and recommend f/u with PCP.   I personally performed the services described in this documentation, which was scribed in my presence. The recorded information has been reviewed and is accurate.     Ephraim Hamburger, MD 08/30/14 [phone]

## 2014-09-19 ENCOUNTER — Encounter (HOSPITAL_BASED_OUTPATIENT_CLINIC_OR_DEPARTMENT_OTHER): Payer: Self-pay | Admitting: *Deleted

## 2014-09-19 ENCOUNTER — Emergency Department (HOSPITAL_BASED_OUTPATIENT_CLINIC_OR_DEPARTMENT_OTHER)
Admission: EM | Admit: 2014-09-19 | Discharge: 2014-09-20 | Discharge status: Against Medical Advice | Payer: Self-pay | Attending: Emergency Medicine | Admitting: Emergency Medicine

## 2014-09-19 DIAGNOSIS — R51 Headache: Secondary | ICD-10-CM | POA: Insufficient documentation

## 2014-09-19 NOTE — ED Notes (Signed)
Pt c/o HA x24 hours. Pt more concerned with a warm blanket than with being triaged.

## 2014-09-20 ENCOUNTER — Emergency Department (HOSPITAL_BASED_OUTPATIENT_CLINIC_OR_DEPARTMENT_OTHER)
Admission: EM | Admit: 2014-09-20 | Discharge: 2014-09-20 | Disposition: A | Discharge status: Home / Self Care | Payer: Self-pay | Attending: Emergency Medicine | Admitting: Emergency Medicine

## 2014-09-20 ENCOUNTER — Encounter (HOSPITAL_BASED_OUTPATIENT_CLINIC_OR_DEPARTMENT_OTHER): Payer: Self-pay | Admitting: Emergency Medicine

## 2014-09-20 DIAGNOSIS — I890 Lymphedema, not elsewhere classified: Secondary | ICD-10-CM | POA: Insufficient documentation

## 2014-09-20 DIAGNOSIS — Z590 Homelessness: Secondary | ICD-10-CM | POA: Insufficient documentation

## 2014-09-20 DIAGNOSIS — Z872 Personal history of diseases of the skin and subcutaneous tissue: Secondary | ICD-10-CM | POA: Insufficient documentation

## 2014-09-20 DIAGNOSIS — F419 Anxiety disorder, unspecified: Secondary | ICD-10-CM | POA: Insufficient documentation

## 2014-09-20 DIAGNOSIS — G8929 Other chronic pain: Secondary | ICD-10-CM | POA: Insufficient documentation

## 2014-09-20 DIAGNOSIS — Z862 Personal history of diseases of the blood and blood-forming organs and certain disorders involving the immune mechanism: Secondary | ICD-10-CM | POA: Insufficient documentation

## 2014-09-20 DIAGNOSIS — Z792 Long term (current) use of antibiotics: Secondary | ICD-10-CM | POA: Insufficient documentation

## 2014-09-20 DIAGNOSIS — Z79899 Other long term (current) drug therapy: Secondary | ICD-10-CM | POA: Insufficient documentation

## 2014-09-20 MED ORDER — HYDROCHLOROTHIAZIDE 25 MG PO TABS: 25.0000 mg | ORAL_TABLET | Freq: Every day | ORAL | Status: DC

## 2014-09-20 MED ORDER — OXYCODONE-ACETAMINOPHEN 5-325 MG PO TABS
ORAL_TABLET | ORAL | Status: AC
  Filled 2014-09-20: qty 1

## 2014-09-20 MED ORDER — OXYCODONE-ACETAMINOPHEN 5-325 MG PO TABS
1.0000 | ORAL_TABLET | Freq: Once | ORAL | Status: AC
  Administered 2014-09-20: 1 via ORAL

## 2014-09-20 NOTE — ED Notes (Signed)
Pt states that his right leg is more edematous than usual, reports chronic leg swelling but states that is feels different

## 2014-09-20 NOTE — ED Notes (Signed)
Pt requesting a "snack pack", he's hungry; pt notified he can't eat until the EDP see's him;

## 2014-09-20 NOTE — ED Provider Notes (Addendum)
CSN: [id number]     Arrival date & time 09/20/14  1829 History   First MD Initiated Contact with Patient 09/20/14 [phone]     Chief Complaint  Patient presents with  . Leg Swelling     (Consider location/radiation/quality/duration/timing/severity/associated sxs/prior Treatment) HPI  This is a 44 year old male with a long-standing history of chronic right lower extremity lymphedema. This is been documented as far back as 2009. He has never been diagnosed with a deep vein thrombosis and his most recent Doppler study on the second of this month was negative for DVT. He checked in here yesterday evening complaining of a headache but left without being seen because he wanted something to eat.   He returns complaining of increased pain and swelling in his right leg over the past 2 days. He attributes this to being off his "fluid pill" for several days. He is having moderate pain in it, worse with palpation or ambulation. There is no erythema or warmth. He is also requesting a refill of his fluid pill;  he mentions both hydrochlorothiazide and Lasix although a review of his records does not indicate he has been prescribed either by a [facility] facility in the past several years.  He is also requesting something for chronic anxiety and something for chronic pain. He is been given several prescriptions for Ultram in the past year but a view of the state narcotic database indicates he only had one of these filled, in January of this year.  Past Medical History  Diagnosis Date  . Anemia   . Paranoid schizophrenia   . Cellulitis   . Homelessness    History reviewed. No pertinent past surgical history. Family History  Problem Relation Age of Onset  . CAD      Neg HX  . Hypertension      Neg Hx  . Diabetes      Neg Hx  . Cancer      Neg Hx   History  Substance Use Topics  . Smoking status: Never Smoker   . Smokeless tobacco: Never Used  . Alcohol Use: No    Review of Systems  All other  systems reviewed and are negative.   Allergies  Review of patient's allergies indicates no known allergies.  Home Medications   Prior to Admission medications   Medication Sig Start Date End Date Taking? Authorizing Provider  benazepril-hydrochlorthiazide (LOTENSIN HCT) 10-12.5 MG per tablet Take 1 tablet by mouth daily.   Yes Historical Provider, MD  traMADol (ULTRAM) 50 MG tablet Take 1 tablet (50 mg total) by mouth every 6 (six) hours as needed. 08/30/14  Yes Sherwood Gambler, MD  clindamycin (CLEOCIN) 300 MG capsule Take 1 capsule (300 mg total) by mouth 4 (four) times daily. X 7 days Patient not taking: Reported on 08/22/2014 02/19/14   April Palumbo, MD   BP 120/69 mmHg  Pulse 77  Temp(Src) 97.7 F (36.5 C) (Oral)  Resp 16  Ht 6\' 1"  (1.854 m)  Wt 180 lb (81.647 kg)  BMI 23.75 kg/m2  SpO2 100%   Physical Exam General: Well-developed, well-nourished male in no acute distress; appearance consistent with age of record HENT: normocephalic; atraumatic; pharynx normal Eyes: pupils equal, round and reactive to light; extraocular muscles intact Neck: supple Heart: regular rate and rhythm Lungs: clear to auscultation bilaterally Abdomen: soft; nondistended; nontender; bowel sounds present Extremities: Chronic appearing lymphedema of right lower extremity with generalized tenderness but without focal warmth or erythema Neurologic: Awake, alert; motor function  intact in all extremities and symmetric; no facial droop Skin: Warm and dry Psychiatric: Flat affect; poverty of speech; cooperative    ED Course  Procedures (including critical care time)   MDM  The patient was advised that chronic pain and chronic anxiety need to be managed by a primary care physician. It is unclear if he has primary care physician. We will provide referrals for primary care and behavioral health issues.  He does not appear to have an acute cellulitis at this time. As noted above he has had a recent Doppler  ultrasound which is negative for DVT, nor has he ever been diagnosed with DVT.     Shanon Rosser, MD 09/20/14 [place], MD 09/20/14 [id number]  Shanon Rosser, MD 09/20/14 [id number]

## 2014-09-20 NOTE — Discharge Instructions (Signed)
Lymphedema Lymphedema is a swelling caused by the abnormal collection of lymph under the skin. The lymph is fluid from the tissues in your body that travels in the lymphatic system. This system is part of the immune system that includes lymph nodes and vessels. The lymph vessels collect and carry the excess fluid, fats, proteins, and wastes from the tissues of the body to the bloodstream. This system also works to clean and remove bacteria and waste products from the body.  Lymphedema occurs when the lymphatic system is blocked. When the lymph vessels or lymph nodes are blocked or damaged, lymph does not drain properly. This causes abnormal build up of lymph. This leads to swelling in the arms or legs. Lymphedema cannot be cured by medicines. But the swelling can be reduced by physical methods. CAUSES  There are two types of lymphedema. Primary lymphedema is caused by the absence or abnormality of the lymph vessel at birth. It is also known as inherited lymphedema, which occurs rarely. Secondary or acquired lymphedema occurs when the lymph vessel is damaged or blocked. The causes of lymph vessel blockage are:   Skin infection like cellulites.  Infection by parasites (filariasis).  Injury.  Cancer.  Radiation therapy.  Formation of scar tissue.  Surgery. SYMPTOMS  The symptoms of lymphedema are:  Abnormal swelling of the arm or leg.  Heavy or tight feeling in your arm or leg.  Tight-fitting shoes or rings.  Redness of skin over the affected area.  Limited movement of the affected limb.  Some patients complain about sensitivity to touch and discomfort in the limb(s) affected. You may not have these symptoms immediately following injury. They usually appear within a few days or even years after injury. Inform your caregiver, if you have any of these symptoms. Early treatment can avoid further problems.  DIAGNOSIS  First, your caregiver will inquire about any surgery you have had or  medicines you are taking. He will then examine you. Your caregiver may order special imaging tests, such as:  Lymphoscintigraphy (a test in which a low dose of radioactive substance is injected to trace the flow of lymph through the lymph vessels).  MRI (imaging tests using magnetic fields).  Computed tomography (test using special cross-sectional X-rays).  Duplex ultrasound (test using high-frequency sound waves to show the vessels and the blood flow on a screen).  Lymphangiography (special X-ray taken after injecting a contrast dye into the lymph vessel). It is now rarely done. TREATMENT  Lymphedema can be treated in different ways. Your caregiver will decide the type of treatment depending on the cause. Treatment may include:  Exercise: Special exercises will help fluid move out easily from the affected part. This should be done as per your caregiver's advice.  Manual lymph drainage: Gentle massage of the affected limb makes the fluid to move out more freely.  Compression: Compression stockings or external pump apply pressure over the affected limb. This helps the fluid to move out from the arm or leg. Bandaging can also help to move the fluid out from the affected part. Your caregiver will decide the method that suits you the best.  Medicines: Your caregiver may prescribe antibiotics, if you have infection.  Surgery: Your caregiver may advise surgery for severe lymphedema. It is reserved for special cases when the patient has difficulty moving. Your surgeon may remove excess tissue from the arm or leg. This will help to ease your movement. Physical therapy may have to be continued after surgery. HOME CARE INSTRUCTIONS    The area is very fragile and is predisposed to injury and infection.  Eat a healthy diet.  Exercise regularly as per advice.  Keep the affected area clean and dry.  Use gloves while cooking or gardening.  Protect your skin from cuts.  Use electric razor to  shave the affected area.  Keep affected limb elevated.  Do not wear tight clothes, shoes, or jewelry as it may cause the tissue to be strangled.  Do not use heat pads over the affected area.  Do not sit with cross legs.  Do not walk barefoot.  Do not carry weight on the affected arm.  Avoid having blood pressure checked on the affected limb. SEEK MEDICAL CARE IF:  You continue to have swelling in your limb. SEEK IMMEDIATE MEDICAL CARE IF:   You have high fever.  You have skin rash.  You have chills or sweats.  You have pain or redness.  You have a cut that does not heal. MAKE SURE YOU:   Understand these instructions.  Will watch your condition.  Will get help right away if you are not doing well or get worse. Document Released: 04/13/2007 Document Revised: 06/02/2012 Document Reviewed: 03/19/2009 [employer] Patient Information 2015 [employer]. This information is not intended to replace advice given to you by your health care provider. Make sure you discuss any questions you have with your health care provider.  

## 2014-09-20 NOTE — ED Notes (Signed)
Pt is variant on when and what symptoms he has been experiencing, states that he has not had his "fluid pill" for several days and that edema began 3 days ago. Pt ambulating without difficulty, reports pain waxes and wanes,

## 2014-09-20 NOTE — ED Notes (Signed)
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

## 2014-10-31 ENCOUNTER — Encounter (HOSPITAL_COMMUNITY): Payer: Self-pay | Admitting: Emergency Medicine

## 2014-10-31 ENCOUNTER — Emergency Department (HOSPITAL_COMMUNITY)
Admission: EM | Admit: 2014-10-31 | Discharge: 2014-11-01 | Disposition: A | Discharge status: Home / Self Care | Payer: Self-pay | Attending: Emergency Medicine | Admitting: Emergency Medicine

## 2014-10-31 DIAGNOSIS — R451 Restlessness and agitation: Secondary | ICD-10-CM | POA: Insufficient documentation

## 2014-10-31 DIAGNOSIS — Z79899 Other long term (current) drug therapy: Secondary | ICD-10-CM | POA: Insufficient documentation

## 2014-10-31 DIAGNOSIS — Z8659 Personal history of other mental and behavioral disorders: Secondary | ICD-10-CM | POA: Insufficient documentation

## 2014-10-31 DIAGNOSIS — Z590 Homelessness: Secondary | ICD-10-CM | POA: Insufficient documentation

## 2014-10-31 DIAGNOSIS — R51 Headache: Secondary | ICD-10-CM | POA: Insufficient documentation

## 2014-10-31 DIAGNOSIS — Z862 Personal history of diseases of the blood and blood-forming organs and certain disorders involving the immune mechanism: Secondary | ICD-10-CM | POA: Insufficient documentation

## 2014-10-31 DIAGNOSIS — I890 Lymphedema, not elsewhere classified: Secondary | ICD-10-CM | POA: Insufficient documentation

## 2014-10-31 DIAGNOSIS — R6883 Chills (without fever): Secondary | ICD-10-CM | POA: Insufficient documentation

## 2014-10-31 DIAGNOSIS — Z872 Personal history of diseases of the skin and subcutaneous tissue: Secondary | ICD-10-CM | POA: Insufficient documentation

## 2014-10-31 HISTORY — DX: Lymphedema, not elsewhere classified: I89.0

## 2014-10-31 LAB — CBC WITH DIFFERENTIAL/PLATELET
BASOS ABS: 0 10*3/uL (ref 0.0–0.1)
Basophils Relative: 1 % (ref 0–1)
Eosinophils Absolute: 0.2 10*3/uL (ref 0.0–0.7)
Eosinophils Relative: 4 % (ref 0–5)
HCT: 34.3 % — ABNORMAL LOW (ref 39.0–52.0)
HEMOGLOBIN: 11.7 g/dL — AB (ref 13.0–17.0)
LYMPHS PCT: 43 % (ref 12–46)
Lymphs Abs: 2.6 10*3/uL (ref 0.7–4.0)
MCH: 32.1 pg (ref 26.0–34.0)
MCHC: 34.1 g/dL (ref 30.0–36.0)
MCV: 94 fL (ref 78.0–100.0)
MONO ABS: 0.5 10*3/uL (ref 0.1–1.0)
Monocytes Relative: 8 % (ref 3–12)
Neutro Abs: 2.8 10*3/uL (ref 1.7–7.7)
Neutrophils Relative %: 46 % (ref 43–77)
Platelets: 238 10*3/uL (ref 150–400)
RBC: 3.65 MIL/uL — ABNORMAL LOW (ref 4.22–5.81)
RDW: 13.1 % (ref 11.5–15.5)
WBC: 6.2 10*3/uL (ref 4.0–10.5)

## 2014-10-31 LAB — COMPREHENSIVE METABOLIC PANEL
ALBUMIN: 3 g/dL — AB (ref 3.5–5.0)
ALT: 18 U/L (ref 17–63)
AST: 29 U/L (ref 15–41)
Alkaline Phosphatase: 56 U/L (ref 38–126)
Anion gap: 7 (ref 5–15)
BILIRUBIN TOTAL: 0.6 mg/dL (ref 0.3–1.2)
BUN: 15 mg/dL (ref 6–20)
CHLORIDE: 108 mmol/L (ref 101–111)
CO2: 24 mmol/L (ref 22–32)
Calcium: 8.4 mg/dL — ABNORMAL LOW (ref 8.9–10.3)
Creatinine, Ser: 1.21 mg/dL (ref 0.61–1.24)
GFR calc Af Amer: >60 mL/min (ref >60)
GFR calc non Af Amer: >60 mL/min (ref >60)
Glucose, Bld: 93 mg/dL (ref 70–99)
Potassium: 3.8 mmol/L (ref 3.5–5.1)
SODIUM: 139 mmol/L (ref 135–145)
Total Protein: 6.3 g/dL — ABNORMAL LOW (ref 6.5–8.1)

## 2014-10-31 NOTE — ED Provider Notes (Signed)
CSN: [id number]     Arrival date & time 10/31/14  2110 History   First MD Initiated Contact with Patient 10/31/14 2344     Chief Complaint  Patient presents with  . Leg Pain     (Consider location/radiation/quality/duration/timing/severity/associated sxs/prior Treatment) Patient is a 44 y.o. male presenting with leg pain. The history is provided by the patient. No language interpreter was used.  Leg Pain Associated symptoms comment:  Pain in right lower extremity that started today with swelling. No fever or chills. No history of blood clots.   HPI  Jonathan Meadows with a known hx of paranoid schizophrenia and lymphadema of the leg presents today with complaints of pain and swelling in his RLE.Marland Kitchen  He states this feels like previous episodes of lymphema flair up.  The pain started today and he states he ran out of his pain pills and fluid pills.  The pain is made worse with walking or touch.  He also has a headache and chills, and is requesting a meal.  He denies chest pain, cardiac hx, SOB, dizziness, or weakness.   He has had no injuries to the leg.  He has not noticed in heat or redness in the leg.  He is not currently taking any medications.     Past Medical History  Diagnosis Date  . Anemia   . Paranoid schizophrenia   . Cellulitis   . Homelessness   . Lymphedema of leg    History reviewed. No pertinent past surgical history. Family History  Problem Relation Age of Onset  . CAD      Neg HX  . Hypertension      Neg Hx  . Diabetes      Neg Hx  . Cancer      Neg Hx   History  Substance Use Topics  . Smoking status: Never Smoker   . Smokeless tobacco: Never Used  . Alcohol Use: No    Review of Systems  Constitutional: Positive for chills.  Respiratory: Negative for shortness of breath.   Cardiovascular: Positive for leg swelling. Negative for chest pain.       Swelling and pain in RLE.  Gastrointestinal: Negative for nausea, vomiting and abdominal pain.  Musculoskeletal:        Difficulty walking d/t pain in RLE  Skin: Negative for color change and rash.  Neurological: Positive for headaches. Negative for dizziness.  Psychiatric/Behavioral: Positive for agitation.      Allergies  Review of patient's allergies indicates no known allergies.  Home Medications   Prior to Admission medications   Medication Sig Start Date End Date Taking? Authorizing Provider  hydrochlorothiazide (HYDRODIURIL) 25 MG tablet Take 1 tablet (25 mg total) by mouth daily. 09/20/14   John Molpus, MD   BP 124/87 mmHg  Pulse 80  Temp(Src) 98.8 F (37.1 C)  Resp 16  Ht 6\' 1"  (1.854 m)  Wt 200 lb (90.719 kg)  BMI 26.39 kg/m2  SpO2 99% Physical Exam  Constitutional: He is oriented to person, place, and time. He appears well-developed and well-nourished. No distress.  Cardiovascular: Normal rate, regular rhythm, normal heart sounds and intact distal pulses.   Pulmonary/Chest: Effort normal and breath sounds normal.  Abdominal: Soft. Bowel sounds are normal. He exhibits no distension. There is no guarding.  Musculoskeletal: Normal range of motion. He exhibits edema and tenderness.  Swelling and tenderness to palpation in RLE, no pain or swelling in LLE.  No erythema in either lower extremity. Strength normal without  deficit bilaterally upper and lower extremities.  Neurological: He is alert and oriented to person, place, and time. He has normal reflexes.  Skin: Skin is warm and dry. No rash noted. No erythema.  Psychiatric: His affect is angry. He is agitated.    ED Course  Procedures (including critical care time) Labs Review Labs Reviewed  COMPREHENSIVE METABOLIC PANEL - Abnormal; Notable for the following:    Calcium 8.4 (*)    Total Protein 6.3 (*)    Albumin 3.0 (*)    All other components within normal limits  CBC WITH DIFFERENTIAL/PLATELET - Abnormal; Notable for the following:    RBC 3.65 (*)    Hemoglobin 11.7 (*)    HCT 34.3 (*)    All other components within  normal limits   Results for orders placed or performed during the hospital encounter of 10/31/14  Comprehensive metabolic panel  Result Value Ref Range   Sodium 139 135 - 145 mmol/L   Potassium 3.8 3.5 - 5.1 mmol/L   Chloride 108 101 - 111 mmol/L   CO2 24 22 - 32 mmol/L   Glucose, Bld 93 70 - 99 mg/dL   BUN 15 6 - 20 mg/dL   Creatinine, Ser 1.21 0.61 - 1.24 mg/dL   Calcium 8.4 (L) 8.9 - 10.3 mg/dL   Total Protein 6.3 (L) 6.5 - 8.1 g/dL   Albumin 3.0 (L) 3.5 - 5.0 g/dL   AST 29 15 - 41 U/L   ALT 18 17 - 63 U/L   Alkaline Phosphatase 56 38 - 126 U/L   Total Bilirubin 0.6 0.3 - 1.2 mg/dL   GFR calc non Af Amer >60 >60 mL/min   GFR calc Af Amer >60 >60 mL/min   Anion gap 7 5 - 15  CBC with Differential  Result Value Ref Range   WBC 6.2 4.0 - 10.5 K/uL   RBC 3.65 (L) 4.22 - 5.81 MIL/uL   Hemoglobin 11.7 (L) 13.0 - 17.0 g/dL   HCT 34.3 (L) 39.0 - 52.0 %   MCV 94.0 78.0 - 100.0 fL   MCH 32.1 26.0 - 34.0 pg   MCHC 34.1 30.0 - 36.0 g/dL   RDW 13.1 11.5 - 15.5 %   Platelets 238 150 - 400 K/uL   Neutrophils Relative % 46 43 - 77 %   Neutro Abs 2.8 1.7 - 7.7 K/uL   Lymphocytes Relative 43 12 - 46 %   Lymphs Abs 2.6 0.7 - 4.0 K/uL   Monocytes Relative 8 3 - 12 %   Monocytes Absolute 0.5 0.1 - 1.0 K/uL   Eosinophils Relative 4 0 - 5 %   Eosinophils Absolute 0.2 0.0 - 0.7 K/uL   Basophils Relative 1 0 - 1 %   Basophils Absolute 0.0 0.0 - 0.1 K/uL    Imaging Review No results found.   EKG Interpretation None      MDM   Final diagnoses:  None    1. Chronic lymphedema  He will be referred for primary care management of chronic symptoms.   Very agitated to questioning and refusing EKG leads.  Only wants the pulse oximeter on his right hand because "electricity shoots out of the left hand."    Charlann Lange, PA-C 11/01/14 0028  Ripley Fraise, MD 11/01/14 [phone]

## 2014-10-31 NOTE — ED Notes (Signed)
Pt. reports right leg pain and mild chills onset this week , denies injury /ambulatory. No fever.

## 2014-11-01 NOTE — Discharge Instructions (Signed)
Lymphedema Lymphedema is a swelling caused by the abnormal collection of lymph under the skin. The lymph is fluid from the tissues in your body that travels in the lymphatic system. This system is part of the immune system that includes lymph nodes and vessels. The lymph vessels collect and carry the excess fluid, fats, proteins, and wastes from the tissues of the body to the bloodstream. This system also works to clean and remove bacteria and waste products from the body.  Lymphedema occurs when the lymphatic system is blocked. When the lymph vessels or lymph nodes are blocked or damaged, lymph does not drain properly. This causes abnormal build up of lymph. This leads to swelling in the arms or legs. Lymphedema cannot be cured by medicines. But the swelling can be reduced by physical methods. CAUSES  There are two types of lymphedema. Primary lymphedema is caused by the absence or abnormality of the lymph vessel at birth. It is also known as inherited lymphedema, which occurs rarely. Secondary or acquired lymphedema occurs when the lymph vessel is damaged or blocked. The causes of lymph vessel blockage are:   Skin infection like cellulites.  Infection by parasites (filariasis).  Injury.  Cancer.  Radiation therapy.  Formation of scar tissue.  Surgery. SYMPTOMS  The symptoms of lymphedema are:  Abnormal swelling of the arm or leg.  Heavy or tight feeling in your arm or leg.  Tight-fitting shoes or rings.  Redness of skin over the affected area.  Limited movement of the affected limb.  Some patients complain about sensitivity to touch and discomfort in the limb(s) affected. You may not have these symptoms immediately following injury. They usually appear within a few days or even years after injury. Inform your caregiver, if you have any of these symptoms. Early treatment can avoid further problems.  DIAGNOSIS  First, your caregiver will inquire about any surgery you have had or  medicines you are taking. He will then examine you. Your caregiver may order special imaging tests, such as:  Lymphoscintigraphy (a test in which a low dose of radioactive substance is injected to trace the flow of lymph through the lymph vessels).  MRI (imaging tests using magnetic fields).  Computed tomography (test using special cross-sectional X-rays).  Duplex ultrasound (test using high-frequency sound waves to show the vessels and the blood flow on a screen).  Lymphangiography (special X-ray taken after injecting a contrast dye into the lymph vessel). It is now rarely done. TREATMENT  Lymphedema can be treated in different ways. Your caregiver will decide the type of treatment depending on the cause. Treatment may include:  Exercise: Special exercises will help fluid move out easily from the affected part. This should be done as per your caregiver's advice.  Manual lymph drainage: Gentle massage of the affected limb makes the fluid to move out more freely.  Compression: Compression stockings or external pump apply pressure over the affected limb. This helps the fluid to move out from the arm or leg. Bandaging can also help to move the fluid out from the affected part. Your caregiver will decide the method that suits you the best.  Medicines: Your caregiver may prescribe antibiotics, if you have infection.  Surgery: Your caregiver may advise surgery for severe lymphedema. It is reserved for special cases when the patient has difficulty moving. Your surgeon may remove excess tissue from the arm or leg. This will help to ease your movement. Physical therapy may have to be continued after surgery. HOME CARE INSTRUCTIONS    The area is very fragile and is predisposed to injury and infection.  Eat a healthy diet.  Exercise regularly as per advice.  Keep the affected area clean and dry.  Use gloves while cooking or gardening.  Protect your skin from cuts.  Use electric razor to  shave the affected area.  Keep affected limb elevated.  Do not wear tight clothes, shoes, or jewelry as it may cause the tissue to be strangled.  Do not use heat pads over the affected area.  Do not sit with cross legs.  Do not walk barefoot.  Do not carry weight on the affected arm.  Avoid having blood pressure checked on the affected limb. SEEK MEDICAL CARE IF:  You continue to have swelling in your limb. SEEK IMMEDIATE MEDICAL CARE IF:   You have high fever.  You have skin rash.  You have chills or sweats.  You have pain or redness.  You have a cut that does not heal. MAKE SURE YOU:   Understand these instructions.  Will watch your condition.  Will get help right away if you are not doing well or get worse. Document Released: 04/13/2007 Document Revised: 06/02/2012 Document Reviewed: 03/19/2009 [employer] Patient Information 2015 [employer]. This information is not intended to replace advice given to you by your health care provider. Make sure you discuss any questions you have with your health care provider.  

## 2014-11-14 ENCOUNTER — Emergency Department (HOSPITAL_COMMUNITY)
Admission: EM | Admit: 2014-11-14 | Discharge: 2014-11-15 | Disposition: A | Discharge status: Home / Self Care | Payer: Self-pay | Attending: Emergency Medicine | Admitting: Emergency Medicine

## 2014-11-14 ENCOUNTER — Encounter (HOSPITAL_COMMUNITY): Payer: Self-pay | Admitting: Emergency Medicine

## 2014-11-14 DIAGNOSIS — Z8659 Personal history of other mental and behavioral disorders: Secondary | ICD-10-CM | POA: Insufficient documentation

## 2014-11-14 DIAGNOSIS — Z862 Personal history of diseases of the blood and blood-forming organs and certain disorders involving the immune mechanism: Secondary | ICD-10-CM | POA: Insufficient documentation

## 2014-11-14 DIAGNOSIS — Z590 Homelessness: Secondary | ICD-10-CM | POA: Insufficient documentation

## 2014-11-14 DIAGNOSIS — Z872 Personal history of diseases of the skin and subcutaneous tissue: Secondary | ICD-10-CM | POA: Insufficient documentation

## 2014-11-14 DIAGNOSIS — I890 Lymphedema, not elsewhere classified: Secondary | ICD-10-CM | POA: Insufficient documentation

## 2014-11-14 MED ORDER — TRAMADOL HCL 50 MG PO TABS
50.0000 mg | ORAL_TABLET | Freq: Once | ORAL | Status: AC
  Administered 2014-11-14: 50 mg via ORAL
  Filled 2014-11-14: qty 1

## 2014-11-14 NOTE — ED Provider Notes (Signed)
CSN: [id number]     Arrival date & time 11/14/14  2234 History  This chart was scribed for Dahlia Bailiff, PA-C, working with Rolland Porter, MD by Girtha Hake, ED Scribe. The patient was seen in TR03C/TR03C. The patient's care was started at 11:26 PM.   Chief Complaint  Patient presents with  . Leg Pain   Patient is a 44 y.o. male presenting with leg pain. The history is provided by the patient. No language interpreter was used.  Leg Pain Associated symptoms: no fever    HPI Comments: Jonathan Meadows is a 44 y.o. male who presents to the Emergency Department complaining of constant pain and swelling in his right foot and leg beginning two days ago. He states that he experiences chronic swelling in the foot and leg, but reports that it has been more severe in the past two days. He denies fever, nausea, vomiting, SOB, CP, or injury to the leg. He denies a history of DVT. Patient states that fluid pills have helped alleviate the swelling in the past, but he says that he does not have any currently.  Patient does not have a PCP.   Past Medical History  Diagnosis Date  . Anemia   . Paranoid schizophrenia   . Cellulitis   . Homelessness   . Lymphedema of leg    History reviewed. No pertinent past surgical history. Family History  Problem Relation Age of Onset  . CAD      Neg HX  . Hypertension      Neg Hx  . Diabetes      Neg Hx  . Cancer      Neg Hx   History  Substance Use Topics  . Smoking status: Never Smoker   . Smokeless tobacco: Never Used  . Alcohol Use: No    Review of Systems  Constitutional: Negative for fever.  Respiratory: Negative for shortness of breath.   Cardiovascular: Positive for leg swelling. Negative for chest pain.  Gastrointestinal: Negative for nausea and vomiting.      Allergies  Review of patient's allergies indicates no known allergies.  Home Medications   Prior to Admission medications   Medication Sig Start Date End Date Taking? Authorizing  Provider  hydrochlorothiazide (HYDRODIURIL) 25 MG tablet Take 1 tablet (25 mg total) by mouth daily. 11/15/14   Dahlia Bailiff, PA-C   Triage Vitals: BP 103/79 mmHg  Pulse 66  Temp(Src) 97.9 F (36.6 C) (Oral)  Resp 16  SpO2 97% Physical Exam  Constitutional: He is oriented to person, place, and time. He appears well-developed and well-nourished. No distress.  HENT:  Head: Normocephalic and atraumatic.  Eyes: Right eye exhibits no discharge. Left eye exhibits no discharge. No scleral icterus.  Neck: Normal range of motion. Neck supple.  Pulmonary/Chest: Effort normal. No respiratory distress.  Musculoskeletal: Normal range of motion.  Lymphadenopathy:  Patient has chronic appearing lichenification and thickening of skin of right lower extremity with associated nonpitting edema. This edema and thickening of scan is diffuse throughout patient's lower right extremity. There is no erythema, warmth, open wounds, draining purulence or sinus tracts noted.  Neurological: He is alert and oriented to person, place, and time.  Skin: Skin is warm and dry. He is not diaphoretic.  Psychiatric: He has a normal mood and affect.  Nursing note and vitals reviewed.   ED Course  Procedures (including critical care time) DIAGNOSTIC STUDIES: Oxygen Saturation is 97% on room air, normal by my interpretation.    COORDINATION  OF CARE:    Labs Review Labs Reviewed  CBC WITH DIFFERENTIAL/PLATELET - Abnormal; Notable for the following:    WBC 3.9 (*)    RBC 3.34 (*)    Hemoglobin 10.6 (*)    HCT 31.7 (*)    Neutrophils Relative % 36 (*)    Neutro Abs 1.4 (*)    Lymphocytes Relative 49 (*)    All other components within normal limits  COMPREHENSIVE METABOLIC PANEL - Abnormal; Notable for the following:    Potassium 3.2 (*)    Glucose, Bld 106 (*)    Creatinine, Ser 1.33 (*)    Calcium 8.4 (*)    Total Protein 5.4 (*)    Albumin 2.6 (*)    All other components within normal limits    Imaging  Review No results found.   EKG Interpretation None      MDM   Final diagnoses:  Lymphedema of right lower extremity    Patient here with chronic lymphedema, he is well known to the emergency department, is noted to have frequent visits for "flareups" of his lymphedema. On examination there is no evidence of acute swelling of his lower extremity. Extremity swelling appears chronic in nature, and based on previous examinations appears to be at baseline for him. There is no concern for cellulitis or infection. No concern for DVT based on chronicity of his condition. Patient requesting refill on his diuretic. I doubt the diuretic is going to improve his signs or symptoms, we will refill several days worth and strongly encouraged him to follow-up at the wellness clinic for follow-up of his chronic lymphedema. Patient hemodynamically stable and in no acute distress. Patient stable for discharge.  I personally performed the services described in this documentation, which was scribed in my presence. The recorded information has been reviewed and is accurate.  BP 119/69 mmHg  Pulse 89  Temp(Src) 98.2 F (36.8 C) (Oral)  Resp 18  SpO2 100%  Signed,  Dahlia Bailiff, PA-C 2:37 AM  Patient seen and discussed with Dr. Rolland Porter, M.D.  Dahlia Bailiff, PA-C 11/15/14 [place], MD 11/15/14 [phone]

## 2014-11-14 NOTE — ED Notes (Signed)
Pt c/o increased pain in right leg and foot over past few days

## 2014-11-15 LAB — COMPREHENSIVE METABOLIC PANEL
ALBUMIN: 2.6 g/dL — AB (ref 3.5–5.0)
ALT: 20 U/L (ref 17–63)
AST: 27 U/L (ref 15–41)
Alkaline Phosphatase: 62 U/L (ref 38–126)
Anion gap: 7 (ref 5–15)
BILIRUBIN TOTAL: 0.3 mg/dL (ref 0.3–1.2)
BUN: 16 mg/dL (ref 6–20)
CHLORIDE: 108 mmol/L (ref 101–111)
CO2: 24 mmol/L (ref 22–32)
CREATININE: 1.33 mg/dL — AB (ref 0.61–1.24)
Calcium: 8.4 mg/dL — ABNORMAL LOW (ref 8.9–10.3)
GFR calc Af Amer: >60 mL/min (ref >60)
GFR calc non Af Amer: >60 mL/min (ref >60)
Glucose, Bld: 106 mg/dL — ABNORMAL HIGH (ref 65–99)
Potassium: 3.2 mmol/L — ABNORMAL LOW (ref 3.5–5.1)
SODIUM: 139 mmol/L (ref 135–145)
Total Protein: 5.4 g/dL — ABNORMAL LOW (ref 6.5–8.1)

## 2014-11-15 LAB — CBC WITH DIFFERENTIAL/PLATELET
BASOS PCT: 0 % (ref 0–1)
Basophils Absolute: 0 10*3/uL (ref 0.0–0.1)
EOS ABS: 0.2 10*3/uL (ref 0.0–0.7)
Eosinophils Relative: 5 % (ref 0–5)
HEMATOCRIT: 31.7 % — AB (ref 39.0–52.0)
Hemoglobin: 10.6 g/dL — ABNORMAL LOW (ref 13.0–17.0)
Lymphocytes Relative: 49 % — ABNORMAL HIGH (ref 12–46)
Lymphs Abs: 1.9 10*3/uL (ref 0.7–4.0)
MCH: 31.7 pg (ref 26.0–34.0)
MCHC: 33.4 g/dL (ref 30.0–36.0)
MCV: 94.9 fL (ref 78.0–100.0)
MONO ABS: 0.4 10*3/uL (ref 0.1–1.0)
Monocytes Relative: 10 % (ref 3–12)
NEUTROS ABS: 1.4 10*3/uL — AB (ref 1.7–7.7)
Neutrophils Relative %: 36 % — ABNORMAL LOW (ref 43–77)
Platelets: 185 10*3/uL (ref 150–400)
RBC: 3.34 MIL/uL — ABNORMAL LOW (ref 4.22–5.81)
RDW: 13.1 % (ref 11.5–15.5)
WBC: 3.9 10*3/uL — ABNORMAL LOW (ref 4.0–10.5)

## 2014-11-15 MED ORDER — HYDROCHLOROTHIAZIDE 25 MG PO TABS: 25.0000 mg | ORAL_TABLET | Freq: Every day | ORAL | Status: DC

## 2014-11-15 NOTE — Discharge Instructions (Signed)
Lymphedema Lymphedema is a swelling caused by the abnormal collection of lymph under the skin. The lymph is fluid from the tissues in your body that travels in the lymphatic system. This system is part of the immune system that includes lymph nodes and vessels. The lymph vessels collect and carry the excess fluid, fats, proteins, and wastes from the tissues of the body to the bloodstream. This system also works to clean and remove bacteria and waste products from the body.  Lymphedema occurs when the lymphatic system is blocked. When the lymph vessels or lymph nodes are blocked or damaged, lymph does not drain properly. This causes abnormal build up of lymph. This leads to swelling in the arms or legs. Lymphedema cannot be cured by medicines. But the swelling can be reduced by physical methods. CAUSES  There are two types of lymphedema. Primary lymphedema is caused by the absence or abnormality of the lymph vessel at birth. It is also known as inherited lymphedema, which occurs rarely. Secondary or acquired lymphedema occurs when the lymph vessel is damaged or blocked. The causes of lymph vessel blockage are:   Skin infection like cellulites.  Infection by parasites (filariasis).  Injury.  Cancer.  Radiation therapy.  Formation of scar tissue.  Surgery. SYMPTOMS  The symptoms of lymphedema are:  Abnormal swelling of the arm or leg.  Heavy or tight feeling in your arm or leg.  Tight-fitting shoes or rings.  Redness of skin over the affected area.  Limited movement of the affected limb.  Some patients complain about sensitivity to touch and discomfort in the limb(s) affected. You may not have these symptoms immediately following injury. They usually appear within a few days or even years after injury. Inform your caregiver, if you have any of these symptoms. Early treatment can avoid further problems.  DIAGNOSIS  First, your caregiver will inquire about any surgery you have had or  medicines you are taking. He will then examine you. Your caregiver may order special imaging tests, such as:  Lymphoscintigraphy (a test in which a low dose of radioactive substance is injected to trace the flow of lymph through the lymph vessels).  MRI (imaging tests using magnetic fields).  Computed tomography (test using special cross-sectional X-rays).  Duplex ultrasound (test using high-frequency sound waves to show the vessels and the blood flow on a screen).  Lymphangiography (special X-ray taken after injecting a contrast dye into the lymph vessel). It is now rarely done. TREATMENT  Lymphedema can be treated in different ways. Your caregiver will decide the type of treatment depending on the cause. Treatment may include:  Exercise: Special exercises will help fluid move out easily from the affected part. This should be done as per your caregiver's advice.  Manual lymph drainage: Gentle massage of the affected limb makes the fluid to move out more freely.  Compression: Compression stockings or external pump apply pressure over the affected limb. This helps the fluid to move out from the arm or leg. Bandaging can also help to move the fluid out from the affected part. Your caregiver will decide the method that suits you the best.  Medicines: Your caregiver may prescribe antibiotics, if you have infection.  Surgery: Your caregiver may advise surgery for severe lymphedema. It is reserved for special cases when the patient has difficulty moving. Your surgeon may remove excess tissue from the arm or leg. This will help to ease your movement. Physical therapy may have to be continued after surgery. HOME CARE INSTRUCTIONS  The area is very fragile and is predisposed to injury and infection.  Eat a healthy diet.  Exercise regularly as per advice.  Keep the affected area clean and dry.  Use gloves while cooking or gardening.  Protect your skin from cuts.  Use electric razor to  shave the affected area.  Keep affected limb elevated.  Do not wear tight clothes, shoes, or jewelry as it may cause the tissue to be strangled.  Do not use heat pads over the affected area.  Do not sit with cross legs.  Do not walk barefoot.  Do not carry weight on the affected arm.  Avoid having blood pressure checked on the affected limb. SEEK MEDICAL CARE IF:  You continue to have swelling in your limb. SEEK IMMEDIATE MEDICAL CARE IF:   You have high fever.  You have skin rash.  You have chills or sweats.  You have pain or redness.  You have a cut that does not heal. MAKE SURE YOU:   Understand these instructions.  Will watch your condition.  Will get help right away if you are not doing well or get worse. Document Released: 04/13/2007 Document Revised: 06/02/2012 Document Reviewed: 03/19/2009 [facility] Patient Information 2015 [place], [place]. This information is not intended to replace advice given to you by your health care provider. Make sure you discuss any questions you have with your health care provider.   Emergency Department Resource Guide 1) Find a Doctor and Pay Out of Pocket Although you won't have to find out who is covered by your insurance plan, it is a good idea to ask around and get recommendations. You will then need to call the office and see if the doctor you have chosen will accept you as a new patient and what types of options they offer for patients who are self-pay. Some doctors offer discounts or will set up payment plans for their patients who do not have insurance, but you will need to ask so you aren't surprised when you get to your appointment.  2) Contact Your Local Health Department Not all health departments have doctors that can see patients for sick visits, but many do, so it is worth a call to see if yours does. If you don't know where your local health department is, you can check in your phone book. The CDC also has a tool to help  you locate your state's health department, and many state websites also have listings of all of their local health departments.  3) Find a [facility] If your illness is not likely to be very severe or complicated, you may want to try a walk in clinic. These are popping up all over the country in pharmacies, drugstores, and shopping centers. They're usually staffed by nurse practitioners or physician assistants that have been trained to treat common illnesses and complaints. They're usually fairly quick and inexpensive. However, if you have serious medical issues or chronic medical problems, these are probably not your best option.  No Primary Care Doctor: - Call Health Connect at  [phone] - they can help you locate a primary care doctor that  accepts your insurance, provides certain services, etc. - Physician Referral Service- [phone]  Chronic Pain Problems: Organization         Address  Phone   Notes  [facility]  [phone] Patients need to be referred by their primary care doctor.   Medication Assistance: Organization  Address  Phone   Notes  [facility] Medication [facility] [address] [place], [place] [address] [phone] --Must be a resident of [facility] -- Must have NO insurance coverage whatsoever (no Medicaid/ Medicare, etc.) -- The pt. MUST have a primary care doctor that directs their care regularly and follows them in the community   MedAssist  [phone]   [employer]  [phone]    Agencies that provide inexpensive medical care: Organization         Address  Phone   Notes  [place]  [phone]   [facility]    [phone]   [facility] [place], [place] [address] [phone]   [place] [address], [place] [phone]   Planned Parenthood    [phone]   [facility]    [phone]   [place] and [place] [address], [place] Phone:  [phone], Fax:  [phone] Hours of Operation:  9 am - 6 pm, M-F.  Also accepts Medicaid/Medicare and self-pay.  [facility] for [place] [address], [address], [place] Phone: [phone], Fax: [phone]. Hours of Operation:  8:30 am - 5:30 pm, M-F.  Also accepts Medicaid and self-pay.  [facility] [place] [address], [place] Phone: [phone]   [place], [place], [place] [phone], Ext. [phone] Mondays & Thursdays: 7-9 AM.  First 15 patients are seen on a first come, first serve basis.    [place] Providers:  Organization         Address  Phone   Notes  [facility] [address], [place] [phone] Also accepts self-pay patients.  [facility] [address], [place]  [phone]   [place], [address], [place] [phone]   [facility] [address] [place] [phone]   Lucianne Lei [address], [place]   [phone] Only accepts [place] Access [place] patients after they have their name applied to their card.   Self-Pay (no insurance) in [facility]:  Organization         Address  Phone   Notes  Sickle Cell Patients, [facility] [place] [phone]   [facility] [place] [phone]   [facility] [place]  [place], [place], [place] [phone]   [facility]/Dr. Osei-Bonsu  [address], [place] or [address], [place] [phone] Phone number for both [place] and [place] locations is the same.  Urgent Medical and [facility] [address]  [place] [phone]   [facility] [address] [place] or [address] [phone]   [facility] [address], [place] [phone], phone; [phone], fax Sees patients 1st and 3rd Saturday of every month.  Must not qualify for public or private insurance (i.e. Medicaid, Medicare, [place] Health Choice, Veterans' Benefits)  Household income should be no more than 200% of the poverty level The clinic cannot treat you if you are pregnant or think you  are pregnant  Sexually transmitted diseases are not treated at the clinic.    Dental Care: Organization         Address  Phone  Notes  [facility] Department of [facility] [place] [phone] Accepts children up to age 85 who are enrolled in [place] or [place]; pregnant women with a Medicaid card; and children who have applied for Medicaid or [place] Health Choice, but were declined, whose parents can pay a reduced fee at time of service.  [facility] Department of [facility]  [address], [place] [phone] Accepts children up to age 31 who are enrolled in [place] or [place]; pregnant women with a Medicaid card; and children who have applied for Medicaid or [place] Health Choice, but were declined, whose parents can pay a reduced fee at time of service.  [place] Adult Dental Access PROGRAM  [place] [phone] Patients are seen by appointment only. Walk-ins are not accepted. [place] will see patients 57 years of age and older. Monday - Tuesday (8am-5pm) Most Wednesdays (8:30-5pm) $30 per visit, cash only  [facility] Adult Dental Access PROGRAM  [address], [facility] [phone] Patients are seen by appointment only. Walk-ins are not accepted. [place] will see patients 70 years of age and older. One Wednesday Evening  (Monthly: Volunteer Based).  $30 per visit, cash only  [place]  [phone] for adults; Children under age 25, call Graduate Pediatric Dentistry at [phone]. Children aged 78-14, please call [phone] to request a pediatric application.  Dental services are provided in all areas of dental care including fillings, crowns and bridges, complete and partial dentures, implants, gum treatment, root canals, and extractions. Preventive care is also provided. Treatment is provided to both adults and children. Patients are selected via a lottery and there is often a waiting list.   [facility] [address], [place]  [phone] [email or web address]   [facility] [address] [place], [place] [phone], Ext. [phone] Second and Fourth Thursday of each month, opens at 6:30 AM; Clinic ends at 9 AM.  Patients are seen on a first-come first-served basis, and a limited number are seen during each clinic.   [facility]  [address] Hillard Danker [place], [place] [phone]   Eligibility Requirements You must have lived in [place], [place], or [place] counties for at least the last three months.   You cannot be eligible for state or federal sponsored [employer], including [employer], [place], or [employer].   You generally cannot be eligible for healthcare insurance through your employer.    How to apply: Eligibility screenings are held every Tuesday and Wednesday afternoon from 1:00 pm until 4:00 pm. You do not need an appointment for the interview!  [facility] [address], [place], [place]   [place]  [place] Department  [place]  [phone]    Behavioral Health Resources in the Community: Intensive Outpatient Programs Organization         Address  Phone  Notes  [place]. [address] [place], [place] [phone]   [facility] Outpatient [address], [place], [place]   ADS: Alcohol & Drug Svcs [address], [place], [place]   [place] [address]  [place], [place] or [phone]   Substance Abuse Resources Organization         Address  Phone  Notes  Alcohol and Drug Services  [phone]   [place]  [phone]   [facility] [place]   Chinita Pester  [phone]   Residential & Outpatient Substance Abuse Program  [phone]   Psychological Services Organization         Address  Phone  Notes  [facility]  [place]  [phone]   [place] [address] [place] or [phone]    Mobile Crisis Teams Organization         Address  Phone  Notes  [facility], Mobile Crisis Care Unit  [phone]   [facility]  [address] [place], [place]   [place] Levels [address] [place] [phone]    Self-Help/Support Groups Organization         Address  Phone             Notes  [place]. of [place] - variety of support groups  [place] Call for more information  Narcotics Anonymous (NA), Caring Services [address], [employer] [place]  2 meetings at this location   Special educational needs teacher         Address  Phone  Notes  ASAP Residential Treatment [place],    [place]  [phone]   [facility]  [address], [place] [address], [place], [place]   [place], [place] [phone] Admissions: 8am-3pm M-F  Incentives Substance [place] [address],    [place], [place] [phone]   [facility] [address] [place],  [place], [place]   [facility] [address],  [place], [place]   [facility] Programs - Intensive Outpatient [address] Kristeen [address], [place], [place]   [facility] ([place].,  [place], [place] [phone] or [phone]   [facility]idential Treatment Services (RTS) [address], [place], [place] Accepts Medicaid  [facility] [place] [address]  [place] [phone] Substance Abuse/Addiction Treatment   [facility] Organization         Address  Phone  Notes  [facility]  [phone]   Domenic Schwab, PhD [address] Arlis Porta [place], [place]   [phone] or [phone]   [place], [place] [phone]   [facility] [address] [place], [place] [phone] Insurance/Medicaid/sponsorship through [facility] and Families [address]                                    [place], [place] [phone] [place] [address], [place] [phone]    Dr. Adele Schilder  [phone]   [facility] Dept. 1) [address], [place] 2) [place] 3)  [address], [place] [phone]  [phone]   [place] [phone])  [phone] or [phone] (After Hours)    ° ° ° °

## 2014-11-15 NOTE — ED Provider Notes (Signed)
Patient has a history of schizophrenia and is homeless. He has has chronic lymphedema of his right lower extremity for several years. Patient does not have a PCP. He presents tonight stating his swelling is worse. Of note it is raining tonight.  Patient is sleeping in no distress in his room. When I examine his right leg he has diffuse swelling of his right lower extremity with thickening of the skin. There is no increased warmth or redness to the skin. There is no open or draining wounds.  Patient's leg appears to be chronic in nature without an acute component tonight.  Medical screening examination/treatment/procedure(s) were conducted as a shared visit with non-physician practitioner(s) and myself.  I personally evaluated the patient during the encounter.   EKG Interpretation None       Rolland Porter, MD, Barbette Or, MD 11/15/14 [phone]

## 2014-11-16 ENCOUNTER — Encounter (HOSPITAL_COMMUNITY): Payer: Self-pay | Admitting: Adult Health

## 2014-11-16 ENCOUNTER — Emergency Department (HOSPITAL_COMMUNITY)
Admission: EM | Admit: 2014-11-16 | Discharge: 2014-11-16 | Disposition: A | Discharge status: Home / Self Care | Payer: Self-pay | Attending: Emergency Medicine | Admitting: Emergency Medicine

## 2014-11-16 DIAGNOSIS — R600 Localized edema: Secondary | ICD-10-CM

## 2014-11-16 DIAGNOSIS — Z590 Homelessness: Secondary | ICD-10-CM | POA: Insufficient documentation

## 2014-11-16 DIAGNOSIS — Z8679 Personal history of other diseases of the circulatory system: Secondary | ICD-10-CM | POA: Insufficient documentation

## 2014-11-16 DIAGNOSIS — Z8659 Personal history of other mental and behavioral disorders: Secondary | ICD-10-CM | POA: Insufficient documentation

## 2014-11-16 DIAGNOSIS — M62838 Other muscle spasm: Secondary | ICD-10-CM | POA: Insufficient documentation

## 2014-11-16 DIAGNOSIS — M545 Low back pain: Secondary | ICD-10-CM | POA: Insufficient documentation

## 2014-11-16 DIAGNOSIS — M7989 Other specified soft tissue disorders: Secondary | ICD-10-CM | POA: Insufficient documentation

## 2014-11-16 DIAGNOSIS — Z862 Personal history of diseases of the blood and blood-forming organs and certain disorders involving the immune mechanism: Secondary | ICD-10-CM | POA: Insufficient documentation

## 2014-11-16 DIAGNOSIS — Z872 Personal history of diseases of the skin and subcutaneous tissue: Secondary | ICD-10-CM | POA: Insufficient documentation

## 2014-11-16 MED ORDER — METHOCARBAMOL 500 MG PO TABS: 500.0000 mg | ORAL_TABLET | Freq: Four times a day (QID) | ORAL | Status: DC

## 2014-11-16 MED ORDER — METHOCARBAMOL 500 MG PO TABS
500.0000 mg | ORAL_TABLET | Freq: Once | ORAL | Status: AC
  Administered 2014-11-16: 500 mg via ORAL
  Filled 2014-11-16: qty 1

## 2014-11-16 NOTE — ED Provider Notes (Signed)
CSN: [id number]     Arrival date & time 11/16/14  2216 History  This chart was scribed for non-physician practitioner, Carlisle Cater, PA-C working with Blanchie Dessert, MD by Tula Nakayama, ED scribe. This patient was seen in room TR10C/TR10C and the patient's care was started at 10:49 PM  Chief Complaint  Patient presents with  . Spasms  . Leg Swelling   The history is provided by the patient. No language interpreter was used.    HPI Comments: Jonathan Meadows is a 44 y.o. male with a history of schizophrenia, homelessness and chronic lymphedema of his right leg who presents to the Emergency Department complaining of continued right leg swelling that started earlier today. Pt was last seen in the ED 2 days ago for the same. He was prescribed HCTZ, but has not fulfilled his prescription because of financial difficulty. Pt has a history of similar symptoms consistent with lymphedema. He denies any new pain or swelling.  Pt also complains of intermittent, moderate muscle tightness in his back that started last night. He reports similar, intermittent spasms in his bilateral arms. Pt has not tried any treatment PTA. He denies recent falls or injuries.  Past Medical History  Diagnosis Date  . Anemia   . Paranoid schizophrenia   . Cellulitis   . Homelessness   . Lymphedema of leg    History reviewed. No pertinent past surgical history. Family History  Problem Relation Age of Onset  . CAD      Neg HX  . Hypertension      Neg Hx  . Diabetes      Neg Hx  . Cancer      Neg Hx   History  Substance Use Topics  . Smoking status: Never Smoker   . Smokeless tobacco: Never Used  . Alcohol Use: No    Review of Systems  Constitutional: Negative for fever.  HENT: Negative for rhinorrhea and sore throat.   Eyes: Negative for redness.  Respiratory: Negative for cough.   Cardiovascular: Positive for leg swelling (chronic). Negative for chest pain.  Gastrointestinal: Negative for nausea,  vomiting, abdominal pain and diarrhea.  Genitourinary: Negative for dysuria.  Musculoskeletal: Positive for myalgias and arthralgias.  Skin: Negative for rash.  Neurological: Negative for headaches.      Allergies  Review of patient's allergies indicates no known allergies.  Home Medications   Prior to Admission medications   Medication Sig Start Date End Date Taking? Authorizing Provider  hydrochlorothiazide (HYDRODIURIL) 25 MG tablet Take 1 tablet (25 mg total) by mouth daily. 11/15/14   Dahlia Bailiff, PA-C  methocarbamol (ROBAXIN) 500 MG tablet Take 1 tablet (500 mg total) by mouth 4 (four) times daily. 11/16/14   Carlisle Cater, PA-C   BP 136/83 mmHg  Pulse 76  Temp(Src) 98.3 F (36.8 C) (Oral)  Resp 18  SpO2 98%   Physical Exam  Constitutional: He appears well-developed and well-nourished. No distress.  HENT:  Head: Normocephalic and atraumatic.  Eyes: Conjunctivae and EOM are normal. Right eye exhibits no discharge. Left eye exhibits no discharge.  Neck: Normal range of motion. Neck supple. No tracheal deviation present.  Cardiovascular: Normal rate, regular rhythm and normal heart sounds.   Pulmonary/Chest: Effort normal and breath sounds normal. No respiratory distress.  Abdominal: Soft. There is no tenderness.  Musculoskeletal: He exhibits edema and tenderness.       Right shoulder: Normal.       Left shoulder: Normal.       Right  elbow: Normal.      Left elbow: Normal.       Right wrist: Normal.       Left wrist: Normal.       Cervical back: He exhibits normal range of motion, no tenderness and no bony tenderness.       Thoracic back: He exhibits normal range of motion, no tenderness and no bony tenderness.       Lumbar back: He exhibits tenderness. He exhibits normal range of motion and no bony tenderness.       Right upper arm: Normal.       Left upper arm: Normal.       Right forearm: Normal.       Left forearm: Normal.  Patient with chronic R lower extremity  lymphedema. Thigh and calf are firm but without redness. There is no pallor. There is no erythema or apparent cellulitis. Appears to be chronic in nature.  Neurological: He is alert.  Skin: Skin is warm and dry.  Psychiatric: He has a normal mood and affect. His behavior is normal.  Nursing note and vitals reviewed.   ED Course  Procedures   DIAGNOSTIC STUDIES: Oxygen Saturation is 98% on RA, normal by my interpretation.    COORDINATION OF CARE: 10:53 PM Discussed treatment plan with pt which includes follow-[facility] with [facility] and a prescription for muscle relaxers. Pt agreed to plan.   Labs Review Labs Reviewed - No data to display  Imaging Review No results found.   EKG Interpretation None        MDM   Final diagnoses:  Edema of right lower extremity  Muscle spasm   Patient with chronic right lower extremity lymphedema. No indication of acute worsening tonight. Patient does not have a history of DVT. Patient was seen 2 days ago for the same. He was prescribed HCTZ which he states helps him but has not filled this. He has not followed up with the health and wellness clinic. Patient strongly encouraged to follow-up.  Muscle spasms: Currently asymptomatic. Patient given Robaxin and ED and prescription for home.  I personally performed the services described in this documentation, which was scribed in my presence. The recorded information has been reviewed and is accurate.    Carlisle Cater, PA-C 11/16/14 2314  Blanchie Dessert, MD 11/16/14 [phone]

## 2014-11-16 NOTE — Discharge Instructions (Signed)
Please read and follow all provided instructions.  Your diagnoses today include:  1. Edema of right lower extremity   2. Muscle spasm    Tests performed today include:  Vital signs. See below for your results today.   Medications prescribed:   Robaxin (methocarbamol) - muscle relaxer medication  DO NOT drive or perform any activities that require you to be awake and alert because this medicine can make you drowsy.   Take any prescribed medications only as directed.  Home care instructions:  Follow any educational materials contained in this packet.  BE VERY CAREFUL not to take multiple medicines containing Tylenol (also called acetaminophen). Doing so can lead to an overdose which can damage your liver and cause liver failure and possibly death.   Follow-up instructions: Please follow-up with your primary care provider in the next 3 days for further evaluation of your symptoms. Go to [facility] for help with your chronic health problems.   Return instructions:   Please return to the Emergency Department if you experience worsening symptoms.   Please return if you have any other emergent concerns.  Additional Information:  Your vital signs today were: BP 136/83 mmHg   Pulse 76   Temp(Src) 98.3 F (36.8 C) (Oral)   Resp 18   SpO2 98% If your blood pressure (BP) was elevated above 135/85 this visit, please have this repeated by your doctor within one month. --------------

## 2014-11-16 NOTE — ED Notes (Signed)
PA at the bedside.

## 2014-11-16 NOTE — ED Notes (Signed)
Presents with right leg lymphedema that has been ongoing-he was seen 2 days ago-today c/i arm and back muscle spasms.

## 2014-12-03 DIAGNOSIS — F1910 Other psychoactive substance abuse, uncomplicated: Secondary | ICD-10-CM | POA: Insufficient documentation

## 2014-12-03 DIAGNOSIS — F250 Schizoaffective disorder, bipolar type: Secondary | ICD-10-CM | POA: Insufficient documentation

## 2014-12-03 DIAGNOSIS — I10 Essential (primary) hypertension: Secondary | ICD-10-CM | POA: Insufficient documentation

## 2015-01-15 DIAGNOSIS — E876 Hypokalemia: Secondary | ICD-10-CM | POA: Insufficient documentation

## 2015-01-22 ENCOUNTER — Encounter (HOSPITAL_COMMUNITY): Payer: Self-pay | Admitting: Emergency Medicine

## 2015-01-22 ENCOUNTER — Emergency Department (HOSPITAL_COMMUNITY)
Admission: EM | Admit: 2015-01-22 | Discharge: 2015-01-23 | Disposition: A | Discharge status: Home / Self Care | Payer: Self-pay | Attending: Emergency Medicine | Admitting: Emergency Medicine

## 2015-01-22 DIAGNOSIS — Z792 Long term (current) use of antibiotics: Secondary | ICD-10-CM | POA: Insufficient documentation

## 2015-01-22 DIAGNOSIS — Z8659 Personal history of other mental and behavioral disorders: Secondary | ICD-10-CM | POA: Insufficient documentation

## 2015-01-22 DIAGNOSIS — Z872 Personal history of diseases of the skin and subcutaneous tissue: Secondary | ICD-10-CM | POA: Insufficient documentation

## 2015-01-22 DIAGNOSIS — I890 Lymphedema, not elsewhere classified: Secondary | ICD-10-CM | POA: Insufficient documentation

## 2015-01-22 DIAGNOSIS — Z79899 Other long term (current) drug therapy: Secondary | ICD-10-CM | POA: Insufficient documentation

## 2015-01-22 DIAGNOSIS — Z862 Personal history of diseases of the blood and blood-forming organs and certain disorders involving the immune mechanism: Secondary | ICD-10-CM | POA: Insufficient documentation

## 2015-01-22 DIAGNOSIS — Z590 Homelessness: Secondary | ICD-10-CM | POA: Insufficient documentation

## 2015-01-22 NOTE — ED Notes (Signed)
Pt states he has pain and swelling in his right leg  Denies injury

## 2015-01-22 NOTE — ED Provider Notes (Signed)
CSN: [id number]     Arrival date & time 01/22/15  2245 History   This chart was scribed for Julianne Rice, MD by Forrestine Him, ED Scribe. This patient was seen in room WA07/WA07 and the patient's care was started 11:57 PM.   Chief Complaint  Patient presents with  . Leg Pain   The history is provided by the patient. No language interpreter was used.    HPI Comments: Jonathan Meadows is a 44 y.o. male with a PMHx of lymphedema of the right leg who presents to the Emergency Department complaining of constant, ongoing R lower extremity swelling and pain this is chronic in nature; worsened in last 2-3 days. No recent long distance travel. No OTC medications attempted at home for discomfort. However, pt is taking prescribed HCTZ as prescribed and directed. No recent missed doses. No recent fever, chills, nausea, vomiting, shortness of breath, or chest pain. No previous history of blood clots. Jonathan Meadows is not currently followed by a PCP. No known allergies to medications.  Past Medical History  Diagnosis Date  . Anemia   . Paranoid schizophrenia   . Cellulitis   . Homelessness   . Lymphedema of leg    History reviewed. No pertinent past surgical history. Family History  Problem Relation Age of Onset  . CAD      Neg HX  . Hypertension      Neg Hx  . Diabetes      Neg Hx  . Cancer      Neg Hx   History  Substance Use Topics  . Smoking status: Never Smoker   . Smokeless tobacco: Never Used  . Alcohol Use: No    Review of Systems  Constitutional: Negative for fever and chills.  Respiratory: Negative for cough and shortness of breath.   Cardiovascular: Positive for leg swelling. Negative for chest pain.  Gastrointestinal: Negative for vomiting, abdominal pain and diarrhea.  Musculoskeletal: Positive for arthralgias.  Neurological: Negative for dizziness, weakness and numbness.  Psychiatric/Behavioral: Negative for confusion.  All other systems reviewed and are  negative.     Allergies  Review of patient's allergies indicates no known allergies.  Home Medications   Prior to Admission medications   Medication Sig Start Date End Date Taking? Authorizing Provider  clindamycin (CLEOCIN) 300 MG capsule Take 300 mg by mouth every 6 (six) hours. 01/17/15 01/27/15 Yes Historical Provider, MD  furosemide (LASIX) 20 MG tablet Take 20 mg by mouth daily. 01/17/15 02/16/15 Yes Historical Provider, MD  hydrochlorothiazide (HYDRODIURIL) 25 MG tablet Take 1 tablet (25 mg total) by mouth daily. 11/15/14  Yes Dahlia Bailiff, PA-C  ibuprofen (ADVIL,MOTRIN) 600 MG tablet Take 1 tablet (600 mg total) by mouth every 6 (six) hours as needed for moderate pain. Pain for 5 days 01/23/15 01/28/15  Julianne Rice, MD  methocarbamol (ROBAXIN) 500 MG tablet Take 1 tablet (500 mg total) by mouth 4 (four) times daily. Patient not taking: Reported on 01/22/2015 11/16/14   Carlisle Cater, PA-C  traMADol (ULTRAM) 50 MG tablet Take 50 mg by mouth every 6 (six) hours as needed. pain 01/17/15   Historical Provider, MD   Triage Vitals: BP 114/75 mmHg  Pulse 92  Temp(Src) 98.4 F (36.9 C) (Oral)  Resp 16  SpO2 100%   Physical Exam  Constitutional: He is oriented to person, place, and time. He appears well-developed and well-nourished. No distress.  HENT:  Head: Normocephalic and atraumatic.  Mouth/Throat: Oropharynx is clear and moist.  Eyes: EOM are  normal. Pupils are equal, round, and reactive to light.  Neck: Normal range of motion. Neck supple.  Cardiovascular: Normal rate and regular rhythm.   Pulmonary/Chest: Effort normal and breath sounds normal. No respiratory distress. He has no wheezes. He has no rales.  Abdominal: Soft. Bowel sounds are normal. He exhibits no distension and no mass. There is no tenderness. There is no rebound and no guarding.  Musculoskeletal: Normal range of motion. He exhibits edema. He exhibits no tenderness.  Right lower extremity lymphedema. Distal pulses  intact. No warmth or erythema.  Neurological: He is alert and oriented to person, place, and time.  5/5 motor in all extremities. Ambulatory without difficulty.  Skin: Skin is warm and dry. No rash noted. No erythema.  Psychiatric: He has a normal mood and affect. His behavior is normal.  Nursing note and vitals reviewed.   ED Course  Procedures (including critical care time)  DIAGNOSTIC STUDIES: Oxygen Saturation is 100% on RA, Normal by my interpretation.    COORDINATION OF CARE: 12:01 AM- Will give Ibuprofen. Discussed treatment plan with pt at bedside and pt agreed to plan.     Labs Review Labs Reviewed - No data to display  Imaging Review No results found.   EKG Interpretation None      MDM   Final diagnoses:  Lymphedema of right lower extremity    Patient with history of chronic lymphedema to the right lower extremity. States this is worse the last few days but also admits he's been walking more than normal. He tried no home medication for this. He states he has not followed up with a primary physician. Police had multiple negative Dopplers to rule out DVT, impossible to rule out DVT at this point based on exam. We'll schedule for Doppler in the morning. Patient is given ibuprofen in the emergency department. He is advised to keep leg elevated and wear compression stockings. Return precautions given.  I personally performed the services described in this documentation, which was scribed in my presence. The recorded information has been reviewed and is accurate.    Julianne Rice, MD 01/23/15 0010

## 2015-01-23 ENCOUNTER — Emergency Department (HOSPITAL_COMMUNITY)
Admission: EM | Admit: 2015-01-23 | Discharge: 2015-01-23 | Disposition: A | Discharge status: Home / Self Care | Payer: Self-pay

## 2015-01-23 MED ORDER — IBUPROFEN 200 MG PO TABS
600.0000 mg | ORAL_TABLET | Freq: Once | ORAL | Status: AC
  Administered 2015-01-23: 600 mg via ORAL
  Filled 2015-01-23: qty 3

## 2015-01-23 MED ORDER — IBUPROFEN 600 MG PO TABS: 600.0000 mg | ORAL_TABLET | Freq: Four times a day (QID) | ORAL | Status: AC | PRN

## 2015-01-23 NOTE — ED Notes (Signed)
Attempted to call patient x 2 with no response

## 2015-01-23 NOTE — Discharge Instructions (Signed)
Lymphedema Lymphedema is a swelling caused by the abnormal collection of lymph under the skin. The lymph is fluid from the tissues in your body that travels in the lymphatic system. This system is part of the immune system that includes lymph nodes and vessels. The lymph vessels collect and carry the excess fluid, fats, proteins, and wastes from the tissues of the body to the bloodstream. This system also works to clean and remove bacteria and waste products from the body.  Lymphedema occurs when the lymphatic system is blocked. When the lymph vessels or lymph nodes are blocked or damaged, lymph does not drain properly. This causes abnormal build up of lymph. This leads to swelling in the arms or legs. Lymphedema cannot be cured by medicines. But the swelling can be reduced by physical methods. CAUSES  There are two types of lymphedema. Primary lymphedema is caused by the absence or abnormality of the lymph vessel at birth. It is also known as inherited lymphedema, which occurs rarely. Secondary or acquired lymphedema occurs when the lymph vessel is damaged or blocked. The causes of lymph vessel blockage are:   Skin infection like cellulites.  Infection by parasites (filariasis).  Injury.  Cancer.  Radiation therapy.  Formation of scar tissue.  Surgery. SYMPTOMS  The symptoms of lymphedema are:  Abnormal swelling of the arm or leg.  Heavy or tight feeling in your arm or leg.  Tight-fitting shoes or rings.  Redness of skin over the affected area.  Limited movement of the affected limb.  Some patients complain about sensitivity to touch and discomfort in the limb(s) affected. You may not have these symptoms immediately following injury. They usually appear within a few days or even years after injury. Inform your caregiver, if you have any of these symptoms. Early treatment can avoid further problems.  DIAGNOSIS  First, your caregiver will inquire about any surgery you have had or  medicines you are taking. He will then examine you. Your caregiver may order special imaging tests, such as:  Lymphoscintigraphy (a test in which a low dose of radioactive substance is injected to trace the flow of lymph through the lymph vessels).  MRI (imaging tests using magnetic fields).  Computed tomography (test using special cross-sectional X-rays).  Duplex ultrasound (test using high-frequency sound waves to show the vessels and the blood flow on a screen).  Lymphangiography (special X-ray taken after injecting a contrast dye into the lymph vessel). It is now rarely done. TREATMENT  Lymphedema can be treated in different ways. Your caregiver will decide the type of treatment depending on the cause. Treatment may include:  Exercise: Special exercises will help fluid move out easily from the affected part. This should be done as per your caregiver's advice.  Manual lymph drainage: Gentle massage of the affected limb makes the fluid to move out more freely.  Compression: Compression stockings or external pump apply pressure over the affected limb. This helps the fluid to move out from the arm or leg. Bandaging can also help to move the fluid out from the affected part. Your caregiver will decide the method that suits you the best.  Medicines: Your caregiver may prescribe antibiotics, if you have infection.  Surgery: Your caregiver may advise surgery for severe lymphedema. It is reserved for special cases when the patient has difficulty moving. Your surgeon may remove excess tissue from the arm or leg. This will help to ease your movement. Physical therapy may have to be continued after surgery. HOME CARE INSTRUCTIONS    The area is very fragile and is predisposed to injury and infection.  Eat a healthy diet.  Exercise regularly as per advice.  Keep the affected area clean and dry.  Use gloves while cooking or gardening.  Protect your skin from cuts.  Use electric razor to  shave the affected area.  Keep affected limb elevated.  Do not wear tight clothes, shoes, or jewelry as it may cause the tissue to be strangled.  Do not use heat pads over the affected area.  Do not sit with cross legs.  Do not walk barefoot.  Do not carry weight on the affected arm.  Avoid having blood pressure checked on the affected limb. SEEK MEDICAL CARE IF:  You continue to have swelling in your limb. SEEK IMMEDIATE MEDICAL CARE IF:   You have high fever.  You have skin rash.  You have chills or sweats.  You have pain or redness.  You have a cut that does not heal. MAKE SURE YOU:   Understand these instructions.  Will watch your condition.  Will get help right away if you are not doing well or get worse. Document Released: 04/13/2007 Document Revised: 06/02/2012 Document Reviewed: 03/19/2009 [employer] Patient Information 2015 [employer]. This information is not intended to replace advice given to you by your health care provider. Make sure you discuss any questions you have with your health care provider.  

## 2015-01-25 ENCOUNTER — Emergency Department (HOSPITAL_COMMUNITY)
Admission: EM | Admit: 2015-01-25 | Discharge: 2015-01-25 | Disposition: A | Discharge status: Home / Self Care | Payer: Self-pay | Attending: Emergency Medicine | Admitting: Emergency Medicine

## 2015-01-25 ENCOUNTER — Encounter (HOSPITAL_COMMUNITY): Payer: Self-pay | Admitting: Vascular Surgery

## 2015-01-25 DIAGNOSIS — Y929 Unspecified place or not applicable: Secondary | ICD-10-CM | POA: Insufficient documentation

## 2015-01-25 DIAGNOSIS — Z8659 Personal history of other mental and behavioral disorders: Secondary | ICD-10-CM | POA: Insufficient documentation

## 2015-01-25 DIAGNOSIS — Z872 Personal history of diseases of the skin and subcutaneous tissue: Secondary | ICD-10-CM | POA: Insufficient documentation

## 2015-01-25 DIAGNOSIS — Z8679 Personal history of other diseases of the circulatory system: Secondary | ICD-10-CM | POA: Insufficient documentation

## 2015-01-25 DIAGNOSIS — Y939 Activity, unspecified: Secondary | ICD-10-CM | POA: Insufficient documentation

## 2015-01-25 DIAGNOSIS — S1086XA Insect bite of other specified part of neck, initial encounter: Secondary | ICD-10-CM | POA: Insufficient documentation

## 2015-01-25 DIAGNOSIS — Y998 Other external cause status: Secondary | ICD-10-CM | POA: Insufficient documentation

## 2015-01-25 DIAGNOSIS — Z862 Personal history of diseases of the blood and blood-forming organs and certain disorders involving the immune mechanism: Secondary | ICD-10-CM | POA: Insufficient documentation

## 2015-01-25 DIAGNOSIS — Z590 Homelessness: Secondary | ICD-10-CM | POA: Insufficient documentation

## 2015-01-25 DIAGNOSIS — W57XXXA Bitten or stung by nonvenomous insect and other nonvenomous arthropods, initial encounter: Secondary | ICD-10-CM | POA: Insufficient documentation

## 2015-01-25 MED ORDER — DOXYCYCLINE HYCLATE 100 MG PO CAPS: 100.0000 mg | ORAL_CAPSULE | Freq: Two times a day (BID) | ORAL | Status: DC

## 2015-01-25 NOTE — ED Provider Notes (Signed)
CSN: [id number]     Arrival date & time 01/25/15  2214 History   This chart is scribed for non-physician practitioner, Abigail Butts, PA-C, working with Dorie Rank, MD by Chester Holstein, ED Scribe.  This patient was seen in room TR07C/TR07C and the patient's care was started 10:47 PM.      Chief Complaint  Patient presents with  . Tick Removal    The history is provided by the patient and medical records. No language interpreter was used.   HPI Comments: Jonathan Meadows is a 44 y.o. male with PMHx of paranoid schizophrenia and lymphedema who presents to the Emergency Department complaining of tick bite to posterior right neck with onset today. He notes chills. He denies any other tick bites, fever, nausea, vomiting, and rash.  No aggravating or alleviating factors.  No treatment PTA.    Past Medical History  Diagnosis Date  . Anemia   . Paranoid schizophrenia   . Cellulitis   . Homelessness   . Lymphedema of leg    History reviewed. No pertinent past surgical history. Family History  Problem Relation Age of Onset  . CAD      Neg HX  . Hypertension      Neg Hx  . Diabetes      Neg Hx  . Cancer      Neg Hx   History  Substance Use Topics  . Smoking status: Never Smoker   . Smokeless tobacco: Never Used  . Alcohol Use: No    Review of Systems  Constitutional: Positive for chills. Negative for fever.  Respiratory: Negative for cough and shortness of breath.   Cardiovascular: Negative for chest pain.  Gastrointestinal: Negative for nausea and vomiting.  Skin: Negative for rash.       Tick bite  Neurological: Negative for syncope, light-headedness and headaches.      Allergies  Review of patient's allergies indicates no known allergies.  Home Medications   Prior to Admission medications   Medication Sig Start Date End Date Taking? Authorizing Provider  clindamycin (CLEOCIN) 300 MG capsule Take 300 mg by mouth every 6 (six) hours. 01/17/15 01/27/15  Historical  Provider, MD  doxycycline (VIBRAMYCIN) 100 MG capsule Take 1 capsule (100 mg total) by mouth 2 (two) times daily. 01/25/15   Yazaira Speas, PA-C  furosemide (LASIX) 20 MG tablet Take 20 mg by mouth daily. 01/17/15 02/16/15  Historical Provider, MD  hydrochlorothiazide (HYDRODIURIL) 25 MG tablet Take 1 tablet (25 mg total) by mouth daily. 11/15/14   Dahlia Bailiff, PA-C  ibuprofen (ADVIL,MOTRIN) 600 MG tablet Take 1 tablet (600 mg total) by mouth every 6 (six) hours as needed for moderate pain. Pain for 5 days 01/23/15 01/28/15  Julianne Rice, MD  methocarbamol (ROBAXIN) 500 MG tablet Take 1 tablet (500 mg total) by mouth 4 (four) times daily. Patient not taking: Reported on 01/22/2015 11/16/14   Carlisle Cater, PA-C  traMADol (ULTRAM) 50 MG tablet Take 50 mg by mouth every 6 (six) hours as needed. pain 01/17/15   Historical Provider, MD   BP 104/64 mmHg  Pulse 94  Temp(Src) 97.8 F (36.6 C) (Oral)  Resp 16  SpO2 97% Physical Exam  Constitutional: He appears well-developed and well-nourished. No distress.  Awake, alert, nontoxic appearance  HENT:  Head: Normocephalic and atraumatic.  Mouth/Throat: Oropharynx is clear and moist. No oropharyngeal exudate.  Eyes: Conjunctivae are normal. No scleral icterus.  Neck: Normal range of motion. Neck supple.  Cardiovascular: Normal rate, regular rhythm and  intact distal pulses.   Pulmonary/Chest: Effort normal and breath sounds normal. No respiratory distress. He has no wheezes.  Equal chest expansion  Abdominal: Soft. Bowel sounds are normal. He exhibits no mass. There is no tenderness. There is no rebound and no guarding.  Musculoskeletal: Normal range of motion. He exhibits no edema.  Neurological: He is alert.  Speech is clear and goal oriented Moves extremities without ataxia  Skin: Skin is warm and dry. He is not diaphoretic.  Small scabbed area to the left posterior neck without erythema, induration, fluctuance, drainage or increased warmth   Psychiatric: He has a normal mood and affect.  Nursing note and vitals reviewed.   ED Course  Procedures (including critical care time) DIAGNOSTIC STUDIES: Oxygen Saturation is 97% on room air, normal by my interpretation.    COORDINATION OF CARE: 10:50 PM Discussed treatment plan with patient at beside, the patient agrees with the plan and has no further questions at this time.   Labs Review Labs Reviewed - No data to display  Imaging Review No results found.   EKG Interpretation None      MDM   Final diagnoses:  Tick bite   [name] presents with c/o tick bite.  Small scabbed area on the posterior right neck without erythema, induration, fluctuance or increased warmth.  He is very concerned about RMSF and wishes for testing and treatment.  Pros and Cons of testing discussed.  Will treat with doxycycline at pt request.    BP 104/64 mmHg  Pulse 94  Temp(Src) 97.8 F (36.6 C) (Oral)  Resp 16  SpO2 97%  I personally performed the services described in this documentation, which was scribed in my presence. The recorded information has been reviewed and is accurate.   Jarrett Soho Cambrea Kirt, PA-C 01/25/15 3267  Dorie Rank, MD 01/27/15 [phone]

## 2015-01-25 NOTE — Discharge Instructions (Signed)
1. Medications: doxycycline, usual home medications 2. Treatment: rest, drink plenty of fluids,  3. Follow Up: Please followup with your primary doctor in 3 days for discussion of your diagnoses and further evaluation after today's visit; if you do not have a primary care doctor use the resource guide provided to find one; Please return to the ER for worsening symptoms   Tick Bite Information Ticks are insects that attach themselves to the skin and draw blood for food. There are various types of ticks. Common types include wood ticks and deer ticks. Most ticks live in shrubs and grassy areas. Ticks can climb onto your body when you make contact with leaves or grass where the tick is waiting. The most common places on the body for ticks to attach themselves are the scalp, neck, armpits, waist, and groin. Most tick bites are harmless, but sometimes ticks carry germs that cause diseases. These germs can be spread to a person during the tick's feeding process. The chance of a disease spreading through a tick bite depends on:   The type of tick.  Time of year.   How long the tick is attached.   Geographic location.  HOW CAN YOU PREVENT TICK BITES? Take these steps to help prevent tick bites when you are outdoors:  Wear protective clothing. Long sleeves and long pants are best.   Wear white clothes so you can see ticks more easily.  Tuck your pant legs into your socks.   If walking on a trail, stay in the middle of the trail to avoid brushing against bushes.  Avoid walking through areas with long grass.  Put insect repellent on all exposed skin and along boot tops, pant legs, and sleeve cuffs.   Check clothing, hair, and skin repeatedly and before going inside.   Brush off any ticks that are not attached.  Take a shower or bath as soon as possible after being outdoors.  WHAT IS THE PROPER WAY TO REMOVE A TICK? Ticks should be removed as soon as possible to help prevent  diseases caused by tick bites. 1. If latex gloves are available, put them on before trying to remove a tick.  2. Using fine-point tweezers, grasp the tick as close to the skin as possible. You may also use curved forceps or a tick removal tool. Grasp the tick as close to its head as possible. Avoid grasping the tick on its body. 3. Pull gently with steady upward pressure until the tick lets go. Do not twist the tick or jerk it suddenly. This may break off the tick's head or mouth parts. 4. Do not squeeze or crush the tick's body. This could force disease-carrying fluids from the tick into your body.  5. After the tick is removed, wash the bite area and your hands with soap and water or other disinfectant such as alcohol. 6. Apply a small amount of antiseptic cream or ointment to the bite site.  7. Wash and disinfect any instruments that were used.  Do not try to remove a tick by applying a hot match, petroleum jelly, or fingernail polish to the tick. These methods do not work and may increase the chances of disease being spread from the tick bite.  WHEN SHOULD YOU SEEK MEDICAL CARE? Contact your health care provider if you are unable to remove a tick from your skin or if a part of the tick breaks off and is stuck in the skin.  After a tick bite, you need to  be aware of signs and symptoms that could be related to diseases spread by ticks. Contact your health care provider if you develop any of the following in the days or weeks after the tick bite:  Unexplained fever.  Rash. A circular rash that appears days or weeks after the tick bite may indicate the possibility of Lyme disease. The rash may resemble a target with a bull's-eye and may occur at a different part of your body than the tick bite.  Redness and swelling in the area of the tick bite.   Tender, swollen lymph glands.   Diarrhea.   Weight loss.   Cough.   Fatigue.   Muscle, joint, or bone pain.   Abdominal pain.    Headache.   Lethargy or a change in your level of consciousness.  Difficulty walking or moving your legs.   Numbness in the legs.   Paralysis.  Shortness of breath.   Confusion.   Repeated vomiting.  Document Released: 06/13/2000 Document Revised: 04/06/2013 Document Reviewed: 11/24/2012 [facility] Patient Information 2015 [place], [place]. This information is not intended to replace advice given to you by your health care provider. Make sure you discuss any questions you have with your health care provider.

## 2015-01-25 NOTE — ED Notes (Signed)
Patient is alert and orientedx4.  Patient was explained discharge instructions and they understood them with no questions.   

## 2015-01-25 NOTE — ED Notes (Signed)
Pt reports to the ED for eval of tick bite to right neck. He reports he first noticed it tonight just PTA. States the tick was attached and wants to be checked for rocky [place] spotted fever. Area where pt reports tick was attached there is only a small scab present. No erythema, increased warmth, drainage, or tenderness. Pt denies any fevers or chills. Pt A&Ox4, resp e/u, and skin warm and dry.

## 2015-01-29 ENCOUNTER — Encounter (HOSPITAL_COMMUNITY): Payer: Self-pay | Admitting: Emergency Medicine

## 2015-01-29 DIAGNOSIS — Z872 Personal history of diseases of the skin and subcutaneous tissue: Secondary | ICD-10-CM | POA: Insufficient documentation

## 2015-01-29 DIAGNOSIS — Z8659 Personal history of other mental and behavioral disorders: Secondary | ICD-10-CM | POA: Insufficient documentation

## 2015-01-29 DIAGNOSIS — I890 Lymphedema, not elsewhere classified: Secondary | ICD-10-CM | POA: Insufficient documentation

## 2015-01-29 DIAGNOSIS — Z79899 Other long term (current) drug therapy: Secondary | ICD-10-CM | POA: Insufficient documentation

## 2015-01-29 DIAGNOSIS — G8929 Other chronic pain: Secondary | ICD-10-CM | POA: Insufficient documentation

## 2015-01-29 DIAGNOSIS — Z590 Homelessness: Secondary | ICD-10-CM | POA: Insufficient documentation

## 2015-01-29 DIAGNOSIS — Z862 Personal history of diseases of the blood and blood-forming organs and certain disorders involving the immune mechanism: Secondary | ICD-10-CM | POA: Insufficient documentation

## 2015-01-29 NOTE — ED Notes (Addendum)
Pt. reports chronic right lower leg pain and right foot pain with swelling ( lymphedema) worse these past several days .

## 2015-01-30 ENCOUNTER — Emergency Department (HOSPITAL_COMMUNITY)
Admission: EM | Admit: 2015-01-30 | Discharge: 2015-01-30 | Disposition: A | Discharge status: Home / Self Care | Payer: Self-pay | Attending: Emergency Medicine | Admitting: Emergency Medicine

## 2015-01-30 DIAGNOSIS — I890 Lymphedema, not elsewhere classified: Secondary | ICD-10-CM

## 2015-01-30 DIAGNOSIS — G8929 Other chronic pain: Secondary | ICD-10-CM

## 2015-01-30 MED ORDER — ACETAMINOPHEN 500 MG PO TABS
1000.0000 mg | ORAL_TABLET | Freq: Once | ORAL | Status: AC
  Administered 2015-01-30: 1000 mg via ORAL
  Filled 2015-01-30: qty 2

## 2015-01-30 NOTE — ED Provider Notes (Signed)
CSN: [id number]     Arrival date & time 01/29/15  2249 History   First MD Initiated Contact with Patient 01/30/15 0050     Chief Complaint  Patient presents with  . Leg Pain     (Consider location/radiation/quality/duration/timing/severity/associated sxs/prior Treatment) Patient is a 44 y.o. Meadows presenting with leg pain. The history is provided by the patient. No language interpreter was used.  Leg Pain Location:  Leg Associated symptoms: no fever   Associated symptoms comment:  Patient with complaint of right leg pain and swelling. "I think I got infection in it." He is asking for pain medication. He has a history of chronic lymphedema with chronic pain. He does not endorse fever. He states is appears more red than usual.    Past Medical History  Diagnosis Date  . Anemia   . Paranoid schizophrenia   . Cellulitis   . Homelessness   . Lymphedema of leg    History reviewed. No pertinent past surgical history. Family History  Problem Relation Age of Onset  . CAD      Neg HX  . Hypertension      Neg Hx  . Diabetes      Neg Hx  . Cancer      Neg Hx   History  Substance Use Topics  . Smoking status: Never Smoker   . Smokeless tobacco: Never Used  . Alcohol Use: No    Review of Systems  Constitutional: Negative for fever and chills.  Respiratory: Negative.  Negative for shortness of breath.   Musculoskeletal:       See HPI.  Skin: Positive for color change.  Neurological: Negative.  Negative for numbness.      Allergies  Review of patient's allergies indicates no known allergies.  Home Medications   Prior to Admission medications   Medication Sig Start Date End Date Taking? Authorizing Provider  doxycycline (VIBRAMYCIN) 100 MG capsule Take 1 capsule (100 mg total) by mouth Jonathan (two) times daily. 01/25/15  Yes Hannah Muthersbaugh, PA-C  furosemide (LASIX) 20 MG tablet Take 20 mg by mouth daily. 01/17/15 02/16/15 Yes Historical Provider, MD  hydrochlorothiazide  (HYDRODIURIL) 25 MG tablet Take 1 tablet (25 mg total) by mouth daily. 11/15/14  Yes Dahlia Bailiff, PA-C  methocarbamol (ROBAXIN) 500 MG tablet Take 1 tablet (500 mg total) by mouth 4 (four) times daily. Patient not taking: Reported on 01/22/2015 11/16/14   Carlisle Cater, PA-C   BP 100/65 mmHg  Pulse 86  Temp(Src) 98.Jonathan F (36.8 C) (Oral)  Resp 16  Ht 6\' 1"  (1.854 m)  Wt 192 lb (87.091 kg)  BMI 25.34 kg/m2  SpO2 98% Physical Exam  Constitutional: He is oriented to person, place, and time. He appears well-developed and well-nourished.  Neck: Normal range of motion.  Pulmonary/Chest: Effort normal.  Musculoskeletal: Normal range of motion.  Right leg swollen c/w chronic lymphedema. Minimal redness without warmth. No lesions to leg or foot, including no lesions between toes. No oozing rash.  Neurological: He is alert and oriented to person, place, and time.  Skin: Skin is warm and dry.  Psychiatric: He has a normal mood and affect.    ED Course  Procedures (including critical care time) Labs Review Labs Reviewed - No data to display  Imaging Review No results found.   EKG Interpretation None      MDM   Final diagnoses:  None    1. Chronic lymphedema, right LE  No evidence infection: no fever, no tachycardia,  no evidence infection on exam. The patient is currently taking Clindamycin for previous infection and states he is taking this medication as prescribed. He is felt stable for discharge home.     Charlann Lange, PA-C 01/30/15 0109  Quintella Reichert, MD 01/30/15 [phone]

## 2015-01-30 NOTE — ED Notes (Signed)
Pt refuses to wear blood pressure cuff and pulse ox

## 2015-01-30 NOTE — ED Notes (Signed)
Pt refused discharge instructions, left without them, demanding a bus pass.  Escorted to security for escort off property.

## 2015-01-30 NOTE — Discharge Instructions (Signed)
YOU HAVE NO EVIDENCE OF INFECTION IN THE LEG THAT YOUR ANTIBIOTIC WILL NOT TREAT. KEEP TAKING THE ANTIBIOTIC AS PRESCRIBED UNTIL GONE. TAKE TYLENOL AS NEEDED FOR PAIN.  Lymphedema Lymphedema is a swelling caused by the abnormal collection of lymph under the skin. The lymph is fluid from the tissues in your body that travels in the lymphatic system. This system is part of the immune system that includes lymph nodes and vessels. The lymph vessels collect and carry the excess fluid, fats, proteins, and wastes from the tissues of the body to the bloodstream. This system also works to clean and remove bacteria and waste products from the body.  Lymphedema occurs when the lymphatic system is blocked. When the lymph vessels or lymph nodes are blocked or damaged, lymph does not drain properly. This causes abnormal build up of lymph. This leads to swelling in the arms or legs. Lymphedema cannot be cured by medicines. But the swelling can be reduced by physical methods. CAUSES  There are two types of lymphedema. Primary lymphedema is caused by the absence or abnormality of the lymph vessel at birth. It is also known as inherited lymphedema, which occurs rarely. Secondary or acquired lymphedema occurs when the lymph vessel is damaged or blocked. The causes of lymph vessel blockage are:   Skin infection like cellulites.  Infection by parasites (filariasis).  Injury.  Cancer.  Radiation therapy.  Formation of scar tissue.  Surgery. SYMPTOMS  The symptoms of lymphedema are:  Abnormal swelling of the arm or leg.  Heavy or tight feeling in your arm or leg.  Tight-fitting shoes or rings.  Redness of skin over the affected area.  Limited movement of the affected limb.  Some patients complain about sensitivity to touch and discomfort in the limb(s) affected. You may not have these symptoms immediately following injury. They usually appear within a few days or even years after injury. Inform your  caregiver, if you have any of these symptoms. Early treatment can avoid further problems.  DIAGNOSIS  First, your caregiver will inquire about any surgery you have had or medicines you are taking. He will then examine you. Your caregiver may order special imaging tests, such as:  Lymphoscintigraphy (a test in which a low dose of radioactive substance is injected to trace the flow of lymph through the lymph vessels).  MRI (imaging tests using magnetic fields).  Computed tomography (test using special cross-sectional X-rays).  Duplex ultrasound (test using high-frequency sound waves to show the vessels and the blood flow on a screen).  Lymphangiography (special X-ray taken after injecting a contrast dye into the lymph vessel). It is now rarely done. TREATMENT  Lymphedema can be treated in different ways. Your caregiver will decide the type of treatment depending on the cause. Treatment may include:  Exercise: Special exercises will help fluid move out easily from the affected part. This should be done as per your caregiver's advice.  Manual lymph drainage: Gentle massage of the affected limb makes the fluid to move out more freely.  Compression: Compression stockings or external pump apply pressure over the affected limb. This helps the fluid to move out from the arm or leg. Bandaging can also help to move the fluid out from the affected part. Your caregiver will decide the method that suits you the best.  Medicines: Your caregiver may prescribe antibiotics, if you have infection.  Surgery: Your caregiver may advise surgery for severe lymphedema. It is reserved for special cases when the patient has difficulty moving.  Your surgeon may remove excess tissue from the arm or leg. This will help to ease your movement. Physical therapy may have to be continued after surgery. HOME CARE INSTRUCTIONS  The area is very fragile and is predisposed to injury and infection.  Eat a healthy  diet.  Exercise regularly as per advice.  Keep the affected area clean and dry.  Use gloves while cooking or gardening.  Protect your skin from cuts.  Use electric razor to shave the affected area.  Keep affected limb elevated.  Do not wear tight clothes, shoes, or jewelry as it may cause the tissue to be strangled.  Do not use heat pads over the affected area.  Do not sit with cross legs.  Do not walk barefoot.  Do not carry weight on the affected arm.  Avoid having blood pressure checked on the affected limb. SEEK MEDICAL CARE IF:  You continue to have swelling in your limb. SEEK IMMEDIATE MEDICAL CARE IF:   You have high fever.  You have skin rash.  You have chills or sweats.  You have pain or redness.  You have a cut that does not heal. MAKE SURE YOU:   Understand these instructions.  Will watch your condition.  Will get help right away if you are not doing well or get worse. Document Released: 04/13/2007 Document Revised: 06/02/2012 Document Reviewed: 03/19/2009 [facility] Patient Information 2015 [place], [place]. This information is not intended to replace advice given to you by your health care provider. Make sure you discuss any questions you have with your health care provider.  Emergency Department Resource Guide 1) Find a Doctor and Pay Out of Pocket Although you won't have to find out who is covered by your insurance plan, it is a good idea to ask around and get recommendations. You will then need to call the office and see if the doctor you have chosen will accept you as a new patient and what types of options they offer for patients who are self-pay. Some doctors offer discounts or will set up payment plans for their patients who do not have insurance, but you will need to ask so you aren't surprised when you get to your appointment.  2) Contact Your Local Health Department Not all health departments have doctors that can see patients for sick visits,  but many do, so it is worth a call to see if yours does. If you don't know where your local health department is, you can check in your phone book. The CDC also has a tool to help you locate your state's health department, and many state websites also have listings of all of their local health departments.  3) Find a [facility] If your illness is not likely to be very severe or complicated, you may want to try a walk in clinic. These are popping up all over the country in pharmacies, drugstores, and shopping centers. They're usually staffed by nurse practitioners or physician assistants that have been trained to treat common illnesses and complaints. They're usually fairly quick and inexpensive. However, if you have serious medical issues or chronic medical problems, these are probably not your best option.  No Primary Care Doctor: - Call Health Connect at  [phone] - they can help you locate a primary care doctor that  accepts your insurance, provides certain services, etc. - Physician Referral Service- [phone]  Chronic Pain Problems: Organization         Address  Phone   Notes  [facility]  [phone] Patients need to be referred by their primary care doctor.   Medication Assistance: Organization         Address  Phone   Notes  [facility] Medication Lasting [facility] [address] [place], [place] [address] [phone] --Must be a resident of [facility] -- Must have NO insurance coverage whatsoever (no Medicaid/ Medicare, etc.) -- The pt. MUST have a primary care doctor that directs their care regularly and follows them in the community   Med[facility]ist  [phone]   [employer]  [phone]    Agencies that provide inexpensive medical care: Organization         Address  Phone   Notes  [place]  [phone]   [facility]    [phone]   [facility] [place], [place] [address] [phone]   [place] [address], [place] [phone]   Planned Parenthood    ([phone]   [facility]    [phone]   [place] and [place], [place] Phone:  [phone], Fax:  [phone] Hours of Operation:  9 am - 6 pm, M-F.  Also accepts Medicaid/Medicare and self-pay.  [facility] for [place] [address], [address], [place] Phone: [phone], Fax: [phone]. Hours of Operation:  8:30 am - 5:30 pm, M-F.  Also accepts Medicaid and self-pay.  [facility] [place] [address] [place] Phone: [phone]   [place], [place], [place] [phone], Ext. [phone] Mondays & Thursdays: 7-9 AM.  First 15 patients are seen on a first come, first serve basis.    [place] Providers:  Organization         Address  Phone   Notes  [facility] [address], [place] [phone] Also accepts self-pay patients.  [facility] [address], [place]  [phone]   [place], [address], [place] [phone]   [facility] [address], [place] [phone]   Lucianne Lei [address], [place]   [phone] Only accepts [place] Access [place] patients after they have their name applied to their card.   Self-Pay (no insurance) in [facility]:  Organization         Address  Phone   Notes  Sickle Cell Patients, [facility] Internal Medicine [place] [phone]   [facility] [place] [phone]   [facility] [place]  [place], [place], [place] [phone]   [facility]/Dr. Osei-Bonsu  [address],  [place] or [address], [place] [phone] Phone number for both [place] and [place] locations is the same.  Urgent Medical and [facility] [address] [place] [phone]   [facility] [address], [place] or [address] [phone]   [facility] [address] [place] [phone], phone; [phone], fax Sees patients 1st and 3rd Saturday of every month.  Must not qualify for public or private insurance (i.e. Medicaid, Medicare,  [place] Health Choice, Veterans' Benefits)  Household income should be no more than 200% of the poverty level The clinic cannot treat you if you are pregnant or think you are pregnant  Sexually transmitted diseases are not treated at the clinic.    Dental Care: Organization         Address  Phone  Notes  [facility] Department of [facility] [place] [phone] Accepts children up to age 41 who are enrolled in [place] or [place]; pregnant women with a Medicaid card; and children who have applied for Medicaid or [place] Health Choice, but were declined, whose parents can pay a reduced fee at time of service.  [facility] Department of [facility]  [address], [place] [phone] Accepts children up to age 4 who are enrolled in [place] or [place]; pregnant women with a Medicaid card; and children who have applied for Medicaid or [place] Health Choice, but were declined, whose parents can pay a reduced fee at time of service.  [place] Adult [facility] Access PROGRAM  [place] [phone] Patients are seen by appointment only. Walk-ins are not accepted. [place] will see patients 16 years of age and older. Monday - Tuesday (8am-5pm) Most Wednesdays (8:30-5pm) $30 per visit, cash only  [facility] Adult Dental Access PROGRAM  [address]  Dr, [facility] [phone] Patients are seen by appointment only. Walk-ins are not accepted. [place] will see patients 50 years of age and older. One Wednesday Evening (Monthly: Volunteer Based).  $30 per visit, cash only  [place]  [phone] for adults; Children under age 58, call Graduate Pediatric Dentistry at [phone]. Children aged 48-14, please call [phone] to request a pediatric application.  Dental services are provided in all areas of dental care including fillings, crowns and bridges, complete and partial dentures, implants, gum treatment, root canals, and extractions. Preventive care is also provided. Treatment is provided to both adults and children. Patients are selected via a lottery and there is often a waiting list.   [facility] [address] [place]  [phone] [email or web address]   [facility] [address] [place], [place] [phone], Ext. [phone] Second and Fourth Thursday of each month, opens at 6:30 AM; Clinic ends at 9 AM.  Patients are seen on a first-come first-served basis, and a limited number are seen during each clinic.   [facility]  [address] [place] Danker [place], [place] [phone]   Eligibility Requirements You must have lived in [place], [place], or [place] counties for at least the last three months.   You cannot be eligible for state or federal sponsored [employer], including [employer], [place], or [employer].   You generally cannot be eligible for healthcare insurance through your employer.    How to apply: Eligibility screenings are held every Tuesday and Wednesday afternoon from 1:00 pm until 4:00 pm. You do not need an appointment for the interview!  [facility] [address], [place], [place]   [place]  [place] Department  [place]  [phone]    Behavioral Health Resources in the Community: Intensive Outpatient Programs Organization         Address  Phone  Notes  [place]  [place] [address] [place], [place] [phone]   [facility]patient [address], [place], [place]   ADS: Alcohol & Drug Svcs [address], [place], [place]   [place] [address],  [place], [place] or [phone]   Substance Abuse Resources Organization         Address  Phone  Notes  Alcohol and Drug Services  [phone]   [place]  [phone]   [facility] [place]   Chinita Pester  [phone]   Residential & Outpatient Substance Abuse Program  [phone]   Psychological Services Organization         Address  Phone  Notes  [facility]  [place]  [phone]   [place] [address] [place] or [phone]    Mobile Crisis Teams Organization         Address  Phone  Notes  [facility], Mobile Crisis Care Unit  [phone]   [facility]  [address] [place], [place]   [place] [address] [place] [phone]    Self-Help/Support Groups Organization         Address  Phone             Notes  [place]. of [place] - variety of support groups  [place] Call for more information  Narcotics Anonymous (NA), [facility]aring [facility] [address], [employer] [place]  2 meetings at this location   Special educational needs teacher         Address  Phone  Notes  ASAP Residential Treatment [place],    [place]  [phone]   [facility]  [address] [place] [phone], [place], [place]   [place], [place]  [phone] Admissions: 8am-3pm M-F  Incentives Substance [place] [address],    [place], [place] [phone]   [facility] [address] [place], [place], [place]   [facility] [address]  [place], [place]   [facility] Programs - Intensive Outpatient [address] Kristeen [address], [place], [place]   [facility] ([address] [place].,  [place], [place] [phone] or [phone]   [facility] (RTS) [address] [place], [place] Accepts Medicaid  [facility] [place] [address]  [place] [phone] Substance Abuse/Addiction Treatment   [facility] Organization         Address  Phone  Notes  [facility]  [phone]   Domenic Schwab, PhD [address] Arlis [place], [place]   [phone] or [phone]   [place], [place] [phone]   [facility] [address], [place], [place] [phone] Insurance/Medicaid/sponsorship through [facility] and Families [address] [place]                                    [place], [place] [phone] Veteran [address], [place] [phone]    Dr. Adele Schilder  [phone]   [facility] Dept. 1) [address] [place] 2) [place] 3)  [address], [place] [phone]  [phone]   [facility] Child Abuse Hotline 737-285-6312 or [phone] (After Hours)

## 2015-02-09 ENCOUNTER — Emergency Department (HOSPITAL_COMMUNITY)
Admission: EM | Admit: 2015-02-09 | Discharge: 2015-02-10 | Disposition: A | Discharge status: Home / Self Care | Payer: Self-pay | Attending: Emergency Medicine | Admitting: Emergency Medicine

## 2015-02-09 ENCOUNTER — Encounter (HOSPITAL_COMMUNITY): Payer: Self-pay | Admitting: *Deleted

## 2015-02-09 DIAGNOSIS — I890 Lymphedema, not elsewhere classified: Secondary | ICD-10-CM | POA: Insufficient documentation

## 2015-02-09 DIAGNOSIS — Z590 Homelessness: Secondary | ICD-10-CM | POA: Insufficient documentation

## 2015-02-09 DIAGNOSIS — Z8659 Personal history of other mental and behavioral disorders: Secondary | ICD-10-CM | POA: Insufficient documentation

## 2015-02-09 DIAGNOSIS — Z862 Personal history of diseases of the blood and blood-forming organs and certain disorders involving the immune mechanism: Secondary | ICD-10-CM | POA: Insufficient documentation

## 2015-02-09 DIAGNOSIS — Z79899 Other long term (current) drug therapy: Secondary | ICD-10-CM | POA: Insufficient documentation

## 2015-02-09 DIAGNOSIS — Z872 Personal history of diseases of the skin and subcutaneous tissue: Secondary | ICD-10-CM | POA: Insufficient documentation

## 2015-02-09 DIAGNOSIS — Z792 Long term (current) use of antibiotics: Secondary | ICD-10-CM | POA: Insufficient documentation

## 2015-02-09 NOTE — ED Provider Notes (Signed)
CSN: [id number]   Arrival date & time 02/09/15 2219  History  This chart was scribed for  [name], MD by Altamease Oiler, ED Scribe. This patient was seen in room WA12/WA12 and the patient's care was started at 11:43 PM.  Chief Complaint  Patient presents with  . Leg Swelling    HPI The history is provided by the patient. No language interpreter was used.   Jonathan Meadows is a 44 y.o. male with PMHx of lymphedema of the right leg who presents to the Emergency Department complaining of recurrent right leg swelling with onset 3 days ago. He has had lymphedema for 2-3 years. Associated symptoms include chills and redness/rash at the RLE ("it's broke out"). Pt denies fever. He is in the process of establishing primary care.  Past Medical History  Diagnosis Date  . Anemia   . Paranoid schizophrenia   . Cellulitis   . Homelessness   . Lymphedema of leg     History reviewed. No pertinent past surgical history.  Family History  Problem Relation Age of Onset  . CAD      Neg HX  . Hypertension      Neg Hx  . Diabetes      Neg Hx  . Cancer      Neg Hx    Social History  Substance Use Topics  . Smoking status: Never Smoker   . Smokeless tobacco: Never Used  . Alcohol Use: No     Review of Systems 10 Systems reviewed and all are negative for acute change except as noted in the HPI.  Home Medications   Prior to Admission medications   Medication Sig Start Date End Date Taking? Authorizing Provider  doxycycline (VIBRAMYCIN) 100 MG capsule Take 1 capsule (100 mg total) by mouth 2 (two) times daily. 01/25/15  Yes Hannah Muthersbaugh, PA-C  furosemide (LASIX) 20 MG tablet Take 20 mg by mouth daily. 01/17/15 02/16/15 Yes Historical Provider, MD  hydrochlorothiazide (HYDRODIURIL) 25 MG tablet Take 1 tablet (25 mg total) by mouth daily. 11/15/14  Yes Dahlia Bailiff, PA-C  methocarbamol (ROBAXIN) 500 MG tablet Take 1 tablet (500 mg total) by mouth 4 (four) times daily. Patient not taking:  Reported on 01/22/2015 11/16/14   Carlisle Cater, PA-C    Allergies  Review of patient's allergies indicates no known allergies.  Triage Vitals: BP 123/81 mmHg  Pulse 80  Temp(Src) 98.3 F (36.8 C) (Oral)  Resp 19  SpO2 100%  Physical Exam  Constitutional: He is oriented to person, place, and time. Vital signs are normal. He appears well-developed and well-nourished.  Non-toxic appearance. He does not appear ill. No distress.  HENT:  Head: Normocephalic and atraumatic.  Nose: Nose normal.  Mouth/Throat: Oropharynx is clear and moist. No oropharyngeal exudate.  Eyes: Conjunctivae and EOM are normal. Pupils are equal, round, and reactive to light. No scleral icterus.  Neck: Normal range of motion. Neck supple. No tracheal deviation, no edema, no erythema and normal range of motion present. No thyroid mass and no thyromegaly present.  Cardiovascular: Normal rate, regular rhythm, S1 normal, S2 normal, normal heart sounds, intact distal pulses and normal pulses.  Exam reveals no gallop and no friction rub.   No murmur heard. Pulses:      Radial pulses are 2+ on the right side, and 2+ on the left side.       Dorsalis pedis pulses are 2+ on the right side, and 2+ on the left side.  Pulmonary/Chest: Effort normal  and breath sounds normal. No respiratory distress. He has no wheezes. He has no rhonchi. He has no rales.  Abdominal: Soft. Normal appearance and bowel sounds are normal. He exhibits no distension, no ascites and no mass. There is no hepatosplenomegaly. There is no tenderness. There is no rebound, no guarding and no CVA tenderness.  Musculoskeletal: Normal range of motion. He exhibits no tenderness.  Chronic RLE lymphedema No warmth, erythema, or TTP Edema is not pitting  Lymphadenopathy:    He has no cervical adenopathy.  Neurological: He is alert and oriented to person, place, and time. He has normal strength. No cranial nerve deficit or sensory deficit.  Skin: Skin is warm, dry  and intact. No petechiae and no rash noted. He is not diaphoretic. No erythema. No pallor.  Psychiatric: He has a normal mood and affect. His behavior is normal. Judgment normal.  Nursing note and vitals reviewed.   ED Course  Procedures   DIAGNOSTIC STUDIES: Oxygen Saturation is 100% on RA, normal by my interpretation.    COORDINATION OF CARE: 11:47 PM Discussed treatment plan with pt at bedside and pt agreed to plan.  Labs Review- Labs Reviewed - No data to display  Imaging Review No results found.  EKG Interpretation None      MDM   Final diagnoses:  None   Patient presents to the ED for his chronic lymphedema.  He is concern there is a blood clot or infection in the leg.  Physical exam does not support either diagnosis.  This is likely his chronic lymphedema that is bothering him.  He states lasix does help somewhat.  Will place him on a trial and encourage PCP follow up.  He was also given TED hose to go come with.  He is keeping the leg elevated as well.  He otherwise appears well and in NAD.  His VS remain within his normal limits and he is safe for DC.  I personally performed the services described in this documentation, which was scribed in my presence. The recorded information has been reviewed and is accurate.     [name], MD 02/09/15 2358

## 2015-02-09 NOTE — ED Notes (Signed)
Pt complains of pain, warmness and swelling in his right leg for the past 3 days. Pt has a hx of lymphedema. Edema noted to pt's right leg/foot.

## 2015-02-10 MED ORDER — FUROSEMIDE 40 MG PO TABS
40.0000 mg | ORAL_TABLET | Freq: Once | ORAL | Status: AC
  Administered 2015-02-10: 40 mg via ORAL
  Filled 2015-02-10: qty 1

## 2015-02-10 MED ORDER — FUROSEMIDE 40 MG PO TABS: 40.0000 mg | ORAL_TABLET | Freq: Every day | ORAL | Status: DC

## 2015-02-10 MED ORDER — ACETAMINOPHEN 500 MG PO TABS
1000.0000 mg | ORAL_TABLET | Freq: Once | ORAL | Status: AC
  Administered 2015-02-10: 1000 mg via ORAL
  Filled 2015-02-10: qty 2

## 2015-02-10 NOTE — ED Notes (Signed)
Patient wants a prescription for pain medications. It was explained to patient that doctor said he can take tylenol. Patient requested a sandwich and patient was given one.

## 2015-02-10 NOTE — Discharge Instructions (Signed)
Lymphedema Jonathan Meadows, your leg does not appear infected or that there is a blood clot.  Take lasix to help with the swelling and see a primary care doctor within 3 days for close follow up.  Keep stockings on your leg as often as possible.  Come back to the ED immediately for any worsening.  Thank you. Lymphedema is a swelling caused by the abnormal collection of lymph under the skin. The lymph is fluid from the tissues in your body that travels in the lymphatic system. This system is part of the immune system that includes lymph nodes and vessels. The lymph vessels collect and carry the excess fluid, fats, proteins, and wastes from the tissues of the body to the bloodstream. This system also works to clean and remove bacteria and waste products from the body.  Lymphedema occurs when the lymphatic system is blocked. When the lymph vessels or lymph nodes are blocked or damaged, lymph does not drain properly. This causes abnormal build up of lymph. This leads to swelling in the arms or legs. Lymphedema cannot be cured by medicines. But the swelling can be reduced by physical methods. CAUSES  There are two types of lymphedema. Primary lymphedema is caused by the absence or abnormality of the lymph vessel at birth. It is also known as inherited lymphedema, which occurs rarely. Secondary or acquired lymphedema occurs when the lymph vessel is damaged or blocked. The causes of lymph vessel blockage are:   Skin infection like cellulites.  Infection by parasites (filariasis).  Injury.  Cancer.  Radiation therapy.  Formation of scar tissue.  Surgery. SYMPTOMS  The symptoms of lymphedema are:  Abnormal swelling of the arm or leg.  Heavy or tight feeling in your arm or leg.  Tight-fitting shoes or rings.  Redness of skin over the affected area.  Limited movement of the affected limb.  Some patients complain about sensitivity to touch and discomfort in the limb(s) affected. You may not have  these symptoms immediately following injury. They usually appear within a few days or even years after injury. Inform your caregiver, if you have any of these symptoms. Early treatment can avoid further problems.  DIAGNOSIS  First, your caregiver will inquire about any surgery you have had or medicines you are taking. He will then examine you. Your caregiver may order special imaging tests, such as:  Lymphoscintigraphy (a test in which a low dose of radioactive substance is injected to trace the flow of lymph through the lymph vessels).  MRI (imaging tests using magnetic fields).  Computed tomography (test using special cross-sectional X-rays).  Duplex ultrasound (test using high-frequency sound waves to show the vessels and the blood flow on a screen).  Lymphangiography (special X-ray taken after injecting a contrast dye into the lymph vessel). It is now rarely done. TREATMENT  Lymphedema can be treated in different ways. Your caregiver will decide the type of treatment depending on the cause. Treatment may include:  Exercise: Special exercises will help fluid move out easily from the affected part. This should be done as per your caregiver's advice.  Manual lymph drainage: Gentle massage of the affected limb makes the fluid to move out more freely.  Compression: Compression stockings or external pump apply pressure over the affected limb. This helps the fluid to move out from the arm or leg. Bandaging can also help to move the fluid out from the affected part. Your caregiver will decide the method that suits you the best.  Medicines: Your caregiver  may prescribe antibiotics, if you have infection.  Surgery: Your caregiver may advise surgery for severe lymphedema. It is reserved for special cases when the patient has difficulty moving. Your surgeon may remove excess tissue from the arm or leg. This will help to ease your movement. Physical therapy may have to be continued after  surgery. HOME CARE INSTRUCTIONS  The area is very fragile and is predisposed to injury and infection.  Eat a healthy diet.  Exercise regularly as per advice.  Keep the affected area clean and dry.  Use gloves while cooking or gardening.  Protect your skin from cuts.  Use electric razor to shave the affected area.  Keep affected limb elevated.  Do not wear tight clothes, shoes, or jewelry as it may cause the tissue to be strangled.  Do not use heat pads over the affected area.  Do not sit with cross legs.  Do not walk barefoot.  Do not carry weight on the affected arm.  Avoid having blood pressure checked on the affected limb. SEEK MEDICAL CARE IF:  You continue to have swelling in your limb. SEEK IMMEDIATE MEDICAL CARE IF:   You have high fever.  You have skin rash.  You have chills or sweats.  You have pain or redness.  You have a cut that does not heal. MAKE SURE YOU:   Understand these instructions.  Will watch your condition.  Will get help right away if you are not doing well or get worse. Document Released: 04/13/2007 Document Revised: 06/02/2012 Document Reviewed: 03/19/2009 [facility] Patient Information 2015 [place], [place]. This information is not intended to replace advice given to you by your health care provider. Make sure you discuss any questions you have with your health care provider.

## 2015-02-26 ENCOUNTER — Emergency Department (HOSPITAL_COMMUNITY)
Admission: EM | Admit: 2015-02-26 | Discharge: 2015-02-26 | Disposition: A | Discharge status: Home / Self Care | Payer: Self-pay | Attending: Emergency Medicine | Admitting: Emergency Medicine

## 2015-02-26 ENCOUNTER — Encounter (HOSPITAL_COMMUNITY): Payer: Self-pay | Admitting: Emergency Medicine

## 2015-02-26 DIAGNOSIS — Z590 Homelessness: Secondary | ICD-10-CM | POA: Insufficient documentation

## 2015-02-26 DIAGNOSIS — Z8659 Personal history of other mental and behavioral disorders: Secondary | ICD-10-CM | POA: Insufficient documentation

## 2015-02-26 DIAGNOSIS — Z792 Long term (current) use of antibiotics: Secondary | ICD-10-CM | POA: Insufficient documentation

## 2015-02-26 DIAGNOSIS — Z79899 Other long term (current) drug therapy: Secondary | ICD-10-CM | POA: Insufficient documentation

## 2015-02-26 DIAGNOSIS — Z872 Personal history of diseases of the skin and subcutaneous tissue: Secondary | ICD-10-CM | POA: Insufficient documentation

## 2015-02-26 DIAGNOSIS — Z8679 Personal history of other diseases of the circulatory system: Secondary | ICD-10-CM | POA: Insufficient documentation

## 2015-02-26 DIAGNOSIS — R202 Paresthesia of skin: Secondary | ICD-10-CM | POA: Insufficient documentation

## 2015-02-26 DIAGNOSIS — Z862 Personal history of diseases of the blood and blood-forming organs and certain disorders involving the immune mechanism: Secondary | ICD-10-CM | POA: Insufficient documentation

## 2015-02-26 NOTE — ED Notes (Signed)
Pt states that he has had a tingling in his R small and ring finger x 2-3 days. Also states it is hard to bend the fingers. Alert and oriented.

## 2015-02-26 NOTE — ED Notes (Signed)
Pt given follow up information for [facility] and [facility]. Given a bus ticket. No other c/c.

## 2015-02-26 NOTE — Discharge Instructions (Signed)

## 2015-02-26 NOTE — ED Provider Notes (Signed)
CSN: [id number]     Arrival date & time 02/26/15  2134 History  This chart was scribed for Charlann Lange, working with Quintella Reichert, MD by Steva Colder, ED Scribe. The patient was seen in room WTR8/WTR8 at 10:53 PM.    Chief Complaint  Patient presents with  . Tingling      The history is provided by the patient. No language interpreter was used.    HPI Comments: Jonathan Meadows is a 44 y.o. male who presents to the Emergency Department complaining of tingling in right pinky and right ring finger onset 2-3 days. Pt denies the tingling radiating up his arm or the tingling being on both surfaces of his palm. Pt notes that he is ambidextrous. Pt does not currently work and he is on disability. He denies joint swelling, color change, wound, and any other symptoms. Pt has a PCP.   Past Medical History  Diagnosis Date  . Anemia   . Paranoid schizophrenia   . Cellulitis   . Homelessness   . Lymphedema of leg    History reviewed. No pertinent past surgical history. Family History  Problem Relation Age of Onset  . CAD      Neg HX  . Hypertension      Neg Hx  . Diabetes      Neg Hx  . Cancer      Neg Hx   Social History  Substance Use Topics  . Smoking status: Never Smoker   . Smokeless tobacco: Never Used  . Alcohol Use: No    Review of Systems  Musculoskeletal: Negative for joint swelling.  Skin: Negative for color change, rash and wound.  Neurological:       Tingling in right ring and right pinky finger      Allergies  Review of patient's allergies indicates no known allergies.  Home Medications   Prior to Admission medications   Medication Sig Start Date End Date Taking? Authorizing Provider  doxycycline (VIBRAMYCIN) 100 MG capsule Take 1 capsule (100 mg total) by mouth 2 (two) times daily. 01/25/15   Hannah Muthersbaugh, PA-C  furosemide (LASIX) 40 MG tablet Take 1 tablet (40 mg total) by mouth daily. 02/10/15   [name], MD  hydrochlorothiazide (HYDRODIURIL)  25 MG tablet Take 1 tablet (25 mg total) by mouth daily. 11/15/14   Dahlia Bailiff, PA-C  methocarbamol (ROBAXIN) 500 MG tablet Take 1 tablet (500 mg total) by mouth 4 (four) times daily. Patient not taking: Reported on 01/22/2015 11/16/14   Carlisle Cater, PA-C   BP 134/78 mmHg  Pulse 92  Temp(Src) 98 F (36.7 C) (Oral)  Resp 18  SpO2 98% Physical Exam  Constitutional: He is oriented to person, place, and time. He appears well-developed and well-nourished. No distress.  HENT:  Head: Normocephalic and atraumatic.  Eyes: EOM are normal.  Neck: Neck supple. No tracheal deviation present.  Cardiovascular: Normal rate.   Pulmonary/Chest: Effort normal. No respiratory distress.  Musculoskeletal: Normal range of motion.  No swelling of the right hand. No redness. Full ROM, full strength of all tendons. No discoloration. Cap refill intact.   Neurological: He is alert and oriented to person, place, and time.  Skin: Skin is warm and dry.  Psychiatric: He has a normal mood and affect. His behavior is normal.  Nursing note and vitals reviewed.   ED Course  Procedures (including critical care time) DIAGNOSTIC STUDIES: Oxygen Saturation is 98% on RA, nl by my interpretation.    COORDINATION OF CARE:  10:55 PM Discussed treatment plan with pt at bedside and pt agreed to plan.   Labs Review Labs Reviewed - No data to display  Imaging Review No results found. I have personally reviewed and evaluated these images and lab results as part of my medical decision-making.   EKG Interpretation None      MDM   Final diagnoses:  None    1. Paresthesia  Uncomplicated paresthesia limited to palmar ulnar hand. No concern for vascular or infectious cause.   I personally performed the services described in this documentation, which was scribed in my presence. The recorded information has been reviewed and is accurate.     Charlann Lange, PA-C 02/26/15 2304  Quintella Reichert, MD 02/27/15 [phone]

## 2015-02-27 ENCOUNTER — Encounter (HOSPITAL_COMMUNITY): Payer: Self-pay | Admitting: Emergency Medicine

## 2015-02-27 ENCOUNTER — Emergency Department (HOSPITAL_COMMUNITY)
Admission: EM | Admit: 2015-02-27 | Discharge: 2015-02-27 | Disposition: A | Discharge status: Home / Self Care | Payer: Self-pay | Attending: Emergency Medicine | Admitting: Emergency Medicine

## 2015-02-27 DIAGNOSIS — Z792 Long term (current) use of antibiotics: Secondary | ICD-10-CM | POA: Insufficient documentation

## 2015-02-27 DIAGNOSIS — L03115 Cellulitis of right lower limb: Secondary | ICD-10-CM | POA: Insufficient documentation

## 2015-02-27 DIAGNOSIS — Z590 Homelessness: Secondary | ICD-10-CM | POA: Insufficient documentation

## 2015-02-27 DIAGNOSIS — Z79899 Other long term (current) drug therapy: Secondary | ICD-10-CM | POA: Insufficient documentation

## 2015-02-27 DIAGNOSIS — Z8679 Personal history of other diseases of the circulatory system: Secondary | ICD-10-CM | POA: Insufficient documentation

## 2015-02-27 DIAGNOSIS — Z8659 Personal history of other mental and behavioral disorders: Secondary | ICD-10-CM | POA: Insufficient documentation

## 2015-02-27 DIAGNOSIS — Z862 Personal history of diseases of the blood and blood-forming organs and certain disorders involving the immune mechanism: Secondary | ICD-10-CM | POA: Insufficient documentation

## 2015-02-27 MED ORDER — SULFAMETHOXAZOLE-TRIMETHOPRIM 800-160 MG PO TABS
1.0000 | ORAL_TABLET | Freq: Once | ORAL | Status: AC
  Administered 2015-02-27: 1 via ORAL
  Filled 2015-02-27: qty 1

## 2015-02-27 MED ORDER — SULFAMETHOXAZOLE-TRIMETHOPRIM 800-160 MG PO TABS: 1.0000 | ORAL_TABLET | Freq: Two times a day (BID) | ORAL | Status: DC

## 2015-02-27 MED ORDER — CEPHALEXIN 500 MG PO CAPS: 500.0000 mg | ORAL_CAPSULE | Freq: Once | ORAL | Status: DC

## 2015-02-27 NOTE — Discharge Instructions (Signed)

## 2015-02-27 NOTE — ED Notes (Signed)
Pt is c/o leg and foot pain on the right side  Pt states he wants to be sure his circulation hasnt been cut off to that foot  Pt states he has a rash or something on his right leg  Pt is c/o itching and burning

## 2015-02-27 NOTE — ED Notes (Signed)
Patient was alert, oriented and stable upon discharge. RN went over AVS and patient had no further questions. Pt given a bus pass and a sprite.

## 2015-02-28 ENCOUNTER — Emergency Department (HOSPITAL_BASED_OUTPATIENT_CLINIC_OR_DEPARTMENT_OTHER)
Admission: EM | Admit: 2015-02-28 | Discharge: 2015-03-01 | Disposition: A | Discharge status: Home / Self Care | Payer: Self-pay | Attending: Emergency Medicine | Admitting: Emergency Medicine

## 2015-02-28 ENCOUNTER — Encounter (HOSPITAL_BASED_OUTPATIENT_CLINIC_OR_DEPARTMENT_OTHER): Payer: Self-pay

## 2015-02-28 ENCOUNTER — Emergency Department (HOSPITAL_BASED_OUTPATIENT_CLINIC_OR_DEPARTMENT_OTHER): Payer: Self-pay

## 2015-02-28 DIAGNOSIS — I890 Lymphedema, not elsewhere classified: Secondary | ICD-10-CM | POA: Insufficient documentation

## 2015-02-28 DIAGNOSIS — L03115 Cellulitis of right lower limb: Secondary | ICD-10-CM | POA: Insufficient documentation

## 2015-02-28 DIAGNOSIS — R609 Edema, unspecified: Secondary | ICD-10-CM

## 2015-02-28 DIAGNOSIS — Z8659 Personal history of other mental and behavioral disorders: Secondary | ICD-10-CM | POA: Insufficient documentation

## 2015-02-28 DIAGNOSIS — Z79899 Other long term (current) drug therapy: Secondary | ICD-10-CM | POA: Insufficient documentation

## 2015-02-28 DIAGNOSIS — Z590 Homelessness: Secondary | ICD-10-CM | POA: Insufficient documentation

## 2015-02-28 DIAGNOSIS — Z792 Long term (current) use of antibiotics: Secondary | ICD-10-CM | POA: Insufficient documentation

## 2015-02-28 NOTE — ED Provider Notes (Signed)
CSN: [id number]     Arrival date & time 02/27/15  2248 History   First MD Initiated Contact with Patient 02/27/15 2255     Chief Complaint  Patient presents with  . Foot Pain     (Consider location/radiation/quality/duration/timing/severity/associated sxs/prior Treatment) Patient is a 44 y.o. male presenting with lower extremity pain. The history is provided by the patient. No language interpreter was used.  Foot Pain This is a new problem. The current episode started today. Pertinent negatives include no chest pain, fever or nausea. Associated symptoms comments: Presents to the ED with complaint of right foot pain and swelling. No fever. He has chronic lymphendema of the right leg, also a history of cellulitis. No new injury. .    Past Medical History  Diagnosis Date  . Anemia   . Paranoid schizophrenia   . Cellulitis   . Homelessness   . Lymphedema of leg    History reviewed. No pertinent past surgical history. Family History  Problem Relation Age of Onset  . CAD      Neg HX  . Hypertension      Neg Hx  . Diabetes      Neg Hx  . Cancer      Neg Hx   Social History  Substance Use Topics  . Smoking status: Never Smoker   . Smokeless tobacco: Never Used  . Alcohol Use: No    Review of Systems  Constitutional: Negative for fever.  Respiratory: Negative for shortness of breath.   Cardiovascular: Negative for chest pain.  Gastrointestinal: Negative for nausea.  Musculoskeletal:       See HPI.  Skin: Positive for color change. Negative for wound.      Allergies  Review of patient's allergies indicates no known allergies.  Home Medications   Prior to Admission medications   Medication Sig Start Date End Date Taking? Authorizing Provider  doxycycline (VIBRAMYCIN) 100 MG capsule Take 1 capsule (100 mg total) by mouth 2 (two) times daily. 01/25/15   Hannah Muthersbaugh, PA-C  furosemide (LASIX) 40 MG tablet Take 1 tablet (40 mg total) by mouth daily. 02/10/15    [name], MD  hydrochlorothiazide (HYDRODIURIL) 25 MG tablet Take 1 tablet (25 mg total) by mouth daily. 11/15/14   Dahlia Bailiff, PA-C  methocarbamol (ROBAXIN) 500 MG tablet Take 1 tablet (500 mg total) by mouth 4 (four) times daily. Patient not taking: Reported on 01/22/2015 11/16/14   Carlisle Cater, PA-C  sulfamethoxazole-trimethoprim (BACTRIM DS,SEPTRA DS) 800-160 MG per tablet Take 1 tablet by mouth 2 (two) times daily. 02/27/15   Charlann Lange, PA-C   BP 99/66 mmHg  Pulse 88  Temp(Src) 98.2 F (36.8 C) (Oral)  Resp 16  SpO2 100% Physical Exam  Constitutional: He is oriented to person, place, and time. He appears well-developed and well-nourished. No distress.  Musculoskeletal:  Right leg swollen c/w lymphedema. Foot is mildly swollen and red. No lesions or rash. No drainage.   Neurological: He is alert and oriented to person, place, and time.  Ambulatory.  Skin: Skin is warm and dry. There is erythema.  Psychiatric: He has a normal mood and affect.    ED Course  Procedures (including critical care time) Labs Review Labs Reviewed - No data to display  Imaging Review No results found. I have personally reviewed and evaluated these images and lab results as part of my medical decision-making.   EKG Interpretation None      MDM   Final diagnoses:  Cellulitis of  right lower extremity  chronic lymphedema right leg.   The patient is evaluated by Dr. Venora Maples. Oral antibiotics started. He is felt stable for discharge home.     Charlann Lange, PA-C 02/28/15 2376  Jola Schmidt, MD 02/28/15 [phone]

## 2015-02-28 NOTE — ED Provider Notes (Signed)
CSN: [id number]     Arrival date & time 02/28/15  2146 History   This chart was scribed for Merryl Hacker, MD by Forrestine Him, ED Scribe. This patient was seen in room MH04/MH04 and the patient's care was started 11:29 PM.   Chief Complaint  Patient presents with  . Leg Swelling   The history is provided by the patient. No language interpreter was used.    HPI Comments: Jonathan Meadows is a 44 y.o. male with a PMHx of lymphedema who presents to the Emergency Department complaining of constant, ongoing R lower extremity swelling x 2-3 days. He also reports redness and discomfort to the leg. Pt was evaluated yesterday for same. At time of visit, pt was prescribed antibiotics but was not able to pick up the medication due to financial strain. No other at home remedies attempted prior to arrival. Denies any fever, chills, shortness of breath, or chest pain. No weakness, loss of sensation, or numbness. No known allergies to medications.  Past Medical History  Diagnosis Date  . Anemia   . Paranoid schizophrenia   . Cellulitis   . Homelessness   . Lymphedema of leg    History reviewed. No pertinent past surgical history. Family History  Problem Relation Age of Onset  . CAD      Neg HX  . Hypertension      Neg Hx  . Diabetes      Neg Hx  . Cancer      Neg Hx   Social History  Substance Use Topics  . Smoking status: Never Smoker   . Smokeless tobacco: Never Used  . Alcohol Use: No    Review of Systems  Constitutional: Negative for fever and chills.  Respiratory: Negative for cough and shortness of breath.   Cardiovascular: Positive for leg swelling. Negative for chest pain.  Gastrointestinal: Negative for nausea, vomiting and abdominal pain.  Musculoskeletal: Positive for arthralgias.  Neurological: Negative for headaches.  Psychiatric/Behavioral: Negative for confusion.  All other systems reviewed and are negative.     Allergies  Review of patient's allergies indicates  no known allergies.  Home Medications   Prior to Admission medications   Medication Sig Start Date End Date Taking? Authorizing Provider  doxycycline (VIBRAMYCIN) 100 MG capsule Take 1 capsule (100 mg total) by mouth 2 (two) times daily. 01/25/15   Hannah Muthersbaugh, PA-C  furosemide (LASIX) 40 MG tablet Take 1 tablet (40 mg total) by mouth daily. 02/10/15   [name], MD  hydrochlorothiazide (HYDRODIURIL) 25 MG tablet Take 1 tablet (25 mg total) by mouth daily. 11/15/14   Dahlia Bailiff, PA-C  sulfamethoxazole-trimethoprim (BACTRIM DS,SEPTRA DS) 800-160 MG per tablet Take 1 tablet by mouth 2 (two) times daily. 02/27/15   Charlann Lange, PA-C   Triage Vitals: BP 114/70 mmHg  Pulse 88  Temp(Src) 98.4 F (36.9 C) (Oral)  Resp 16  Ht 5\' 11"  (1.803 m)  Wt 203 lb 8 oz (92.307 kg)  BMI 28.40 kg/m2  SpO2 100%   Physical Exam  Constitutional: He is oriented to person, place, and time. No distress.  Disheveled, slurring of speech  HENT:  Head: Normocephalic and atraumatic.  Cardiovascular: Normal rate, regular rhythm and normal heart sounds.   No murmur heard. Pulmonary/Chest: Effort normal and breath sounds normal. No respiratory distress. He has no wheezes.  Abdominal: Soft. Bowel sounds are normal. There is no tenderness. There is no rebound.  Musculoskeletal: He exhibits no edema.  Neurological: He is alert and  oriented to person, place, and time.  Skin: Skin is warm and dry.  Edematous right lower extremity consistent with lymphedema, 3+ edema and mild erythema noted to the anterior right shin, no crepitus  Psychiatric: He has a normal mood and affect.  Nursing note and vitals reviewed.   ED Course  Procedures (including critical care time)  DIAGNOSTIC STUDIES: Oxygen Saturation is 98% on RA, Normal by my interpretation.    COORDINATION OF CARE: 11:51 PM- Will order venous Img lower unilateral R. Discussed treatment plan with pt at bedside and pt agreed to plan.     Labs  Review Labs Reviewed - No data to display  Imaging Review US Venous Img Lower Unilateral Right  03/01/2015   CLINICAL DATA:  Chronic lymphedema. Worsened swelling over the past 2 days in the right lower extremity.  EXAM: Right LOWER EXTREMITY VENOUS DOPPLER ULTRASOUND  TECHNIQUE: Gray-scale sonography with graded compression, as well as color Doppler and duplex ultrasound were performed to evaluate the lower extremity deep venous systems from the level of the common femoral vein and including the common femoral, femoral, profunda femoral, popliteal and calf veins including the posterior tibial, peroneal and gastrocnemius veins when visible. The superficial great saphenous vein was also interrogated. Spectral Doppler was utilized to evaluate flow at rest and with distal augmentation maneuvers in the common femoral, femoral and popliteal veins.  COMPARISON:  None.  FINDINGS: Contralateral Common Femoral Vein: Respiratory phasicity is normal and symmetric with the symptomatic side. No evidence of thrombus. Normal compressibility.  Common Femoral Vein: No evidence of thrombus. Normal compressibility, respiratory phasicity and response to augmentation.  Saphenofemoral Junction: No evidence of thrombus. Normal compressibility and flow on color Doppler imaging.  Profunda Femoral Vein: No evidence of thrombus. Normal compressibility and flow on color Doppler imaging.  Femoral Vein: No evidence of thrombus. Normal compressibility, respiratory phasicity and response to augmentation.  Popliteal Vein: No evidence of thrombus. Normal compressibility, respiratory phasicity and response to augmentation.  Calf Veins: No evidence of thrombus. Normal compressibility and flow on color Doppler imaging.  Superficial Great Saphenous Vein: No evidence of thrombus. Normal compressibility and flow on color Doppler imaging.  Venous Reflux:  None.  Other Findings: Marked soft tissue edema, causing technical challenges in the examination.   IMPRESSION: No evidence of deep venous thrombosis.   Electronically Signed   By: Andreas Newport M.D.   On: 03/01/2015 00:39   I have personally reviewed and evaluated these images and lab results as part of my medical decision-making.   EKG Interpretation None      MDM   Final diagnoses:  Swelling  Cellulitis of right lower extremity  Lymphedema    Patient presents with persistent pain as well as right lower extremity. History of lymphedema. Pain likely secondary to mild cellulitis. He has not gotten his antibiotics filled. Patient was given one oral dose of Bactrim. Ultrasound obtained and without evidence of DVT. Discussed with patient's the importance of getting his prescription filled. He states that he has a follow-up appointment with his primary physician.  He was given return precautions including increasing swelling and fever.  After history, exam, and medical workup I feel the patient has been appropriately medically screened and is safe for discharge home. Pertinent diagnoses were discussed with the patient. Patient was given return precautions.  I personally performed the services described in this documentation, which was scribed in my presence. The recorded information has been reviewed and is accurate.   Merryl Hacker,  MD 03/01/15 [id number]

## 2015-02-28 NOTE — ED Notes (Signed)
MD at bedside. 

## 2015-02-28 NOTE — ED Notes (Signed)
Pt c/o redness and edema to right leg that he states is different from his normal.  Pt was seen by this nurse last month and his leg does not appear to be any different from its chronic state.  Pt states he wants it checked for blood clots.  Pt has been seen several times for the same complaint.

## 2015-02-28 NOTE — ED Notes (Signed)
Pt received prescriptions last night and did not get them filled

## 2015-02-28 NOTE — ED Notes (Signed)
Patient transported to Ultrasound 

## 2015-02-28 NOTE — ED Notes (Signed)
C/o redness, swelling to right LE-pt was seen yesterday at [facility] ED for same

## 2015-03-01 MED ORDER — SULFAMETHOXAZOLE-TRIMETHOPRIM 800-160 MG PO TABS
1.0000 | ORAL_TABLET | Freq: Two times a day (BID) | ORAL | Status: DC
  Administered 2015-03-01: 1 via ORAL
  Filled 2015-03-01: qty 1

## 2015-03-01 MED ORDER — ACETAMINOPHEN 325 MG PO TABS
650.0000 mg | ORAL_TABLET | Freq: Once | ORAL | Status: AC
  Administered 2015-03-01: 650 mg via ORAL
  Filled 2015-03-01: qty 2

## 2015-03-01 NOTE — ED Notes (Signed)
Pt returned from US

## 2015-03-01 NOTE — Discharge Instructions (Signed)
You were seen today for warmth and swelling of her leg. This is related to her lymphedema. You may have an early infection. It is very important that you take your antibiotics filled from yesterday. If you develop fever or worsening pain you should be re-evaluated.  Cellulitis Cellulitis is an infection of the skin and the tissue beneath it. The infected area is usually red and tender. Cellulitis occurs most often in the arms and lower legs.  CAUSES  Cellulitis is caused by bacteria that enter the skin through cracks or cuts in the skin. The most common types of bacteria that cause cellulitis are staphylococci and streptococci. SIGNS AND SYMPTOMS   Redness and warmth.  Swelling.  Tenderness or pain.  Fever. DIAGNOSIS  Your health care provider can usually determine what is wrong based on a physical exam. Blood tests may also be done. TREATMENT  Treatment usually involves taking an antibiotic medicine. HOME CARE INSTRUCTIONS   Take your antibiotic medicine as directed by your health care provider. Finish the antibiotic even if you start to feel better.  Keep the infected arm or leg elevated to reduce swelling.  Apply a warm cloth to the affected area up to 4 times per day to relieve pain.  Take medicines only as directed by your health care provider.  Keep all follow-up visits as directed by your health care provider. SEEK MEDICAL CARE IF:   You notice red streaks coming from the infected area.  Your red area gets larger or turns dark in color.  Your bone or joint underneath the infected area becomes painful after the skin has healed.  Your infection returns in the same area or another area.  You notice a swollen bump in the infected area.  You develop new symptoms.  You have a fever. SEEK IMMEDIATE MEDICAL CARE IF:   You feel very sleepy.  You develop vomiting or diarrhea.  You have a general ill feeling (malaise) with muscle aches and pains. MAKE SURE YOU:    Understand these instructions.  Will watch your condition.  Will get help right away if you are not doing well or get worse. Document Released: 03/26/2005 Document Revised: 10/31/2013 Document Reviewed: 09/01/2011 [facility] Patient Information 2015 [place], [place]. This information is not intended to replace advice given to you by your health care provider. Make sure you discuss any questions you have with your health care provider.

## 2015-03-01 NOTE — ED Notes (Signed)
Pt advised to get prescriptions filled from previous visit, pt refused discharge instructions instead asking for a cab voucher and something else to eat.  Pt informed that the ED does not have said things and is walked out to waiting room.  Pt informed that he needs to leave property and is escorted off by security and [employer].

## 2015-03-01 NOTE — ED Notes (Signed)
Pt asking for a sandwich, something to drink and a bus pass.  Pt given a sprite and crackers and discharged to lobby.

## 2015-03-07 ENCOUNTER — Emergency Department (HOSPITAL_COMMUNITY)
Admission: EM | Admit: 2015-03-07 | Discharge: 2015-03-07 | Disposition: A | Discharge status: Home / Self Care | Payer: Self-pay | Attending: Emergency Medicine | Admitting: Emergency Medicine

## 2015-03-07 ENCOUNTER — Encounter (HOSPITAL_COMMUNITY): Payer: Self-pay | Admitting: Emergency Medicine

## 2015-03-07 ENCOUNTER — Emergency Department (HOSPITAL_COMMUNITY): Payer: Self-pay

## 2015-03-07 DIAGNOSIS — Z590 Homelessness: Secondary | ICD-10-CM | POA: Insufficient documentation

## 2015-03-07 DIAGNOSIS — Z79899 Other long term (current) drug therapy: Secondary | ICD-10-CM | POA: Insufficient documentation

## 2015-03-07 DIAGNOSIS — Z872 Personal history of diseases of the skin and subcutaneous tissue: Secondary | ICD-10-CM | POA: Insufficient documentation

## 2015-03-07 DIAGNOSIS — Z8679 Personal history of other diseases of the circulatory system: Secondary | ICD-10-CM | POA: Insufficient documentation

## 2015-03-07 DIAGNOSIS — Z862 Personal history of diseases of the blood and blood-forming organs and certain disorders involving the immune mechanism: Secondary | ICD-10-CM | POA: Insufficient documentation

## 2015-03-07 DIAGNOSIS — Z8659 Personal history of other mental and behavioral disorders: Secondary | ICD-10-CM | POA: Insufficient documentation

## 2015-03-07 DIAGNOSIS — R202 Paresthesia of skin: Secondary | ICD-10-CM | POA: Insufficient documentation

## 2015-03-07 DIAGNOSIS — Z792 Long term (current) use of antibiotics: Secondary | ICD-10-CM | POA: Insufficient documentation

## 2015-03-07 MED ORDER — NAPROXEN 500 MG PO TABS: 500.0000 mg | ORAL_TABLET | Freq: Two times a day (BID) | ORAL | Status: DC

## 2015-03-07 MED ORDER — NAPROXEN 250 MG PO TABS
375.0000 mg | ORAL_TABLET | Freq: Once | ORAL | Status: AC
  Administered 2015-03-07: 375 mg via ORAL
  Filled 2015-03-07: qty 2

## 2015-03-07 NOTE — ED Notes (Signed)
Pt arrives with R hand numbess for the last several days, denies injury, movement intact. CMS intact, equal griip strength.

## 2015-03-07 NOTE — ED Notes (Signed)
Patient left at this time with all belongings. 

## 2015-03-07 NOTE — ED Provider Notes (Signed)
CSN: [id number]     Arrival date & time 03/07/15  2214 History  This chart was scribed for Delos Haring, PA-C, working with Merrily Pew, MD by Starleen Arms, ED Scribe. This patient was seen in room TR10C/TR10C and the patient's care was started at 11:05 PM.   Chief Complaint  Patient presents with  . Hand Problem   The history is provided by the patient. No language interpreter was used.   HPI Comments: Gregory Barrick is a 44 y.o. male who presents to the Emergency Department complaining of a tingling sensation in the central right palm extending through the middle and ring finger onset 3 days ago without injury.  He denies  He has not taken anything for this complaint.  He denies weakness.  NKA. NO pain, no change in skin color., no rash  Past Medical History  Diagnosis Date  . Anemia   . Paranoid schizophrenia   . Cellulitis   . Homelessness   . Lymphedema of leg    History reviewed. No pertinent past surgical history. Family History  Problem Relation Age of Onset  . CAD      Neg HX  . Hypertension      Neg Hx  . Diabetes      Neg Hx  . Cancer      Neg Hx   Social History  Substance Use Topics  . Smoking status: Never Smoker   . Smokeless tobacco: Never Used  . Alcohol Use: No    Review of Systems A complete 10 system review of systems was obtained and all systems are negative except as noted in the HPI and PMH.   Allergies  Review of patient's allergies indicates no known allergies.  Home Medications   Prior to Admission medications   Medication Sig Start Date End Date Taking? Authorizing Provider  doxycycline (VIBRAMYCIN) 100 MG capsule Take 1 capsule (100 mg total) by mouth 2 (two) times daily. 01/25/15   Hannah Muthersbaugh, PA-C  furosemide (LASIX) 40 MG tablet Take 1 tablet (40 mg total) by mouth daily. 02/10/15   [name], MD  hydrochlorothiazide (HYDRODIURIL) 25 MG tablet Take 1 tablet (25 mg total) by mouth daily. 11/15/14   Dahlia Bailiff, PA-C  naproxen  (NAPROSYN) 500 MG tablet Take 1 tablet (500 mg total) by mouth 2 (two) times daily. 03/07/15   Tylicia Sherman Carlota Raspberry, PA-C  sulfamethoxazole-trimethoprim (BACTRIM DS,SEPTRA DS) 800-160 MG per tablet Take 1 tablet by mouth 2 (two) times daily. 02/27/15   Shari Upstill, PA-C   BP 108/71 mmHg  Pulse 69  Temp(Src) 98.2 F (36.8 C) (Oral)  Resp 16  Ht 6\' 1"  (1.854 m)  Wt 208 lb 4.8 oz (94.484 kg)  BMI 27.49 kg/m2  SpO2 98% Physical Exam  Constitutional: He is oriented to person, place, and time. He appears well-developed and well-nourished. No distress.  HENT:  Head: Normocephalic and atraumatic.  Eyes: Conjunctivae and EOM are normal.  Neck: Neck supple. No tracheal deviation present.  Cardiovascular: Normal rate.   Pulmonary/Chest: Effort normal. No respiratory distress.  Musculoskeletal: Normal range of motion.       Right hand: He exhibits normal range of motion, no tenderness, no bony tenderness, normal two-point discrimination, normal capillary refill, no deformity, no laceration and no swelling. Normal sensation noted. Normal strength noted.       Hands: Patient perceives sensation to touch as altered but is able to appreciate sensation to touch.   Neurological: He is alert and oriented to person, place, and  time.  Skin: Skin is warm and dry.  Psychiatric: He has a normal mood and affect. His behavior is normal.  Nursing note and vitals reviewed.   ED Course  Procedures (including critical care time)  DIAGNOSTIC STUDIES: Oxygen Saturation is 98% on RA, normal by my interpretation.    COORDINATION OF CARE:  11:10 PM Discussed plan to provide wrist splint.  Patient acknowledges and agrees with plan.    Labs Review Labs Reviewed - No data to display  Imaging Review Dg Hand Complete Right  03/07/2015   CLINICAL DATA:  Numbness in the third and fourth metacarpal area of the palm of hand for a few days. No known injury.  EXAM: RIGHT HAND - COMPLETE 3+ VIEW  COMPARISON:  None.  FINDINGS:  There is no evidence of fracture or dislocation. There is no evidence of arthropathy or other focal bone abnormality. Soft tissues are unremarkable.  IMPRESSION: Negative.   Electronically Signed   By: Lucienne Capers M.D.   On: 03/07/2015 22:46   I have personally reviewed and evaluated these images and lab results as part of my medical decision-making.   EKG Interpretation None      MDM   Final diagnoses:  Right hand paresthesia   Patients symptoms appear consistent with nerve etiology. No weakness or skin changes. Will put in hand splint, start on NSAID and refer to hand. Pt requests food and drink.  Xray of the hand shows no abnormalities.   Medications  naproxen (NAPROSYN) tablet 375 mg (not administered)    44 y.o.Jeneen Rinks Soules's evaluation in the Emergency Department is complete. It has been determined that no acute conditions requiring further emergency intervention are present at this time. The patient/guardian have been advised of the diagnosis and plan. We have discussed signs and symptoms that warrant return to the ED, such as changes or worsening in symptoms.  Vital signs are stable at discharge. Filed Vitals:   03/07/15 2252  BP:   Pulse:   Temp: 98.2 F (36.8 C)  Resp:     Patient/guardian has voiced understanding and agreed to follow-up with the PCP or specialist.   I personally performed the services described in this documentation, which was scribed in my presence. The recorded information has been reviewed and is accurate.    Delos Haring, PA-C 03/07/15 2317  Merrily Pew, MD 03/08/15 [phone]

## 2015-03-07 NOTE — Discharge Instructions (Signed)
Paresthesia Paresthesia is a burning or prickling feeling. This feeling can happen in any part of the body. It often happens in the hands, arms, legs, or feet. HOME CARE  Avoid drinking alcohol.  Try massage or needle therapy (acupuncture) to help with your problems.  Keep all doctor visits as told. GET HELP RIGHT AWAY IF:   You feel weak.  You have trouble walking or moving.  You have problems speaking or seeing.  You feel confused.  You cannot control when you poop (bowel movement) or pee (urinate).  You lose feeling (numbness) after an injury.  You pass out (faint).  Your burning or prickling feeling gets worse when you walk.  You have pain, cramps, or feel dizzy.  You have a rash. MAKE SURE YOU:   Understand these instructions.  Will watch your condition.  Will get help right away if you are not doing well or get worse. Document Released: 05/29/2008 Document Revised: 09/08/2011 Document Reviewed: 03/07/2011 [employer] Patient Information 2015 [employer]. This information is not intended to replace advice given to you by your health care provider. Make sure you discuss any questions you have with your health care provider.  

## 2015-03-12 ENCOUNTER — Encounter (HOSPITAL_COMMUNITY): Payer: Self-pay | Admitting: Emergency Medicine

## 2015-03-12 DIAGNOSIS — Z792 Long term (current) use of antibiotics: Secondary | ICD-10-CM | POA: Insufficient documentation

## 2015-03-12 DIAGNOSIS — Z8659 Personal history of other mental and behavioral disorders: Secondary | ICD-10-CM | POA: Insufficient documentation

## 2015-03-12 DIAGNOSIS — Z79899 Other long term (current) drug therapy: Secondary | ICD-10-CM | POA: Insufficient documentation

## 2015-03-12 DIAGNOSIS — Z872 Personal history of diseases of the skin and subcutaneous tissue: Secondary | ICD-10-CM | POA: Insufficient documentation

## 2015-03-12 DIAGNOSIS — R42 Dizziness and giddiness: Secondary | ICD-10-CM | POA: Insufficient documentation

## 2015-03-12 DIAGNOSIS — Z862 Personal history of diseases of the blood and blood-forming organs and certain disorders involving the immune mechanism: Secondary | ICD-10-CM | POA: Insufficient documentation

## 2015-03-12 DIAGNOSIS — Z590 Homelessness: Secondary | ICD-10-CM | POA: Insufficient documentation

## 2015-03-12 DIAGNOSIS — R51 Headache: Secondary | ICD-10-CM | POA: Insufficient documentation

## 2015-03-12 NOTE — ED Notes (Signed)
Pt. reports dizziness/lightheaded onset today , denies fever , alert and oriented /ambulatory .

## 2015-03-13 ENCOUNTER — Emergency Department (HOSPITAL_COMMUNITY)
Admission: EM | Admit: 2015-03-13 | Discharge: 2015-03-13 | Disposition: A | Discharge status: Home / Self Care | Payer: Self-pay | Attending: Emergency Medicine | Admitting: Emergency Medicine

## 2015-03-13 ENCOUNTER — Emergency Department (HOSPITAL_COMMUNITY)
Admission: EM | Admit: 2015-03-13 | Discharge: 2015-03-14 | Disposition: A | Discharge status: Home / Self Care | Payer: Self-pay | Attending: Emergency Medicine | Admitting: Emergency Medicine

## 2015-03-13 ENCOUNTER — Encounter (HOSPITAL_COMMUNITY): Payer: Self-pay | Admitting: *Deleted

## 2015-03-13 DIAGNOSIS — Z862 Personal history of diseases of the blood and blood-forming organs and certain disorders involving the immune mechanism: Secondary | ICD-10-CM | POA: Insufficient documentation

## 2015-03-13 DIAGNOSIS — Z8659 Personal history of other mental and behavioral disorders: Secondary | ICD-10-CM | POA: Insufficient documentation

## 2015-03-13 DIAGNOSIS — R110 Nausea: Secondary | ICD-10-CM | POA: Insufficient documentation

## 2015-03-13 DIAGNOSIS — Z79899 Other long term (current) drug therapy: Secondary | ICD-10-CM | POA: Insufficient documentation

## 2015-03-13 DIAGNOSIS — Z590 Homelessness: Secondary | ICD-10-CM | POA: Insufficient documentation

## 2015-03-13 DIAGNOSIS — R197 Diarrhea, unspecified: Secondary | ICD-10-CM | POA: Insufficient documentation

## 2015-03-13 DIAGNOSIS — Z8679 Personal history of other diseases of the circulatory system: Secondary | ICD-10-CM | POA: Insufficient documentation

## 2015-03-13 DIAGNOSIS — Z791 Long term (current) use of non-steroidal anti-inflammatories (NSAID): Secondary | ICD-10-CM | POA: Insufficient documentation

## 2015-03-13 DIAGNOSIS — Z872 Personal history of diseases of the skin and subcutaneous tissue: Secondary | ICD-10-CM | POA: Insufficient documentation

## 2015-03-13 DIAGNOSIS — R42 Dizziness and giddiness: Secondary | ICD-10-CM

## 2015-03-13 LAB — COMPREHENSIVE METABOLIC PANEL
ALT: 18 U/L (ref 17–63)
ANION GAP: 6 (ref 5–15)
AST: 24 U/L (ref 15–41)
Albumin: 3.3 g/dL — ABNORMAL LOW (ref 3.5–5.0)
Alkaline Phosphatase: 56 U/L (ref 38–126)
BUN: 10 mg/dL (ref 6–20)
CO2: 26 mmol/L (ref 22–32)
Calcium: 8.8 mg/dL — ABNORMAL LOW (ref 8.9–10.3)
Chloride: 105 mmol/L (ref 101–111)
Creatinine, Ser: 1.23 mg/dL (ref 0.61–1.24)
GFR calc Af Amer: >60 mL/min (ref >60)
GFR calc non Af Amer: >60 mL/min (ref >60)
Glucose, Bld: 114 mg/dL — ABNORMAL HIGH (ref 65–99)
Potassium: 3.2 mmol/L — ABNORMAL LOW (ref 3.5–5.1)
SODIUM: 137 mmol/L (ref 135–145)
TOTAL PROTEIN: 6.3 g/dL — AB (ref 6.5–8.1)
Total Bilirubin: 0.5 mg/dL (ref 0.3–1.2)

## 2015-03-13 LAB — CBC WITH DIFFERENTIAL/PLATELET
Basophils Absolute: 0 10*3/uL (ref 0.0–0.1)
Basophils Relative: 1 % (ref 0–1)
EOS ABS: 0.1 10*3/uL (ref 0.0–0.7)
Eosinophils Relative: 3 % (ref 0–5)
HCT: 35.8 % — ABNORMAL LOW (ref 39.0–52.0)
Hemoglobin: 12.1 g/dL — ABNORMAL LOW (ref 13.0–17.0)
LYMPHS ABS: 2 10*3/uL (ref 0.7–4.0)
Lymphocytes Relative: 47 % — ABNORMAL HIGH (ref 12–46)
MCH: 31.7 pg (ref 26.0–34.0)
MCHC: 33.8 g/dL (ref 30.0–36.0)
MCV: 93.7 fL (ref 78.0–100.0)
MONO ABS: 0.4 10*3/uL (ref 0.1–1.0)
MONOS PCT: 9 % (ref 3–12)
Neutro Abs: 1.7 10*3/uL (ref 1.7–7.7)
Neutrophils Relative %: 40 % — ABNORMAL LOW (ref 43–77)
Platelets: 222 10*3/uL (ref 150–400)
RBC: 3.82 MIL/uL — ABNORMAL LOW (ref 4.22–5.81)
RDW: 14 % (ref 11.5–15.5)
WBC: 4.2 10*3/uL (ref 4.0–10.5)

## 2015-03-13 NOTE — ED Provider Notes (Signed)
CSN: [id number]     Arrival date & time 03/12/15  2249 History  This chart was scribed for Jonathan Balls, MD by Randa Evens, ED Scribe. This patient was seen in room A02C/A02C and the patient's care was started at 2:21 AM.     Chief Complaint  Patient presents with  . Dizziness   Patient is a 44 y.o. male presenting with dizziness. The history is provided by the patient. No language interpreter was used.  Dizziness Quality:  Imbalance Severity:  Mild Duration:  2 days Relieved by:  None tried Worsened by:  Standing up Ineffective treatments:  None tried Associated symptoms: headaches   Associated symptoms: no weakness    HPI Comments: Jonathan Meadows is a 44 y.o. male who presents to the Emergency Department complaining of light headedness onset 2 days prior. Pt states that he has associated HA. Pt states that his symptoms are worse when walking, he states "it feels likes im going to fall over." Pt reports similar symptoms prior when his blood pressure was low. Pt doesn't report any other symptoms.    Past Medical History  Diagnosis Date  . Anemia   . Paranoid schizophrenia   . Cellulitis   . Homelessness   . Lymphedema of leg    History reviewed. No pertinent past surgical history. Family History  Problem Relation Age of Onset  . CAD      Neg HX  . Hypertension      Neg Hx  . Diabetes      Neg Hx  . Cancer      Neg Hx   Social History  Substance Use Topics  . Smoking status: Never Smoker   . Smokeless tobacco: Never Used  . Alcohol Use: No    Review of Systems  Neurological: Positive for dizziness, light-headedness and headaches. Negative for weakness and numbness.  All other systems reviewed and are negative.    Allergies  Review of patient's allergies indicates no known allergies.  Home Medications   Prior to Admission medications   Medication Sig Start Date End Date Taking? Authorizing Provider  doxycycline (VIBRAMYCIN) 100 MG capsule Take 1 capsule  (100 mg total) by mouth 2 (two) times daily. 01/25/15   Hannah Muthersbaugh, PA-C  furosemide (LASIX) 40 MG tablet Take 1 tablet (40 mg total) by mouth daily. 02/10/15   Jonathan Balls, MD  hydrochlorothiazide (HYDRODIURIL) 25 MG tablet Take 1 tablet (25 mg total) by mouth daily. 11/15/14   Dahlia Bailiff, PA-C  naproxen (NAPROSYN) 500 MG tablet Take 1 tablet (500 mg total) by mouth 2 (two) times daily. 03/07/15   Tiffany Carlota Raspberry, PA-C  sulfamethoxazole-trimethoprim (BACTRIM DS,SEPTRA DS) 800-160 MG per tablet Take 1 tablet by mouth 2 (two) times daily. 02/27/15   Shari Upstill, PA-C   BP 118/61 mmHg  Pulse 72  Temp(Src) 98.1 F (36.7 C) (Oral)  Resp 14  Ht 6\' 1"  (1.854 m)  Wt 214 lb (97.07 kg)  BMI 28.24 kg/m2  SpO2 99%   Physical Exam  Constitutional: He is oriented to person, place, and time. Vital signs are normal. He appears well-developed and well-nourished.  Non-toxic appearance. He does not appear ill. No distress.  HENT:  Head: Normocephalic and atraumatic.  Nose: Nose normal.  Mouth/Throat: Oropharynx is clear and moist. No oropharyngeal exudate.  Eyes: Conjunctivae and EOM are normal. Pupils are equal, round, and reactive to light. No scleral icterus.  Neck: Normal range of motion. Neck supple. No tracheal deviation, no edema, no erythema and  normal range of motion present. No thyroid mass and no thyromegaly present.  Cardiovascular: Normal rate, regular rhythm, S1 normal, S2 normal, normal heart sounds, intact distal pulses and normal pulses.  Exam reveals no gallop and no friction rub.   No murmur heard. Pulses:      Radial pulses are 2+ on the right side, and 2+ on the left side.       Dorsalis pedis pulses are 2+ on the right side, and 2+ on the left side.  Pulmonary/Chest: Effort normal and breath sounds normal. No respiratory distress. He has no wheezes. He has no rhonchi. He has no rales.  Abdominal: Soft. Normal appearance and bowel sounds are normal. He exhibits no distension, no  ascites and no mass. There is no hepatosplenomegaly. There is no tenderness. There is no rebound, no guarding and no CVA tenderness.  Musculoskeletal: Normal range of motion. He exhibits no edema or tenderness.  Lymphadenopathy:    He has no cervical adenopathy.  Neurological: He is alert and oriented to person, place, and time. He has normal strength. No cranial nerve deficit or sensory deficit.  Normal strength and sensation in all extremities. Normal cerebellar testing.   Skin: Skin is warm, dry and intact. No petechiae and no rash noted. He is not diaphoretic. No erythema. No pallor.  Psychiatric: He has a normal mood and affect. His behavior is normal. Judgment normal.  Nursing note and vitals reviewed.   ED Course  Procedures (including critical care time) DIAGNOSTIC STUDIES: Oxygen Saturation is 99% on RA, normal by my interpretation.    COORDINATION OF CARE: 2:27 AM-Discussed treatment plan with pt at bedside and pt agreed to plan.     Labs Review Labs Reviewed  CBC WITH DIFFERENTIAL/PLATELET - Abnormal; Notable for the following:    RBC 3.82 (*)    Hemoglobin 12.1 (*)    HCT 35.8 (*)    Neutrophils Relative % 40 (*)    Lymphocytes Relative 47 (*)    All other components within normal limits  COMPREHENSIVE METABOLIC PANEL - Abnormal; Notable for the following:    Potassium 3.2 (*)    Glucose, Bld 114 (*)    Calcium 8.8 (*)    Total Protein 6.3 (*)    Albumin 3.3 (*)    All other components within normal limits    Imaging Review No results found.    EKG Interpretation   Date/Time:  Tuesday March 13 2015 03:17:04 EDT Ventricular Rate:  72 PR Interval:  144 QRS Duration: 114 QT Interval:  401 QTC Calculation: 439 R Axis:   26 Text Interpretation:  Sinus rhythm Borderline intraventricular conduction  delay No old tracing to compare Confirmed by Glynn Octave [phone])  on 03/13/2015 4:12:13 AM      MDM   Final diagnoses:  None   Patient  presents to the emergency department for dizziness.  He denies any exacerbating factors. Currently he is not having dizziness. Neurological exam here is completely normal. Blood work is normal, I obtained EKG which does not show any cause for his dizziness. He appears well in no acute distress, he is requesting food and drink. His vital signs were within his normal limits he is safe for discharge.   I personally performed the services described in this documentation, which was scribed in my presence. The recorded information has been reviewed and is accurate.   Jonathan Balls, MD 03/13/15 [phone]

## 2015-03-13 NOTE — ED Notes (Signed)
NO answer when pt name called to triage

## 2015-03-13 NOTE — Discharge Instructions (Signed)
Dizziness Jonathan Meadows, see a primary care doctor within 3 days for close follow up of your dizziness.  If symptoms worsen, come back to the ED immediately.  Thank you.  Dizziness means you feel unsteady or lightheaded. You might feel like you are going to pass out (faint). HOME CARE   Drink enough fluids to keep your pee (urine) clear or pale yellow.  Take your medicines exactly as told by your doctor. If you take blood pressure medicine, always stand up slowly from the lying or sitting position. Hold on to something to steady yourself.  If you need to stand in one place for a long time, move your legs often. Tighten and relax your leg muscles.  Have someone stay with you until you feel okay.  Do not drive or use heavy machinery if you feel dizzy.  Do not drink alcohol. GET HELP RIGHT AWAY IF:   You feel dizzy or lightheaded and it gets worse.  You feel sick to your stomach (nauseous), or you throw up (vomit).  You have trouble talking or walking.  You feel weak or have trouble using your arms, hands, or legs.  You cannot think clearly or have trouble forming sentences.  You have chest pain, belly (abdominal) pain, sweating, or you are short of breath.  Your vision changes.  You are bleeding.  You have problems from your medicine that seem to be getting worse. MAKE SURE YOU:   Understand these instructions.  Will watch your condition.  Will get help right away if you are not doing well or get worse. Document Released: 06/05/2011 Document Revised: 09/08/2011 Document Reviewed: 06/05/2011 [facility] Patient Information 2015 [place], [place]. This information is not intended to replace advice given to you by your health care provider. Make sure you discuss any questions you have with your health care provider.

## 2015-03-13 NOTE — ED Notes (Signed)
Pt states that he began having diarrhea some time today; pt states that he has had diarrhea approx 7 times today; pt c/o chills and nausea; pt denies vomiting; pt was seen at [facility] last night and discharged at 0439 this am for dizziness; pt denies dizziness today.

## 2015-03-14 LAB — I-STAT CHEM 8, ED
BUN: 14 mg/dL (ref 6–20)
CHLORIDE: 104 mmol/L (ref 101–111)
CREATININE: 1.3 mg/dL — AB (ref 0.61–1.24)
Calcium, Ion: 1.14 mmol/L (ref 1.12–1.23)
Glucose, Bld: 94 mg/dL (ref 65–99)
HCT: 37 % — ABNORMAL LOW (ref 39.0–52.0)
Hemoglobin: 12.6 g/dL — ABNORMAL LOW (ref 13.0–17.0)
Potassium: 4.5 mmol/L (ref 3.5–5.1)
SODIUM: 137 mmol/L (ref 135–145)
TCO2: 25 mmol/L (ref 0–100)

## 2015-03-14 MED ORDER — LACTINEX PO PACK: PACK | ORAL | Status: DC

## 2015-03-14 NOTE — ED Provider Notes (Signed)
CSN: [id number]     Arrival date & time 03/13/15  2225 History   First MD Initiated Contact with Patient 03/14/15 0205     Chief Complaint  Patient presents with  . Diarrhea    (Consider location/radiation/quality/duration/timing/severity/associated sxs/prior Treatment) HPI Comments: 44 year old male with a history of paranoid schizophrenia, homelessness, and lymphedema of the leg presents to the emergency department for complaints of diarrhea which she states began yesterday evening. Patient denies any blood in his diarrhea. He cannot tell me how many episodes he has had. He states that they "come sporadically". Patient reporting 7 episodes of diarrhea in triage 3 hours ago. Patient also complaining of chills. He denies any fever, dizziness, syncope, or vomiting. He denies any distinct abdominal pain and denies trying any medications for his symptoms. Patient lying in bed watching videos on his cell phone.  Patient is a 44 y.o. male presenting with diarrhea. The history is provided by the patient. No language interpreter was used.  Diarrhea Associated symptoms: no fever and no vomiting     Past Medical History  Diagnosis Date  . Anemia   . Paranoid schizophrenia   . Cellulitis   . Homelessness   . Lymphedema of leg    History reviewed. No pertinent past surgical history. Family History  Problem Relation Age of Onset  . CAD      Neg HX  . Hypertension      Neg Hx  . Diabetes      Neg Hx  . Cancer      Neg Hx   Social History  Substance Use Topics  . Smoking status: Never Smoker   . Smokeless tobacco: Never Used  . Alcohol Use: No    Review of Systems  Constitutional: Negative for fever.  Gastrointestinal: Positive for nausea and diarrhea. Negative for vomiting and blood in stool.  Genitourinary: Negative for dysuria.  Neurological: Negative for dizziness and syncope.  All other systems reviewed and are negative.   Allergies  Review of patient's allergies indicates  no known allergies.  Home Medications   Prior to Admission medications   Medication Sig Start Date End Date Taking? Authorizing Provider  doxycycline (VIBRAMYCIN) 100 MG capsule Take 1 capsule (100 mg total) by mouth 2 (two) times daily. Patient not taking: Reported on 03/13/2015 01/25/15   Jarrett Soho Muthersbaugh, PA-C  furosemide (LASIX) 40 MG tablet Take 1 tablet (40 mg total) by mouth daily. 02/10/15   [name], MD  hydrochlorothiazide (HYDRODIURIL) 25 MG tablet Take 1 tablet (25 mg total) by mouth daily. 11/15/14   Dahlia Bailiff, PA-C  Lactobacillus (LACTINEX) PACK Mix a half of a packet with soft food and take twice per day for 5 days 03/14/15   Antonietta Breach, PA-C  naproxen (NAPROSYN) 500 MG tablet Take 1 tablet (500 mg total) by mouth 2 (two) times daily. 03/07/15   Tiffany Carlota Raspberry, PA-C  sulfamethoxazole-trimethoprim (BACTRIM DS,SEPTRA DS) 800-160 MG per tablet Take 1 tablet by mouth 2 (two) times daily. 02/27/15   Shari Upstill, PA-C   BP 112/59 mmHg  Pulse 64  Temp(Src) 97.7 F (36.5 C) (Oral)  Resp 18  Ht 6\' 1"  (1.854 m)  Wt 214 lb (97.07 kg)  BMI 28.24 kg/m2  SpO2 100%   Physical Exam  Constitutional: He is oriented to person, place, and time. He appears well-developed and well-nourished. No distress.  Nontoxic/nonseptic appearing  HENT:  Head: Normocephalic and atraumatic.  Eyes: Conjunctivae and EOM are normal. No scleral icterus.  Neck: Normal range  of motion.  Cardiovascular: Normal rate, regular rhythm and intact distal pulses.   Pulmonary/Chest: Effort normal. No respiratory distress.  Respirations even and unlabored  Abdominal: Soft. Bowel sounds are normal. He exhibits no distension. There is no rebound and no guarding.  Mild generalized tenderness to palpation. No focal tenderness or masses. No peritoneal signs. Normoactive bowel sounds heard in all quadrants. Abdomen soft.  Musculoskeletal: Normal range of motion.  Neurological: He is alert and oriented to person, place,  and time. He exhibits normal muscle tone. Coordination normal.  GCS 15. Patient ambulatory with steady gait.  Skin: Skin is warm and dry. No rash noted. He is not diaphoretic. No erythema. No pallor.  Psychiatric: He has a normal mood and affect. His behavior is normal.  Nursing note and vitals reviewed.   ED Course  Procedures (including critical care time) Labs Review Labs Reviewed  I-STAT CHEM 8, ED - Abnormal; Notable for the following:    Creatinine, Ser 1.30 (*)    Hemoglobin 12.6 (*)    HCT 37.0 (*)    All other components within normal limits    Imaging Review No results found.   I have personally reviewed and evaluated these images and lab results as part of my medical decision-making.   EKG Interpretation None      MDM   Final diagnoses:  Diarrhea    44 year old male presents to the emergency department for evaluation of diarrhea which has been ongoing since yesterday evening. Patient cannot quantify how many episodes of diarrhea he has had. Of note, patient was seen and evaluated 24 hours ago at [facility] for dizziness. He has no complaints of dizziness today.  Patient has no focal abdominal tenderness on exam. Abdomen is soft and without masses. He is afebrile and well-appearing. Vitals are stable and chemistry panels consistent with prior evaluations. Patient has been unable to provide a stool sample in the emergency department for over 1 hour. Symptoms are not aggressive enough to be a concern for dehydration at this time. Diarrhea has been nonbloody. Suspect viral vs dietary etiology. Will plan to d/c with Lactinex and instruction for PCP follow up. Return precautions given at discharge. Patient discharged in good condition.   Filed Vitals:   03/13/15 2344 03/14/15 0246  BP: 124/77 112/59  Pulse: 81 64  Temp: 97.8 F (36.6 C) 97.7 F (36.5 C)  TempSrc: Oral Oral  Resp: 20 18  Height: 6\' 1"  (1.854 m)   Weight: 214 lb (97.07 kg)   SpO2: 100%  100%     Antonietta Breach, PA-C 03/14/15 [place], MD 03/15/15 [phone]

## 2015-03-14 NOTE — Discharge Instructions (Signed)

## 2015-03-27 ENCOUNTER — Emergency Department (HOSPITAL_COMMUNITY): Admission: EM | Admit: 2015-03-27 | Discharge: 2015-03-28 | Discharge status: Against Medical Advice | Payer: Self-pay

## 2015-07-09 ENCOUNTER — Encounter (HOSPITAL_COMMUNITY): Payer: Self-pay | Admitting: Emergency Medicine

## 2015-07-09 ENCOUNTER — Emergency Department (HOSPITAL_COMMUNITY)
Admission: EM | Admit: 2015-07-09 | Discharge: 2015-07-09 | Disposition: A | Discharge status: Home / Self Care | Payer: Self-pay | Attending: Emergency Medicine | Admitting: Emergency Medicine

## 2015-07-09 DIAGNOSIS — Z79899 Other long term (current) drug therapy: Secondary | ICD-10-CM | POA: Insufficient documentation

## 2015-07-09 DIAGNOSIS — Z872 Personal history of diseases of the skin and subcutaneous tissue: Secondary | ICD-10-CM | POA: Insufficient documentation

## 2015-07-09 DIAGNOSIS — Z791 Long term (current) use of non-steroidal anti-inflammatories (NSAID): Secondary | ICD-10-CM | POA: Insufficient documentation

## 2015-07-09 DIAGNOSIS — Z8659 Personal history of other mental and behavioral disorders: Secondary | ICD-10-CM | POA: Insufficient documentation

## 2015-07-09 DIAGNOSIS — Z590 Homelessness: Secondary | ICD-10-CM | POA: Insufficient documentation

## 2015-07-09 DIAGNOSIS — R519 Headache, unspecified: Secondary | ICD-10-CM

## 2015-07-09 DIAGNOSIS — Z862 Personal history of diseases of the blood and blood-forming organs and certain disorders involving the immune mechanism: Secondary | ICD-10-CM | POA: Insufficient documentation

## 2015-07-09 DIAGNOSIS — Z792 Long term (current) use of antibiotics: Secondary | ICD-10-CM | POA: Insufficient documentation

## 2015-07-09 DIAGNOSIS — R51 Headache: Secondary | ICD-10-CM | POA: Insufficient documentation

## 2015-07-09 DIAGNOSIS — Z8679 Personal history of other diseases of the circulatory system: Secondary | ICD-10-CM | POA: Insufficient documentation

## 2015-07-09 MED ORDER — IBUPROFEN 400 MG PO TABS
400.0000 mg | ORAL_TABLET | Freq: Once | ORAL | Status: AC
  Administered 2015-07-09: 400 mg via ORAL

## 2015-07-09 MED ORDER — ACETAMINOPHEN 500 MG PO TABS
1000.0000 mg | ORAL_TABLET | Freq: Once | ORAL | Status: AC
  Administered 2015-07-09: 1000 mg via ORAL
  Filled 2015-07-09: qty 2

## 2015-07-09 MED ORDER — IBUPROFEN 400 MG PO TABS
ORAL_TABLET | ORAL | Status: AC
  Filled 2015-07-09: qty 1

## 2015-07-09 NOTE — ED Notes (Signed)
Pt from home for eval of sudden headache that started today. Pt states light sensitivity, denies any n/v/d or numbness/tingling to arms or legs. Pt in nad at this time.

## 2015-07-09 NOTE — Discharge Instructions (Signed)
TAKE TYLENOL OR IBUPROFEN FOR PAIN, WHICH CAN BE PURCHASED AT ANY DRUG OR GROCERY STORE. FOLLOW UP WITH YOUR DOCTOR IF HEADACHES PERSIST OR CALL THE [facility] OR A PRIMARY CARE PROVIDER OF YOUR CHOICE TO ESTABLISH CARE. RETURN HERE WITH ANY WORSENING SYMPTOMS OR NEW CONCERNS.    General Headache Without Cause A headache is pain or discomfort felt around the head or neck area. There are many causes and types of headaches. In some cases, the cause may not be found.  HOME CARE  Managing Pain  Take over-the-counter and prescription medicines only as told by your doctor.  Lie down in a dark, quiet room when you have a headache.  If directed, apply ice to the head and neck area:  Put ice in a plastic bag.  Place a towel between your skin and the bag.  Leave the ice on for 20 minutes, 2-3 times per day.  Use a heating pad or hot shower to apply heat to the head and neck area as told by your doctor.  Keep lights dim if bright lights bother you or make your headaches worse. Eating and Drinking  Eat meals on a regular schedule.  Lessen how much alcohol you drink.  Lessen how much caffeine you drink, or stop drinking caffeine. General Instructions  Keep all follow-up visits as told by your doctor. This is important.  Keep a journal to find out if certain things bring on headaches. For example, write down:  What you eat and drink.  How much sleep you get.  Any change to your diet or medicines.  Relax by getting a massage or doing other relaxing activities.  Lessen stress.  Sit up straight. Do not tighten (tense) your muscles.  Do not use tobacco products. This includes cigarettes, chewing tobacco, or e-cigarettes. If you need help quitting, ask your doctor.  Exercise regularly as told by your doctor.  Get enough sleep. This often means 7-9 hours of sleep. GET HELP IF:  Your symptoms are not helped by medicine.  You have a headache that feels different than the  other headaches.  You feel sick to your stomach (nauseous) or you throw up (vomit).  You have a fever. GET HELP RIGHT AWAY IF:   Your headache becomes really bad.  You keep throwing up.  You have a stiff neck.  You have trouble seeing.  You have trouble speaking.  You have pain in the eye or ear.  Your muscles are weak or you lose muscle control.  You lose your balance or have trouble walking.  You feel like you will pass out (faint) or you pass out.  You have confusion.   This information is not intended to replace advice given to you by your health care provider. Make sure you discuss any questions you have with your health care provider.   Document Released: 03/25/2008 Document Revised: 03/07/2015 Document Reviewed: 10/09/2014 Elsevier Interactive Patient Education [employer].

## 2015-07-09 NOTE — ED Provider Notes (Signed)
CSN: [id number]     Arrival date & time 07/09/15  2118 History   First MD Initiated Contact with Patient 07/09/15 2237     Chief Complaint  Patient presents with  . Headache     (Consider location/radiation/quality/duration/timing/severity/associated sxs/prior Treatment) Patient is a 45 y.o. male presenting with headaches. The history is provided by the patient. No language interpreter was used.  Headache Pain location:  Frontal Quality:  Dull Radiates to:  Does not radiate Timing:  Intermittent Chronicity:  Recurrent Similar to prior headaches: yes   Relieved by:  None tried Ineffective treatments:  None tried Associated symptoms: no abdominal pain, no congestion, no cough, no fever, no nausea, no neck pain, no neck stiffness, no sore throat and no vomiting   Associated symptoms comment:  Patient presents with bilateral frontal headache that started earlier today. No nausea, vomiting, fever, visual changes. He states it is like previous headaches that are intermittent. No head injury, syncope or near syncope. He has not taken anything at home to relieve symptoms.    Past Medical History  Diagnosis Date  . Anemia   . Paranoid schizophrenia ([place])   . Cellulitis   . Homelessness   . Lymphedema of leg    History reviewed. No pertinent past surgical history. Family History  Problem Relation Age of Onset  . CAD      Neg HX  . Hypertension      Neg Hx  . Diabetes      Neg Hx  . Cancer      Neg Hx   Social History  Substance Use Topics  . Smoking status: Never Smoker   . Smokeless tobacco: Never Used  . Alcohol Use: No    Review of Systems  Constitutional: Negative for fever and chills.  HENT: Negative for congestion and sore throat.   Respiratory: Negative.  Negative for cough and shortness of breath.   Cardiovascular: Negative.  Negative for chest pain.  Gastrointestinal: Negative.  Negative for nausea, vomiting and abdominal pain.  Musculoskeletal: Negative.   Negative for neck pain and neck stiffness.  Skin: Negative.  Negative for rash.  Neurological: Positive for headaches.      Allergies  Review of patient's allergies indicates no known allergies.  Home Medications   Prior to Admission medications   Medication Sig Start Date End Date Taking? Authorizing Provider  doxycycline (VIBRAMYCIN) 100 MG capsule Take 1 capsule (100 mg total) by mouth 2 (two) times daily. Patient not taking: Reported on 03/13/2015 01/25/15   Jarrett Soho Muthersbaugh, PA-C  furosemide (LASIX) 40 MG tablet Take 1 tablet (40 mg total) by mouth daily. 02/10/15   [name], MD  hydrochlorothiazide (HYDRODIURIL) 25 MG tablet Take 1 tablet (25 mg total) by mouth daily. 11/15/14   Dahlia Bailiff, PA-C  Lactobacillus (LACTINEX) PACK Mix a half of a packet with soft food and take twice per day for 5 days 03/14/15   Antonietta Breach, PA-C  naproxen (NAPROSYN) 500 MG tablet Take 1 tablet (500 mg total) by mouth 2 (two) times daily. 03/07/15   Tiffany Carlota Raspberry, PA-C  sulfamethoxazole-trimethoprim (BACTRIM DS,SEPTRA DS) 800-160 MG per tablet Take 1 tablet by mouth 2 (two) times daily. 02/27/15   Judyann Casasola, PA-C   BP 127/89 mmHg  Pulse 70  Temp(Src) 98 F (36.7 C) (Oral)  Resp 16  Ht 6\' 1"  (1.854 m)  Wt 91.128 kg  BMI 26.51 kg/m2  SpO2 98% Physical Exam  Constitutional: He is oriented to person, place, and time.  He appears well-developed and well-nourished. No distress.  HENT:  Head: Normocephalic and atraumatic.  Right Ear: Tympanic membrane normal.  Left Ear: Tympanic membrane normal.  Eyes: Conjunctivae are normal. Pupils are equal, round, and reactive to light.  Neck: Normal range of motion.  Cardiovascular: Normal rate.   No murmur heard. No carotid bruit.  Pulmonary/Chest: Effort normal. He has no wheezes. He has no rales.  Abdominal: Soft. There is no tenderness.  Musculoskeletal: Normal range of motion.  Right LE extremity lymphedema, chronic. No redness or drainage.    Neurological: He is alert and oriented to person, place, and time.  CN's 3-12 intact. Ambulatory without imbalance. No deficits of coordination. Speech clear and focused.   Skin: Skin is warm and dry.  Psychiatric: He has a normal mood and affect.    ED Course  Procedures (including critical care time) Labs Review Labs Reviewed - No data to display  Imaging Review No results found. I have personally reviewed and evaluated these images and lab results as part of my medical decision-making.   EKG Interpretation None      MDM   Final diagnoses:  None    1. Nonspecific headache  The patient has a normal neurologic exam without deficit or concerning finding. Headache is similar to previous headaches. Ibuprofen and Tylenol provided in ED. No CT scan felt beneficial in evaluation or for treatment. He is asking for a meal and drink, which is provided. He is felt stable for discharge without concern for intracranial process.     Charlann Lange, PA-C 07/09/15 [id number]  Dorie Rank, MD 07/09/15 [phone]

## 2015-09-05 ENCOUNTER — Encounter (HOSPITAL_COMMUNITY): Payer: Self-pay | Admitting: Emergency Medicine

## 2015-09-05 DIAGNOSIS — Z8659 Personal history of other mental and behavioral disorders: Secondary | ICD-10-CM | POA: Insufficient documentation

## 2015-09-05 DIAGNOSIS — Z590 Homelessness: Secondary | ICD-10-CM | POA: Insufficient documentation

## 2015-09-05 DIAGNOSIS — Z862 Personal history of diseases of the blood and blood-forming organs and certain disorders involving the immune mechanism: Secondary | ICD-10-CM | POA: Insufficient documentation

## 2015-09-05 DIAGNOSIS — L0231 Cutaneous abscess of buttock: Secondary | ICD-10-CM | POA: Insufficient documentation

## 2015-09-05 DIAGNOSIS — Z79899 Other long term (current) drug therapy: Secondary | ICD-10-CM | POA: Insufficient documentation

## 2015-09-05 DIAGNOSIS — Z791 Long term (current) use of non-steroidal anti-inflammatories (NSAID): Secondary | ICD-10-CM | POA: Insufficient documentation

## 2015-09-05 NOTE — ED Notes (Signed)
Pt reports boils to buttox and also swelling to R leg (hx of lymphedema but sts it is more swollen and painful).

## 2015-09-06 ENCOUNTER — Emergency Department (HOSPITAL_COMMUNITY)
Admission: EM | Admit: 2015-09-06 | Discharge: 2015-09-06 | Disposition: A | Discharge status: Home / Self Care | Payer: Self-pay | Attending: Emergency Medicine | Admitting: Emergency Medicine

## 2015-09-06 DIAGNOSIS — L0291 Cutaneous abscess, unspecified: Secondary | ICD-10-CM

## 2015-09-06 MED ORDER — LIDOCAINE HCL 2 % IJ SOLN
5.0000 mL | Freq: Once | INTRAMUSCULAR | Status: AC
  Administered 2015-09-06: 100 mg
  Filled 2015-09-06: qty 20

## 2015-09-06 MED ORDER — SULFAMETHOXAZOLE-TRIMETHOPRIM 800-160 MG PO TABS: 1.0000 | ORAL_TABLET | Freq: Two times a day (BID) | ORAL | Status: DC

## 2015-09-06 MED ORDER — TRAMADOL HCL 50 MG PO TABS: 50.0000 mg | ORAL_TABLET | Freq: Four times a day (QID) | ORAL | Status: DC | PRN

## 2015-09-06 MED ORDER — SULFAMETHOXAZOLE-TRIMETHOPRIM 800-160 MG PO TABS
1.0000 | ORAL_TABLET | Freq: Once | ORAL | Status: AC
  Administered 2015-09-06: 1 via ORAL
  Filled 2015-09-06: qty 1

## 2015-09-06 MED ORDER — TRAMADOL HCL 50 MG PO TABS
50.0000 mg | ORAL_TABLET | Freq: Once | ORAL | Status: AC
Start: 2015-09-06 — End: 2015-09-06
  Administered 2015-09-06: 50 mg via ORAL
  Filled 2015-09-06: qty 1

## 2015-09-06 NOTE — ED Notes (Signed)
Pt was sleeping at the end of the lobby under several blankets. Pt was awoken by security and then security brought this RNs attention to pt. Pt now at nurses station stating that, "other people are sleeping, I feel like I am being picked on." Security educated pt that other people are not sleeping, they are laying down because they are not feeling well. Pt now asking "how much longer is it gonna be?" updated on wait time. Pt confrontational.

## 2015-09-06 NOTE — ED Notes (Signed)
Lidocaine and Incision and drainage tray at bedside.

## 2015-09-06 NOTE — ED Provider Notes (Signed)
CSN: [id number]     Arrival date & time 09/05/15  2114 History    By signing my name below, I, Forrestine Him, attest that this documentation has been prepared under the direction and in the presence of Orpah Greek, MD.  Electronically Signed: Forrestine Him, ED Scribe. 09/06/2015. 2:07 AM.   Chief Complaint  Patient presents with  . Leg Pain  . Abscess   The history is provided by the patient. No language interpreter was used.    HPI Comments: Jonathan Meadows is a 45 y.o. male with a PMHx of cellulitis and lymphedema who presents to the Emergency Department here for a possible abscess to the buttocks x 2-3 days. Pt reports worsening swelling and discomfort to area which is exacerbated with sitting. No OTC medications or home remedies attempted prior to arrival. No recent fever, chills, nausea, or vomiting. No known allergies to medications.  PCP: No PCP Per Patient    Past Medical History  Diagnosis Date  . Anemia   . Paranoid schizophrenia ([place])   . Cellulitis   . Homelessness   . Lymphedema of leg    History reviewed. No pertinent past surgical history. Family History  Problem Relation Age of Onset  . CAD      Neg HX  . Hypertension      Neg Hx  . Diabetes      Neg Hx  . Cancer      Neg Hx   Social History  Substance Use Topics  . Smoking status: Never Smoker   . Smokeless tobacco: Never Used  . Alcohol Use: No    Review of Systems  Constitutional: Negative for fever and chills.  Respiratory: Negative for shortness of breath.   Cardiovascular: Negative for chest pain.  Gastrointestinal: Negative for nausea, vomiting and abdominal pain.  Skin: Positive for wound.  Neurological: Negative for headaches.  Psychiatric/Behavioral: Negative for confusion.  All other systems reviewed and are negative.     Allergies  Review of patient's allergies indicates no known allergies.  Home Medications   Prior to Admission medications   Medication Sig Start Date End  Date Taking? Authorizing Provider  doxycycline (VIBRAMYCIN) 100 MG capsule Take 1 capsule (100 mg total) by mouth 2 (two) times daily. Patient not taking: Reported on 03/13/2015 01/25/15   Jarrett Soho Muthersbaugh, PA-C  furosemide (LASIX) 40 MG tablet Take 1 tablet (40 mg total) by mouth daily. 02/10/15   [name], MD  hydrochlorothiazide (HYDRODIURIL) 25 MG tablet Take 1 tablet (25 mg total) by mouth daily. 11/15/14   Dahlia Bailiff, PA-C  Lactobacillus (LACTINEX) PACK Mix a half of a packet with soft food and take twice per day for 5 days 03/14/15   Antonietta Breach, PA-C  naproxen (NAPROSYN) 500 MG tablet Take 1 tablet (500 mg total) by mouth 2 (two) times daily. 03/07/15   Tiffany Carlota Raspberry, PA-C  sulfamethoxazole-trimethoprim (BACTRIM DS,SEPTRA DS) 800-160 MG per tablet Take 1 tablet by mouth 2 (two) times daily. 02/27/15   Charlann Lange, PA-C   Triage Vitals: BP 119/63 mmHg  Pulse 76  Temp(Src) 98.6 F (37 C) (Oral)  Resp 16  SpO2 100%   Physical Exam  Constitutional: He is oriented to person, place, and time. He appears well-developed and well-nourished. No distress.  HENT:  Head: Normocephalic and atraumatic.  Right Ear: Hearing normal.  Left Ear: Hearing normal.  Nose: Nose normal.  Mouth/Throat: Oropharynx is clear and moist and mucous membranes are normal.  Eyes: Conjunctivae and EOM are  normal. Pupils are equal, round, and reactive to light.  Neck: Normal range of motion. Neck supple.  Cardiovascular: Regular rhythm, S1 normal and S2 normal.  Exam reveals no gallop and no friction rub.   No murmur heard. Pulmonary/Chest: Effort normal and breath sounds normal. No respiratory distress. He exhibits no tenderness.  Abdominal: Soft. Normal appearance and bowel sounds are normal. There is no hepatosplenomegaly. There is no tenderness. There is no rebound, no guarding, no tenderness at McBurney's point and negative Murphy's sign. No hernia.  Musculoskeletal: Normal range of motion.  Neurological: He  is alert and oriented to person, place, and time. He has normal strength. No cranial nerve deficit or sensory deficit. Coordination normal. GCS eye subscore is 4. GCS verbal subscore is 5. GCS motor subscore is 6.  Skin: Skin is warm, dry and intact. No rash noted. No cyanosis.  1 cm tender nodule to L upper gluteal fold No erythema or induration  Psychiatric: He has a normal mood and affect. His speech is normal and behavior is normal. Thought content normal.  Nursing note and vitals reviewed.   ED Course  Procedures (including critical care time)  DIAGNOSTIC STUDIES: Oxygen Saturation is 100% on RA, Normal by my interpretation.    COORDINATION OF CARE: 2:01 AM- Will perform I&D procedure. Discussed treatment plan with pt at bedside and pt agreed to plan.     INCISION AND DRAINAGE PROCEDURE NOTE: Patient identification was confirmed and verbal consent was obtained. This procedure was performed by Orpah Greek, MD at 2:06 AM. Site: gluteal fold Sterile procedures observed Needle size: 27 Anesthetic used (type and amt): 51ml Lido 2% Blade size: 11 Drainage: small Complexity: Complex Packing used: none Site anesthetized, incision made over site, wound drained and explored loculations, rinsed with copious amounts of normal saline, wound packed with sterile gauze, covered with dry, sterile dressing.  Pt tolerated procedure well without complications.  Instructions for care discussed verbally and pt provided with additional written instructions for homecare and f/u.   Labs Review Labs Reviewed - No data to display  Imaging Review No results found. I have personally reviewed and evaluated these images and lab results as part of my medical decision-making.   EKG Interpretation None      MDM   Final diagnoses:  None  abscess    Patient presents to the ER for evaluation of portal on his buttock. She had a tender subcutaneous nodule consistent with abscess in the  left gluteal area. This was drained. No packing placed it was not a large abscess. Will place empirically on antibiotics. Recommended warm soaks.  I personally performed the services described in this documentation, which was scribed in my presence. The recorded information has been reviewed and is accurate.   Orpah Greek, MD 09/06/15 [phone]

## 2015-09-06 NOTE — ED Notes (Signed)
Pt requesting pain medication and antibiotic, which were provided. Pt given bus ticket and sandwich.

## 2015-09-06 NOTE — Discharge Instructions (Signed)
Abscess °An abscess is an infected area that contains a collection of pus and debris. It can occur in almost any part of the body. An abscess is also known as a furuncle or boil. °CAUSES  °An abscess occurs when tissue gets infected. This can occur from blockage of oil or sweat glands, infection of hair follicles, or a minor injury to the skin. As the body tries to fight the infection, pus collects in the area and creates pressure under the skin. This pressure causes pain. People with weakened immune systems have difficulty fighting infections and get certain abscesses more often.  °SYMPTOMS °Usually an abscess develops on the skin and becomes a painful mass that is red, warm, and tender. If the abscess forms under the skin, you may feel a moveable soft area under the skin. Some abscesses break open (rupture) on their own, but most will continue to get worse without care. The infection can spread deeper into the body and eventually into the bloodstream, causing you to feel ill.  °DIAGNOSIS  °Your caregiver will take your medical history and perform a physical exam. A sample of fluid may also be taken from the abscess to determine what is causing your infection. °TREATMENT  °Your caregiver may prescribe antibiotic medicines to fight the infection. However, taking antibiotics alone usually does not cure an abscess. Your caregiver may need to make a small cut (incision) in the abscess to drain the pus. In some cases, gauze is packed into the abscess to reduce pain and to continue draining the area. °HOME CARE INSTRUCTIONS  °· Only take over-the-counter or prescription medicines for pain, discomfort, or fever as directed by your caregiver. °· If you were prescribed antibiotics, take them as directed. Finish them even if you start to feel better. °· If gauze is used, follow your caregiver's directions for changing the gauze. °· To avoid spreading the infection: °· Keep your draining abscess covered with a  bandage. °· Wash your hands well. °· Do not share personal care items, towels, or whirlpools with others. °· Avoid skin contact with others. °· Keep your skin and clothes clean around the abscess. °· Keep all follow-up appointments as directed by your caregiver. °SEEK MEDICAL CARE IF:  °· You have increased pain, swelling, redness, fluid drainage, or bleeding. °· You have muscle aches, chills, or a general ill feeling. °· You have a fever. °MAKE SURE YOU:  °· Understand these instructions. °· Will watch your condition. °· Will get help right away if you are not doing well or get worse. °  °This information is not intended to replace advice given to you by your health care provider. Make sure you discuss any questions you have with your health care provider. °  °Document Released: 03/26/2005 Document Revised: 12/16/2011 Document Reviewed: 08/29/2011 °Elsevier Interactive Patient Education ©2016 [employer] ° °Incision and Drainage °Incision and drainage is a procedure in which a sac-like structure (cystic structure) is opened and drained. The area to be drained usually contains material such as pus, fluid, or blood.  °LET YOUR CAREGIVER KNOW ABOUT:  °· Allergies to medicine. °· Medicines taken, including vitamins, herbs, eyedrops, over-the-counter medicines, and creams. °· Use of steroids (by mouth or creams). °· Previous problems with anesthetics or numbing medicines. °· History of bleeding problems or blood clots. °· Previous surgery. °· Other health problems, including diabetes and kidney problems. °· Possibility of pregnancy, if this applies. °RISKS AND COMPLICATIONS °· Pain. °· Bleeding. °· Scarring. °· Infection. °BEFORE THE PROCEDURE  °  You may need to have an ultrasound or other imaging tests to see how large or deep your cystic structure is. Blood tests may also be used to determine if you have an infection or how severe the infection is. You may need to have a tetanus shot. °PROCEDURE  °The affected area  is cleaned with a cleaning fluid. The cyst area will then be numbed with a medicine (local anesthetic). A small incision will be made in the cystic structure. A syringe or catheter may be used to drain the contents of the cystic structure, or the contents may be squeezed out. The area will then be flushed with a cleansing solution. After cleansing the area, it is often gently packed with a gauze or another wound dressing. Once it is packed, it will be covered with gauze and tape or some other type of wound dressing.  °AFTER THE PROCEDURE  °· Often, you will be allowed to go home right after the procedure. °· You may be given antibiotic medicine to prevent or heal an infection. °· If the area was packed with gauze or some other wound dressing, you will likely need to come back in 1 to 2 days to get it removed. °· The area should heal in about 14 days. °  °This information is not intended to replace advice given to you by your health care provider. Make sure you discuss any questions you have with your health care provider. °  °Document Released: 12/10/2000 Document Revised: 12/16/2011 Document Reviewed: 08/11/2011 °Elsevier Interactive Patient Education ©2016 [employer] ° °

## 2015-09-06 NOTE — ED Notes (Signed)
No answer for vital sign recheck. 

## 2015-09-07 ENCOUNTER — Encounter (HOSPITAL_COMMUNITY): Payer: Self-pay

## 2015-09-07 ENCOUNTER — Emergency Department (HOSPITAL_COMMUNITY)
Admission: EM | Admit: 2015-09-07 | Discharge: 2015-09-08 | Disposition: A | Discharge status: Home / Self Care | Payer: Self-pay | Attending: Emergency Medicine | Admitting: Emergency Medicine

## 2015-09-07 DIAGNOSIS — L03116 Cellulitis of left lower limb: Secondary | ICD-10-CM | POA: Insufficient documentation

## 2015-09-07 DIAGNOSIS — Z8659 Personal history of other mental and behavioral disorders: Secondary | ICD-10-CM | POA: Insufficient documentation

## 2015-09-07 DIAGNOSIS — Z590 Homelessness: Secondary | ICD-10-CM | POA: Insufficient documentation

## 2015-09-07 DIAGNOSIS — Z8679 Personal history of other diseases of the circulatory system: Secondary | ICD-10-CM | POA: Insufficient documentation

## 2015-09-07 DIAGNOSIS — Z79899 Other long term (current) drug therapy: Secondary | ICD-10-CM | POA: Insufficient documentation

## 2015-09-07 DIAGNOSIS — Z862 Personal history of diseases of the blood and blood-forming organs and certain disorders involving the immune mechanism: Secondary | ICD-10-CM | POA: Insufficient documentation

## 2015-09-07 DIAGNOSIS — J069 Acute upper respiratory infection, unspecified: Secondary | ICD-10-CM | POA: Insufficient documentation

## 2015-09-07 LAB — RAPID STREP SCREEN (MED CTR MEBANE ONLY): Streptococcus, Group A Screen (Direct): NEGATIVE

## 2015-09-07 NOTE — ED Notes (Signed)
Pt reports cough, sore throat and left thigh abscess. Pt A+OX4, speaking in complete sentences, and ambulatory.

## 2015-09-08 ENCOUNTER — Encounter (HOSPITAL_COMMUNITY): Payer: Self-pay | Admitting: Emergency Medicine

## 2015-09-08 MED ORDER — SULFAMETHOXAZOLE-TRIMETHOPRIM 800-160 MG PO TABS: 1.0000 | ORAL_TABLET | Freq: Two times a day (BID) | ORAL | Status: DC

## 2015-09-08 MED ORDER — NAPROXEN 375 MG PO TABS: 375.0000 mg | ORAL_TABLET | Freq: Two times a day (BID) | ORAL | Status: DC

## 2015-09-08 MED ORDER — IBUPROFEN 200 MG PO TABS
600.0000 mg | ORAL_TABLET | Freq: Once | ORAL | Status: AC
  Administered 2015-09-08: 600 mg via ORAL
  Filled 2015-09-08: qty 3

## 2015-09-08 MED ORDER — SULFAMETHOXAZOLE-TRIMETHOPRIM 800-160 MG PO TABS
1.0000 | ORAL_TABLET | Freq: Once | ORAL | Status: AC
  Administered 2015-09-08: 1 via ORAL
  Filled 2015-09-08: qty 1

## 2015-09-08 MED ORDER — NAPROXEN 375 MG PO TABS
375.0000 mg | ORAL_TABLET | Freq: Once | ORAL | Status: DC
  Filled 2015-09-08: qty 1

## 2015-09-08 NOTE — ED Notes (Signed)
Bed: [facility] Expected date:  Expected time:  Means of arrival:  Comments: 

## 2015-09-08 NOTE — ED Notes (Signed)
Bed: WA06 Expected date:  Expected time:  Means of arrival:  Comments: 

## 2015-09-08 NOTE — Discharge Instructions (Signed)
Cool Mist Vaporizers  Vaporizers may help relieve the symptoms of a cough and cold. They add moisture to the air, which helps mucus to become thinner and less sticky. This makes it easier to breathe and cough up secretions. Cool mist vaporizers do not cause serious burns like hot mist vaporizers, which may also be called steamers or humidifiers. Vaporizers have not been proven to help with colds. You should not use a vaporizer if you are allergic to mold.  HOME CARE INSTRUCTIONS  · Follow the package instructions for the vaporizer.  · Do not use anything other than distilled water in the vaporizer.  · Do not run the vaporizer all of the time. This can cause mold or bacteria to grow in the vaporizer.  · Clean the vaporizer after each time it is used.  · Clean and dry the vaporizer well before storing it.  · Stop using the vaporizer if worsening respiratory symptoms develop.     This information is not intended to replace advice given to you by your health care provider. Make sure you discuss any questions you have with your health care provider.     Document Released: 03/13/2004 Document Revised: 06/21/2013 Document Reviewed: 11/03/2012  Elsevier Interactive Patient Education ©2016 [employer]

## 2015-09-08 NOTE — ED Provider Notes (Signed)
CSN: [id number]     Arrival date & time 09/07/15  2308 History  By signing my name below, I, [name], attest that this documentation has been prepared under the direction and in the presence of Kamrie Fanton, MD. Electronically Signed: [name], [place]. 09/08/2015. 3:10 AM.   Chief Complaint  Patient presents with  . Cough  . Sore Throat  . Abscess   Patient is a 45 y.o. male presenting with cough, pharyngitis, and abscess. The history is provided by the patient. No language interpreter was used.  Cough Cough characteristics:  Non-productive Severity:  Mild Onset quality:  Sudden Duration:  3 days Timing:  Constant Progression:  Unchanged Chronicity:  New Context: not exposure to allergens   Relieved by:  Nothing Worsened by:  Nothing tried Ineffective treatments:  None tried Associated symptoms: sinus congestion and sore throat   Associated symptoms: no chest pain, no fever and no shortness of breath   Sore throat:    Severity:  Mild   Duration:  3 days   Timing:  Constant   Progression:  Unchanged Risk factors: chemical exposure   Sore Throat This is a new problem. The current episode started more than 2 days ago. The problem occurs constantly. The problem has not changed since onset.Pertinent negatives include no chest pain and no shortness of breath. Nothing aggravates the symptoms. Nothing relieves the symptoms.  Abscess Associated symptoms: no fever    HPI Comments: Jonathan Meadows is a 45 y.o. male who presents to the Emergency Department complaining of sudden onset, constant, mild cough and sore throat which began about three days ago . Pt also complains of a abscess on his left thigh which he states has been there for a few days.  Past Medical History  Diagnosis Date  . Anemia   . Paranoid schizophrenia ([place])   . Cellulitis   . Homelessness   . Lymphedema of leg    History reviewed. No pertinent past surgical history. Family History   Problem Relation Age of Onset  . CAD      Neg HX  . Hypertension      Neg Hx  . Diabetes      Neg Hx  . Cancer      Neg Hx   Social History  Substance Use Topics  . Smoking status: Never Smoker   . Smokeless tobacco: Never Used  . Alcohol Use: No    Review of Systems  Constitutional: Negative for fever.  HENT: Positive for congestion and sore throat.   Respiratory: Positive for cough. Negative for shortness of breath.   Cardiovascular: Negative for chest pain.  Skin:       abscess  All other systems reviewed and are negative.  Allergies  Review of patient's allergies indicates no known allergies.  Home Medications   Prior to Admission medications   Medication Sig Start Date End Date Taking? Authorizing Provider  furosemide (LASIX) 40 MG tablet Take 1 tablet (40 mg total) by mouth daily. 02/10/15   [name], MD  sulfamethoxazole-trimethoprim (BACTRIM DS,SEPTRA DS) 800-160 MG tablet Take 1 tablet by mouth 2 (two) times daily. 09/06/15 09/13/15  Orpah Greek, MD  traMADol (ULTRAM) 50 MG tablet Take 1 tablet (50 mg total) by mouth every 6 (six) hours as needed. 09/06/15   Orpah Greek, MD   BP 112/73 mmHg  Pulse 62  Temp(Src) 97.5 F (36.4 C) (Oral)  Resp 16  Ht 6\' 1"  (1.854 m)  Wt 200  lb (90.719 kg)  BMI 26.39 kg/m2  SpO2 100% Physical Exam  Constitutional: He is oriented to person, place, and time. He appears well-developed and well-nourished. No distress.  HENT:  Head: Normocephalic and atraumatic.  Mouth/Throat: Oropharynx is clear and moist. No oropharyngeal exudate.  Sore throat: No indication for further testing or treatment. Symptoms viral   Eyes: Conjunctivae and EOM are normal. Pupils are equal, round, and reactive to light.  conjunctiva normal  Neck: Normal range of motion. Neck supple.  No lymphadenopathy Indicated by [facility]   Cardiovascular: Normal rate, regular rhythm and intact distal pulses.   Pulmonary/Chest: Effort normal and  breath sounds normal. No stridor. No respiratory distress. He has no wheezes. He has no rales.  Lungs clear  Abdominal: Soft. Bowel sounds are normal. He exhibits no distension. There is no tenderness. There is no rebound and no guarding.  Musculoskeletal: Normal range of motion. He exhibits no edema.  Neurological: He is alert and oriented to person, place, and time.  Skin: Skin is warm and dry.  1 sonometer area of cellulitis medial left thigh Bedside ultrasound of abscess by me: Tissue inflammation, No abscess cavity at this time  Psychiatric: He has a normal mood and affect.  Nursing note and vitals reviewed.  ED Course  Procedures  DIAGNOSTIC STUDIES:    Oxygen Saturation is 100% on RA, normal by my interpretation.   COORDINATION OF CARE:  3:33 AM Will review labs. Will screen for possible sepsis.Discussed treatment plan with pt at bedside and pt agreed to plan.   Labs Review Labs Reviewed  RAPID STREP SCREEN (NOT AT [facility])  CULTURE, GROUP A STREP [facility])   I have personally reviewed and evaluated these images and lab results as part of my medical decision-making.  EMERGENCY DEPARTMENT US SOFT TISSUE INTERPRETATION No abscess   MDM   Final diagnoses:  None  Based on the centor criteria there is no indication for further testing or treatment  Will treat for cellulitis 1 cm of the thigh  With bactrim and naproxen.  Follow up with your PMD    I personally performed the services described in this documentation, which was scribed in my presence. The recorded information has been reviewed and is accurate.       Veatrice Kells, MD 09/08/15 [id number]

## 2015-09-08 NOTE — ED Notes (Signed)
Call to room x 1 w/o answer.

## 2015-09-09 ENCOUNTER — Encounter (HOSPITAL_COMMUNITY): Payer: Self-pay | Admitting: Family Medicine

## 2015-09-09 ENCOUNTER — Emergency Department (HOSPITAL_COMMUNITY)
Admission: EM | Admit: 2015-09-09 | Discharge: 2015-09-11 | Disposition: A | Discharge status: Other Acute Inpatient Hospital | Payer: Self-pay | Attending: Emergency Medicine | Admitting: Emergency Medicine

## 2015-09-09 DIAGNOSIS — R443 Hallucinations, unspecified: Secondary | ICD-10-CM | POA: Insufficient documentation

## 2015-09-09 DIAGNOSIS — Z792 Long term (current) use of antibiotics: Secondary | ICD-10-CM | POA: Insufficient documentation

## 2015-09-09 DIAGNOSIS — Z79899 Other long term (current) drug therapy: Secondary | ICD-10-CM | POA: Insufficient documentation

## 2015-09-09 DIAGNOSIS — Z590 Homelessness: Secondary | ICD-10-CM | POA: Insufficient documentation

## 2015-09-09 DIAGNOSIS — I890 Lymphedema, not elsewhere classified: Secondary | ICD-10-CM | POA: Insufficient documentation

## 2015-09-09 DIAGNOSIS — R059 Cough, unspecified: Secondary | ICD-10-CM

## 2015-09-09 DIAGNOSIS — D638 Anemia in other chronic diseases classified elsewhere: Secondary | ICD-10-CM | POA: Insufficient documentation

## 2015-09-09 DIAGNOSIS — Z791 Long term (current) use of non-steroidal anti-inflammatories (NSAID): Secondary | ICD-10-CM | POA: Insufficient documentation

## 2015-09-09 DIAGNOSIS — Z872 Personal history of diseases of the skin and subcutaneous tissue: Secondary | ICD-10-CM | POA: Insufficient documentation

## 2015-09-09 DIAGNOSIS — F419 Anxiety disorder, unspecified: Secondary | ICD-10-CM | POA: Insufficient documentation

## 2015-09-09 DIAGNOSIS — N179 Acute kidney failure, unspecified: Secondary | ICD-10-CM | POA: Insufficient documentation

## 2015-09-09 DIAGNOSIS — J3489 Other specified disorders of nose and nasal sinuses: Secondary | ICD-10-CM

## 2015-09-09 DIAGNOSIS — R05 Cough: Secondary | ICD-10-CM

## 2015-09-09 DIAGNOSIS — J069 Acute upper respiratory infection, unspecified: Secondary | ICD-10-CM | POA: Insufficient documentation

## 2015-09-09 DIAGNOSIS — D649 Anemia, unspecified: Secondary | ICD-10-CM

## 2015-09-09 LAB — COMPREHENSIVE METABOLIC PANEL
ALK PHOS: 55 U/L (ref 38–126)
ALT: 20 U/L (ref 17–63)
AST: 27 U/L (ref 15–41)
Albumin: 3.8 g/dL (ref 3.5–5.0)
Anion gap: 8 (ref 5–15)
BILIRUBIN TOTAL: 0.6 mg/dL (ref 0.3–1.2)
BUN: 11 mg/dL (ref 6–20)
CALCIUM: 9.3 mg/dL (ref 8.9–10.3)
CO2: 30 mmol/L (ref 22–32)
CREATININE: 1.4 mg/dL — AB (ref 0.61–1.24)
Chloride: 101 mmol/L (ref 101–111)
GFR, EST NON AFRICAN AMERICAN: 59 mL/min — AB (ref >60)
Glucose, Bld: 94 mg/dL (ref 65–99)
Potassium: 4.2 mmol/L (ref 3.5–5.1)
Sodium: 139 mmol/L (ref 135–145)
TOTAL PROTEIN: 7.3 g/dL (ref 6.5–8.1)

## 2015-09-09 LAB — CBC WITH DIFFERENTIAL/PLATELET
BASOS ABS: 0 10*3/uL (ref 0.0–0.1)
BASOS PCT: 1 %
EOS ABS: 0.1 10*3/uL (ref 0.0–0.7)
EOS PCT: 3 %
HCT: 38.8 % — ABNORMAL LOW (ref 39.0–52.0)
Hemoglobin: 12.8 g/dL — ABNORMAL LOW (ref 13.0–17.0)
LYMPHS ABS: 1.5 10*3/uL (ref 0.7–4.0)
Lymphocytes Relative: 38 %
MCH: 31.2 pg (ref 26.0–34.0)
MCHC: 33 g/dL (ref 30.0–36.0)
MCV: 94.6 fL (ref 78.0–100.0)
Monocytes Absolute: 0.5 10*3/uL (ref 0.1–1.0)
Monocytes Relative: 13 %
Neutro Abs: 1.8 10*3/uL (ref 1.7–7.7)
Neutrophils Relative %: 45 %
PLATELETS: 208 10*3/uL (ref 150–400)
RBC: 4.1 MIL/uL — AB (ref 4.22–5.81)
RDW: 13.6 % (ref 11.5–15.5)
WBC: 3.9 10*3/uL — AB (ref 4.0–10.5)

## 2015-09-09 LAB — RAPID URINE DRUG SCREEN, HOSP PERFORMED
AMPHETAMINES: NOT DETECTED
BENZODIAZEPINES: NOT DETECTED
Barbiturates: NOT DETECTED
COCAINE: NOT DETECTED
OPIATES: NOT DETECTED
Tetrahydrocannabinol: POSITIVE — AB

## 2015-09-09 LAB — SALICYLATE LEVEL: Salicylate Lvl: <4 mg/dL (ref 2.8–30.0)

## 2015-09-09 LAB — ACETAMINOPHEN LEVEL: Acetaminophen (Tylenol), Serum: <10 ug/mL — ABNORMAL LOW (ref 10–30)

## 2015-09-09 LAB — ETHANOL

## 2015-09-09 MED ORDER — ACETAMINOPHEN 325 MG PO TABS: 650.0000 mg | ORAL_TABLET | ORAL | Status: DC | PRN

## 2015-09-09 MED ORDER — ALUM & MAG HYDROXIDE-SIMETH 200-200-20 MG/5ML PO SUSP: 30.0000 mL | ORAL | Status: DC | PRN

## 2015-09-09 MED ORDER — LORAZEPAM 1 MG PO TABS
1.0000 mg | ORAL_TABLET | Freq: Three times a day (TID) | ORAL | Status: DC | PRN
  Administered 2015-09-09: 1 mg via ORAL
  Filled 2015-09-09 (×3): qty 1

## 2015-09-09 MED ORDER — FUROSEMIDE 20 MG PO TABS
40.0000 mg | ORAL_TABLET | Freq: Every day | ORAL | Status: DC
  Administered 2015-09-10 – 2015-09-11 (×2): 40 mg via ORAL
  Filled 2015-09-09 (×2): qty 2

## 2015-09-09 MED ORDER — SULFAMETHOXAZOLE-TRIMETHOPRIM 800-160 MG PO TABS
1.0000 | ORAL_TABLET | Freq: Two times a day (BID) | ORAL | Status: DC
  Administered 2015-09-09 – 2015-09-11 (×5): 1 via ORAL
  Filled 2015-09-09 (×5): qty 1

## 2015-09-09 MED ORDER — ZOLPIDEM TARTRATE 5 MG PO TABS
5.0000 mg | ORAL_TABLET | Freq: Every evening | ORAL | Status: DC | PRN
  Filled 2015-09-09: qty 1

## 2015-09-09 MED ORDER — ONDANSETRON HCL 4 MG PO TABS: 4.0000 mg | ORAL_TABLET | Freq: Three times a day (TID) | ORAL | Status: DC | PRN

## 2015-09-09 MED ORDER — IBUPROFEN 400 MG PO TABS
600.0000 mg | ORAL_TABLET | Freq: Three times a day (TID) | ORAL | Status: DC | PRN
  Administered 2015-09-09: 600 mg via ORAL
  Filled 2015-09-09: qty 3

## 2015-09-09 NOTE — ED Notes (Signed)
Extra pillow given to pt as requested.

## 2015-09-09 NOTE — BH Assessment (Addendum)
Tele Assessment Note   Jonathan Meadows is an 45 y.o. male. Pt reports passive SI/HI. Pt states he does not want to harm himself or others but voices have been telling him to harm himself and others. Pt states he had the voices in the past. Pt denies mental health diagnosis but there is history of Schizophrenia. Pt denies current mental health treatment. Pt was hospitalized in 2010 at [facility]. Pt denies family support. According to the Pt he is depressed due to homelessness. Pt states he feels he needs to be admitted because he does not want to harm himself or others. Pt states "I can't trust myself on the outside." Pt denies SA. Pt denies abuse.  Writer consulted with Ludger Nutting, NP. Per Ludger Nutting, NP Pt meets inpatient criteria. TTS to seek placement.  Diagnosis:  F33.3 MDD, with psychotic features  Past Medical History:  Past Medical History  Diagnosis Date  . Anemia   . Paranoid schizophrenia ([place])   . Cellulitis   . Homelessness   . Lymphedema of leg     History reviewed. No pertinent past surgical history.  Family History:  Family History  Problem Relation Age of Onset  . CAD      Neg HX  . Hypertension      Neg Hx  . Diabetes      Neg Hx  . Cancer      Neg Hx    Social History:  reports that he has never smoked. He has never used smokeless tobacco. He reports that he does not drink alcohol or use illicit drugs.  Additional Social History:  Alcohol / Drug Use Pain Medications: Pt denies Prescriptions: Pt denies Over the Counter: Pt denies History of alcohol / drug use?: No history of alcohol / drug abuse Longest period of sobriety (when/how long): NA  CIWA: CIWA-Ar BP: 120/82 mmHg Pulse Rate: (!) 56 COWS:    PATIENT STRENGTHS: (choose at least two) Average or above average intelligence Communication skills  Allergies: No Known Allergies  Home Medications:  (Not in a hospital admission)  OB/GYN Status:  No LMP for male patient.  General Assessment Data Location of  Assessment: [facility] ED TTS Assessment: In system Is this a Tele or Face-to-Face Assessment?: Tele Assessment Is this an Initial Assessment or a Re-assessment for this encounter?: Initial Assessment Marital status: Single Maiden name: NA Is patient pregnant?: No Pregnancy Status: No Living Arrangements: Other (Comment) (homeless) Can pt return to current living arrangement?: Yes Admission Status: Voluntary Is patient capable of signing voluntary admission?: Yes Referral Source: Self/Family/Friend Insurance type: SP     Crisis Care Plan Living Arrangements: Other (Comment) (homeless) Legal Guardian: Other: (self) Name of Psychiatrist: NA Name of Therapist: NA  Education Status Is patient currently in school?: No Current Grade: NA Highest grade of school patient has completed: 12 Name of school: NA Contact person: NA  Risk to self with the past 6 months Suicidal Ideation: No-Not Currently/Within Last 6 Months Has patient been a risk to self within the past 6 months prior to admission? : No Suicidal Intent: No Has patient had any suicidal intent within the past 6 months prior to admission? : No Is patient at risk for suicide?: Yes Suicidal Plan?: No Has patient had any suicidal plan within the past 6 months prior to admission? : No Access to Means: No What has been your use of drugs/alcohol within the last 12 months?: NA Previous Attempts/Gestures: No How many times?: 0 Other Self Harm Risks: NA Triggers  for Past Attempts: None known Intentional Self Injurious Behavior: None Family Suicide History: No Recent stressful life event(s): Other (Comment) (homeless) Persecutory voices/beliefs?: Yes Depression: Yes Depression Symptoms: Feeling worthless/self pity, Feeling angry/irritable Substance abuse history and/or treatment for substance abuse?: No Suicide prevention information given to non-admitted patients: Not applicable  Risk to Others within the past 6 months Homicidal  Ideation: No Does patient have any lifetime risk of violence toward others beyond the six months prior to admission? : No Thoughts of Harm to Others: No-Not Currently Present/Within Last 6 Months Current Homicidal Intent: No (Pt reports command hallucinations) Current Homicidal Plan: No Access to Homicidal Means: No Identified Victim: NA History of harm to others?: No Assessment of Violence: On admission Violent Behavior Description: NA Does patient have access to weapons?: No Criminal Charges Pending?: No Does patient have a court date: No Is patient on probation?: No  Psychosis Hallucinations: Auditory, With command Delusions: None noted  Mental Status Report Appearance/Hygiene: Unremarkable Eye Contact: Poor Motor Activity: Freedom of movement Speech: Logical/coherent Level of Consciousness: Alert Mood: Depressed, Sad Affect: Anxious, Irritable Anxiety Level: Moderate Thought Processes: Tangential, Flight of Ideas Judgement: Unimpaired Orientation: Person, Place, Time, Situation Obsessive Compulsive Thoughts/Behaviors: None  Cognitive Functioning Concentration: Normal Memory: Recent Intact, Remote Intact IQ: Average Insight: Poor Impulse Control: Poor Appetite: Fair Weight Loss: 0 Weight Gain: 0 Sleep: Decreased Total Hours of Sleep: 5 Vegetative Symptoms: None  ADLScreening [facility] Assessment Services) Patient's cognitive ability adequate to safely complete daily activities?: Yes Patient able to express need for assistance with ADLs?: Yes Independently performs ADLs?: Yes (appropriate for developmental age)  Prior Inpatient Therapy Prior Inpatient Therapy: Yes Prior Therapy Dates: 2010 Prior Therapy Facilty/Provider(s): [facility] Reason for Treatment: AVH  Prior Outpatient Therapy Prior Outpatient Therapy: No Prior Therapy Dates: NA Prior Therapy Facilty/Provider(s): NA Reason for Treatment: NA Does patient have an ACCT team?: No Does patient have Intensive  In-House Services?  : No Does patient have [facility] services? : No Does patient have P4CC services?: No  ADL Screening (condition at time of admission) Patient's cognitive ability adequate to safely complete daily activities?: Yes Is the patient deaf or have difficulty hearing?: No Does the patient have difficulty seeing, even when wearing glasses/contacts?: No Does the patient have difficulty concentrating, remembering, or making decisions?: No Patient able to express need for assistance with ADLs?: Yes Does the patient have difficulty dressing or bathing?: No Independently performs ADLs?: Yes (appropriate for developmental age) Does the patient have difficulty walking or climbing stairs?: No Weakness of Legs: None Weakness of Arms/Hands: None       Abuse/Neglect Assessment (Assessment to be complete while patient is alone) Physical Abuse: Denies Verbal Abuse: Denies Sexual Abuse: Denies Exploitation of patient/patient's resources: Denies Self-Neglect: Denies Values / Beliefs Cultural Requests During Hospitalization: None Spiritual Requests During Hospitalization: None   Advance Directives (For Healthcare) Does patient have an advance directive?: No Would patient like information on creating an advanced directive?: No - patient declined information    Additional Information 1:1 In Past 12 Months?: No CIRT Risk: No Elopement Risk: No Does patient have medical clearance?: Yes     Disposition: Seek inpatient treatment.     Jessup Ogas D 09/09/2015 10:14 AM

## 2015-09-09 NOTE — ED Notes (Signed)
Staffing Office aware pt now located in C25.

## 2015-09-09 NOTE — ED Notes (Signed)
Pt at nurses station upset about room assignment. Asking to be relocated. RN explained to pt purpose of room assignment. Pt returned to room.

## 2015-09-09 NOTE — Progress Notes (Signed)
Disposition CSW completed patient referrals to the following inpatient psych facilities:  [facility]  CSW will continue to follow patient for placement needs.  [facility] Disposition CSW [phone]

## 2015-09-09 NOTE — ED Notes (Signed)
Security to wand patient. Patient is paper scrubs. Belongings removed from the bedside.

## 2015-09-09 NOTE — ED Notes (Signed)
Sitter at bedside.

## 2015-09-09 NOTE — ED Notes (Signed)
Hassell Done, EMT at the bedside helping patient

## 2015-09-09 NOTE — ED Notes (Signed)
[place] Sandwich and Drink Provided.Marland KitchenMarland Kitchen

## 2015-09-09 NOTE — ED Notes (Signed)
Pt asking for ice chips - advised pt of [facility] C policy.

## 2015-09-09 NOTE — ED Notes (Signed)
Phlebotomy at the bedside  

## 2015-09-09 NOTE — ED Notes (Signed)
Pt asking for urinal - advised pt may ambulate to bathroom and showed pt where bathroom is located. Voiced understanding and ambulating there.

## 2015-09-09 NOTE — ED Provider Notes (Signed)
CSN: [id number]     Arrival date & time 09/09/15  0801 History   First MD Initiated Contact with Patient 09/09/15 [phone]     Chief Complaint  Patient presents with  . Cough  . Generalized Body Aches     (Consider location/radiation/quality/duration/timing/severity/associated sxs/prior Treatment) HPI Comments: Jonathan Meadows is a 45 y.o. male with a PMHx of anemia, paranoid schizophrenia, homelessness, recurrent cellulitis, and lymphedema of leg, who presents to the ED with multiple complaints. His initial complaint was cough with yellow sputum production, yellow rhinorrhea, body aches with chills 3-4 days, with no known aggravating or alleviating factors given the has not tried anything prior to arrival. He was seen twice in the last 3 days for similar complaints. Additionally reports that his left leg lymphedema is "acting up" but denies any discrete abscesses to me, although he was seen twice in the last 3 days for abscesses of the left leg and was prescribed Bactrim which he did not fill because he cannot afford them. Lastly he reports that he is having auditory and visual hallucinations stating that he is hearing voices telling him to hurt other people and telling him that people are trying to hurt him and make fun of him, as well as seeing shadows of people that aren't there. He denies any suicidal ideations or specific homicidal ideations, denies alcohol use, illicit drug use, or smoking. He is not on any psychiatric medications because he is homeless and can't afford anything. He states he feels anxious and like he's going to "have a breakdown", states he has outbursts of rage frequently. Here voluntarily but requesting help with his psychiatric illness/issues.  He denies fevers, ear pain/drainage, sore throat, drooling/trismus, wheezing, CP, SOB, abd pain, N/V/D/C, hematuria, dysuria, arthralgias, numbness, tingling, weakness, or rashes/skin changes.   Patient is a 45 y.o. male presenting with  cough. The history is provided by the patient and medical records. No language interpreter was used.  Cough Cough characteristics:  Productive Sputum characteristics:  Yellow Severity:  Mild Onset quality:  Gradual Duration:  4 days Timing:  Constant Progression:  Unchanged Chronicity:  New Smoker: no   Context: upper respiratory infection   Relieved by:  None tried Worsened by:  Nothing tried Ineffective treatments:  None tried Associated symptoms: chills, myalgias (L leg lymphedema and generalized body aches) and rhinorrhea   Associated symptoms: no chest pain, no ear pain, no fever, no shortness of breath, no sore throat and no wheezing     Past Medical History  Diagnosis Date  . Anemia   . Paranoid schizophrenia ([place])   . Cellulitis   . Homelessness   . Lymphedema of leg    History reviewed. No pertinent past surgical history. Family History  Problem Relation Age of Onset  . CAD      Neg HX  . Hypertension      Neg Hx  . Diabetes      Neg Hx  . Cancer      Neg Hx   Social History  Substance Use Topics  . Smoking status: Never Smoker   . Smokeless tobacco: Never Used  . Alcohol Use: No    Review of Systems  Constitutional: Positive for chills. Negative for fever.  HENT: Positive for rhinorrhea. Negative for drooling, ear discharge, ear pain and sore throat.   Respiratory: Positive for cough. Negative for shortness of breath and wheezing.   Cardiovascular: Negative for chest pain.  Gastrointestinal: Negative for nausea, vomiting, abdominal pain, diarrhea and constipation.  Genitourinary: Negative for dysuria and hematuria.  Musculoskeletal: Positive for myalgias (L leg lymphedema and generalized body aches). Negative for arthralgias.  Skin: Negative for color change.  Allergic/Immunologic: Negative for immunocompromised state.  Neurological: Negative for weakness and numbness.  Psychiatric/Behavioral: Positive for hallucinations. Negative for suicidal ideas  and confusion. The patient is nervous/anxious.    10 Systems reviewed and are negative for acute change except as noted in the HPI.    Allergies  Review of patient's allergies indicates no known allergies.  Home Medications   Prior to Admission medications   Medication Sig Start Date End Date Taking? Authorizing Provider  furosemide (LASIX) 40 MG tablet Take 1 tablet (40 mg total) by mouth daily. 02/10/15   [name], MD  naproxen (NAPROSYN) 375 MG tablet Take 1 tablet (375 mg total) by mouth 2 (two) times daily. 09/08/15   April Palumbo, MD  ondansetron (ZOFRAN-ODT) 4 MG disintegrating tablet Take 4 mg by mouth. 09/07/15 09/14/15  Historical Provider, MD  promethazine-dextromethorphan (PROMETHAZINE-DM) 6.25-15 MG/5ML syrup Take by mouth. 09/07/15 09/14/15  Historical Provider, MD  sulfamethoxazole-trimethoprim (BACTRIM DS,SEPTRA DS) 800-160 MG tablet Take 1 tablet by mouth 2 (two) times daily. 09/06/15 09/13/15  Orpah Greek, MD  sulfamethoxazole-trimethoprim (BACTRIM DS,SEPTRA DS) 800-160 MG tablet Take 1 tablet by mouth 2 (two) times daily. 09/08/15 09/15/15  April Palumbo, MD  traMADol (ULTRAM) 50 MG tablet Take 1 tablet (50 mg total) by mouth every 6 (six) hours as needed. 09/06/15   Orpah Greek, MD   BP 120/82 mmHg  Pulse 56  Temp(Src) 97.6 F (36.4 C) (Oral)  Resp 16  SpO2 100% Physical Exam  Constitutional: He is oriented to person, place, and time. Vital signs are normal. He appears well-developed and well-nourished.  Non-toxic appearance. No distress.  Afebrile, nontoxic, NAD  HENT:  Head: Normocephalic and atraumatic.  Nose: Mucosal edema and rhinorrhea present.  Mouth/Throat: Uvula is midline, oropharynx is clear and moist and mucous membranes are normal. No trismus in the jaw. No uvula swelling.  Mild mucosal edema with mild rhinorrhea. Oropharynx clear and moist, without uvular swelling or deviation, no trismus or drooling, no tonsillar swelling or erythema, no  exudates.    Eyes: Conjunctivae and EOM are normal. Right eye exhibits no discharge. Left eye exhibits no discharge.  Neck: Normal range of motion. Neck supple.  Cardiovascular: Normal rate, regular rhythm, normal heart sounds and intact distal pulses.  Exam reveals no gallop and no friction rub.   No murmur heard. Pulmonary/Chest: Effort normal and breath sounds normal. No respiratory distress. He has no decreased breath sounds. He has no wheezes. He has no rhonchi. He has no rales.  CTAB in all lung fields, no w/r/r, no hypoxia or increased WOB, speaking in full sentences, SpO2 100% on RA   Abdominal: Soft. Normal appearance and bowel sounds are normal. He exhibits no distension. There is no tenderness. There is no rigidity, no rebound, no guarding, no CVA tenderness, no tenderness at McBurney's point and negative Murphy's sign.  Musculoskeletal: Normal range of motion.  L leg with hardened lymphedematous skin without red streaking or warmth over exposed surfaces MAE x4 Strength and sensation grossly intact Distal pulses intact  Neurological: He is alert and oriented to person, place, and time. He has normal strength. No sensory deficit.  Skin: Skin is warm, dry and intact. No rash noted.  Psychiatric: His mood appears anxious. His speech is tangential. He is actively hallucinating. He expresses no homicidal and no suicidal ideation.  He expresses no suicidal plans and no homicidal plans.  Somewhat anxious appearing, some tangential speech but redirectable. Endorsing hallucinations both auditory and visual. Denies HI/SI  Nursing note and vitals reviewed.   ED Course  Procedures (including critical care time) Labs Review Labs Reviewed  COMPREHENSIVE METABOLIC PANEL - Abnormal; Notable for the following:    Creatinine, Ser 1.40 (*)    GFR calc non Af Amer 59 (*)    All other components within normal limits  CBC WITH DIFFERENTIAL/PLATELET - Abnormal; Notable for the following:    WBC 3.9  (*)    RBC 4.10 (*)    Hemoglobin 12.8 (*)    HCT 38.8 (*)    All other components within normal limits  ACETAMINOPHEN LEVEL - Abnormal; Notable for the following:    Acetaminophen (Tylenol), Serum <10 (*)    All other components within normal limits  ETHANOL  SALICYLATE LEVEL  URINE RAPID DRUG SCREEN, HOSP PERFORMED    Imaging Review No results found. I have personally reviewed and evaluated these images and lab results as part of my medical decision-making.   EKG Interpretation None      MDM   Final diagnoses:  Cough  URI (upper respiratory infection)  Rhinorrhea  Hallucinations  Anxiety  Lymphedema of left leg  Chronic anemia  AKI (acute kidney injury) ([place])    45 y.o. male here with multiple complaints. Primarily he has cough and URI symptoms that have been ongoing x3-4 days, was seen twice in the last 3 days for these symptoms, had neg RST yesterday, no other imaging/labs done at that time. Secondarily he is complaining of AVH with voices telling him to hurt other people and that other people are out to get him, but no discrete SI/HI. Additionally he states his lymphedema of his leg is acting up, denies any abscess to me but was seen for abscess twice in the last 3 days and was prescribed bactrim which he did not fill. Given that he's had issues with this before, will restart this here while he's with [place]. Overall, URI symptoms likely viral, clear lung exam, doubt need for imaging. L leg with lymphedema but no erythema or red streaking over exposed surfaces, doubt acute issue requiring any further work up, will restart bactrim while he's here. For psych issues, he's here voluntarily, but if he tried to leave we would need to IVC him due to my concerns that he's a threat to himself. Will get screening labs and then TTS consult.   10:19 AM Labs returning showing mildly bumped Cr 1.4 which is similar to prior, will encourage PO fluids while he's here. EtOH neg. CBC w/diff  showing baseline anemia. Salicylate and tylenol levels WNL. UDS pending but this will not hold up his med clearance, pt medically cleared at this time, please see [facility] notes for further documentation of care. Bactrim ordered for while he's here, since this was prescribed to him on two visits this week but he never filled it. Pt stable at this time. Please see [facility] notes for ongoing care/dispo. Holding orders placed.  BP 120/82 mmHg  Pulse 56  Temp(Src) 97.6 F (36.4 C) (Oral)  Resp 16  SpO2 100%  Meds ordered this encounter  Medications  . alum & mag hydroxide-simeth (MAALOX/MYLANTA) 200-200-20 MG/5ML suspension 30 mL    Sig:   . ondansetron (ZOFRAN) tablet 4 mg    Sig:   . zolpidem (AMBIEN) tablet 5 mg    Sig:   . ibuprofen (  ADVIL,MOTRIN) tablet 600 mg    Sig:   . acetaminophen (TYLENOL) tablet 650 mg    Sig:   . LORazepam (ATIVAN) tablet 1 mg    Sig:   . sulfamethoxazole-trimethoprim (BACTRIM DS,SEPTRA DS) 800-160 MG per tablet 1 tablet    Sig:      Maribell Demeo Camprubi-Soms, PA-C 09/09/15 1021  Lacretia Leigh, MD 09/09/15 [phone]

## 2015-09-09 NOTE — ED Notes (Signed)
Meal Tray ordered for patient.

## 2015-09-09 NOTE — ED Notes (Addendum)
Pt refused to take jacket off in order to get vitals in triage

## 2015-09-09 NOTE — ED Notes (Signed)
Pt here for cough, body aches and pain for a few days.

## 2015-09-09 NOTE — ED Notes (Addendum)
Pt ambulatory to Pod C 25 w/RN and sitter. Pt asked for w/c - asked pt if he has difficulty walking - stated no - states "I have all of these things in my hands" -  Pt has 2 journals, 2 books, pen and 2 juices w/him. RN offered to carry for pt. Pt decided he would carry himself. Upon arrival to Pod C, pt advised needs to put one book w/his belongings. Security assisting w/explaining.

## 2015-09-09 NOTE — ED Notes (Signed)
Called Staffing to allow them to know patient needs of sitter.

## 2015-09-09 NOTE — ED Notes (Signed)
TTS at the bedside. Counselor speaking with patient

## 2015-09-09 NOTE — ED Notes (Signed)
Pt asking for urinal when exited bathroom - advised pt no. Voiced understanding and returned to room.

## 2015-09-09 NOTE — ED Notes (Signed)
Hope from [facility] - called and advised did not receive pt's UDS - requested for copy to be faxed - [phone] - done.

## 2015-09-10 LAB — CULTURE, GROUP A STREP (THRC)

## 2015-09-10 NOTE — ED Provider Notes (Signed)
Resting in bed comfortably. Denies complaint  Jonathan Dakin, MD 09/10/15 0900

## 2015-09-10 NOTE — ED Notes (Signed)
Call received from [facility] regarding placement.  Patient denied due to malingering.

## 2015-09-10 NOTE — ED Notes (Signed)
The patient don't eat pork. 2 [place] sausages and eggs was called in for breakfast, at pt's request.

## 2015-09-10 NOTE — ED Notes (Signed)
A snack and drink given to patient, and a regular diet ordered for lunch. The patient stated he don't eat pork.

## 2015-09-11 ENCOUNTER — Observation Stay (HOSPITAL_COMMUNITY)
Admission: AD | Admit: 2015-09-11 | Discharge: 2015-09-12 | Disposition: A | Discharge status: Home / Self Care | Payer: Self-pay | Source: Intra-hospital | Attending: Psychiatry | Admitting: Psychiatry

## 2015-09-11 ENCOUNTER — Encounter (HOSPITAL_COMMUNITY): Payer: Self-pay

## 2015-09-11 DIAGNOSIS — F251 Schizoaffective disorder, depressive type: Principal | ICD-10-CM | POA: Insufficient documentation

## 2015-09-11 DIAGNOSIS — F410 Panic disorder [episodic paroxysmal anxiety] without agoraphobia: Secondary | ICD-10-CM | POA: Insufficient documentation

## 2015-09-11 DIAGNOSIS — Z590 Homelessness: Secondary | ICD-10-CM | POA: Insufficient documentation

## 2015-09-11 DIAGNOSIS — F411 Generalized anxiety disorder: Secondary | ICD-10-CM | POA: Insufficient documentation

## 2015-09-11 DIAGNOSIS — F209 Schizophrenia, unspecified: Secondary | ICD-10-CM | POA: Diagnosis present

## 2015-09-11 MED ORDER — LORAZEPAM 1 MG PO TABS: 1.0000 mg | ORAL_TABLET | ORAL | Status: DC | PRN

## 2015-09-11 MED ORDER — MAGNESIUM HYDROXIDE 400 MG/5ML PO SUSP: 30.0000 mL | Freq: Every day | ORAL | Status: DC | PRN

## 2015-09-11 MED ORDER — RISPERIDONE 2 MG PO TBDP: 2.0000 mg | ORAL_TABLET | Freq: Three times a day (TID) | ORAL | Status: DC | PRN

## 2015-09-11 MED ORDER — SULFAMETHOXAZOLE-TRIMETHOPRIM 800-160 MG PO TABS
1.0000 | ORAL_TABLET | Freq: Two times a day (BID) | ORAL | Status: DC
  Administered 2015-09-11 – 2015-09-12 (×2): 1 via ORAL
  Filled 2015-09-11 (×2): qty 1

## 2015-09-11 MED ORDER — ZIPRASIDONE MESYLATE 20 MG IM SOLR: 20.0000 mg | INTRAMUSCULAR | Status: DC | PRN

## 2015-09-11 MED ORDER — ALUM & MAG HYDROXIDE-SIMETH 200-200-20 MG/5ML PO SUSP: 30.0000 mL | ORAL | Status: DC | PRN

## 2015-09-11 MED ORDER — QUETIAPINE FUMARATE 50 MG PO TABS: 50.0000 mg | ORAL_TABLET | Freq: Every evening | ORAL | Status: DC | PRN

## 2015-09-11 MED ORDER — ACETAMINOPHEN 325 MG PO TABS: 650.0000 mg | ORAL_TABLET | Freq: Four times a day (QID) | ORAL | Status: DC | PRN

## 2015-09-11 NOTE — B[facility] Counselor (Signed)
This Probation officer spoke with pt at bedside to discuss reason for admission. Pt states that he is depressed due to being homeless and he began to experience SI/HI. Pt states that he doesn't want to hurt anyone or himself. Pt denies any SI/HI at this time. Pt states that he currently does not have a mental health provider and wants to be linked with a provider upon discharge. Pt has a current diagnosis of Paranoid Schizophrenia and does not take any medications for it. This Probation officer spoke with Jonathan Meadows to discuss some possible recommendations for this pt. Pt would greatly benefit from being admitted to inpatient services to combat his mental health diagnosis and other underlying issues that he is dealing with. Pt would also benefit from following up with OPT services  If he is not admitted to inpatient services. Pt discussed that he is willing to try [facility] or another provider in an attempt to get the help that he needs. This Probation officer will also provide pt with a list of resources (shelters,OPT providers, etc). This Probation officer will continue to provide pt with encouragement and support.  Jonathan Pulling, MA OBS Counselor

## 2015-09-11 NOTE — ED Notes (Signed)
Pt aware being accepted to [facility]. Pt signed consent forms.

## 2015-09-11 NOTE — ED Provider Notes (Addendum)
PT s/p recent I&D of buttocks abscess.  Feels that it has enlarged.  Pt has small, <1cm nodule to left buttocks, adjacent to crease.  No fluctuance, no signs of infection. Patient is on Bactrim currently. I advised him just to keep an eye on it. It does not appear to need any intervention currently.  Pt has been accepted to [facility] by Dr. Ester Rink, MD 09/11/15 [place], MD 09/11/15 [id number]
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
## 2015-09-11 NOTE — Progress Notes (Signed)
Jonathan Meadows 45 y/o AAM admitted to Observation unit from CED with DX: MDD with Psychotic Features, by this writer at 20:18.  Pt  Admitted to Ed two days ago with increasing paranoia, delusional thoughts, and command auditory hallucinations. A/H tells Pt that people were trying to harm him and that they were reading his thoughts.  Pt has increased worrying and agitation leading to panic attacks. Pt is aggressive and verbally demanding as well as suspicious and very proud.  Pt initially refuses skin search, complied after advising him he was voluntary admit and could gather his property and leave.  Pt has boil in butt crack that is healing with use of PO antibiotics.  Pt has scabbed over boil on left inner thigh.  Pt also has swollen rt leg at the area between the calf and knee. Pt has hx of cellulitis and lymphedema, as well as hx for paranoid schizophrenia.  Pt on unit has showered and had meal as well as Gatorade x 2 as fluid replacement to get bp up.  Bp on admission was borderline at 105/63 with NP notified.  Pt in bed 4 and continuously observed for safety except when in bathroom.

## 2015-09-11 NOTE — H&P (Signed)
Psychiatric Admission Assessment Adult  Patient Identification: Jonathan Meadows MRN:  [id number] Date of Evaluation:  09/11/2015 Chief Complaint:  MDD WITH PSYCHOTIC FEATURES Principal Diagnosis: Chronic Schizophrenia with acute exacerbation, GAD, Panic attacks without agoraphobia  Patient Active Problem List   Diagnosis Date Noted  . Schizophrenia, chronic condition with acute exacerbation ([place]) [F20.9] 09/11/2015  . GAD (generalized anxiety disorder) [F41.1]   . Cellulitis and abscess of finger [L03.019, L02.519] 10/10/2012  . Edema of right lower extremity [R60.0] 10/10/2012  . Anemia [D64.9] 10/10/2012   History of Present Illness: Patient is a 45 y/o AAM with hx of Schizophrenia, presenting to the [facility] ED 2 days ago with increasing paranoia, delusional thoughts and command AH over the past few days. The patient denies seeing a [place] provider and or taking psychotropics on a regular basis as an out-patient. The AH were telling him that people are out to get him and that people are reading his thoughts. The paranoia, delusional thoughts were causing him to worry and having increased agitation and anxiety culminating into panic attacks. He is denying any SI/SA or HI at this present time but has a depressed mood due to his current homelessness. Associated Signs/Symptoms: Depression Symptoms:  depressed mood, anhedonia, feelings of worthlessness/guilt, (Hypo) Manic Symptoms:  Delusions, Hallucinations, Anxiety Symptoms:  Excessive Worry, Panic Symptoms, Psychotic Symptoms:  Hallucinations: Auditory PTSD Symptoms: Negative Total Time spent with patient: 30 minutes  Past Psychiatric History:Paranoid Schizophrenia  Is the patient at risk to self? Yes.    Has the patient been a risk to self in the past 6 months? Yes.    Has the patient been a risk to self within the distant past? Yes.    Is the patient a risk to others? Yes.    Has the patient been a risk to others in the past 6 months? Yes.     Has the patient been a risk to others within the distant past? Yes.     Prior Inpatient Therapy:  yes Prior Outpatient Therapy:  no  Alcohol Screening:   Substance Abuse History in the last 12 months:  No. Consequences of Substance Abuse: Negative Previous Psychotropic Medications: Yes  Psychological Evaluations: Yes  Past Medical History:  Past Medical History  Diagnosis Date  . Anemia   . Paranoid schizophrenia ([place])   . Cellulitis   . Homelessness   . Lymphedema of leg    No past surgical history on file. Family History:  Family History  Problem Relation Age of Onset  . CAD      Neg HX  . Hypertension      Neg Hx  . Diabetes      Neg Hx  . Cancer      Neg Hx   Family Psychiatric  History: Denies Tobacco Screening: Denies Social History:  History  Alcohol Use No     History  Drug Use No    Additional Social History:                           Allergies:  No Known Allergies Lab Results: No results found for this or any previous visit (from the past 48 hour(s)).  Blood Alcohol level:  Lab Results  Component Value Date   ETH <5 09/09/2015   [facility]  03/24/2009    <5        LOWEST DETECTABLE LIMIT FOR SERUM ALCOHOL IS 5 mg/dL FOR MEDICAL PURPOSES ONLY    Metabolic Disorder  Labs:  No results found for: HGBA1C, MPG No results found for: PROLACTIN No results found for: CHOL, TRIG, HDL, CHOLHDL, VLDL, LDLCALC  Current Medications: Current Facility-Administered Medications  Medication Dose Route Frequency Provider Last Rate Last Dose  . acetaminophen (TYLENOL) tablet 650 mg  650 mg Oral Q6H PRN Laverle Hobby, PA-C      . alum & mag hydroxide-simeth (MAALOX/MYLANTA) 200-200-20 MG/5ML suspension 30 mL  30 mL Oral Q4H PRN Laverle Hobby, PA-C      . risperiDONE (RISPERDAL M-TABS) disintegrating tablet 2 mg  2 mg Oral Q8H PRN Laverle Hobby, PA-C       And  . LORazepam (ATIVAN) tablet 1 mg  1 mg Oral PRN Laverle Hobby, PA-C       And  .  ziprasidone (GEODON) injection 20 mg  20 mg Intramuscular PRN Laverle Hobby, PA-C      . magnesium hydroxide (MILK OF MAGNESIA) suspension 30 mL  30 mL Oral Daily PRN Laverle Hobby, PA-C      . QUEtiapine (SEROQUEL) tablet 50 mg  50 mg Oral QHS,MR X 1 Spencer E Simon, PA-C      . sulfamethoxazole-trimethoprim (BACTRIM DS,SEPTRA DS) 800-160 MG per tablet 1 tablet  1 tablet Oral BID Laverle Hobby, PA-C       PTA Medications: Prescriptions prior to admission  Medication Sig Dispense Refill Last Dose  . furosemide (LASIX) 40 MG tablet Take 1 tablet (40 mg total) by mouth daily. 5 tablet 0 09/09/2015 at Unknown time  . naproxen (NAPROSYN) 375 MG tablet Take 1 tablet (375 mg total) by mouth 2 (two) times daily. (Patient not taking: Reported on 09/09/2015) 20 tablet 0 Not Taking at Unknown time  . ondansetron (ZOFRAN-ODT) 4 MG disintegrating tablet Take 4 mg by mouth every 8 (eight) hours as needed for nausea or vomiting. Reported on 09/09/2015   Not Taking at Unknown time  . sulfamethoxazole-trimethoprim (BACTRIM DS,SEPTRA DS) 800-160 MG tablet Take 1 tablet by mouth 2 (two) times daily. 20 tablet 0 09/09/2015 at Unknown time    Musculoskeletal: Strength & Muscle Tone: within normal limits Gait & Station: normal Patient leans: N/A  Psychiatric Specialty Exam: Physical Exam  Nursing note and vitals reviewed. Constitutional: He is oriented to person, place, and time. He appears well-developed and well-nourished. No distress.  HENT:  Head: Normocephalic.  Eyes: Pupils are equal, round, and reactive to light.  Neurological: He is alert and oriented to person, place, and time. No cranial nerve deficit.  Skin: Skin is warm and dry. He is not diaphoretic.    Review of Systems  Psychiatric/Behavioral: Positive for depression and hallucinations. Negative for suicidal ideas and substance abuse. The patient is nervous/anxious and has insomnia.   All other systems reviewed and are negative.   There  were no vitals taken for this visit.There is no weight on file to calculate BMI.  General Appearance: Disheveled  Eye Contact::  Poor  Speech:  Slow  Volume:  Decreased  Mood:  Anxious and Irritable  Affect:  Congruent  Thought Process:  Circumstantial  Orientation:  Full (Time, Place, and Person)  Thought Content:  Delusions and Hallucinations: Auditory  Suicidal Thoughts:  No  Homicidal Thoughts:  No  Memory:  Recent;   Fair  Judgement:  Poor  Insight:  Lacking  Psychomotor Activity:  Negative  Concentration:  Fair  Recall:  [employer] of Knowledge:Poor  Language: Fair  Akathisia:  Negative  Handed:  Right  AIMS (if indicated):     Assets:  Desire for Improvement  ADL's:  Intact  Cognition: WNL  Sleep:        Treatment Plan Summary: Plan Patient admitted to observation unit for crises intervention, safety and stabilization  Observation Level/Precautions:  Continuous Observation  Laboratory:    Psychotherapy:    Medications:    Consultations:    Discharge Concerns:    Estimated LOS: 24 - 48 hours  Other:        Rennie Plowman 3/14/20179:23 PM Patient admitted to [facility] OBS unit for stabilization and treatment

## 2015-09-11 NOTE — ED Notes (Signed)
Cup of ice given as requested.

## 2015-09-11 NOTE — ED Notes (Signed)
A snack and drink given to patient,and a regular diet ordered for lunch.

## 2015-09-11 NOTE — ED Notes (Signed)
Pt given cups of ice as requested. Pt requesting new order of fries and ketchup states d/t the ones delivered are cold. Left message w/service response.

## 2015-09-11 NOTE — ED Notes (Signed)
Dr Tamera Punt in w/pt - per pt request to re-assess abscess on buttock.

## 2015-09-12 DIAGNOSIS — F209 Schizophrenia, unspecified: Secondary | ICD-10-CM

## 2015-09-12 DIAGNOSIS — F411 Generalized anxiety disorder: Secondary | ICD-10-CM

## 2015-09-12 MED ORDER — QUETIAPINE FUMARATE 50 MG PO TABS: 50.0000 mg | ORAL_TABLET | Freq: Two times a day (BID) | ORAL | Status: DC

## 2015-09-12 MED ORDER — FUROSEMIDE 20 MG PO TABS
40.0000 mg | ORAL_TABLET | Freq: Every day | ORAL | Status: DC
  Administered 2015-09-12: 40 mg via ORAL

## 2015-09-12 MED ORDER — SULFAMETHOXAZOLE-TRIMETHOPRIM 800-160 MG PO TABS: 1.0000 | ORAL_TABLET | Freq: Two times a day (BID) | ORAL | Status: DC

## 2015-09-12 MED ORDER — FUROSEMIDE 20 MG PO TABS
ORAL_TABLET | ORAL | Status: AC
  Filled 2015-09-12: qty 2

## 2015-09-12 NOTE — Progress Notes (Signed)
Nursing Shift Note:  Patient awake, in the bathroom several times, has shaved (under visual observation entire time and the two razors used placed in sharps when patient finished), showered, been given new set of scrubs, asking for "a dryer", angry and insulting "somebody, i mean common sense would tell you" regarding the fact that he has no Lasix currently ordered. Nurse calmly telling patient to be sure and let the Provider know when he or she comes in this morning. Nurse giving patient his Bactrim scheduled for 0800 and patient immediately spitting it out into his hand when he started to drink the water Nurse had given him to drink, saying "I can't drink that water! That water's hot. I cant be taking that pill with no hot water like that!". Counselor on Unit getting ice from nearby Unit and putting in patient's plastic pitcher with water and then patient taking the pill with Nurse ensuring he swallowed the pill sufficiently. Patient admits to "occasionally" having some visual hallucinations and more frequently audio hallucinations but denies having either at present time and also denies any SI or HI and agrees to contract for safety in that he agrees to alert staff first should he develop any thoughts of harming himself. Nurse ensuring continuous observation except when patient in bathroom. Patient remains safe on Unit.

## 2015-09-12 NOTE — Progress Notes (Signed)
Nursing Discharge Note:  Patient taking the ordered 40 mg Lasix po, belongings returned, patient given bus pass to discharge. Patient had exhibited extreme anger and unwilling to sign any forms.

## 2015-09-12 NOTE — Discharge Instructions (Signed)
Patient was discharged this date and was not interested in any follow up resources. Patient was encouraged to follow up with [facility] but declined to take any information with him on discharge.

## 2015-09-12 NOTE — BH Assessment (Signed)
[place] Assessment Progress Note  Patient became frustrated that his discharge was taking to long and ask to leave AMA. This Probation officer spoke with [employer] DNP who immediately entered his discharge due to patient becoming agitated. Patient refused to sign his AVS and was discharged without incident. This Probation officer did encourage patient to follow up with outpatient services [facility] although patient refused information.

## 2015-09-12 NOTE — Discharge Summary (Signed)
[facility] OBS UNIT DISCHARGE SUMMARY  Patient Identification: Jonathan Meadows MRN:  [id number] Date of Evaluation:  09/12/2015 Chief Complaint:  Schizoaffective-depressed Principal Diagnosis: Schizophrenia, chronic condition with acute exacerbation [facility])  Patient Active Problem List   Diagnosis Date Noted  . Schizophrenia, chronic condition with acute exacerbation ([place]) [F20.9] 09/11/2015    Priority: High  . GAD (generalized anxiety disorder) [F41.1]     Priority: High  . Cellulitis and abscess of finger [L03.019, L02.519] 10/10/2012  . Edema of right lower extremity [R60.0] 10/10/2012  . Anemia [D64.9] 10/10/2012   Subjective: Pt seen and chart reviewed. Pt is alert/oriented x4, calm, cooperative, and appropriate to situation. Pt denies suicidal/homicidal ideation and psychosis and does not appear to be responding to internal stimuli. Pt reports that he feels better now that his medication has been restarted and that he would like to leave as soon as possible. Pt informed that he will go within the next 1.5 hours before 1pm. Pt became agitated with staff demanding to leave at 12:15PM while we were actually doing his discharge. Pt was going to sign AMA but convinced to stay and wait for his scripts for Seroquel and Bactrim.   History of Present Illness: Patient is a 45 y/o AAM with hx of Schizophrenia, presenting to the [facility] ED 2 days ago with increasing paranoia, delusional thoughts and command AH over the past few days. The patient denies seeing a [facility] provider and or taking psychotropics on a regular basis as an out-patient. The AH were telling him that people are out to get him and that people are reading his thoughts. The paranoia, delusional thoughts were causing him to worry and having increased agitation and anxiety culminating into panic attacks. He is denying any SI/SA or HI at this present time but has a depressed mood due to his current homelessness.   Pt spent the night in the [facility] OBS Unit  without incident.  Total Time spent with patient: 30 minutes  Past Psychiatric History:Paranoid Schizophrenia  Is the patient at risk to self? Yes.    Has the patient been a risk to self in the past 6 months? Yes.    Has the patient been a risk to self within the distant past? Yes.    Is the patient a risk to others? Yes.    Has the patient been a risk to others in the past 6 months? Yes.    Has the patient been a risk to others within the distant past? Yes.     Prior Inpatient Therapy:  yes Prior Outpatient Therapy:  no  Alcohol Screening: 1. How often do you have a drink containing alcohol?: Never Substance Abuse History in the last 12 months:  No. Consequences of Substance Abuse: Negative Previous Psychotropic Medications: Yes  Psychological Evaluations: Yes  Past Medical History:  Past Medical History  Diagnosis Date  . Anemia   . Paranoid schizophrenia ([place])   . Cellulitis   . Homelessness   . Lymphedema of leg    History reviewed. No pertinent past surgical history. Family History:  Family History  Problem Relation Age of Onset  . CAD      Neg HX  . Hypertension      Neg Hx  . Diabetes      Neg Hx  . Cancer      Neg Hx   Family Psychiatric  History: Denies Tobacco Screening: Denies Social History:  History  Alcohol Use No     History  Drug Use  No    Additional Social History:      Pain Medications: Pt denies Prescriptions: Pt denies Over the Counter: Pt denies History of alcohol / drug use?: No history of alcohol / drug abuse Longest period of sobriety (when/how long): NA                    Allergies:  No Known Allergies Lab Results: No results found for this or any previous visit (from the past 48 hour(s)).  Blood Alcohol level:  Lab Results  Component Value Date   ETH <5 09/09/2015   [facility]  03/24/2009    <5        LOWEST DETECTABLE LIMIT FOR SERUM ALCOHOL IS 5 mg/dL FOR MEDICAL PURPOSES ONLY    Metabolic Disorder Labs:  No  results found for: HGBA1C, MPG No results found for: PROLACTIN No results found for: CHOL, TRIG, HDL, CHOLHDL, VLDL, LDLCALC  Current Medications: Current Facility-Administered Medications  Medication Dose Route Frequency Provider Last Rate Last Dose  . acetaminophen (TYLENOL) tablet 650 mg  650 mg Oral Q6H PRN Laverle Hobby, PA-C      . alum & mag hydroxide-simeth (MAALOX/MYLANTA) 200-200-20 MG/5ML suspension 30 mL  30 mL Oral Q4H PRN Laverle Hobby, PA-C      . furosemide (LASIX) tablet 40 mg  40 mg Oral Daily Benjamine Mola, FNP      . magnesium hydroxide (MILK OF MAGNESIA) suspension 30 mL  30 mL Oral Daily PRN Laverle Hobby, PA-C      . QUEtiapine (SEROQUEL) tablet 50 mg  50 mg Oral BID Benjamine Mola, FNP      . sulfamethoxazole-trimethoprim (BACTRIM DS,SEPTRA DS) 800-160 MG per tablet 1 tablet  1 tablet Oral BID Laverle Hobby, PA-C   1 tablet at 09/12/15 0813   PTA Medications: Prescriptions prior to admission  Medication Sig Dispense Refill Last Dose  . furosemide (LASIX) 40 MG tablet Take 1 tablet (40 mg total) by mouth daily. 5 tablet 0 09/09/2015 at Unknown time  . naproxen (NAPROSYN) 375 MG tablet Take 1 tablet (375 mg total) by mouth 2 (two) times daily. (Patient not taking: Reported on 09/09/2015) 20 tablet 0 Not Taking at Unknown time  . ondansetron (ZOFRAN-ODT) 4 MG disintegrating tablet Take 4 mg by mouth every 8 (eight) hours as needed for nausea or vomiting. Reported on 09/09/2015   Not Taking at Unknown time  . [DISCONTINUED] sulfamethoxazole-trimethoprim (BACTRIM DS,SEPTRA DS) 800-160 MG tablet Take 1 tablet by mouth 2 (two) times daily. 20 tablet 0 09/09/2015 at Unknown time    Musculoskeletal: Strength & Muscle Tone: within normal limits Gait & Station: normal Patient leans: N/A  Psychiatric Specialty Exam: Physical Exam  Nursing note and vitals reviewed. Constitutional: He is oriented to person, place, and time. He appears well-developed and well-nourished.  No distress.  HENT:  Head: Normocephalic.  Eyes: Pupils are equal, round, and reactive to light.  Neurological: He is alert and oriented to person, place, and time. No cranial nerve deficit.  Skin: Skin is warm and dry. He is not diaphoretic.    Review of Systems  Psychiatric/Behavioral: Positive for depression. Negative for suicidal ideas, hallucinations and substance abuse. The patient is nervous/anxious and has insomnia.   All other systems reviewed and are negative.   Blood pressure 115/61, pulse 80, temperature 98.6 F (37 C), temperature source Oral, resp. rate 20, height 6\' 1"  (1.854 m), weight 80.74 kg (178 lb), SpO2 98 %.Body mass index  is 23.49 kg/(m^2).  General Appearance: Disheveled  Eye Sport and exercise psychologist::  Fair  Speech:  Clear and Coherent and Normal Rate  Volume:  Normal  Mood:  Anxious  Affect:  Congruent  Thought Process:  Coherent and Goal Directed  Orientation:  Full (Time, Place, and Person)  Thought Content:  Thoughts about leaving with his meds and concerns about followup  Suicidal Thoughts:  No  Homicidal Thoughts:  No  Memory:  Recent;   Fair  Judgement:  Poor  Insight:  Lacking  Psychomotor Activity:  Negative  Concentration:  Fair  Recall:  [place] of Knowledge:Poor  Language: Fair  Akathisia:  Negative  Handed:  Right  AIMS (if indicated):     Assets:  Desire for Improvement  ADL's:  Intact  Cognition: WNL  Sleep:       Treatment Plan Summary: Schizophrenia, chronic condition with acute exacerbation ([place]), stable for discharge and outpatient management  Disposition; -Discharge home with outpatient follow-up at [facility] -14 day script for Seroquel 50mg  PO bid for agitation/paranoid ideation/anxiety -7 day script for Bactrim DS PO bid for boil/abscess on lower back (was 10 days total with 7 remaining)  Benjamine Mola, [place] 3/15/201712:22 PM

## 2015-09-14 ENCOUNTER — Encounter (HOSPITAL_COMMUNITY): Payer: Self-pay

## 2015-09-14 ENCOUNTER — Emergency Department (HOSPITAL_COMMUNITY)
Admission: EM | Admit: 2015-09-14 | Discharge: 2015-09-14 | Disposition: A | Discharge status: Home / Self Care | Payer: Self-pay | Attending: Emergency Medicine | Admitting: Emergency Medicine

## 2015-09-14 DIAGNOSIS — Z590 Homelessness: Secondary | ICD-10-CM | POA: Insufficient documentation

## 2015-09-14 DIAGNOSIS — Z792 Long term (current) use of antibiotics: Secondary | ICD-10-CM | POA: Insufficient documentation

## 2015-09-14 DIAGNOSIS — I890 Lymphedema, not elsewhere classified: Secondary | ICD-10-CM | POA: Insufficient documentation

## 2015-09-14 DIAGNOSIS — F200 Paranoid schizophrenia: Secondary | ICD-10-CM | POA: Insufficient documentation

## 2015-09-14 DIAGNOSIS — Z872 Personal history of diseases of the skin and subcutaneous tissue: Secondary | ICD-10-CM | POA: Insufficient documentation

## 2015-09-14 DIAGNOSIS — R600 Localized edema: Secondary | ICD-10-CM | POA: Insufficient documentation

## 2015-09-14 DIAGNOSIS — J069 Acute upper respiratory infection, unspecified: Secondary | ICD-10-CM | POA: Insufficient documentation

## 2015-09-14 DIAGNOSIS — Z862 Personal history of diseases of the blood and blood-forming organs and certain disorders involving the immune mechanism: Secondary | ICD-10-CM | POA: Insufficient documentation

## 2015-09-14 DIAGNOSIS — Z79899 Other long term (current) drug therapy: Secondary | ICD-10-CM | POA: Insufficient documentation

## 2015-09-14 MED ORDER — AMOXICILLIN-POT CLAVULANATE 875-125 MG PO TABS: 1.0000 | ORAL_TABLET | Freq: Two times a day (BID) | ORAL | Status: DC

## 2015-09-14 MED ORDER — GUAIFENESIN 100 MG/5ML PO LIQD: 100.0000 mg | ORAL | Status: DC | PRN

## 2015-09-14 MED ORDER — IBUPROFEN 400 MG PO TABS
400.0000 mg | ORAL_TABLET | Freq: Once | ORAL | Status: AC
  Administered 2015-09-14: 400 mg via ORAL

## 2015-09-14 NOTE — ED Notes (Signed)
Registration at bedside.

## 2015-09-14 NOTE — Discharge Instructions (Signed)
Edema Edema is an abnormal buildup of fluids in your bodytissues. Edema is somewhatdependent on gravity to pull the fluid to the lowest place in your body. That makes the condition more common in the legs and thighs (lower extremities). Painless swelling of the feet and ankles is common and becomes more likely as you get older. It is also common in looser tissues, like around your eyes.  When the affected area is squeezed, the fluid may move out of that spot and leave a dent for a few moments. This dent is called pitting.  CAUSES  There are many possible causes of edema. Eating too much salt and being on your feet or sitting for a long time can cause edema in your legs and ankles. Hot weather may make edema worse. Common medical causes of edema include:  Heart failure.  Liver disease.  Kidney disease.  Weak blood vessels in your legs.  Cancer.  An injury.  Pregnancy.  Some medications.  Obesity. SYMPTOMS  Edema is usually painless.Your skin may look swollen or shiny.  DIAGNOSIS  Your health care provider may be able to diagnose edema by asking about your medical history and doing a physical exam. You may need to have tests such as X-rays, an electrocardiogram, or blood tests to check for medical conditions that may cause edema.  TREATMENT  Edema treatment depends on the cause. If you have heart, liver, or kidney disease, you need the treatment appropriate for these conditions. General treatment may include:  Elevation of the affected body part above the level of your heart.  Compression of the affected body part. Pressure from elastic bandages or support stockings squeezes the tissues and forces fluid back into the blood vessels. This keeps fluid from entering the tissues.  Restriction of fluid and salt intake.  Use of a water pill (diuretic). These medications are appropriate only for some types of edema. They pull fluid out of your body and make you urinate more often.  This gets rid of fluid and reduces swelling, but diuretics can have side effects. Only use diuretics as directed by your health care provider. HOME CARE INSTRUCTIONS   Keep the affected body part above the level of your heart when you are lying down.   Do not sit still or stand for prolonged periods.   Do not put anything directly under your knees when lying down.  Do not wear constricting clothing or garters on your upper legs.   Exercise your legs to work the fluid back into your blood vessels. This may help the swelling go down.   Wear elastic bandages or support stockings to reduce ankle swelling as directed by your health care provider.   Eat a low-salt diet to reduce fluid if your health care provider recommends it.   Only take medicines as directed by your health care provider. SEEK MEDICAL CARE IF:   Your edema is not responding to treatment.  You have heart, liver, or kidney disease and notice symptoms of edema.  You have edema in your legs that does not improve after elevating them.   You have sudden and unexplained weight gain. SEEK IMMEDIATE MEDICAL CARE IF:   You develop shortness of breath or chest pain.   You cannot breathe when you lie down.  You develop pain, redness, or warmth in the swollen areas.   You have heart, liver, or kidney disease and suddenly get edema.  You have a fever and your symptoms suddenly get worse. MAKE SURE YOU:  Understand these instructions.  Will watch your condition.  Will get help right away if you are not doing well or get worse.   This information is not intended to replace advice given to you by your health care provider. Make sure you discuss any questions you have with your health care provider.   Document Released: 06/16/2005 Document Revised: 07/07/2014 Document Reviewed: 04/08/2013 Elsevier Interactive Patient Education 2016 Elsevier Inc. Upper Respiratory Infection, Adult Most upper respiratory  infections (URIs) are a viral infection of the air passages leading to the lungs. A URI affects the nose, throat, and upper air passages. The most common type of URI is nasopharyngitis and is typically referred to as "the common cold." URIs run their course and usually go away on their own. Most of the time, a URI does not require medical attention, but sometimes a bacterial infection in the upper airways can follow a viral infection. This is called a secondary infection. Sinus and middle ear infections are common types of secondary upper respiratory infections. Bacterial pneumonia can also complicate a URI. A URI can worsen asthma and chronic obstructive pulmonary disease (COPD). Sometimes, these complications can require emergency medical care and may be life threatening.  CAUSES Almost all URIs are caused by viruses. A virus is a type of germ and can spread from one person to another.  RISKS FACTORS You may be at risk for a URI if:   You smoke.   You have chronic heart or lung disease.  You have a weakened defense (immune) system.   You are very young or very old.   You have nasal allergies or asthma.  You work in crowded or poorly ventilated areas.  You work in health care facilities or schools. SIGNS AND SYMPTOMS  Symptoms typically develop 2-3 days after you come in contact with a cold virus. Most viral URIs last 7-10 days. However, viral URIs from the influenza virus (flu virus) can last 14-18 days and are typically more severe. Symptoms may include:   Runny or stuffy (congested) nose.   Sneezing.   Cough.   Sore throat.   Headache.   Fatigue.   Fever.   Loss of appetite.   Pain in your forehead, behind your eyes, and over your cheekbones (sinus pain).  Muscle aches.  DIAGNOSIS  Your health care provider may diagnose a URI by:  Physical exam.  Tests to check that your symptoms are not due to another condition such as:  Strep  throat.  Sinusitis.  Pneumonia.  Asthma. TREATMENT  A URI goes away on its own with time. It cannot be cured with medicines, but medicines may be prescribed or recommended to relieve symptoms. Medicines may help:  Reduce your fever.  Reduce your cough.  Relieve nasal congestion. HOME CARE INSTRUCTIONS   Take medicines only as directed by your health care provider.   Gargle warm saltwater or take cough drops to comfort your throat as directed by your health care provider.  Use a warm mist humidifier or inhale steam from a shower to increase air moisture. This may make it easier to breathe.  Drink enough fluid to keep your urine clear or pale yellow.   Eat soups and other clear broths and maintain good nutrition.   Rest as needed.   Return to work when your temperature has returned to normal or as your health care provider advises. You may need to stay home longer to avoid infecting others. You can also use a face mask and  careful hand washing to prevent spread of the virus.  Increase the usage of your inhaler if you have asthma.   Do not use any tobacco products, including cigarettes, chewing tobacco, or electronic cigarettes. If you need help quitting, ask your health care provider. PREVENTION  The best way to protect yourself from getting a cold is to practice good hygiene.   Avoid oral or hand contact with people with cold symptoms.   Wash your hands often if contact occurs.  There is no clear evidence that vitamin C, vitamin E, echinacea, or exercise reduces the chance of developing a cold. However, it is always recommended to get plenty of rest, exercise, and practice good nutrition.  SEEK MEDICAL CARE IF:   You are getting worse rather than better.   Your symptoms are not controlled by medicine.   You have chills.  You have worsening shortness of breath.  You have brown or red mucus.  You have yellow or brown nasal discharge.  You have pain in your  face, especially when you bend forward.  You have a fever.  You have swollen neck glands.  You have pain while swallowing.  You have white areas in the back of your throat. SEEK IMMEDIATE MEDICAL CARE IF:   You have severe or persistent:  Headache.  Ear pain.  Sinus pain.  Chest pain.  You have chronic lung disease and any of the following:  Wheezing.  Prolonged cough.  Coughing up blood.  A change in your usual mucus.  You have a stiff neck.  You have changes in your:  Vision.  Hearing.  Thinking.  Mood. MAKE SURE YOU:   Understand these instructions.  Will watch your condition.  Will get help right away if you are not doing well or get worse.   This information is not intended to replace advice given to you by your health care provider. Make sure you discuss any questions you have with your health care provider.   Document Released: 12/10/2000 Document Revised: 10/31/2014 Document Reviewed: 09/21/2013 Elsevier Interactive Patient Education [employer].

## 2015-09-14 NOTE — ED Notes (Signed)
Pt here with c/o chills, productive cough of yellow phlegm, runny nose, head aches and reports chronic pain and swelling to both legs. Pt denies abd pain/N/V/D.

## 2015-09-14 NOTE — ED Notes (Signed)
Patient states he cannot go to the pharmacy tonight.  Patient requesting medications here at ED.  Pt offered Ibuprofen.

## 2015-09-14 NOTE — ED Notes (Signed)
Patient able to ambulate independently  

## 2015-09-14 NOTE — ED Provider Notes (Signed)
CSN: [id number]     Arrival date & time 09/14/15  0029 History   First MD Initiated Contact with Patient 09/14/15 0039     Chief Complaint  Patient presents with  . URI     (Consider location/radiation/quality/duration/timing/severity/associated sxs/prior Treatment) HPI Comments: Patient presents to the emergency department with chief complaint of cough. He states that he has had a cough and cold-like symptoms for the past several days. States that he is now coughing up yellow sputum. He denies any fevers or chills. Denies any chest pain. He reports that he has chronic right lower extremity lymphedema, which is unchanged. He denies any other new symptoms. He has not taken anything for her symptoms. There are no modifying factors.  The history is provided by the patient. No language interpreter was used.    Past Medical History  Diagnosis Date  . Anemia   . Paranoid schizophrenia ([place])   . Cellulitis   . Homelessness   . Lymphedema of leg    History reviewed. No pertinent past surgical history. Family History  Problem Relation Age of Onset  . CAD      Neg HX  . Hypertension      Neg Hx  . Diabetes      Neg Hx  . Cancer      Neg Hx   Social History  Substance Use Topics  . Smoking status: Never Smoker   . Smokeless tobacco: Never Used  . Alcohol Use: No    Review of Systems  Constitutional: Negative for fever and chills.  HENT: Positive for postnasal drip, rhinorrhea, sinus pressure, sneezing and sore throat.   Respiratory: Positive for cough. Negative for shortness of breath.   Cardiovascular: Positive for leg swelling. Negative for chest pain.  Gastrointestinal: Negative for nausea, vomiting, abdominal pain, diarrhea and constipation.  Genitourinary: Negative for dysuria.      Allergies  Review of patient's allergies indicates no known allergies.  Home Medications   Prior to Admission medications   Medication Sig Start Date End Date Taking? Authorizing  Provider  amoxicillin-clavulanate (AUGMENTIN) 875-125 MG tablet Take 1 tablet by mouth every 12 (twelve) hours. 09/14/15   Montine Circle, PA-C  guaiFENesin (ROBITUSSIN) 100 MG/5ML liquid Take 5-10 mLs (100-200 mg total) by mouth every 4 (four) hours as needed for cough. 09/14/15   Montine Circle, PA-C  QUEtiapine (SEROQUEL) 50 MG tablet Take 1 tablet (50 mg total) by mouth 2 (two) times daily. 09/12/15   Benjamine Mola, FNP  sulfamethoxazole-trimethoprim (BACTRIM DS,SEPTRA DS) 800-160 MG tablet Take 1 tablet by mouth 2 (two) times daily. 09/12/15   Elyse Jarvis Withrow, FNP   BP 126/91 mmHg  Pulse 83  Temp(Src) 97.4 F (36.3 C) (Oral)  Resp 18  Ht 6\' 1"  (1.854 m)  Wt 87.743 kg  BMI 25.53 kg/m2  SpO2 99% Physical Exam Physical Exam  Constitutional: Pt  is oriented to person, place, and time. Appears well-developed and well-nourished. No distress.  HENT:  Head: Normocephalic and atraumatic.  Right Ear: Tympanic membrane, external ear and ear canal normal.  Left Ear: Tympanic membrane, external ear and ear canal normal.  Nose: Mucosal edema and mild rhinorrhea present. No epistaxis. Right sinus exhibits no maxillary sinus tenderness and no frontal sinus tenderness. Left sinus exhibits no maxillary sinus tenderness and no frontal sinus tenderness.  Mouth/Throat: Uvula is midline and mucous membranes are normal. Mucous membranes are not pale and not cyanotic. No oropharyngeal exudate, posterior oropharyngeal edema, posterior oropharyngeal erythema or tonsillar  abscesses.  Eyes: Conjunctivae are normal. Pupils are equal, round, and reactive to light.  Neck: Normal range of motion and full passive range of motion without pain.  Cardiovascular: Normal rate and intact distal pulses.   Pulmonary/Chest: Effort normal and breath sounds normal. No stridor.  Clear and equal breath sounds without focal wheezes, rhonchi, rales  Abdominal: Soft. Bowel sounds are normal. There is no tenderness.   Musculoskeletal: Normal range of motion.  chronic lymphedema of right lower extremity  Lymphadenopathy:    Pthas no cervical adenopathy.  Neurological: Pt is alert and oriented to person, place, and time.  Skin: Skin is warm and dry. No rash noted. Pt is not diaphoretic.  no evidence of cellulitis or abscess.  Psychiatric: Normal mood and affect.  Nursing note and vitals reviewed.   ED Course  Procedures (including critical care time)   MDM   Final diagnoses:  URI (upper respiratory infection)  Edema of right lower extremity   Patients symptoms are consistent with URI, likely viral etiology, but might consider abx given length of symptoms. Discussed that antibiotics are not indicated for viral infections. Pt will be discharged with symptomatic treatment.  Verbalizes understanding and is agreeable with plan. Pt is hemodynamically stable & in NAD prior to dc.  No new changes with right lower extremity lymphedema. No evidence of abscess or cellulitis. No tenderness to palpation.     Montine Circle, PA-C 09/14/15 [place], MD 09/18/15 [phone]

## 2015-09-18 ENCOUNTER — Emergency Department (HOSPITAL_COMMUNITY)
Admission: EM | Admit: 2015-09-18 | Discharge: 2015-09-19 | Disposition: A | Discharge status: Home / Self Care | Payer: Self-pay

## 2015-09-18 NOTE — ED Notes (Signed)
Pt's name called for triage no answer 

## 2015-09-20 ENCOUNTER — Encounter (HOSPITAL_COMMUNITY): Payer: Self-pay | Admitting: Emergency Medicine

## 2015-09-20 ENCOUNTER — Emergency Department (HOSPITAL_COMMUNITY)
Admission: EM | Admit: 2015-09-20 | Discharge: 2015-09-20 | Disposition: A | Discharge status: Home / Self Care | Payer: Self-pay | Attending: Emergency Medicine | Admitting: Emergency Medicine

## 2015-09-20 DIAGNOSIS — F200 Paranoid schizophrenia: Secondary | ICD-10-CM | POA: Insufficient documentation

## 2015-09-20 DIAGNOSIS — Z79899 Other long term (current) drug therapy: Secondary | ICD-10-CM | POA: Insufficient documentation

## 2015-09-20 DIAGNOSIS — Z872 Personal history of diseases of the skin and subcutaneous tissue: Secondary | ICD-10-CM | POA: Insufficient documentation

## 2015-09-20 DIAGNOSIS — I890 Lymphedema, not elsewhere classified: Secondary | ICD-10-CM | POA: Insufficient documentation

## 2015-09-20 DIAGNOSIS — Z590 Homelessness: Secondary | ICD-10-CM | POA: Insufficient documentation

## 2015-09-20 DIAGNOSIS — Z792 Long term (current) use of antibiotics: Secondary | ICD-10-CM | POA: Insufficient documentation

## 2015-09-20 DIAGNOSIS — Z862 Personal history of diseases of the blood and blood-forming organs and certain disorders involving the immune mechanism: Secondary | ICD-10-CM | POA: Insufficient documentation

## 2015-09-20 MED ORDER — IBUPROFEN 200 MG PO TABS
600.0000 mg | ORAL_TABLET | Freq: Once | ORAL | Status: AC
  Administered 2015-09-20: 600 mg via ORAL
  Filled 2015-09-20: qty 3

## 2015-09-20 NOTE — ED Provider Notes (Signed)
CSN: [id number]     Arrival date & time 09/20/15  0220 History   None    Chief Complaint  Patient presents with  . Leg Pain     (Consider location/radiation/quality/duration/timing/severity/associated sxs/prior Treatment) HPI Comments: 45 year old male with a history of chronic lymphedema to the right leg presents with discomfort stating he needs a TED hose   denies any injury or change in his condition. He is yelling at staff demanding amenities.  Patient is a 45 y.o. male presenting with leg pain. The history is provided by the patient.  Leg Pain Location:  Leg Injury: no   Leg location:  R lower leg Pain details:    Quality:  Aching   Radiates to:  Does not radiate   Severity:  Moderate   Onset quality:  Gradual   Timing:  Constant Chronicity:  Chronic Dislocation: no   Prior injury to area:  No Relieved by:  Nothing Associated symptoms: no fever     Past Medical History  Diagnosis Date  . Anemia   . Paranoid schizophrenia ([place])   . Cellulitis   . Homelessness   . Lymphedema of leg    History reviewed. No pertinent past surgical history. Family History  Problem Relation Age of Onset  . CAD      Neg HX  . Hypertension      Neg Hx  . Diabetes      Neg Hx  . Cancer      Neg Hx   Social History  Substance Use Topics  . Smoking status: Never Smoker   . Smokeless tobacco: Never Used  . Alcohol Use: No    Review of Systems  Constitutional: Negative for fever.  Respiratory: Negative for shortness of breath.   Cardiovascular: Positive for leg swelling.  Skin: Negative for wound.  Neurological: Negative for weakness and numbness.  All other systems reviewed and are negative.     Allergies  Review of patient's allergies indicates no known allergies.  Home Medications   Prior to Admission medications   Medication Sig Start Date End Date Taking? Authorizing Provider  amoxicillin-clavulanate (AUGMENTIN) 875-125 MG tablet Take 1 tablet by mouth every 12  (twelve) hours. 09/14/15   Montine Circle, PA-C  guaiFENesin (ROBITUSSIN) 100 MG/5ML liquid Take 5-10 mLs (100-200 mg total) by mouth every 4 (four) hours as needed for cough. 09/14/15   Montine Circle, PA-C  QUEtiapine (SEROQUEL) 50 MG tablet Take 1 tablet (50 mg total) by mouth 2 (two) times daily. 09/12/15   Benjamine Mola, FNP  sulfamethoxazole-trimethoprim (BACTRIM DS,SEPTRA DS) 800-160 MG tablet Take 1 tablet by mouth 2 (two) times daily. 09/12/15   Elyse Jarvis Withrow, FNP   BP 112/66 mmHg  Pulse 73  Temp(Src) 97.4 F (36.3 C) (Oral)  Resp 18  SpO2 98% Physical Exam  Constitutional: He is oriented to person, place, and time. He appears well-developed and well-nourished.  HENT:  Head: Normocephalic.  Eyes: Pupils are equal, round, and reactive to light.  Neck: Normal range of motion.  Cardiovascular: Normal rate and regular rhythm.   Musculoskeletal: He exhibits edema.       Legs: Neurological: He is alert and oriented to person, place, and time.  Nursing note and vitals reviewed.   ED Course  Procedures (including critical care time) Labs Review Labs Reviewed - No data to display  Imaging Review No results found. I have personally reviewed and evaluated these images and lab results as part of my medical decision-making.  EKG Interpretation None     Patient has chronic lymphedema of the right lower extremity will be fitted with an extra large TED hose given ibuprofen for his discomfort.  MDM   Final diagnoses:  Lymphedema of right lower extremity         Junius Creamer, NP 09/20/15 [place], MD 09/21/15 1710

## 2015-09-20 NOTE — ED Notes (Signed)
Patient given 2nd orange juice per his request. Patient aware we need to apply compression stockings.

## 2015-09-20 NOTE — ED Notes (Signed)
Patient ate sandwich, threw his trash on the floor.

## 2015-09-20 NOTE — ED Notes (Signed)
Compression stocking applied to R leg by Asencion Partridge, NT, per MD order

## 2015-09-20 NOTE — ED Notes (Signed)
Requested ted hose from 6th floor.

## 2015-09-20 NOTE — ED Notes (Signed)
Pt states that he has bilateral leg pain and swelling. Worse in R side. Alert and oriented.

## 2015-09-20 NOTE — ED Notes (Signed)
Patient was called from lobby, stated he needed a wheelchair. Wheelchair was taken to patient. He stood quickly from his chair and sat into the wheelchair unassisted. Patient was taken to hall B. Patient began immediately to demand a blanket and pillow. Patient head of bed was raised. Patient was requested to turn his legs to the side for safe exit, he instead jumped out of the wheelchair. Patient laid backwards in the bed, with his feet elevated. Patient was advised their were no pillows available at this time. Patient attempted to assess patient.   Patient reports bilateral leg pain and swelling x2-3 days. Patient denies taking any medications at home for pain, states he does not have any. Patient is verbally aggressive. Security on stand by.

## 2015-09-20 NOTE — Discharge Instructions (Signed)
Lymphedema Lymphedema is swelling that is caused by the abnormal collection of lymph under the skin. Lymph is fluid from the tissues in your body that travels in the lymphatic system. This system is part of the immune system and includes lymph nodes and lymph vessels. The lymph vessels collect and carry the excess fluid, fats, proteins, and wastes from the tissues of the body to the bloodstream. This system also works to clean and remove bacteria and waste products from the body. Lymphedema occurs when the lymphatic system is blocked. When the lymph vessels or lymph nodes are blocked or damaged, lymph does not drain properly, causing an abnormal buildup of lymph. This leads to swelling in the arms or legs. Lymphedema cannot be cured by medicines, but various methods can be used to help reduce the swelling. CAUSES There are two types of lymphedema. Primary lymphedema is caused by the absence or abnormality of the lymph vessel at birth. Secondary lymphedema is more common. It occurs when the lymph vessel is damaged or blocked. Common causes of lymph vessel blockage include:  Skin infection, such as cellulitis.  Infection by parasites (filariasis).  Injury.  Cancer.  Radiation therapy.  Formation of scar tissue.  Surgery. SYMPTOMS Symptoms of this condition include:  Swelling of the arm or leg.  A heavy or tight feeling in the arm or leg.  Swelling of the feet, toes, or fingers. Shoes or rings may fit more tightly than before.  Redness of the skin over the affected area.  Limited movement of the affected limb.  Sensitivity to touch or discomfort in the affected limb. DIAGNOSIS This condition may be diagnosed with:  A physical exam.  Medical history.  Imaging tests, such as:  Lymphoscintigraphy. In this test, a low dose of a radioactive substance is injected to trace the flow of lymph through the lymph vessels.  MRI.  CT scan.  Duplex ultrasound. This test uses sound waves  to produce images of the vessels and the blood flow on a screen.  Lymphangiography. In this test, a contrast dye is injected into the lymph vessel to help show blockages. TREATMENT Treatment for this condition may depend on the cause. Treatment may include:  Exercise. Certain exercises can help fluid move out of the affected limb.  Massage. Gentle massage of the affected limb can help move the fluid out of the area.  Compression. Various methods may be used to apply pressure to the affected limb in order to reduce the swelling.  Wearing compression stockings or sleeves on the affected limb.  Bandaging the affected limb.  Using an external pump that is attached to a sleeve that alternates between applying pressure and releasing pressure.  Surgery. This is usually only done for severe cases. For example, surgery may be done if you have trouble moving the limb or if the swelling does not get better with other treatments. If an underlying condition is causing the lymphedema, treatment for that condition is needed. For example, antibiotic medicines may be used to treat an infection. HOME CARE INSTRUCTIONS Activities  Exercise regularly as directed by your health care provider.  Do not sit with your legs crossed.  When possible, keep the affected limb raised (elevated) above the level of your heart.  Avoid carrying things with an arm that is affected by lymphedema.  Remember that the affected area is more likely to become injured or infected.  Take these steps to help prevent infection:  Keep the affected area clean and dry.  Protect  your skin from cuts. For example, you should use gloves while cooking or gardening. Do not walk barefoot. If you shave the affected area, use an Copy. General Instructions  Take medicines only as directed by your health care provider.  Eat a healthy diet that includes a lot of fruits and vegetables.  Do not wear tight clothes, shoes, or  jewelry.  Do not use heating pads over the affected area.  Avoid having blood pressure checked on the affected limb.  Keep all follow-up visits as directed by your health care provider. This is important. SEEK MEDICAL CARE IF:  You continue to have swelling in your limb.  You have a fever.  You have a cut that does not heal.  You have redness or pain in the affected area.  You have new swelling in your limb that comes on suddenly.  You develop purplish spots or sores (lesions) on your limb. SEEK IMMEDIATE MEDICAL CARE IF:  You have a skin rash.  You have chills or sweats.  You have shortness of breath.   This information is not intended to replace advice given to you by your health care provider. Make sure you discuss any questions you have with your health care provider.   Document Released: 04/13/2007 Document Revised: 10/31/2014 Document Reviewed: 05/24/2014 Elsevier Interactive Patient Education 2016 [employer]. How to Use Compression Stockings Compression stockings are elastic socks that squeeze the legs. They help to increase blood flow to the legs, decrease swelling in the legs, and reduce the chance of developing blood clots in the lower legs. Compression stockings are often used by people who:  Are recovering from surgery.  Have poor circulation in their legs.  Are prone to getting blood clots in their legs.  Have varicose veins.  Sit or stay in bed for long periods of time. HOW TO USE COMPRESSION STOCKINGS Before you put on your compression stockings:  Make sure that they are the correct size. If you do not know your size, ask your health care provider.  Make sure that they are clean, dry, and in good condition.  Check them for rips and tears. Do not put them on if they are ripped or torn. Put your stockings on first thing in the morning, before you get out of bed. Keep them on for as long as your health care provider advises. When you are wearing your  stockings:  Keep them as smooth as possible. Do not allow them to bunch up. It is especially important to prevent the stockings from bunching up around your toes or behind your knees.  Do not roll the stockings downward and leave them rolled down. This can decrease blood flow to your leg.  Change them right away if they become wet or dirty. When you take off your stockings, inspect your legs and feet. Anything that does not seem normal may require medical attention. Look for:  Open sores.  Red spots.  Swelling. INFORMATION AND TIPS  Do not stop wearing your compression stockings without talking to your health care provider first.  Wash your stockings everyday with mild detergent in cold or warm water. Do not use bleach. Air-dry your stockings or dry them in a clothes dryer on low heat.  Replace your stockings every 3-6 months.  If skin moisturizing is part of your treatment plan, apply lotion or cream at night so that your skin will be dry when you put on the stockings in the morning. It is harder to put  the stockings on when you have lotion on your legs or feet. SEEK MEDICAL CARE IF: Remove your stockings and seek medical care if:  You have a feeling of pins and needles in your feet or legs.  You have any new changes in your skin.  You have skin lesions that are getting worse.  You have swelling or pain that is getting worse. SEEK IMMEDIATE MEDICAL CARE IF:  You have numbness or tingling in your lower legs that does not get better immediately after you take the stockings off.  Your toes or feet become cold and blue.  You develop open sores or red spots on your legs that do not go away.  You see or feel a warm spot on your leg.  You have new swelling or soreness in your leg.  You are short of breath or you have chest pain for no reason.  You have a rapid or irregular heartbeat.  You feel light-headed or dizzy.   This information is not intended to replace advice  given to you by your health care provider. Make sure you discuss any questions you have with your health care provider.   Document Released: 04/13/2009 Document Revised: 10/31/2014 Document Reviewed: 05/24/2014 Elsevier Interactive Patient Education [employer].
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
## 2015-09-21 ENCOUNTER — Encounter (HOSPITAL_COMMUNITY): Payer: Self-pay

## 2015-09-21 DIAGNOSIS — Z590 Homelessness: Secondary | ICD-10-CM | POA: Insufficient documentation

## 2015-09-21 DIAGNOSIS — Z872 Personal history of diseases of the skin and subcutaneous tissue: Secondary | ICD-10-CM | POA: Insufficient documentation

## 2015-09-21 DIAGNOSIS — F200 Paranoid schizophrenia: Secondary | ICD-10-CM | POA: Insufficient documentation

## 2015-09-21 DIAGNOSIS — M545 Low back pain: Secondary | ICD-10-CM | POA: Insufficient documentation

## 2015-09-21 DIAGNOSIS — Z792 Long term (current) use of antibiotics: Secondary | ICD-10-CM | POA: Insufficient documentation

## 2015-09-21 DIAGNOSIS — Z79899 Other long term (current) drug therapy: Secondary | ICD-10-CM | POA: Insufficient documentation

## 2015-09-21 DIAGNOSIS — Z862 Personal history of diseases of the blood and blood-forming organs and certain disorders involving the immune mechanism: Secondary | ICD-10-CM | POA: Insufficient documentation

## 2015-09-21 DIAGNOSIS — I890 Lymphedema, not elsewhere classified: Secondary | ICD-10-CM | POA: Insufficient documentation

## 2015-09-21 NOTE — ED Notes (Signed)
Pt here with c/o lower back pain onset 2 days ago and states right leg is swollen. Right leg is significantly swollen.

## 2015-09-22 ENCOUNTER — Emergency Department (HOSPITAL_COMMUNITY)
Admission: EM | Admit: 2015-09-22 | Discharge: 2015-09-22 | Disposition: A | Discharge status: Home / Self Care | Payer: Self-pay | Attending: Emergency Medicine | Admitting: Emergency Medicine

## 2015-09-22 DIAGNOSIS — R600 Localized edema: Secondary | ICD-10-CM

## 2015-09-22 LAB — COMPREHENSIVE METABOLIC PANEL
ALK PHOS: 58 U/L (ref 38–126)
ALT: 17 U/L (ref 17–63)
ANION GAP: 10 (ref 5–15)
AST: 26 U/L (ref 15–41)
Albumin: 3.1 g/dL — ABNORMAL LOW (ref 3.5–5.0)
BUN: 24 mg/dL — ABNORMAL HIGH (ref 6–20)
CO2: 24 mmol/L (ref 22–32)
CREATININE: 1.46 mg/dL — AB (ref 0.61–1.24)
Calcium: 8.7 mg/dL — ABNORMAL LOW (ref 8.9–10.3)
Chloride: 104 mmol/L (ref 101–111)
GFR calc non Af Amer: 56 mL/min — ABNORMAL LOW (ref >60)
Glucose, Bld: 149 mg/dL — ABNORMAL HIGH (ref 65–99)
POTASSIUM: 3.7 mmol/L (ref 3.5–5.1)
SODIUM: 138 mmol/L (ref 135–145)
TOTAL PROTEIN: 5.8 g/dL — AB (ref 6.5–8.1)
Total Bilirubin: 0.3 mg/dL (ref 0.3–1.2)

## 2015-09-22 LAB — CBC
HCT: 33.6 % — ABNORMAL LOW (ref 39.0–52.0)
HEMOGLOBIN: 11.6 g/dL — AB (ref 13.0–17.0)
MCH: 32.9 pg (ref 26.0–34.0)
MCHC: 34.5 g/dL (ref 30.0–36.0)
MCV: 95.2 fL (ref 78.0–100.0)
PLATELETS: 166 10*3/uL (ref 150–400)
RBC: 3.53 MIL/uL — ABNORMAL LOW (ref 4.22–5.81)
RDW: 13.3 % (ref 11.5–15.5)
WBC: 4.6 10*3/uL (ref 4.0–10.5)

## 2015-09-22 MED ORDER — TRAMADOL HCL 50 MG PO TABS: 50.0000 mg | ORAL_TABLET | Freq: Two times a day (BID) | ORAL | Status: DC | PRN

## 2015-09-22 NOTE — ED Notes (Signed)
Patient walking around room with steady gait. Removed BP cuff and pulse oximeter - explained to patient the importance of monitoring VS - he stated "I don't need that stuff on the whole time."

## 2015-09-22 NOTE — ED Notes (Signed)
Waiting for TED hose to arrive from materials. Pt given [place] sandwich and bus pass.

## 2015-09-22 NOTE — ED Provider Notes (Signed)
CSN: [id number]     Arrival date & time 09/21/15  2245 History  By signing my name below, I, Emmanuella Mensah, attest that this documentation has been prepared under the direction and in the presence of [name], MD. Electronically Signed: Judithann Sauger, ED Scribe. 09/22/2015. 2:12 AM.    Chief Complaint  Patient presents with  . Back Pain  . Leg Swelling   The history is provided by the patient. No language interpreter was used.   HPI Comments: Jonathan Meadows is a 45 y.o. male with a hx of anemia and lymphedema of leg who presents to the Emergency Department complaining of gradually worsening right leg pain with swelling onset 2 days ago. Pt also reports non-radiating right lower back pain several days ago as well. Pt states that he is here for pain medication and compression stockings. He denies any recent falls, injuries, or trauma. No fever, chills, CP, or any open wounds.    Past Medical History  Diagnosis Date  . Anemia   . Paranoid schizophrenia ([place])   . Cellulitis   . Homelessness   . Lymphedema of leg    History reviewed. No pertinent past surgical history. Family History  Problem Relation Age of Onset  . CAD      Neg HX  . Hypertension      Neg Hx  . Diabetes      Neg Hx  . Cancer      Neg Hx   Social History  Substance Use Topics  . Smoking status: Never Smoker   . Smokeless tobacco: Never Used  . Alcohol Use: No    Review of Systems  Constitutional: Negative for fever and chills.  Cardiovascular: Positive for leg swelling. Negative for chest pain.  Musculoskeletal: Positive for back pain.  Skin: Negative for wound.  All other systems reviewed and are negative.     Allergies  Review of patient's allergies indicates no known allergies.  Home Medications   Prior to Admission medications   Medication Sig Start Date End Date Taking? Authorizing Provider  amoxicillin-clavulanate (AUGMENTIN) 875-125 MG tablet Take 1 tablet by mouth every 12  (twelve) hours. 09/14/15   Montine Circle, PA-C  guaiFENesin (ROBITUSSIN) 100 MG/5ML liquid Take 5-10 mLs (100-200 mg total) by mouth every 4 (four) hours as needed for cough. 09/14/15   Montine Circle, PA-C  QUEtiapine (SEROQUEL) 50 MG tablet Take 1 tablet (50 mg total) by mouth 2 (two) times daily. 09/12/15   Benjamine Mola, FNP  sulfamethoxazole-trimethoprim (BACTRIM DS,SEPTRA DS) 800-160 MG tablet Take 1 tablet by mouth 2 (two) times daily. 09/12/15   Elyse Jarvis Withrow, FNP   BP 109/80 mmHg  Pulse 79  Temp(Src) 97.9 F (36.6 C) (Oral)  Resp 18  SpO2 98% Physical Exam  Constitutional: He is oriented to person, place, and time. Vital signs are normal. He appears well-developed and well-nourished.  Non-toxic appearance. He does not appear ill. No distress.  HENT:  Head: Normocephalic and atraumatic.  Nose: Nose normal.  Mouth/Throat: Oropharynx is clear and moist. No oropharyngeal exudate.  Eyes: Conjunctivae and EOM are normal. Pupils are equal, round, and reactive to light. No scleral icterus.  Neck: Normal range of motion. Neck supple. No tracheal deviation, no edema, no erythema and normal range of motion present. No thyroid mass and no thyromegaly present.  Cardiovascular: Normal rate, regular rhythm, S1 normal, S2 normal, normal heart sounds, intact distal pulses and normal pulses.  Exam reveals no gallop and no friction rub.  No murmur heard. Pulmonary/Chest: Effort normal and breath sounds normal. No respiratory distress. He has no wheezes. He has no rhonchi. He has no rales.  Abdominal: Soft. Normal appearance and bowel sounds are normal. He exhibits no distension, no ascites and no mass. There is no hepatosplenomegaly. There is no tenderness. There is no rebound, no guarding and no CVA tenderness.  Musculoskeletal: Normal range of motion. He exhibits edema. He exhibits no tenderness.  Right lower extremity lymphedema to thigh No TTP of spine or paraspinal area.   Lymphadenopathy:     He has no cervical adenopathy.  Neurological: He is alert and oriented to person, place, and time. He has normal strength. No cranial nerve deficit or sensory deficit.  Normal strength and sensation in all extremities. Normal cerebellar testing.  Skin: Skin is warm, dry and intact. No petechiae and no rash noted. He is not diaphoretic. No erythema. No pallor.  Psychiatric: He has a normal mood and affect. His behavior is normal. Judgment normal.  Nursing note and vitals reviewed.   ED Course  Procedures (including critical care time) DIAGNOSTIC STUDIES: Oxygen Saturation is 98% on RA, normal by my interpretation.    COORDINATION OF CARE: 1:51 AM- Pt advised of plan for treatment and pt agrees. Pt informed of lab work results.    Labs Review Labs Reviewed  CBC - Abnormal; Notable for the following:    RBC 3.53 (*)    Hemoglobin 11.6 (*)    HCT 33.6 (*)    All other components within normal limits  COMPREHENSIVE METABOLIC PANEL - Abnormal; Notable for the following:    Glucose, Bld 149 (*)    BUN 24 (*)    Creatinine, Ser 1.46 (*)    Calcium 8.7 (*)    Total Protein 5.8 (*)    Albumin 3.1 (*)    GFR calc non Af Amer 56 (*)    All other components within normal limits    [name], MD has personally reviewed and evaluated these images and lab results as part of his medical decision-making.   MDM   Final diagnoses:  None   Patient presents to emergency departent for compresion stockings and pain medication. Will ask nursing to provide stockings and discharged with 10 tables of tramadol to take as needed. He apears well in no acute ditress, vital sign wre within his normal limits and he is safe for disharge.   I personally performed the services described in this documentation, which was scribed in my presence. The recorded information has been reviewed and is accurate.     [name], MD 09/22/15 [phone]

## 2015-09-22 NOTE — Discharge Instructions (Signed)
Edema Mr. Jonathan Meadows, wear compression stockings and see your primary care doctor within 3 days for close follow up. If symptoms worsen, come back to the ED immediately. Thank you. Edema is an abnormal buildup of fluids. It is more common in your legs and thighs. Painless swelling of the feet and ankles is more likely as a person ages. It also is common in looser skin, like around your eyes. HOME CARE   Keep the affected body part above the level of the heart while lying down.  Do not sit still or stand for a long time.  Do not put anything right under your knees when you lie down.  Do not wear tight clothes on your upper legs.  Exercise your legs to help the puffiness (swelling) go down.  Wear elastic bandages or support stockings as told by your doctor.  A low-salt diet may help lessen the puffiness.  Only take medicine as told by your doctor. GET HELP IF:  Treatment is not working.  You have heart, liver, or kidney disease and notice that your skin looks puffy or shiny.  You have puffiness in your legs that does not get better when you raise your legs.  You have sudden weight gain for no reason. GET HELP RIGHT AWAY IF:   You have shortness of breath or chest pain.  You cannot breathe when you lie down.  You have pain, redness, or warmth in the areas that are puffy.  You have heart, liver, or kidney disease and get edema all of a sudden.  You have a fever and your symptoms get worse all of a sudden. MAKE SURE YOU:   Understand these instructions.  Will watch your condition.  Will get help right away if you are not doing well or get worse.   This information is not intended to replace advice given to you by your health care provider. Make sure you discuss any questions you have with your health care provider.   Document Released: 12/03/2007 Document Revised: 06/21/2013 Document Reviewed: 04/08/2013 Elsevier Interactive Patient Education [employer].

## 2015-09-22 NOTE — ED Notes (Signed)
Patient verbalized understanding of discharge instructions and denies any further needs or questions at this time. VS stable. Patient ambulatory with steady gait. Patient given bus pass and TED hose.

## 2015-10-25 ENCOUNTER — Encounter (HOSPITAL_COMMUNITY): Payer: Self-pay | Admitting: Emergency Medicine

## 2015-10-25 DIAGNOSIS — R1030 Lower abdominal pain, unspecified: Secondary | ICD-10-CM | POA: Insufficient documentation

## 2015-10-25 DIAGNOSIS — K0889 Other specified disorders of teeth and supporting structures: Secondary | ICD-10-CM | POA: Insufficient documentation

## 2015-10-25 LAB — CBC
HEMATOCRIT: 39 % (ref 39.0–52.0)
HEMOGLOBIN: 13.1 g/dL (ref 13.0–17.0)
MCH: 31.3 pg (ref 26.0–34.0)
MCHC: 33.6 g/dL (ref 30.0–36.0)
MCV: 93.1 fL (ref 78.0–100.0)
Platelets: 233 10*3/uL (ref 150–400)
RBC: 4.19 MIL/uL — ABNORMAL LOW (ref 4.22–5.81)
RDW: 12.5 % (ref 11.5–15.5)
WBC: 4.9 10*3/uL (ref 4.0–10.5)

## 2015-10-25 NOTE — ED Notes (Signed)
Pt. reports low abdominal pain and mouth pain onset this week , denies emesis or diarrhea.

## 2015-10-26 ENCOUNTER — Emergency Department (HOSPITAL_COMMUNITY)
Admission: EM | Admit: 2015-10-26 | Discharge: 2015-10-26 | Disposition: A | Discharge status: Home / Self Care | Payer: Self-pay | Attending: Emergency Medicine | Admitting: Emergency Medicine

## 2015-10-26 LAB — COMPREHENSIVE METABOLIC PANEL
ALBUMIN: 3.5 g/dL (ref 3.5–5.0)
ALK PHOS: 55 U/L (ref 38–126)
ALT: 15 U/L — ABNORMAL LOW (ref 17–63)
ANION GAP: 10 (ref 5–15)
AST: 25 U/L (ref 15–41)
BUN: 15 mg/dL (ref 6–20)
CO2: 27 mmol/L (ref 22–32)
Calcium: 9.4 mg/dL (ref 8.9–10.3)
Chloride: 105 mmol/L (ref 101–111)
Creatinine, Ser: 1.27 mg/dL — ABNORMAL HIGH (ref 0.61–1.24)
GFR calc Af Amer: >60 mL/min (ref >60)
GFR calc non Af Amer: >60 mL/min (ref >60)
GLUCOSE: 133 mg/dL — AB (ref 65–99)
POTASSIUM: 3.4 mmol/L — AB (ref 3.5–5.1)
SODIUM: 142 mmol/L (ref 135–145)
Total Bilirubin: 0.5 mg/dL (ref 0.3–1.2)
Total Protein: 6.8 g/dL (ref 6.5–8.1)

## 2015-10-26 LAB — URINALYSIS, ROUTINE W REFLEX MICROSCOPIC
BILIRUBIN URINE: NEGATIVE
GLUCOSE, UA: NEGATIVE mg/dL
HGB URINE DIPSTICK: NEGATIVE
Ketones, ur: NEGATIVE mg/dL
Leukocytes, UA: NEGATIVE
Nitrite: NEGATIVE
PH: 5.5 (ref 5.0–8.0)
Protein, ur: NEGATIVE mg/dL
SPECIFIC GRAVITY, URINE: 1.024 (ref 1.005–1.030)

## 2015-10-26 LAB — LIPASE, BLOOD: Lipase: 20 U/L (ref 11–51)

## 2015-10-26 NOTE — ED Notes (Signed)
Pt called for room, answered. Pt stated that he didn't want to go back to a room and ambulated out of the ED. Pt moved OTF

## 2016-06-04 DIAGNOSIS — G894 Chronic pain syndrome: Secondary | ICD-10-CM | POA: Insufficient documentation

## 2016-10-31 DIAGNOSIS — K311 Adult hypertrophic pyloric stenosis: Secondary | ICD-10-CM | POA: Insufficient documentation

## 2017-02-09 ENCOUNTER — Encounter (HOSPITAL_COMMUNITY): Payer: Self-pay | Admitting: Emergency Medicine

## 2017-02-09 DIAGNOSIS — R197 Diarrhea, unspecified: Secondary | ICD-10-CM | POA: Diagnosis not present

## 2017-02-09 DIAGNOSIS — R112 Nausea with vomiting, unspecified: Secondary | ICD-10-CM | POA: Diagnosis not present

## 2017-02-09 LAB — COMPREHENSIVE METABOLIC PANEL
ALK PHOS: 48 U/L (ref 38–126)
ALT: 13 U/L — ABNORMAL LOW (ref 17–63)
ANION GAP: 6 (ref 5–15)
AST: 22 U/L (ref 15–41)
Albumin: 3.3 g/dL — ABNORMAL LOW (ref 3.5–5.0)
BILIRUBIN TOTAL: 0.6 mg/dL (ref 0.3–1.2)
BUN: 15 mg/dL (ref 6–20)
CALCIUM: 8.7 mg/dL — AB (ref 8.9–10.3)
CO2: 25 mmol/L (ref 22–32)
Chloride: 107 mmol/L (ref 101–111)
Creatinine, Ser: 1 mg/dL (ref 0.61–1.24)
GFR calc non Af Amer: >60 mL/min (ref >60)
Glucose, Bld: 103 mg/dL — ABNORMAL HIGH (ref 65–99)
Potassium: 3.6 mmol/L (ref 3.5–5.1)
SODIUM: 138 mmol/L (ref 135–145)
TOTAL PROTEIN: 6.9 g/dL (ref 6.5–8.1)

## 2017-02-09 LAB — CBC
HCT: 35 % — ABNORMAL LOW (ref 39.0–52.0)
HEMOGLOBIN: 12 g/dL — AB (ref 13.0–17.0)
MCH: 31.3 pg (ref 26.0–34.0)
MCHC: 34.3 g/dL (ref 30.0–36.0)
MCV: 91.4 fL (ref 78.0–100.0)
Platelets: 231 10*3/uL (ref 150–400)
RBC: 3.83 MIL/uL — AB (ref 4.22–5.81)
RDW: 13.1 % (ref 11.5–15.5)
WBC: 5.1 10*3/uL (ref 4.0–10.5)

## 2017-02-09 LAB — URINALYSIS, ROUTINE W REFLEX MICROSCOPIC
Bilirubin Urine: NEGATIVE
Glucose, UA: NEGATIVE mg/dL
Hgb urine dipstick: NEGATIVE
KETONES UR: NEGATIVE mg/dL
Leukocytes, UA: NEGATIVE
NITRITE: NEGATIVE
PROTEIN: NEGATIVE mg/dL
Specific Gravity, Urine: 1.013 (ref 1.005–1.030)
pH: 5 (ref 5.0–8.0)

## 2017-02-09 LAB — LIPASE, BLOOD: Lipase: 22 U/L (ref 11–51)

## 2017-02-09 MED ORDER — ONDANSETRON 4 MG PO TBDP
4.0000 mg | ORAL_TABLET | Freq: Once | ORAL | Status: AC | PRN
  Administered 2017-02-09: 4 mg via ORAL

## 2017-02-09 MED ORDER — ONDANSETRON 4 MG PO TBDP
ORAL_TABLET | ORAL | Status: AC
  Filled 2017-02-09: qty 1

## 2017-02-09 NOTE — ED Triage Notes (Signed)
Pt presents to Ed for assessment of abdominal pain, naujsea asnd vomiting, diarrhea, and bloody stools.

## 2017-02-10 ENCOUNTER — Emergency Department (HOSPITAL_COMMUNITY)
Admission: EM | Admit: 2017-02-10 | Discharge: 2017-02-10 | Disposition: A | Discharge status: Home / Self Care | Payer: Medicaid Other | Attending: Emergency Medicine | Admitting: Emergency Medicine

## 2017-02-10 DIAGNOSIS — R112 Nausea with vomiting, unspecified: Secondary | ICD-10-CM

## 2017-02-10 DIAGNOSIS — R197 Diarrhea, unspecified: Secondary | ICD-10-CM

## 2017-02-10 MED ORDER — ONDANSETRON HCL 4 MG PO TABS: 4.0000 mg | ORAL_TABLET | Freq: Four times a day (QID) | ORAL | 0 refills | Status: DC

## 2017-02-10 MED ORDER — LOPERAMIDE HCL 2 MG PO CAPS: 2.0000 mg | ORAL_CAPSULE | Freq: Four times a day (QID) | ORAL | 0 refills | Status: DC | PRN

## 2017-02-10 NOTE — ED Notes (Signed)
No answer when pt called for vitals

## 2017-02-10 NOTE — ED Provider Notes (Signed)
[facility] DEPT Provider Note   CSN: [id number] Arrival date & time: 02/09/17  2142     History   Chief Complaint Chief Complaint  Patient presents with  . Abdominal Pain    HPI Jonathan Meadows is a 46 y.o. male.  Patient presents to the emergency department with chief complaint of nausea, vomiting, diarrhea. He states that the symptoms started 2 days ago. He reports some associated crampy abdominal pain. He denies any fevers or chills. He states that he believes he saw some small amount of blood in his stool.he denies any lightheadedness or shortness of breath.he has not taken any medications for her symptoms. There are no other associated symptoms or modifying factors.   The history is provided by the patient. No language interpreter was used.    Past Medical History:  Diagnosis Date  . Anemia   . Cellulitis   . Homelessness   . Lymphedema of leg   . Paranoid schizophrenia [facility])     Patient Active Problem List   Diagnosis Date Noted  . Schizophrenia, chronic condition with acute exacerbation ([place]) 09/11/2015  . GAD (generalized anxiety disorder)   . Cellulitis and abscess of finger 10/10/2012  . Edema of right lower extremity 10/10/2012  . Anemia 10/10/2012    History reviewed. No pertinent surgical history.     Home Medications    Prior to Admission medications   Medication Sig Start Date End Date Taking? Authorizing Provider  traMADol (ULTRAM) 50 MG tablet Take 1 tablet (50 mg total) by mouth every 12 (twelve) hours as needed. 09/22/15   [name], MD    Family History Family History  Problem Relation Age of Onset  . CAD Unknown        Neg HX  . Hypertension Unknown        Neg Hx  . Diabetes Unknown        Neg Hx  . Cancer Unknown        Neg Hx    Social History Social History  Substance Use Topics  . Smoking status: Never Smoker  . Smokeless tobacco: Never Used  . Alcohol use No     Allergies   Patient has no known  allergies.   Review of Systems Review of Systems  All other systems reviewed and are negative.    Physical Exam Updated Vital Signs BP 120/89 (BP Location: Right Arm)   Pulse 82   Temp (!) 97.5 F (36.4 C) (Oral)   Resp 18   SpO2 97%   Physical Exam  Constitutional: He is oriented to person, place, and time. He appears well-developed and well-nourished.  HENT:  Head: Normocephalic and atraumatic.  Eyes: Pupils are equal, round, and reactive to light. Conjunctivae and EOM are normal. Right eye exhibits no discharge. Left eye exhibits no discharge. No scleral icterus.  Neck: Normal range of motion. Neck supple. No JVD present.  Cardiovascular: Normal rate, regular rhythm and normal heart sounds.  Exam reveals no gallop and no friction rub.   No murmur heard. Pulmonary/Chest: Effort normal and breath sounds normal. No respiratory distress. He has no wheezes. He has no rales. He exhibits no tenderness.  Abdominal: Soft. He exhibits no distension and no mass. There is no tenderness. There is no rebound and no guarding.  No focal abdominal tenderness, no RLQ tenderness or pain at McBurney's point, no RUQ tenderness or Murphy's sign, no left-sided abdominal tenderness, no fluid wave, or signs of peritonitis   Musculoskeletal: Normal range of  motion. He exhibits no edema or tenderness.  Neurological: He is alert and oriented to person, place, and time.  Skin: Skin is warm and dry.  Psychiatric: He has a normal mood and affect. His behavior is normal. Judgment and thought content normal.  Nursing note and vitals reviewed.    ED Treatments / Results  Labs (all labs ordered are listed, but only abnormal results are displayed) Labs Reviewed  COMPREHENSIVE METABOLIC PANEL - Abnormal; Notable for the following:       Result Value   Glucose, Bld 103 (*)    Calcium 8.7 (*)    Albumin 3.3 (*)    ALT 13 (*)    All other components within normal limits  CBC - Abnormal; Notable for the  following:    RBC 3.83 (*)    Hemoglobin 12.0 (*)    HCT 35.0 (*)    All other components within normal limits  LIPASE, BLOOD  URINALYSIS, ROUTINE W REFLEX MICROSCOPIC    EKG  EKG Interpretation None       Radiology No results found.  Procedures Procedures (including critical care time)  Medications Ordered in ED Medications  ondansetron (ZOFRAN-ODT) 4 MG disintegrating tablet (not administered)  ondansetron (ZOFRAN-ODT) disintegrating tablet 4 mg (4 mg Oral Given 02/09/17 2159)     Initial Impression / Assessment and Plan / ED Course  I have reviewed the triage vital signs and the nursing notes.  Pertinent labs & imaging results that were available during my care of the patient were reviewed by me and considered in my medical decision making (see chart for details).     Patient with nausea, vomiting, diarrhea. Symptoms have lasted for 2 days. His vital signs are stable. He is in no acute distress. Laboratory workup is reassuring. Will treat with Zofran and Imodium.  Suspect viral process.  Doubt surgical or acute abdomen.  Patient instructed to return for:  New or worsening symptoms, including, increased abdominal pain, especially pain that localizes to one side, bloody vomit, bloody diarrhea, fever >101, and intractable vomiting.   Final Clinical Impressions(s) / ED Diagnoses   Final diagnoses:  Nausea vomiting and diarrhea    New Prescriptions New Prescriptions   LOPERAMIDE (IMODIUM) 2 MG CAPSULE    Take 1 capsule (2 mg total) by mouth 4 (four) times daily as needed for diarrhea or loose stools.   ONDANSETRON (ZOFRAN) 4 MG TABLET    Take 1 tablet (4 mg total) by mouth every 6 (six) hours.     Montine Circle, PA-C 02/10/17 Marianna Fuss, MD 02/10/17 1000

## 2017-02-10 NOTE — ED Notes (Signed)
After discharge materials reviewed pt stood a the door way of his room looking a this nurse while touching his penis. Security informed and pt escorted out.

## 2017-06-02 ENCOUNTER — Emergency Department (HOSPITAL_COMMUNITY): Payer: Medicaid Other

## 2017-06-02 ENCOUNTER — Other Ambulatory Visit: Payer: Self-pay

## 2017-06-02 ENCOUNTER — Encounter (HOSPITAL_COMMUNITY): Payer: Self-pay | Admitting: Emergency Medicine

## 2017-06-02 DIAGNOSIS — Z5321 Procedure and treatment not carried out due to patient leaving prior to being seen by health care provider: Secondary | ICD-10-CM | POA: Diagnosis not present

## 2017-06-02 DIAGNOSIS — M791 Myalgia, unspecified site: Secondary | ICD-10-CM | POA: Diagnosis present

## 2017-06-02 LAB — URINALYSIS, ROUTINE W REFLEX MICROSCOPIC
BILIRUBIN URINE: NEGATIVE
Glucose, UA: NEGATIVE mg/dL
Hgb urine dipstick: NEGATIVE
KETONES UR: 5 mg/dL — AB
Leukocytes, UA: NEGATIVE
NITRITE: NEGATIVE
Protein, ur: NEGATIVE mg/dL
SPECIFIC GRAVITY, URINE: 1.03 (ref 1.005–1.030)
pH: 5 (ref 5.0–8.0)

## 2017-06-02 LAB — CBC WITH DIFFERENTIAL/PLATELET
BASOS ABS: 0 10*3/uL (ref 0.0–0.1)
BASOS PCT: 1 %
EOS ABS: 0.3 10*3/uL (ref 0.0–0.7)
EOS PCT: 5 %
HCT: 36.8 % — ABNORMAL LOW (ref 39.0–52.0)
Hemoglobin: 12.3 g/dL — ABNORMAL LOW (ref 13.0–17.0)
Lymphocytes Relative: 42 %
Lymphs Abs: 2.3 10*3/uL (ref 0.7–4.0)
MCH: 31.1 pg (ref 26.0–34.0)
MCHC: 33.4 g/dL (ref 30.0–36.0)
MCV: 93.2 fL (ref 78.0–100.0)
Monocytes Absolute: 0.4 10*3/uL (ref 0.1–1.0)
Monocytes Relative: 7 %
Neutro Abs: 2.4 10*3/uL (ref 1.7–7.7)
Neutrophils Relative %: 45 %
PLATELETS: 240 10*3/uL (ref 150–400)
RBC: 3.95 MIL/uL — AB (ref 4.22–5.81)
RDW: 12.5 % (ref 11.5–15.5)
WBC: 5.4 10*3/uL (ref 4.0–10.5)

## 2017-06-02 LAB — BASIC METABOLIC PANEL
ANION GAP: 9 (ref 5–15)
BUN: 17 mg/dL (ref 6–20)
CALCIUM: 8.9 mg/dL (ref 8.9–10.3)
CO2: 28 mmol/L (ref 22–32)
CREATININE: 1.19 mg/dL (ref 0.61–1.24)
Chloride: 101 mmol/L (ref 101–111)
Glucose, Bld: 106 mg/dL — ABNORMAL HIGH (ref 65–99)
Potassium: 3.2 mmol/L — ABNORMAL LOW (ref 3.5–5.1)
SODIUM: 138 mmol/L (ref 135–145)

## 2017-06-02 LAB — I-STAT CG4 LACTIC ACID, ED: LACTIC ACID, VENOUS: 1.1 mmol/L (ref 0.5–1.9)

## 2017-06-02 NOTE — ED Triage Notes (Signed)
Pt having 7/10 generalized body ache and cold like symptoms since last night with some chills.

## 2017-06-03 ENCOUNTER — Emergency Department (HOSPITAL_COMMUNITY)
Admission: EM | Admit: 2017-06-03 | Discharge: 2017-06-03 | Discharge status: Against Medical Advice | Payer: Medicaid Other | Attending: Emergency Medicine | Admitting: Emergency Medicine

## 2017-06-03 NOTE — ED Notes (Signed)
Called in lobby  no answer 

## 2017-06-03 NOTE — ED Notes (Signed)
Pt called multiple times, no answer 

## 2017-06-03 NOTE — ED Notes (Signed)
No answer for treatment room x1

## 2017-06-03 NOTE — ED Notes (Signed)
Pt is in lobby- he is lying down on a bench

## 2017-06-04 ENCOUNTER — Encounter (HOSPITAL_COMMUNITY): Payer: Self-pay | Admitting: Emergency Medicine

## 2017-06-04 ENCOUNTER — Emergency Department (HOSPITAL_COMMUNITY)
Admission: EM | Admit: 2017-06-04 | Discharge: 2017-06-04 | Disposition: A | Discharge status: Home / Self Care | Payer: Medicaid Other | Attending: Emergency Medicine | Admitting: Emergency Medicine

## 2017-06-04 DIAGNOSIS — R0981 Nasal congestion: Secondary | ICD-10-CM | POA: Diagnosis not present

## 2017-06-04 DIAGNOSIS — R05 Cough: Secondary | ICD-10-CM | POA: Diagnosis not present

## 2017-06-04 DIAGNOSIS — R6883 Chills (without fever): Secondary | ICD-10-CM | POA: Diagnosis not present

## 2017-06-04 DIAGNOSIS — Z79899 Other long term (current) drug therapy: Secondary | ICD-10-CM | POA: Diagnosis not present

## 2017-06-04 DIAGNOSIS — B9789 Other viral agents as the cause of diseases classified elsewhere: Secondary | ICD-10-CM | POA: Insufficient documentation

## 2017-06-04 DIAGNOSIS — R0982 Postnasal drip: Secondary | ICD-10-CM | POA: Insufficient documentation

## 2017-06-04 DIAGNOSIS — J069 Acute upper respiratory infection, unspecified: Secondary | ICD-10-CM | POA: Diagnosis not present

## 2017-06-04 DIAGNOSIS — R067 Sneezing: Secondary | ICD-10-CM | POA: Diagnosis present

## 2017-06-04 MED ORDER — FLUTICASONE PROPIONATE 50 MCG/ACT NA SUSP: 2.0000 | Freq: Every day | NASAL | 0 refills | Status: DC

## 2017-06-04 MED ORDER — NAPROXEN 250 MG PO TABS: 250.0000 mg | ORAL_TABLET | Freq: Two times a day (BID) | ORAL | 0 refills | Status: DC

## 2017-06-04 MED ORDER — NAPROXEN 250 MG PO TABS
500.0000 mg | ORAL_TABLET | Freq: Once | ORAL | Status: AC
  Administered 2017-06-04: 500 mg via ORAL
  Filled 2017-06-04: qty 2

## 2017-06-04 MED ORDER — CETIRIZINE HCL 10 MG PO TABS: 10.0000 mg | ORAL_TABLET | Freq: Every day | ORAL | 1 refills | Status: DC

## 2017-06-04 MED ORDER — BENZONATATE 100 MG PO CAPS: 100.0000 mg | ORAL_CAPSULE | Freq: Three times a day (TID) | ORAL | 0 refills | Status: DC | PRN

## 2017-06-04 NOTE — ED Triage Notes (Signed)
Pt to ED c/o cold/flu-like symptoms. Patient was here 2 days ago and had blood drawn but left before being seen. Pt reports sore throat, ear pain, congestion, and cough with phlegm x 3-4 days. Pt denies fevers but states he's had chills today. Afebrile in triage. Resp e/u. Skin warm/dry.

## 2017-06-04 NOTE — ED Provider Notes (Signed)
[facility] EMERGENCY DEPARTMENT Provider Note   CSN: [id number] Arrival date & time: 06/04/17  1859     History   Chief Complaint Chief Complaint  Patient presents with  . Influenza    HPI Jonathan Meadows is a 46 y.o. male.  Jonathan Meadows is a 46 y.o. Male presents to the emergency department complaining of URI symptoms for the past 2 days.  Patient complains of sneezing, nasal congestion, postnasal drip, coughing and trouble breathing through his nose for the past 2 days.  He reports he felt chilled earlier today, but denies any fevers.  No treatments attempted prior to arrival.  He denies any trouble swallowing and reports his sore throat has resolved.  He ports associated postnasal drip.  He denies any shortness of breath or chest pain.  He denies fevers, neck pain, vomiting, diarrhea, rashes, trouble swallowing, shortness of breath or chest pain.   The history is provided by the patient and medical records. No language interpreter was used.  Influenza  Presenting symptoms: cough and rhinorrhea   Presenting symptoms: no diarrhea, no fever, no nausea, no shortness of breath, no sore throat and no vomiting   Associated symptoms: chills and nasal congestion     Past Medical History:  Diagnosis Date  . Anemia   . Cellulitis   . Homelessness   . Lymphedema of leg   . Paranoid schizophrenia [facility])     Patient Active Problem List   Diagnosis Date Noted  . Schizophrenia, chronic condition with acute exacerbation ([place]) 09/11/2015  . GAD (generalized anxiety disorder)   . Cellulitis and abscess of finger 10/10/2012  . Edema of right lower extremity 10/10/2012  . Anemia 10/10/2012    History reviewed. No pertinent surgical history.     Home Medications    Prior to Admission medications   Medication Sig Start Date End Date Taking? Authorizing Provider  benzonatate (TESSALON) 100 MG capsule Take 1 capsule (100 mg total) by mouth 3 (three) times daily as  needed for cough. 06/04/17   Waynetta Pean, PA-C  cetirizine (ZYRTEC ALLERGY) 10 MG tablet Take 1 tablet (10 mg total) by mouth daily. 06/04/17   Waynetta Pean, PA-C  fluticasone (FLONASE) 50 MCG/ACT nasal spray Place 2 sprays into both nostrils daily. 06/04/17   Waynetta Pean, PA-C  loperamide (IMODIUM) 2 MG capsule Take 1 capsule (2 mg total) by mouth 4 (four) times daily as needed for diarrhea or loose stools. 02/10/17   Montine Circle, PA-C  naproxen (NAPROSYN) 250 MG tablet Take 1 tablet (250 mg total) by mouth 2 (two) times daily with a meal. 06/04/17   Waynetta Pean, PA-C  ondansetron (ZOFRAN) 4 MG tablet Take 1 tablet (4 mg total) by mouth every 6 (six) hours. 02/10/17   Montine Circle, PA-C  traMADol (ULTRAM) 50 MG tablet Take 1 tablet (50 mg total) by mouth every 12 (twelve) hours as needed. 09/22/15   [name], MD    Family History Family History  Problem Relation Age of Onset  . CAD Unknown        Neg HX  . Hypertension Unknown        Neg Hx  . Diabetes Unknown        Neg Hx  . Cancer Unknown        Neg Hx    Social History Social History   Tobacco Use  . Smoking status: Never Smoker  . Smokeless tobacco: Never Used  Substance Use Topics  . Alcohol use:  No  . Drug use: No     Allergies   Patient has no known allergies.   Review of Systems Review of Systems  Constitutional: Positive for chills. Negative for fever.  HENT: Positive for congestion, postnasal drip, rhinorrhea and sneezing. Negative for sore throat and trouble swallowing.   Eyes: Negative for visual disturbance.  Respiratory: Positive for cough. Negative for shortness of breath and wheezing.   Cardiovascular: Negative for chest pain.  Gastrointestinal: Negative for abdominal pain, diarrhea, nausea and vomiting.  Musculoskeletal: Negative for neck pain.  Skin: Negative for rash.  Neurological: Negative for syncope and light-headedness.     Physical Exam Updated Vital Signs BP (!)  142/99 (BP Location: Right Arm)   Pulse 66   Temp 98.1 F (36.7 C) (Axillary)   Resp 18   SpO2 100%   Physical Exam  Constitutional: He appears well-developed and well-nourished. No distress.  Nontoxic appearing.  HENT:  Head: Normocephalic and atraumatic.  Right Ear: External ear normal.  Left Ear: External ear normal.  Mouth/Throat: Oropharynx is clear and moist.  Mild clear middle ear effusion noted bilaterally without TM erythema or loss of landmarks.  Throat is clear.  No tonsillar hypertrophy or exudates.  Uvula is midline.  Rhinorrhea and boggy nasal turbinates noted bilaterally.  Eyes: Conjunctivae are normal. Pupils are equal, round, and reactive to light. Right eye exhibits no discharge. Left eye exhibits no discharge.  Neck: Normal range of motion. Neck supple.  Cardiovascular: Normal rate, regular rhythm, normal heart sounds and intact distal pulses.  Pulmonary/Chest: Effort normal and breath sounds normal. No stridor. No respiratory distress. He has no wheezes. He has no rales.  Lungs are clear to ascultation bilaterally. Symmetric chest expansion bilaterally. No increased work of breathing. No rales or rhonchi.  No wheezing.   Abdominal: Soft. There is no tenderness. There is no guarding.  Lymphadenopathy:    He has no cervical adenopathy.  Neurological: He is alert. Coordination normal.  Skin: Skin is warm and dry. No rash noted. He is not diaphoretic. No pallor.  Psychiatric: He has a normal mood and affect. His behavior is normal.  Nursing note and vitals reviewed.    ED Treatments / Results  Labs (all labs ordered are listed, but only abnormal results are displayed) Labs Reviewed - No data to display  EKG  EKG Interpretation None       Radiology Dg Chest 2 View  Result Date: 06/02/2017 CLINICAL DATA:  Shortness of breath. EXAM: CHEST  2 VIEW COMPARISON:  Radiographs of August 01, 2014. FINDINGS: The heart size and mediastinal contours are within normal  limits. Both lungs are clear. No pneumothorax or pleural effusion is noted. The visualized skeletal structures are unremarkable. IMPRESSION: No active cardiopulmonary disease. Electronically Signed   By: Marijo Conception, M.D.   On: 06/02/2017 21:59    Procedures Procedures (including critical care time)  Medications Ordered in ED Medications  naproxen (NAPROSYN) tablet 500 mg (not administered)     Initial Impression / Assessment and Plan / ED Course  I have reviewed the triage vital signs and the nursing notes.  Pertinent labs & imaging results that were available during my care of the patient were reviewed by me and considered in my medical decision making (see chart for details).    This is a 46 y.o. Male presents to the emergency department complaining of URI symptoms for the past 2 days.  Patient complains of sneezing, nasal congestion, postnasal drip, coughing and  trouble breathing through his nose for the past 2 days.  He reports he felt chilled earlier today, but denies any fevers.  No treatments attempted prior to arrival.  He denies any trouble swallowing and reports his sore throat has resolved.  He ports associated postnasal drip.  He denies any shortness of breath or chest pain. On exam the patient is afebrile nontoxic-appearing.  Lungs are clear to auscultation bilaterally.  He has no tachypnea, tachycardia or hypoxia on exam.  Rhinorrhea is present.  Throat is clear.  Patient with upper respiratory infection.  Will discharge with prescriptions for Flonase, Zyrtec, naproxen and Tessalon Perles.  Return precautions discussed. I advised the patient to follow-up with their primary care provider this week. I advised the patient to return to the emergency department with new or worsening symptoms or new concerns. The patient verbalized understanding and agreement with plan.     Final Clinical Impressions(s) / ED Diagnoses   Final diagnoses:  Viral URI with cough    ED Discharge  Orders        Ordered    fluticasone (FLONASE) 50 MCG/ACT nasal spray  Daily     06/04/17 2111    cetirizine (ZYRTEC ALLERGY) 10 MG tablet  Daily     06/04/17 2111    naproxen (NAPROSYN) 250 MG tablet  2 times daily with meals     06/04/17 2111    benzonatate (TESSALON) 100 MG capsule  3 times daily PRN     06/04/17 2111       Waynetta Pean, PA-C 06/04/17 2116    Dorie Rank, MD 06/04/17 [phone]

## 2017-06-07 DIAGNOSIS — Z598 Other problems related to housing and economic circumstances: Secondary | ICD-10-CM | POA: Insufficient documentation

## 2017-06-07 DIAGNOSIS — F411 Generalized anxiety disorder: Secondary | ICD-10-CM | POA: Diagnosis present

## 2017-06-07 DIAGNOSIS — Z599 Problem related to housing and economic circumstances, unspecified: Secondary | ICD-10-CM | POA: Insufficient documentation

## 2017-06-07 DIAGNOSIS — I890 Lymphedema, not elsewhere classified: Secondary | ICD-10-CM | POA: Insufficient documentation

## 2017-06-14 ENCOUNTER — Emergency Department (HOSPITAL_COMMUNITY)
Admission: EM | Admit: 2017-06-14 | Discharge: 2017-06-15 | Disposition: A | Discharge status: Home / Self Care | Payer: Medicaid Other | Attending: Emergency Medicine | Admitting: Emergency Medicine

## 2017-06-14 ENCOUNTER — Encounter (HOSPITAL_COMMUNITY): Payer: Self-pay | Admitting: *Deleted

## 2017-06-14 ENCOUNTER — Other Ambulatory Visit: Payer: Self-pay

## 2017-06-14 ENCOUNTER — Emergency Department (HOSPITAL_COMMUNITY): Payer: Medicaid Other

## 2017-06-14 DIAGNOSIS — M549 Dorsalgia, unspecified: Secondary | ICD-10-CM | POA: Diagnosis not present

## 2017-06-14 DIAGNOSIS — R0602 Shortness of breath: Secondary | ICD-10-CM | POA: Insufficient documentation

## 2017-06-14 DIAGNOSIS — Z5321 Procedure and treatment not carried out due to patient leaving prior to being seen by health care provider: Secondary | ICD-10-CM | POA: Diagnosis not present

## 2017-06-14 LAB — CBC
HCT: 37.6 % — ABNORMAL LOW (ref 39.0–52.0)
Hemoglobin: 12.7 g/dL — ABNORMAL LOW (ref 13.0–17.0)
MCH: 32 pg (ref 26.0–34.0)
MCHC: 33.8 g/dL (ref 30.0–36.0)
MCV: 94.7 fL (ref 78.0–100.0)
PLATELETS: 233 10*3/uL (ref 150–400)
RBC: 3.97 MIL/uL — ABNORMAL LOW (ref 4.22–5.81)
RDW: 13.2 % (ref 11.5–15.5)
WBC: 4.9 10*3/uL (ref 4.0–10.5)

## 2017-06-14 LAB — BASIC METABOLIC PANEL
Anion gap: 11 (ref 5–15)
BUN: 16 mg/dL (ref 6–20)
CHLORIDE: 107 mmol/L (ref 101–111)
CO2: 22 mmol/L (ref 22–32)
CREATININE: 1.45 mg/dL — AB (ref 0.61–1.24)
Calcium: 8.9 mg/dL (ref 8.9–10.3)
GFR calc Af Amer: >60 mL/min (ref >60)
GFR calc non Af Amer: 56 mL/min — ABNORMAL LOW (ref >60)
GLUCOSE: 98 mg/dL (ref 65–99)
Potassium: 4.1 mmol/L (ref 3.5–5.1)
Sodium: 140 mmol/L (ref 135–145)

## 2017-06-14 LAB — TROPONIN I: Troponin I: <0.03 ng/mL (ref <0.03)

## 2017-06-14 NOTE — ED Notes (Signed)
No answer in [facility]

## 2017-06-14 NOTE — ED Triage Notes (Signed)
The pt c/o sob for 2-3 days he  Is also c/o back pain  No know I injury  No distress

## 2017-06-15 NOTE — ED Notes (Signed)
Called no answer

## 2017-07-02 DIAGNOSIS — A419 Sepsis, unspecified organism: Secondary | ICD-10-CM | POA: Insufficient documentation

## 2017-10-08 ENCOUNTER — Other Ambulatory Visit (HOSPITAL_COMMUNITY): Payer: Self-pay | Admitting: Emergency Medicine

## 2017-10-08 ENCOUNTER — Inpatient Hospital Stay (HOSPITAL_COMMUNITY)
Admission: EM | Admit: 2017-10-08 | Discharge: 2017-10-13 | DRG: 511 | Disposition: A | Discharge status: Home / Self Care | Payer: Medicaid Other | Attending: Internal Medicine | Admitting: Internal Medicine

## 2017-10-08 ENCOUNTER — Emergency Department (HOSPITAL_COMMUNITY): Payer: Medicaid Other

## 2017-10-08 ENCOUNTER — Other Ambulatory Visit: Payer: Self-pay

## 2017-10-08 ENCOUNTER — Encounter (HOSPITAL_COMMUNITY): Payer: Self-pay | Admitting: *Deleted

## 2017-10-08 DIAGNOSIS — Z91018 Allergy to other foods: Secondary | ICD-10-CM | POA: Diagnosis not present

## 2017-10-08 DIAGNOSIS — M19012 Primary osteoarthritis, left shoulder: Secondary | ICD-10-CM

## 2017-10-08 DIAGNOSIS — I4510 Unspecified right bundle-branch block: Secondary | ICD-10-CM | POA: Diagnosis not present

## 2017-10-08 DIAGNOSIS — M7542 Impingement syndrome of left shoulder: Secondary | ICD-10-CM

## 2017-10-08 DIAGNOSIS — M25412 Effusion, left shoulder: Secondary | ICD-10-CM

## 2017-10-08 DIAGNOSIS — F200 Paranoid schizophrenia: Secondary | ICD-10-CM | POA: Diagnosis present

## 2017-10-08 DIAGNOSIS — M009 Pyogenic arthritis, unspecified: Principal | ICD-10-CM | POA: Diagnosis present

## 2017-10-08 DIAGNOSIS — M7502 Adhesive capsulitis of left shoulder: Secondary | ICD-10-CM | POA: Diagnosis present

## 2017-10-08 DIAGNOSIS — F203 Undifferentiated schizophrenia: Secondary | ICD-10-CM | POA: Diagnosis not present

## 2017-10-08 DIAGNOSIS — Z79899 Other long term (current) drug therapy: Secondary | ICD-10-CM

## 2017-10-08 DIAGNOSIS — I890 Lymphedema, not elsewhere classified: Secondary | ICD-10-CM | POA: Diagnosis present

## 2017-10-08 DIAGNOSIS — R112 Nausea with vomiting, unspecified: Secondary | ICD-10-CM

## 2017-10-08 DIAGNOSIS — Q820 Hereditary lymphedema: Secondary | ICD-10-CM | POA: Diagnosis not present

## 2017-10-08 DIAGNOSIS — Z79891 Long term (current) use of opiate analgesic: Secondary | ICD-10-CM

## 2017-10-08 DIAGNOSIS — E876 Hypokalemia: Secondary | ICD-10-CM

## 2017-10-08 DIAGNOSIS — I878 Other specified disorders of veins: Secondary | ICD-10-CM

## 2017-10-08 DIAGNOSIS — L03115 Cellulitis of right lower limb: Secondary | ICD-10-CM | POA: Diagnosis not present

## 2017-10-08 DIAGNOSIS — M25512 Pain in left shoulder: Secondary | ICD-10-CM | POA: Diagnosis present

## 2017-10-08 DIAGNOSIS — R0789 Other chest pain: Secondary | ICD-10-CM | POA: Diagnosis present

## 2017-10-08 DIAGNOSIS — L02414 Cutaneous abscess of left upper limb: Secondary | ICD-10-CM | POA: Diagnosis present

## 2017-10-08 DIAGNOSIS — R079 Chest pain, unspecified: Secondary | ICD-10-CM

## 2017-10-08 DIAGNOSIS — L0390 Cellulitis, unspecified: Secondary | ICD-10-CM | POA: Diagnosis present

## 2017-10-08 LAB — CBC
HEMATOCRIT: 37.2 % — AB (ref 39.0–52.0)
HEMOGLOBIN: 13.2 g/dL (ref 13.0–17.0)
MCH: 31.4 pg (ref 26.0–34.0)
MCHC: 35.5 g/dL (ref 30.0–36.0)
MCV: 88.6 fL (ref 78.0–100.0)
Platelets: 221 10*3/uL (ref 150–400)
RBC: 4.2 MIL/uL — ABNORMAL LOW (ref 4.22–5.81)
RDW: 12.8 % (ref 11.5–15.5)
WBC: 13.1 10*3/uL — ABNORMAL HIGH (ref 4.0–10.5)

## 2017-10-08 LAB — BASIC METABOLIC PANEL
ANION GAP: 14 (ref 5–15)
ANION GAP: 11 (ref 5–15)
ANION GAP: 10 (ref 5–15)
BUN: 15 mg/dL (ref 6–20)
BUN: 8 mg/dL (ref 6–20)
BUN: 7 mg/dL (ref 6–20)
CALCIUM: 7.6 mg/dL — AB (ref 8.9–10.3)
CHLORIDE: 96 mmol/L — AB (ref 101–111)
CHLORIDE: 94 mmol/L — AB (ref 101–111)
CO2: 22 mmol/L (ref 22–32)
CO2: 25 mmol/L (ref 22–32)
CO2: 26 mmol/L (ref 22–32)
Calcium: 8.3 mg/dL — ABNORMAL LOW (ref 8.9–10.3)
Calcium: 7.7 mg/dL — ABNORMAL LOW (ref 8.9–10.3)
Chloride: 95 mmol/L — ABNORMAL LOW (ref 101–111)
Creatinine, Ser: 1.02 mg/dL (ref 0.61–1.24)
Creatinine, Ser: 1.05 mg/dL (ref 0.61–1.24)
Creatinine, Ser: 0.96 mg/dL (ref 0.61–1.24)
GFR calc Af Amer: >60 mL/min (ref >60)
GFR calc Af Amer: >60 mL/min (ref >60)
GFR calc non Af Amer: >60 mL/min (ref >60)
GFR calc non Af Amer: >60 mL/min (ref >60)
GLUCOSE: 118 mg/dL — AB (ref 65–99)
GLUCOSE: 138 mg/dL — AB (ref 65–99)
Glucose, Bld: 161 mg/dL — ABNORMAL HIGH (ref 65–99)
POTASSIUM: 3 mmol/L — AB (ref 3.5–5.1)
Potassium: 2.9 mmol/L — ABNORMAL LOW (ref 3.5–5.1)
Potassium: 3 mmol/L — ABNORMAL LOW (ref 3.5–5.1)
SODIUM: 130 mmol/L — AB (ref 135–145)
SODIUM: 131 mmol/L — AB (ref 135–145)
Sodium: 132 mmol/L — ABNORMAL LOW (ref 135–145)

## 2017-10-08 LAB — HEPATIC FUNCTION PANEL
ALK PHOS: 69 U/L (ref 38–126)
ALT: 29 U/L (ref 17–63)
AST: 32 U/L (ref 15–41)
Albumin: 2.4 g/dL — ABNORMAL LOW (ref 3.5–5.0)
BILIRUBIN DIRECT: 0.4 mg/dL (ref 0.1–0.5)
Indirect Bilirubin: 0.9 mg/dL (ref 0.3–0.9)
Total Bilirubin: 1.3 mg/dL — ABNORMAL HIGH (ref 0.3–1.2)
Total Protein: 7.8 g/dL (ref 6.5–8.1)

## 2017-10-08 LAB — RAPID URINE DRUG SCREEN, HOSP PERFORMED
Amphetamines: NOT DETECTED
Barbiturates: NOT DETECTED
Benzodiazepines: NOT DETECTED
Cocaine: NOT DETECTED
Opiates: NOT DETECTED
TETRAHYDROCANNABINOL: NOT DETECTED

## 2017-10-08 LAB — TROPONIN I
TROPONIN I: 0.05 ng/mL — AB (ref <0.03)
TROPONIN I: 0.15 ng/mL — AB (ref <0.03)
Troponin I: 1.34 ng/mL (ref <0.03)
Troponin I: <0.03 ng/mL (ref <0.03)

## 2017-10-08 LAB — I-STAT TROPONIN, ED: Troponin i, poc: 0 ng/mL (ref 0.00–0.08)

## 2017-10-08 LAB — LIPASE, BLOOD: Lipase: 21 U/L (ref 11–51)

## 2017-10-08 MED ORDER — POTASSIUM CHLORIDE CRYS ER 20 MEQ PO TBCR
40.0000 meq | EXTENDED_RELEASE_TABLET | ORAL | Status: AC
  Administered 2017-10-09 (×2): 40 meq via ORAL
  Filled 2017-10-08 (×2): qty 2

## 2017-10-08 MED ORDER — POTASSIUM CHLORIDE CRYS ER 20 MEQ PO TBCR
40.0000 meq | EXTENDED_RELEASE_TABLET | Freq: Two times a day (BID) | ORAL | Status: DC
  Administered 2017-10-08: 40 meq via ORAL
  Filled 2017-10-08: qty 2

## 2017-10-08 MED ORDER — IOPAMIDOL (ISOVUE-370) INJECTION 76%
INTRAVENOUS | Status: AC
  Filled 2017-10-08: qty 100

## 2017-10-08 MED ORDER — RAMELTEON 8 MG PO TABS
8.0000 mg | ORAL_TABLET | Freq: Every day | ORAL | Status: DC
  Administered 2017-10-08 – 2017-10-12 (×5): 8 mg via ORAL
  Filled 2017-10-08 (×6): qty 1

## 2017-10-08 MED ORDER — POTASSIUM CHLORIDE 10 MEQ/100ML IV SOLN
10.0000 meq | Freq: Once | INTRAVENOUS | Status: AC
  Administered 2017-10-08: 10 meq via INTRAVENOUS
  Filled 2017-10-08: qty 100

## 2017-10-08 MED ORDER — CLINDAMYCIN PHOSPHATE 600 MG/50ML IV SOLN
600.0000 mg | Freq: Once | INTRAVENOUS | Status: AC
  Administered 2017-10-08: 600 mg via INTRAVENOUS
  Filled 2017-10-08: qty 50

## 2017-10-08 MED ORDER — CEFAZOLIN SODIUM-DEXTROSE 1-4 GM/50ML-% IV SOLN
1.0000 g | Freq: Three times a day (TID) | INTRAVENOUS | Status: DC
  Administered 2017-10-08 – 2017-10-10 (×5): 1 g via INTRAVENOUS
  Filled 2017-10-08 (×9): qty 50

## 2017-10-08 MED ORDER — DICLOFENAC SODIUM 1 % TD GEL
2.0000 g | Freq: Four times a day (QID) | TRANSDERMAL | Status: DC
  Administered 2017-10-08 – 2017-10-13 (×13): 2 g via TOPICAL
  Filled 2017-10-08: qty 100

## 2017-10-08 MED ORDER — ENOXAPARIN SODIUM 40 MG/0.4ML ~~LOC~~ SOLN
40.0000 mg | SUBCUTANEOUS | Status: DC
  Administered 2017-10-09 – 2017-10-12 (×4): 40 mg via SUBCUTANEOUS
  Filled 2017-10-08 (×5): qty 0.4

## 2017-10-08 MED ORDER — TRAMADOL HCL 50 MG PO TABS
50.0000 mg | ORAL_TABLET | Freq: Two times a day (BID) | ORAL | Status: DC | PRN
  Administered 2017-10-08 – 2017-10-10 (×4): 50 mg via ORAL
  Filled 2017-10-08 (×4): qty 1

## 2017-10-08 MED ORDER — ACETAMINOPHEN 650 MG RE SUPP: 650.0000 mg | Freq: Four times a day (QID) | RECTAL | Status: DC | PRN

## 2017-10-08 MED ORDER — FENTANYL CITRATE (PF) 100 MCG/2ML IJ SOLN
50.0000 ug | Freq: Once | INTRAMUSCULAR | Status: AC
  Administered 2017-10-08: 50 ug via INTRAVENOUS
  Filled 2017-10-08: qty 2

## 2017-10-08 MED ORDER — POTASSIUM CHLORIDE CRYS ER 20 MEQ PO TBCR
60.0000 meq | EXTENDED_RELEASE_TABLET | Freq: Once | ORAL | Status: AC
  Administered 2017-10-08: 60 meq via ORAL
  Filled 2017-10-08: qty 3

## 2017-10-08 MED ORDER — KETOROLAC TROMETHAMINE 30 MG/ML IJ SOLN
30.0000 mg | Freq: Four times a day (QID) | INTRAMUSCULAR | Status: DC | PRN
  Administered 2017-10-08 – 2017-10-13 (×17): 30 mg via INTRAVENOUS
  Filled 2017-10-08 (×18): qty 1

## 2017-10-08 MED ORDER — ACETAMINOPHEN 325 MG PO TABS
650.0000 mg | ORAL_TABLET | Freq: Four times a day (QID) | ORAL | Status: DC | PRN
  Administered 2017-10-08 – 2017-10-11 (×7): 650 mg via ORAL
  Filled 2017-10-08 (×8): qty 2

## 2017-10-08 MED ORDER — ASPIRIN 81 MG PO CHEW
324.0000 mg | CHEWABLE_TABLET | Freq: Once | ORAL | Status: AC
  Administered 2017-10-08: 324 mg via ORAL
  Filled 2017-10-08: qty 4

## 2017-10-08 MED ORDER — IOPAMIDOL (ISOVUE-370) INJECTION 76%
100.0000 mL | Freq: Once | INTRAVENOUS | Status: AC | PRN
  Administered 2017-10-08: 100 mL via INTRAVENOUS

## 2017-10-08 NOTE — ED Notes (Signed)
Pt's lunch tray arrived. 

## 2017-10-08 NOTE — ED Provider Notes (Signed)
[facility] Provider Note   CSN: [id number] Arrival date & time: 10/08/17  0226     History   Chief Complaint Chief Complaint  Patient presents with  . Cellulitis    HPI Jonathan Meadows is a 47 y.o. male with PMH/o anemia, Lymphedema of RLE who who presents for evaluation of multiple complaints.  Patient states that he has chronic lymphedema of his right lower extremity but over the last 2 days, he has had worsening pain, warmth and redness noted to the lower aspect of the right lower leg. He denies any worsening swelling.  He is unsure of fever.  Patient reports that for the last 2-3 days, he has had left-sided chest pain, left arm pain.  Patient reports difficulty moving arm secondary to pain.  Patient states he does do it is weak.  He denies any preceding trauma, injury, fall.  Patient has difficulty quantifying his chest pain.  He states it is not worse with deep inspiration or with exertion.  Patient also reports he has had several episodes of vomiting over the last few days.  No blood noted in the vomit.  He reports his last episode was yesterday.  Reports decreased p.o.  Patient denies any abdominal pain, numbness to the arms or legs.  The history is provided by the patient.    Past Medical History:  Diagnosis Date  . Anemia   . Cellulitis   . Homelessness   . Lymphedema of leg   . Paranoid schizophrenia [facility])     Patient Active Problem List   Diagnosis Date Noted  . Chest pain 10/08/2017  . Cellulitis 10/08/2017  . Schizophrenia, chronic condition with acute exacerbation ([place]) 09/11/2015  . GAD (generalized anxiety disorder)   . Cellulitis and abscess of finger 10/10/2012  . Edema of right lower extremity 10/10/2012  . Anemia 10/10/2012    No past surgical history on file.      Home Medications    Prior to Admission medications   Medication Sig Start Date End Date Taking? Authorizing Provider  benzonatate (TESSALON) 100 MG capsule Take 1  capsule (100 mg total) by mouth 3 (three) times daily as needed for cough. 06/04/17  Yes Waynetta Pean, PA-C  furosemide (LASIX) 20 MG tablet Take 20 mg by mouth daily.   Yes [provider]  loperamide (IMODIUM) 2 MG capsule Take 1 capsule (2 mg total) by mouth 4 (four) times daily as needed for diarrhea or loose stools. 02/10/17  Yes Montine Circle, PA-C  traMADol (ULTRAM) 50 MG tablet Take 1 tablet (50 mg total) by mouth every 12 (twelve) hours as needed. Patient taking differently: Take 50 mg by mouth every 12 (twelve) hours as needed for moderate pain.  09/22/15  Yes Oni, Lamar Sprinkles, MD  benazepril-hydrochlorthiazide (LOTENSIN HCT) 10-12.5 MG per tablet Take 1 tablet by mouth daily.  09/20/14  [provider]  hydrochlorothiazide (HYDRODIURIL) 25 MG tablet Take 1 tablet (25 mg total) by mouth daily. Patient not taking: Reported on 09/06/2015 11/15/14 09/06/15  Dahlia Bailiff, PA-C  QUEtiapine (SEROQUEL) 50 MG tablet Take 1 tablet (50 mg total) by mouth 2 (two) times daily. Patient not taking: Reported on 09/22/2015 09/12/15 09/22/15  Benjamine Mola, FNP    Family History Family History  Problem Relation Age of Onset  . CAD Unknown        Neg HX  . Hypertension Unknown        Neg Hx  . Diabetes Unknown  Neg Hx  . Cancer Unknown        Neg Hx    Social History Social History   Tobacco Use  . Smoking status: Never Smoker  . Smokeless tobacco: Never Used  Substance Use Topics  . Alcohol use: No  . Drug use: No     Allergies   Pork-derived products   Review of Systems Review of Systems  Constitutional: Negative for chills and fever.  Eyes: Negative for visual disturbance.  Respiratory: Negative for cough and shortness of breath.   Cardiovascular: Positive for chest pain and leg swelling (chronic).  Gastrointestinal: Positive for vomiting. Negative for abdominal pain, diarrhea and nausea.  Genitourinary: Negative for dysuria and hematuria.  Musculoskeletal:  Negative for back pain and neck pain.       LUE pain RLE pain  Skin: Positive for color change. Negative for rash.  Neurological: Negative for dizziness, weakness, numbness and headaches.     Physical Exam Updated Vital Signs BP (!) 126/101 (BP Location: Right Arm)   Pulse 100   Temp 97.6 F (36.4 C) (Oral)   Resp 18   Ht 6\' 1"  (1.854 m)   Wt 97.9 kg (215 lb 13.3 oz)   SpO2 98%   BMI 28.48 kg/m   Physical Exam  Constitutional: He is oriented to person, place, and time. He appears well-developed and well-nourished.  HENT:  Head: Normocephalic and atraumatic.  Mouth/Throat: Oropharynx is clear and moist and mucous membranes are normal.  Eyes: Pupils are equal, round, and reactive to light. Conjunctivae, EOM and lids are normal.  Neck: Full passive range of motion without pain.  Cardiovascular: Normal rate, regular rhythm, normal heart sounds and normal pulses. Exam reveals no gallop and no friction rub.  No murmur heard. Pulses:      Radial pulses are 2+ on the right side, and 2+ on the left side.       Dorsalis pedis pulses are 2+ on the right side, and 2+ on the left side.  Pulmonary/Chest: Effort normal and breath sounds normal.  No evidence of respiratory distress. Able to speak in full sentences without difficulty.  Abdominal: Soft. Normal appearance. There is no tenderness. There is no rigidity and no guarding.  Abdomen is soft, non-distended, non-tender.   Musculoskeletal: Normal range of motion.  Diffuse lymphedema of right lower extremity.  There is some overlying warmth, erythema noted to the distal aspect of the lower extremity.  Neurological: He is alert and oriented to person, place, and time.  Skin: Skin is warm and dry. Capillary refill takes less than 2 seconds.  Good distal cap refill.  LUE is not dusky in appearance or cool to touch.  Psychiatric: He has a normal mood and affect. His speech is normal.  Nursing note and vitals reviewed.    ED Treatments /  Results  Labs (all labs ordered are listed, but only abnormal results are displayed) Labs Reviewed  BASIC METABOLIC PANEL - Abnormal; Notable for the following components:      Result Value   Sodium 132 (*)    Potassium 2.9 (*)    Chloride 96 (*)    Glucose, Bld 118 (*)    Calcium 8.3 (*)    All other components within normal limits  CBC - Abnormal; Notable for the following components:   WBC 13.1 (*)    RBC 4.20 (*)    HCT 37.2 (*)    All other components within normal limits  TROPONIN I - Abnormal; Notable  for the following components:   Troponin I 1.34 (*)    All other components within normal limits  HEPATIC FUNCTION PANEL - Abnormal; Notable for the following components:   Albumin 2.4 (*)    Total Bilirubin 1.3 (*)    All other components within normal limits  RAPID URINE DRUG SCREEN, HOSP PERFORMED  LIPASE, BLOOD  TROPONIN I  BASIC METABOLIC PANEL  TROPONIN I  TROPONIN I  HIV ANTIBODY (ROUTINE TESTING)  I-STAT TROPONIN, ED    EKG EKG Interpretation  Date/Time:  Thursday October 08 2017 06:00:40 EDT Ventricular Rate:  91 PR Interval:  150 QRS Duration: 134 QT Interval:  383 QTC Calculation: 472 R Axis:   29 Text Interpretation:  Sinus rhythm Right bundle branch block No significant change was found Confirmed by Ezequiel Essex [id number]) on 10/08/2017 6:05:01 AM   Radiology Dg Chest 2 View  Result Date: 10/08/2017 CLINICAL DATA:  Chest pain for 2 days. EXAM: CHEST - 2 VIEW COMPARISON:  Radiograph 06/14/2017 FINDINGS: The cardiomediastinal contours are normal. Trace biapical pleuroparenchymal scarring. Bilateral nipple shadows. Pulmonary vasculature is normal. No consolidation, pleural effusion, or pneumothorax. No acute osseous abnormalities are seen. IMPRESSION: No acute findings. Electronically Signed   By: Jeb Levering M.D.   On: 10/08/2017 05:07   Dg Shoulder Left  Result Date: 10/08/2017 CLINICAL DATA:  Left shoulder pain.  No known injury. EXAM: LEFT  SHOULDER - 2+ VIEW COMPARISON:  None. FINDINGS: Degenerative changes in the [facility] joint with joint space narrowing and spurring. Glenohumeral joint is maintained. No acute bony abnormality. Specifically, no fracture, subluxation, or dislocation. Soft tissues are intact. IMPRESSION: Mild degenerative changes in the left AC joint. No acute bony abnormality. Electronically Signed   By: Rolm Baptise M.D.   On: 10/08/2017 09:42   Dg Humerus Left  Result Date: 10/08/2017 CLINICAL DATA:  Left shoulder and arm pain.  No known injury. EXAM: LEFT HUMERUS - 2+ VIEW COMPARISON:  Shoulder series performed today FINDINGS: Mild degenerative changes in the left AC joint. No acute bony abnormality. Specifically, no fracture, subluxation, or dislocation. No focal bone lesion. IMPRESSION: No acute bony abnormality. Electronically Signed   By: Rolm Baptise M.D.   On: 10/08/2017 09:42   Ct Angio Chest/abd/pel For Dissection W And/or Wo Contrast  Result Date: 10/08/2017 CLINICAL DATA:  Chest and abdominal pain EXAM: CT ANGIOGRAPHY CHEST, ABDOMEN AND PELVIS TECHNIQUE: Initially, axial CT images were obtained through the chest without intravenous contrast material administration. Multidetector CT imaging through the chest, abdomen and pelvis was performed using the standard protocol during bolus administration of intravenous contrast. Multiplanar reconstructed images and MIPs were obtained and reviewed to evaluate the vascular anatomy. CONTRAST:  157mL ISOVUE-370 IOPAMIDOL (ISOVUE-370) INJECTION 76% COMPARISON:  Chest radiograph October 08, 2017; CT abdomen and pelvis July 18, 2017 FINDINGS: CTA CHEST FINDINGS Cardiovascular: No intramural hematoma is evident on noncontrast enhanced study. There is no appreciable thoracic aortic aneurysm or dissection. The visualized great vessels appear unremarkable. No aneurysm or dissection in these vessels. No evident pulmonary embolus. There is no appreciable pericardial effusion or pericardial  thickening. Mediastinum/Nodes: Thyroid appears normal. There is no appreciable thoracic adenopathy. No evident esophageal lesions. Lungs/Pleura: There are small bullae in the extreme lung apices and medial aspect of the anterior segment of the left upper lobe. There is mild bibasilar atelectasis. No evident edema or consolidation. No pleural effusion or pleural thickening evident. Musculoskeletal: No blastic or lytic bone lesions. No chest wall lesions are evident. Review of  the MIP images confirms the above findings. CTA ABDOMEN AND PELVIS FINDINGS VASCULAR Aorta: There is no abdominal aortic aneurysm or dissection. There is no appreciable atherosclerotic change in the abdominal aorta. Celiac: Celiac artery and its major branches appear widely patent. No aneurysm or dissection. No appreciable atherosclerotic change. SMA: Superior mesenteric artery and its major branches appear widely patent. No appreciable atherosclerosis. No aneurysm or dissection evident. Renals: There is a single renal artery on each side. A small accessory renal artery arises off the proximal aspect of each main renal artery. There is no aneurysm or dissection in either renal artery or branch. No appreciable atherosclerosis. No fibromuscular dysplasia. No evident vasculitis. IMA: Inferior mesenteric artery and its branches are widely patent without appreciable atherosclerosis. No aneurysm or dissection. Inflow: Pelvic arterial vessels appear widely patent. No aneurysm or dissection. No appreciable atherosclerotic irregularity. Proximal superficial and profunda femoral arteries also are widely patent. Veins: Previous CT showed narrowing of the left common femoral vein, a finding that appears chronic. There is chronic edema in the right thigh which appears stable and may be secondary to chronic venous stasis phenomenon on the right. Elsewhere, visualized venous structures appear grossly normal. Note that this study was performed to optimize  arterial phase evaluation is opposed to venous phase evaluation. Review of the MIP images confirms the above findings. NON-VASCULAR Hepatobiliary: No focal liver lesions are evident. Gallbladder wall is not appreciably thickened. There is no biliary duct dilatation. Pancreas: No pancreatic mass or inflammatory focus. Spleen: No splenic lesions are evident. There is a tiny accessory spleen superior to the spleen. Adrenals/Urinary Tract: Adrenals bilaterally appear normal. Kidneys bilaterally show no evident hydronephrosis. There is a stable cyst in the lateral aspect of the mid right kidney measuring 9 x 7 mm. There is no evident renal or ureteral calculus on either side. Urinary bladder is midline with wall thickness within normal limits. Stomach/Bowel: There is no evident bowel wall or mesenteric thickening. No evident bowel obstruction. No free air or portal venous air. Lymphatic: There are prominent right inguinal region lymph nodes, largest measuring 2.1 x 1.9 cm. This lymph node prominence was present previously and may be secondary to the venous stasis phenomenon with edema in the right thigh region. Apparent apart from enlarged right inguinal and upper thigh region lymph nodes, there is no appreciable adenopathy in the abdomen or pelvis. There are a few subcentimeter mesenteric lymph nodes, regarded as nonspecific. Reproductive: Prostate and seminal vesicles are normal in size and contour. There are a few tiny prostatic calculi. No pelvic mass evident. Other: No abscess or ascites is evident in the abdomen or pelvis. Appendix appears normal. Musculoskeletal: No evident blastic or lytic bone lesions. No intramuscular lesions are evident. Review of the MIP images confirms the above findings. IMPRESSION: CT angiogram chest: 1. No thoracic aortic aneurysm or dissection. Visualized great vessels appear normal. No pulmonary embolus evident. 2.  No edema or consolidation.  Mild bibasilar atelectasis. 3.  No evident  adenopathy. CT angiogram abdomen; CT angiogram pelvis: 1. Chronic edema and stranding in the right thigh region, likely due to venous stasis phenomenon. There is noted to be narrowing of the right common femoral vein, better seen on prior CT examination which better opacified venous structures. Lymph node prominence in the right upper thigh and right inguinal regions is likely due to stasis phenomenon. 2. No arterial aneurysm or dissection in the aorta or major pelvic and mesenteric arterial vessels. No appreciable atherosclerotic changes in these vessels. 3. No  bowel wall thickening or bowel obstruction. No abscess. Appendix appears normal. 4. No renal or ureteral calculus. No hydronephrosis. There are a few small prostatic calculi. Electronically Signed   By: Lowella Grip III M.D.   On: 10/08/2017 08:15    Procedures Procedures (including critical care time)  Medications Ordered in ED Medications  iopamidol (ISOVUE-370) 76 % injection (has no administration in time range)  potassium chloride SA (K-DUR,KLOR-CON) CR tablet 40 mEq (has no administration in time range)  traMADol (ULTRAM) tablet 50 mg (has no administration in time range)  diclofenac sodium (VOLTAREN) 1 % transdermal gel 2 g (has no administration in time range)  enoxaparin (LOVENOX) injection 40 mg (has no administration in time range)  acetaminophen (TYLENOL) tablet 650 mg (has no administration in time range)    Or  acetaminophen (TYLENOL) suppository 650 mg (has no administration in time range)  ceFAZolin (ANCEF) IVPB 1 g/50 mL premix (has no administration in time range)  aspirin chewable tablet 324 mg (324 mg Oral Given 10/08/17 0845)  iopamidol (ISOVUE-370) 76 % injection 100 mL (100 mLs Intravenous Contrast Given 10/08/17 0738)  fentaNYL (SUBLIMAZE) injection 50 mcg (50 mcg Intravenous Given 10/08/17 0847)  clindamycin (CLEOCIN) IVPB 600 mg (0 mg Intravenous Stopped 10/08/17 1338)  potassium chloride 10 mEq in 100 mL IVPB  (0 mEq Intravenous Stopped 10/08/17 1338)     Initial Impression / Assessment and Plan / ED Course  I have reviewed the triage vital signs and the nursing notes.  Pertinent labs & imaging results that were available during my care of the patient were reviewed by me and considered in my medical decision making (see chart for details).     47 year old male who presents for evaluation of multiple complaints, including chest pain, nausea/vomiting, left upper extremity pain, right lower extremity pain, redness, warmth.  States that he thinks symptoms began a few days ago.  Emesis is nonbloody, nonbilious.  Reports no preceding trauma, injury to left upper extremity.  States that he can barely move the left upper extremity secondary to pain.  Denies any numbness/weakness.  Patient has chronic lymphedema of right lower extremity and states that he is prone to cellulitis of the leg.  Patient was recently seen in January 2019 and treated with oral Keflex for cellulitis.  Patient states that over the last 2 days, the lower leg has become more warm and hot to touch.  Unsure if fevers.  Patient is afebrile here in the ED.  Vital signs stable and reviewed.  Hepatic function panel shows bili at 1.3.  Albumin is 2.4.  BMP shows hypokalemia at 2.9.  Likely from profuse vomiting over the last few days.  CBC shows leukocytosis of 13.1.  Patient's most recent CBC showed a WBC of 4.8.  UDS is negative.  Troponin I was elevated at 1.31.  I-STAT troponin was negative.  Will plan to repeat troponin.  Patient is very uncooperative during exam of right upper extremity.  He does have good pulses and good distal cap refill but states that the pain in his chest and shoulder are connected. Discussed patient with Dr. Wyvonnia Dusky.  Given concerns, will plan for CTA chest for evaluation of dissection.  CT of chest negative for any acute abnormality.  Chest x-ray negative for any acute abnormality.  X-ray of shoulder and humerus negative  for any acute fracture dislocation.  Patient still having chest pain.  No vomiting since being here in the ED.  Given concerns that patient did have  an elevated troponin and is still actively having chest pain, will likely need serial troponins.  Additionally, will probably need observation regarding cellulitis of right lower extremity.  IV antibiotics initiated.  Discussed with internal medicine.  They agreed to admission.  Final Clinical Impressions(s) / ED Diagnoses   Final diagnoses:  Cellulitis of right leg  Chest pain, unspecified type  Nausea and vomiting, intractability of vomiting not specified, unspecified vomiting type  Acute pain of left shoulder  Hypokalemia    ED Discharge Orders    None       Desma Mcgregor 10/08/17 1432    Ezequiel Essex, MD 10/08/17 1902

## 2017-10-08 NOTE — ED Notes (Signed)
Patient transported to X-ray 

## 2017-10-08 NOTE — H&P (Addendum)
Date: 10/08/2017               Patient Name:  Jonathan Meadows MRN: [id number]  DOB: April 23, 1971 Age / Sex: 47 y.o., male   PCP: Patient, No Pcp Per         Medical Service: Internal Medicine Teaching Service         Attending Physician: Dr. Evette Doffing, Mallie Mussel, *    First Contact: Dr. Shan Levans Pager: [phone]  Second Contact: Dr. Heber Gaffney Pager: [phone]       After Hours (After 5p/  First Contact Pager: [phone]  weekends / holidays): Second Contact Pager: [phone]   Chief Complaint: CP, N/V, RLE erythema/ warmth  History of Present Illness: She is a 47 year old male with a past medical history of paranoid schizophrenia, lymphedema of the right lower extremity presenting to the hospital with a 3-day history of constant substernal chest pressure at rest.  Also reports having associated left shoulder and arm pain, shortness of breath, nausea, vomiting, diaphoresis, and chills.  Denies any history of injury to his chest or shoulder.  States the nausea and vomiting resolved 2 days ago but he continues to have chest pressure.  Reports having a fever 2 days ago; not sure what his temperature was at that time.  Denies any recent sick contacts and he lives independently.  States he has had right lower extremity lymphedema for the past 4-5 years but noticed since he has had chest pain for the past 3 days his right lower extremity is more red and warm.  No other complaints.  On arrival, slightly tachycardic and tachypneic.  Blood pressure stable.  SPO2 97-100% on room air.  Labs showing potassium 2.9.  White blood cell count 13.1.  Troponin-I 1.34.  Repeat i-STAT troponin and troponin-I 0.0.  EKG without acute ischemic changes.  UDS negative.  LFTs normal.  T bili borderline elevated at 1.3.  Lipase negative.  Chest x-ray without any acute findings.  CTA chest/abdomen/pelvis done in the ED negative for aortic dissection or aneurysm.  In addition, no PE and no evidence of edema or consolidation in the  lungs.  X-ray of left shoulder showing mild degenerative changes in the [facility] joint.  X-ray of left humerus without any acute abnormality.  He received a dose of clindamycin in the ED for right lower extremity cellulitis.   Meds:  Current Meds  Medication Sig  . benzonatate (TESSALON) 100 MG capsule Take 1 capsule (100 mg total) by mouth 3 (three) times daily as needed for cough.  . furosemide (LASIX) 20 MG tablet Take 20 mg by mouth daily.  Marland Kitchen loperamide (IMODIUM) 2 MG capsule Take 1 capsule (2 mg total) by mouth 4 (four) times daily as needed for diarrhea or loose stools.  . traMADol (ULTRAM) 50 MG tablet Take 1 tablet (50 mg total) by mouth every 12 (twelve) hours as needed. (Patient taking differently: Take 50 mg by mouth every 12 (twelve) hours as needed for moderate pain. )     Allergies: Allergies as of 10/08/2017 - Review Complete 10/08/2017  Allergen Reaction Noted  . Pork-derived products Nausea And Vomiting 10/08/2017   Past Medical History:  Diagnosis Date  . Anemia   . Cellulitis   . Homelessness   . Lymphedema of leg   . Paranoid schizophrenia ([place])     Family History: Denies having any family history of medical conditions.  Social History: Denies any tobacco, ethanol, or drug use.  Review of Systems: A complete  ROS was negative except as per HPI.  Physical Exam: Blood pressure 112/75, pulse 97, temperature 98.3 F (36.8 C), resp. rate (!) 27, SpO2 98 %. Physical Exam  Constitutional: He is oriented to person, place, and time. He appears well-developed and well-nourished. No distress.  HENT:  Mouth/Throat: Oropharynx is clear and moist.  Eyes: Right eye exhibits no discharge. Left eye exhibits no discharge.  Cardiovascular: Normal rate, regular rhythm and intact distal pulses.  Pulmonary/Chest: Effort normal and breath sounds normal. No respiratory distress. He has no wheezes. He has no rales.  Chest wall tender to palpation even to light touch.  Chest pain easily  reproducible on exam.  Abdominal: Soft. Bowel sounds are normal. He exhibits no distension. There is no tenderness.  Musculoskeletal: He exhibits edema.  Right lower extremity: +2 edema Left shoulder and arm: No obvious deformity.  Unable to assess range of motion due to lack of patient cooperation.  Neurological: He is alert and oriented to person, place, and time.  Skin: Skin is warm and dry.  Dorsum of his right foot appears erythematous  Psychiatric:  Appears agitated   EKG: personally reviewed my interpretation is normal sinus rhythm, right bundle branch block. Partial RBBB on his prior EKG.   CXR: personally reviewed my interpretation is no acute edema or consolidation.  Assessment & Plan by Problem: Active Problems:   Chest pain  47 year old male with a past medical history of paranoid schizophrenia, lymphedema of the right lower extremity presenting to the hospital with a 3-day history of constant substernal chest pressure at rest associated with left shoulder and arm pain, shortness of breath, nausea, vomiting, diaphoresis, and chills.  Also reports having erythema and warmth of his right lower extremity.   Chest pain: Likely musculoskeletal in nature. He has no documented history of coronary artery disease. Risk factors include gender and age.  Chest wall tender to palpation even on light touch and chest pain is easily reproducible on exam.  He is hemodynamically stable. Troponin 1.34 >0 > 0.05.  EKG without acute ischemic changes.  Low suspicion for ACS at this time. UDS negative.  Chest x-ray without acute findings. CTA chest/abdomen/pelvis negative for aortic dissection or aneurysm.  In addition, no PE and no evidence of edema or consolidation in the lungs. -Monitor on telemetry -Trend troponin -Voltaren gel -Tylenol PRN -Continue home tramadol 50 mg twice daily as needed  Right lower extremity nonpurulent cellulitis: He has a history of chronic lymphedema of the right lower  extremity.  The dorsum of the foot appears erythematous.  He does have mild leukocytosis with white count 13.1 and reports having fever at home. -Cefazolin -CBC in a.m.  Left shoulder and arm pain: X-ray of left shoulder showing mild degenerative changes in the [facility] joint.  X-ray of left humerus without any acute abnormality.  Unclear etiology of his shoulder and arm pain.  Patient denies any injury to the area.  Unable to assess range of motion due to lack of patient cooperation. -Voltaren gel -May consider MRI to assess for rotator cuff tear if no clinical improvement  Chronic lymphedema right lower extremity: Per PCP note, he is followed by vascular surgery at [facility].  His last appointment was supposed to be October 01, 2017.  I do not see any records in epic.  Last PCP visit on September 23, 2017 at [facility]. -Outpatient PCP and vascular surgery follow-up  Hypokalemia: In the setting of home Lasix use.  Patient  reports running out of potassium supplement a few days ago.  Labs showing potassium 2.9. Repeat K 3.0 after repletion.  No acute EKG changes. -Continue to monitor BMP and replete potassium as needed -Hold home Lasix  Paranoid schizophrenia: Not on any home medications. -Continue to monitor -PCP follow-up after discharge  DVT prophylaxis: Lovenox  Diet: Heart healthy  CODE STATUS: Patient wishes to be full code.  Dispo: Admit patient to Observation with expected length of stay less than 2 midnights.  Signed: Shela Leff, MD 10/08/2017, 11:01 AM  Pager: [phone]

## 2017-10-08 NOTE — ED Notes (Signed)
Patient is aware he needs urine spec.

## 2017-10-08 NOTE — ED Notes (Signed)
Patient presents to ed c/o chest pain and left arm pain x several days states he can't move his left arm, denies injury, when staff tries to move his arm co severe pain in humerus area and shoulder. States he has been without his medications for several days.

## 2017-10-08 NOTE — ED Triage Notes (Signed)
See downtime  

## 2017-10-09 ENCOUNTER — Inpatient Hospital Stay (HOSPITAL_COMMUNITY): Payer: Medicaid Other

## 2017-10-09 ENCOUNTER — Other Ambulatory Visit: Payer: Self-pay

## 2017-10-09 ENCOUNTER — Observation Stay (HOSPITAL_COMMUNITY): Payer: Medicaid Other

## 2017-10-09 ENCOUNTER — Observation Stay (HOSPITAL_BASED_OUTPATIENT_CLINIC_OR_DEPARTMENT_OTHER): Payer: Medicaid Other

## 2017-10-09 ENCOUNTER — Encounter (HOSPITAL_COMMUNITY): Payer: Self-pay

## 2017-10-09 DIAGNOSIS — I872 Venous insufficiency (chronic) (peripheral): Secondary | ICD-10-CM | POA: Diagnosis not present

## 2017-10-09 DIAGNOSIS — M25412 Effusion, left shoulder: Secondary | ICD-10-CM | POA: Diagnosis present

## 2017-10-09 DIAGNOSIS — M7502 Adhesive capsulitis of left shoulder: Secondary | ICD-10-CM | POA: Diagnosis present

## 2017-10-09 DIAGNOSIS — M009 Pyogenic arthritis, unspecified: Secondary | ICD-10-CM | POA: Diagnosis present

## 2017-10-09 DIAGNOSIS — E876 Hypokalemia: Secondary | ICD-10-CM | POA: Diagnosis present

## 2017-10-09 DIAGNOSIS — F329 Major depressive disorder, single episode, unspecified: Secondary | ICD-10-CM | POA: Diagnosis not present

## 2017-10-09 DIAGNOSIS — R609 Edema, unspecified: Secondary | ICD-10-CM | POA: Diagnosis not present

## 2017-10-09 DIAGNOSIS — F200 Paranoid schizophrenia: Secondary | ICD-10-CM | POA: Diagnosis present

## 2017-10-09 DIAGNOSIS — L02414 Cutaneous abscess of left upper limb: Secondary | ICD-10-CM | POA: Diagnosis present

## 2017-10-09 DIAGNOSIS — R079 Chest pain, unspecified: Secondary | ICD-10-CM

## 2017-10-09 DIAGNOSIS — R51 Headache: Secondary | ICD-10-CM | POA: Diagnosis not present

## 2017-10-09 DIAGNOSIS — R509 Fever, unspecified: Secondary | ICD-10-CM | POA: Diagnosis not present

## 2017-10-09 DIAGNOSIS — Z91018 Allergy to other foods: Secondary | ICD-10-CM | POA: Diagnosis not present

## 2017-10-09 DIAGNOSIS — I361 Nonrheumatic tricuspid (valve) insufficiency: Secondary | ICD-10-CM

## 2017-10-09 DIAGNOSIS — Z79899 Other long term (current) drug therapy: Secondary | ICD-10-CM | POA: Diagnosis not present

## 2017-10-09 DIAGNOSIS — I890 Lymphedema, not elsewhere classified: Secondary | ICD-10-CM | POA: Diagnosis present

## 2017-10-09 DIAGNOSIS — R5383 Other fatigue: Secondary | ICD-10-CM | POA: Diagnosis not present

## 2017-10-09 DIAGNOSIS — L03115 Cellulitis of right lower limb: Secondary | ICD-10-CM | POA: Diagnosis present

## 2017-10-09 DIAGNOSIS — M00812 Arthritis due to other bacteria, left shoulder: Secondary | ICD-10-CM | POA: Diagnosis not present

## 2017-10-09 DIAGNOSIS — R7303 Prediabetes: Secondary | ICD-10-CM | POA: Diagnosis not present

## 2017-10-09 DIAGNOSIS — R683 Clubbing of fingers: Secondary | ICD-10-CM | POA: Diagnosis not present

## 2017-10-09 DIAGNOSIS — R5381 Other malaise: Secondary | ICD-10-CM | POA: Diagnosis not present

## 2017-10-09 DIAGNOSIS — F1411 Cocaine abuse, in remission: Secondary | ICD-10-CM | POA: Diagnosis not present

## 2017-10-09 DIAGNOSIS — R0789 Other chest pain: Secondary | ICD-10-CM | POA: Diagnosis present

## 2017-10-09 DIAGNOSIS — F419 Anxiety disorder, unspecified: Secondary | ICD-10-CM | POA: Diagnosis not present

## 2017-10-09 DIAGNOSIS — R450 Nervousness: Secondary | ICD-10-CM | POA: Diagnosis not present

## 2017-10-09 DIAGNOSIS — M25512 Pain in left shoulder: Secondary | ICD-10-CM | POA: Diagnosis present

## 2017-10-09 DIAGNOSIS — Q820 Hereditary lymphedema: Secondary | ICD-10-CM | POA: Diagnosis not present

## 2017-10-09 LAB — CBC
HCT: 33 % — ABNORMAL LOW (ref 39.0–52.0)
HEMOGLOBIN: 11.2 g/dL — AB (ref 13.0–17.0)
MCH: 30.5 pg (ref 26.0–34.0)
MCHC: 33.9 g/dL (ref 30.0–36.0)
MCV: 89.9 fL (ref 78.0–100.0)
PLATELETS: 251 10*3/uL (ref 150–400)
RBC: 3.67 MIL/uL — AB (ref 4.22–5.81)
RDW: 12.8 % (ref 11.5–15.5)
WBC: 10 10*3/uL (ref 4.0–10.5)

## 2017-10-09 LAB — BASIC METABOLIC PANEL
ANION GAP: 9 (ref 5–15)
BUN: 9 mg/dL (ref 6–20)
CALCIUM: 7.8 mg/dL — AB (ref 8.9–10.3)
CO2: 25 mmol/L (ref 22–32)
CREATININE: 1.03 mg/dL (ref 0.61–1.24)
Chloride: 99 mmol/L — ABNORMAL LOW (ref 101–111)
Glucose, Bld: 130 mg/dL — ABNORMAL HIGH (ref 65–99)
Potassium: 3.4 mmol/L — ABNORMAL LOW (ref 3.5–5.1)
SODIUM: 133 mmol/L — AB (ref 135–145)

## 2017-10-09 LAB — HIV ANTIBODY (ROUTINE TESTING W REFLEX): HIV Screen 4th Generation wRfx: NONREACTIVE

## 2017-10-09 LAB — ECHOCARDIOGRAM COMPLETE
HEIGHTINCHES: 73 in
WEIGHTICAEL: 3438.4 [oz_av]

## 2017-10-09 LAB — TROPONIN I: TROPONIN I: 0.06 ng/mL — AB (ref <0.03)

## 2017-10-09 LAB — MAGNESIUM: MAGNESIUM: 2.4 mg/dL (ref 1.7–2.4)

## 2017-10-09 MED ORDER — POTASSIUM CHLORIDE CRYS ER 20 MEQ PO TBCR
40.0000 meq | EXTENDED_RELEASE_TABLET | Freq: Once | ORAL | Status: AC
  Administered 2017-10-09: 40 meq via ORAL
  Filled 2017-10-09: qty 2

## 2017-10-09 NOTE — Consult Note (Signed)
CARDIOLOGY CONSULT  Physician Requesting Consult:  Axel Filler, MD  HPI:  Jonathan Meadows is a 47 y.o. old male with history of paranoid schizophrenia, lymphedema of the right lower extremity who presents to the ED with 3-4 day history of substernal chest pain.  He is a difficult historian with a multitude of complaints.  When asked about his chest pain he states this occurs with deep breathing or with attempting to sit up.  There is also pain with palpation of the chest just right of the midline.  He has no previous cardiac history.  Difficult to determine if he has pain without movement.  On presentation to the ED initial troponin was elevated at 1.34.  The next series of troponins have been rather erratic at <0.03->0.05->0.15 and now <0.03.    Assessment/Plan Non-cardiac chest pain   Assessment:  The chest pain by history is very atypical in nature and more consistent with MSK origin.  The troponin level is very erratic in it's measurements, but is certainly not consistent with an acute ACS pattern.  Would stop checking troponins.  No need for ACS management.  Would treat his other issues and follow his chest pain clinically.   Plan  -  Continue current medical management  -  ECHO  -  Stop checking troponin  Past Cardiovascular History:  - No documented h/o CAD - No documented h/o MI - No documented h/o CHF - No documented h/o PVD - No documented h/o AAA - No documented h/o valvular heart disease - No documented h/o CVA - No documented h/o Arrhythmias - No documented h/o A-fib  - No documented h/o congenital heart disease - No documented h/o CABG - No documented h/o PCI - No documented h/o cardiac devices (Pacer/ICD/CRT) - No documented h/o cardiac surgery       Most recent stress test:  None  Most recent echocardiography:  None  Most recent left heart catheterization:  None  CABG:  Date/ Physician: None  Device history:  None  Past Medical History:  Diagnosis  Date  . Anemia   . Cellulitis   . Homelessness   . Lymphedema of leg   . Paranoid schizophrenia ([place])     No past surgical history on file.  Social History   Socioeconomic History  . Marital status: Single    Spouse name: Not on file  . Number of children: Not on file  . Years of education: Not on file  . Highest education level: Not on file  Occupational History  . Not on file  Social Needs  . Financial resource strain: Not on file  . Food insecurity:    Worry: Not on file    Inability: Not on file  . Transportation needs:    Medical: Not on file    Non-medical: Not on file  Tobacco Use  . Smoking status: Never Smoker  . Smokeless tobacco: Never Used  Substance and Sexual Activity  . Alcohol use: No  . Drug use: No  . Sexual activity: Not on file  Lifestyle  . Physical activity:    Days per week: Not on file    Minutes per session: Not on file  . Stress: Not on file  Relationships  . Social connections:    Talks on phone: Not on file    Gets together: Not on file    Attends religious service: Not on file    Active member of club or organization: Not on file  Attends meetings of clubs or organizations: Not on file    Relationship status: Not on file  . Intimate partner violence:    Fear of current or ex partner: Not on file    Emotionally abused: Not on file    Physically abused: Not on file    Forced sexual activity: Not on file  Other Topics Concern  . Not on file  Social History Narrative  . Not on file    Family History  Problem Relation Age of Onset  . CAD Unknown        Neg HX  . Hypertension Unknown        Neg Hx  . Diabetes Unknown        Neg Hx  . Cancer Unknown        Neg Hx     Intake/Output Summary (Last 24 hours) at 10/09/2017 0026 Last data filed at 10/08/2017 2100 Gross per 24 hour  Intake 480 ml  Output 875 ml  Net -395 ml    MEDS:  ceFAZolin (ANCEF) IV Last Rate: Stopped (10/08/17 2236)    diclofenac sodium 2 g QID    enoxaparin (LOVENOX) injection 40 mg Q24H  potassium chloride 40 mEq Q4H  ramelteon 8 mg QHS    Review of Systems:  GEN: no fever, chills, nausea, vomiting, weight change  HEENT: no vision or hearing changes  PULM: no coughing, SOB  CV: +chest pain, palpitations, PND, orthopnea  GI: no abdominal pain  GU: no dysuria  EXT: no swelling  SKIN: no rashes  NEURO: no numbness or tingling, +weakness in the left arm HEME: no bleeding or bruising  GYN: none  --12 point review systems- otherwise negative.  Physical Examination: Blood pressure 114/78, pulse 100, temperature 98.5 F (36.9 C), temperature source Oral, resp. rate 19, height 6\' 1"  (1.854 m), weight 97.9 kg (215 lb 13.3 oz), SpO2 100 %. General:  AAOX 4.  NAD.  NRD.  HENT: Normocephalic. Atraumatic.  No acute abnom. EYES: PERRL EOMI  Neck: Supple.  No JVD.  No bruits. Cardiovascular:  Nl S1. Nl S2. No S3. No S4. Nl PMI. No m/r/c. RRR  Pulmonary/Chest: CTA B. No rales. No wheezing.  Abdomen: Soft, NT, no masses, no organomegaly. Neuro: CN intact, no spontaneous movement of the left arm Ext: Warm. 2+ edema right lower extremity.  Tender to palpation of the chest SKIN- intact  Recent Labs    10/08/17 0330 10/08/17 1738 10/08/17 2049  HGB 13.2  --   --   HCT 37.2*  --   --   WBC 13.1*  --   --   BUN 15 8 7   CREATININE 1.02 1.05 0.96  GLUCOSE 118* 161* 138*  CALCIUM 8.3* 7.6* 7.7*    Discuss the benefits and adverse side affects of the medications use.  Discuss the benefits and adverse side affects of the required study.  Discuss the risk and benefits of ambulation during hospitalization.   Baruch Merl, MD, PhD Cardiology

## 2017-10-09 NOTE — Plan of Care (Signed)
  Problem: Nutrition: Goal: Adequate nutrition will be maintained Outcome: Completed/Met

## 2017-10-09 NOTE — Progress Notes (Addendum)
   Subjective:  Jonathan Meadows is still complaining of multiple sites of musculoskeletal pain along his anterior chest wall, also in his left shoulder.    Objective:  Vital signs in last 24 hours: Vitals:   10/08/17 2125 10/09/17 0549 10/09/17 1040 10/09/17 1524  BP: 114/78 109/70 103/73 110/64  Pulse: 100 96  (!) 102  Resp: 19 13  18   Temp: 98.5 F (36.9 C) 98.2 F (36.8 C)  99.6 F (37.6 C)  TempSrc: Oral Oral  Oral  SpO2: 100% 100%  97%  Weight:  214 lb 14.4 oz (97.5 kg)    Height:       Physical Exam  Constitutional: He is oriented to person, place, and time. He appears well-developed and well-nourished. No distress.  HENT:  Mouth/Throat: Oropharynx is clear and moist.  Eyes: Right eye exhibits no discharge. Left eye exhibits no discharge.  Cardiovascular: Normal rate, regular rhythm and intact distal pulses.  Pulmonary/Chest: Effort normal and breath sounds normal. No respiratory distress. He has no wheezes. He has no rales.  Chest wall tender to palpation even to light touch.  Chest pain easily reproducible on exam.  Abdominal: Soft. Bowel sounds are normal. He exhibits no distension. There is no tenderness.  Musculoskeletal:  Right lower extremity: 2+ edema Left shoulder: pain with passive range of motion, good strength and sensation but limited by pain.  Arm is edematous Clubbing noted  Neurological: He is alert and oriented to person, place, and time.  Skin: Skin is warm and dry.  R leg edematous, is warm but does not appear grossly infected      Assessment/Plan:  Active Problems:   Chest pain   Cellulitis  Chest pain: Likely musculoskeletal in nature. Reproducible, pleuritic, worse with coughing, cardiology was consulted overnight due to spurious troponin results with elevations, recommended to no longer trend  -Monitor on telemetry -Voltaren gel -Tylenol PRN -Continue home tramadol 50 mg twice daily as needed -ECHO shows EF 55-60%, G1DD   Right lower  extremity nonpurulent cellulitis: He has a history of chronic lymphedema of the right lower extremity.  The dorsum of the foot appears erythematous.  He does have mild leukocytosis with white count 13.1 and reports having fever at home.  -Cefazolin -CBC's   Left shoulder and arm pain: X-ray of left shoulder showing mild degenerative changes in the [facility] joint. Physical exam limited by pts pain  -Voltaren gel -Ordered MRI neck and shoulder for further evaluation -LUE venous duplex   Chronic lymphedema right lower extremity: pt followed by vascular surgery at [facility].    -will need follow up appointment   Hypokalemia: In the setting of home Lasix use.   -Continue to monitor BMP and replete potassium as needed -Hold home Lasix   Paranoid schizophrenia: Not on any home medications.  -Continue to monitor -PCP follow-up after discharge   Dispo: Anticipated discharge in approximately 2-4 day(s).   Katherine Roan, MD 10/09/2017, 4:01 PM Vickki Muff MD PGY-1 Internal Medicine Pager # [phone]

## 2017-10-09 NOTE — Progress Notes (Signed)
  Echocardiogram 2D Echocardiogram has been performed.  Lonzo Saulter L Androw 10/09/2017, 3:14 PM

## 2017-10-09 NOTE — Discharge Summary (Signed)
Name: Jonathan Meadows MRN: [id number] DOB: [birth year] 47 y.o. PCP: Patient, No Pcp Per  Date of Admission: 10/08/2017  5:38 AM Date of Discharge: 10/13/2017 Attending Physician: Aldine Contes  Discharge Diagnosis:  Principal Problem:   Effusion of left shoulder joint Active Problems:   Chest pain   Cellulitis   Adhesive capsulitis of left shoulder   Impingement syndrome of left shoulder Left Glenohumeral Septic arthritis  Discharge Medications: Allergies as of 10/13/2017      Reactions   Pork-derived Products Nausea And Vomiting      Medication List    TAKE these medications   benzonatate 100 MG capsule Commonly known as:  TESSALON Take 1 capsule (100 mg total) by mouth 3 (three) times daily as needed for cough.   furosemide 20 MG tablet Commonly known as:  LASIX Take 20 mg by mouth daily.   linezolid 600 MG tablet Commonly known as:  ZYVOX Take 1 tablet (600 mg total) by mouth 2 (two) times daily for 27 days.   loperamide 2 MG capsule Commonly known as:  IMODIUM Take 1 capsule (2 mg total) by mouth 4 (four) times daily as needed for diarrhea or loose stools.   oxyCODONE-acetaminophen 10-325 MG tablet Commonly known as:  PERCOCET Take 1 tablet by mouth every 6 (six) hours as needed for up to 5 days for pain.   traMADol 50 MG tablet Commonly known as:  ULTRAM Take 1 tablet (50 mg total) by mouth every 12 (twelve) hours as needed. What changed:  reasons to take this       Disposition and follow-up:   Mr.Jonathan Meadows was discharged from [facility] in stable condition.  At the hospital follow up visit please address:  Left Glenohumeral Septic arthritis  -check cbc as pt on linezolid therapy -follow up appointment with Dr. Sharol Given -follow up appointment with ID clinic   2.  Labs / imaging needed at time of follow-up: cbc, bmp  3.  Pending labs/ test needing follow-up: none fluid culture negative for growth  Follow-up  Appointments: Follow-up Information    Newt Minion, MD In 2 weeks.   Specialty:  Orthopedic Surgery Contact information: [place] [address] [phone]        [place], [place]. Go on 10/26/2017.   Specialty:  Internal Medicine Why:  @ 2:30 pm for Hospital Follow Up with MD Mccann. Please call office if you need to reschedule. Contact information: [place] [address] [phone]        Department of Social Services. Call.   Why:  Please call and speak with Case Worker in [place] to get new Medicaid Card Issued to reflect that PCP is at [facility]. (Case Worker can assist with [employer] as well)  Contact information: [place] [address] [phone]       Tommy Medal, Lavell Islam, MD Follow up in 1 week(s).   Specialty:  Infectious Diseases Why:  please follow up with infectious disease clinic in 7-10 days Contact information: [address] [address] [phone]           Hospital Course by problem list: Principal Problem:   Effusion of left shoulder joint Active Problems:   Chest pain   Cellulitis   Adhesive capsulitis of left shoulder   Impingement syndrome of left shoulder  Left Glenohumeral Septic arthritis  Patient arrived with left shoulder and pain and arm swelling.  Additionally, he was started on vancomycin for presumed right lower extremity cellulitis, patient has chronic right lower extremity venous stasis due to a narrow right femoral vein.  He additionally complained of some chest discomfort that was reproducible.  His ACS workup was negative other than spurious troponin results, cardiology was consulted they felt that these random elevations were mild and unrelated to a cardiac process.  Recommended ceasing with further troponin labs.  On further investigation of the shoulder and arm he had very limited range of motion due to  pain he was had point tenderness in the glenohumeral joint.  An MRI was ordered which showed an effusion within the glenohumeral joint and some cortical degeneration of the joint itself.  A upper extremity duplex ultrasound was performed which showed no evidence of DVT.  Due to the effusion the joint was aspirated and sent for culture and Gram stain.  The Gram stain was positive for gram-positive cocci.  Orthopedic surgery was consulted and the patient was taken to the OR for arthroscopy with washout.  Patient was started on linezolid to finish a month course and to follow with infectious disease clinic for a possible 2-week course of another antibiotic.  Additionally, the patient was sent home with home health PT to help with his left shoulder and also his right lower extremity venous stasis in order to incorporate a compression device.     Discharge Vitals:   BP 114/74 (BP Location: Right Arm)   Pulse 99   Temp 98.6 F (37 C) (Oral)   Resp 18   Ht 6\' 1"  (1.854 m)   Wt 221 lb 3.2 oz (100.3 kg)   SpO2 98%   BMI 29.18 kg/m   Pertinent Labs, Studies, and Procedures:  CBC Latest Ref Rng & Units 10/12/2017 10/10/2017 10/09/2017  WBC 4.0 - 10.5 K/uL 18.1(H) 11.7(H) 10.0  Hemoglobin 13.0 - 17.0 g/dL 10.8(L) 10.9(L) 11.2(L)  Hematocrit 39.0 - 52.0 % 31.8(L) 32.6(L) 33.0(L)  Platelets 150 - 400 K/uL 488(H) 318 251   BMP Latest Ref Rng & Units 10/12/2017 10/10/2017 10/09/2017  Glucose 65 - 99 mg/dL 138(H) 132(H) 130(H)  BUN 6 - 20 mg/dL 15 14 9   Creatinine 0.61 - 1.24 mg/dL 1.07 0.97 1.03  Sodium 135 - 145 mmol/L 134(L) 134(L) 133(L)  Potassium 3.5 - 5.1 mmol/L 4.7 3.7 3.4(L)  Chloride 101 - 111 mmol/L 99(L) 99(L) 99(L)  CO2 22 - 32 mmol/L 27 26 25   Calcium 8.9 - 10.3 mg/dL 8.1(L) 7.8(L) 7.8(L)    Component 5d ago Specimen Description JOINT FLUID LEFT SHOULDER Special Requests NONE Gram Stain  ABUNDANT WBC PRESENT, PREDOMINANTLY PMN  RARE GRAM POSITIVE COCCI IN PAIRS IN CLUSTERS   Culture  NO  GROWTH 3 DAYS  Performed at [facility], [address], [place], [place] [address]   Report Status 10/14/2017 FINAL  Component 4d ago Specimen Description SYNOVIAL FLUID LEFT SHOULDER Special Requests NONE Gram Stain  FEW WBC PRESENT,BOTH PMN AND MONONUCLEAR  NO ORGANISMS SEEN   Culture  NO GROWTH 4 DAYS NO ANAEROBES ISOLATED; CULTURE IN PROGRESS FOR 5 DAYS  Performed at [facility], [place] [address] [place], [place] [address]   Report Status PENDING Resulting Agency [facility]   Discharge Instructions: Discharge Instructions    Ambulatory referral to Occupational Therapy   Complete by:  As directed    Evaluation and treatment   Call MD for:  difficulty breathing, headache or visual disturbances   Complete by:  As directed    Call MD for:  extreme fatigue   Complete by:  As directed    Call MD for:  hives   Complete by:  As directed    Call MD for:  persistant dizziness or light-headedness   Complete by:  As directed    Call MD for:  persistant nausea and vomiting   Complete by:  As directed    Call MD for:  redness, tenderness, or signs of infection (pain, swelling, redness, odor or green/yellow discharge around incision site)   Complete by:  As directed    Call MD for:  severe uncontrolled pain   Complete by:  As directed    Call MD for:  temperature >100.4   Complete by:  As directed    Diet - low sodium heart healthy   Complete by:  As directed    Discharge instructions   Complete by:  As directed    Mr. Rosalio Loud doing much better.  We found out you had an infected shoulder joint and are treating you with antibiotics.  The antibiotics for this infection can cause a decrease in your platelet count.  It will be important to make your follow up appointments listed in your paperwork and get that count checked in the next 7-10 days.  Please follow up with infectious disease to help you further manage this joint infection.  Additionally, you have an appointment  with Dr. Sharol Given.  I have placed an order for home health PT to help with your shoulder and right leg venous stasis.   Increase activity slowly   Complete by:  As directed       Signed: Katherine Roan, MD 10/15/2017, 6:00 PM

## 2017-10-09 NOTE — Progress Notes (Signed)
LUE venous duplex prelim: no evidence of DVT or SVT. Landry Mellow, RDMS, RVT

## 2017-10-10 DIAGNOSIS — M25412 Effusion, left shoulder: Secondary | ICD-10-CM | POA: Diagnosis not present

## 2017-10-10 LAB — SYNOVIAL CELL COUNT + DIFF, W/ CRYSTALS
CRYSTALS FLUID: NONE SEEN
Eosinophils-Synovial: 0 % (ref 0–1)
Lymphocytes-Synovial Fld: 2 % (ref 0–20)
Monocyte-Macrophage-Synovial Fluid: 4 % — ABNORMAL LOW (ref 50–90)
Neutrophil, Synovial: 94 % — ABNORMAL HIGH (ref 0–25)
Other Cells-SYN: 0
WBC, Synovial: 166000 /mm3 — ABNORMAL HIGH (ref 0–200)

## 2017-10-10 LAB — BASIC METABOLIC PANEL
Anion gap: 9 (ref 5–15)
BUN: 14 mg/dL (ref 6–20)
CHLORIDE: 99 mmol/L — AB (ref 101–111)
CO2: 26 mmol/L (ref 22–32)
CREATININE: 0.97 mg/dL (ref 0.61–1.24)
Calcium: 7.8 mg/dL — ABNORMAL LOW (ref 8.9–10.3)
GFR calc non Af Amer: >60 mL/min (ref >60)
GLUCOSE: 132 mg/dL — AB (ref 65–99)
Potassium: 3.7 mmol/L (ref 3.5–5.1)
Sodium: 134 mmol/L — ABNORMAL LOW (ref 135–145)

## 2017-10-10 LAB — CBC
HCT: 32.6 % — ABNORMAL LOW (ref 39.0–52.0)
HEMOGLOBIN: 10.9 g/dL — AB (ref 13.0–17.0)
MCH: 30.2 pg (ref 26.0–34.0)
MCHC: 33.4 g/dL (ref 30.0–36.0)
MCV: 90.3 fL (ref 78.0–100.0)
Platelets: 318 10*3/uL (ref 150–400)
RBC: 3.61 MIL/uL — AB (ref 4.22–5.81)
RDW: 12.8 % (ref 11.5–15.5)
WBC: 11.7 10*3/uL — ABNORMAL HIGH (ref 4.0–10.5)

## 2017-10-10 MED ORDER — OXYCODONE HCL 5 MG PO TABS
5.0000 mg | ORAL_TABLET | Freq: Four times a day (QID) | ORAL | Status: DC | PRN
Start: 2017-10-10 — End: 2017-10-10
  Administered 2017-10-10: 5 mg via ORAL
  Filled 2017-10-10: qty 1

## 2017-10-10 MED ORDER — POVIDONE-IODINE 10 % EX SWAB: 2.0000 "application " | Freq: Once | CUTANEOUS | Status: DC

## 2017-10-10 MED ORDER — FUROSEMIDE 20 MG PO TABS
20.0000 mg | ORAL_TABLET | Freq: Every day | ORAL | Status: DC
  Administered 2017-10-10 – 2017-10-13 (×4): 20 mg via ORAL
  Filled 2017-10-10 (×4): qty 1

## 2017-10-10 MED ORDER — CHLORHEXIDINE GLUCONATE 4 % EX LIQD
60.0000 mL | Freq: Once | CUTANEOUS | Status: AC
  Administered 2017-10-10: 4 via TOPICAL
  Filled 2017-10-10: qty 60

## 2017-10-10 MED ORDER — PIPERACILLIN-TAZOBACTAM 3.375 G IVPB
3.3750 g | Freq: Three times a day (TID) | INTRAVENOUS | Status: DC
Start: 2017-10-10 — End: 2017-10-11
  Administered 2017-10-10 – 2017-10-11 (×3): 3.375 g via INTRAVENOUS
  Filled 2017-10-10 (×4): qty 50

## 2017-10-10 MED ORDER — OXYCODONE HCL 5 MG PO TABS
5.0000 mg | ORAL_TABLET | ORAL | Status: DC | PRN
  Administered 2017-10-10 – 2017-10-11 (×3): 5 mg via ORAL
  Filled 2017-10-10 (×3): qty 1

## 2017-10-10 MED ORDER — VANCOMYCIN HCL IN DEXTROSE 1-5 GM/200ML-% IV SOLN
1000.0000 mg | Freq: Three times a day (TID) | INTRAVENOUS | Status: DC
  Administered 2017-10-10 – 2017-10-12 (×6): 1000 mg via INTRAVENOUS
  Filled 2017-10-10 (×7): qty 200

## 2017-10-10 NOTE — Progress Notes (Signed)
Pharmacy Antibiotic Note  Jonathan Meadows is a 47 y.o. male admitted on 10/08/2017 with septic joint.  Pharmacy has been consulted for vancomycin and zosyn dosing. WBC 11.7, afebrile, joint effusion seen on ultrasound. Joint fluid culture was obtained on 4/13. Synovial fluid was cloudy, WBC 166K, with elevated neutrophils.  Plan: Vancomycin 1000mg  Q8hrs Zosyn 3.375g Q8hrs Stopped cefazolin Monitor culture report, clinical status, LOT, and renal function VT as needed  Height: 6\' 1"  (185.4 cm) Weight: 218 lb 8 oz (99.1 kg) IBW/kg (Calculated) : 79.9  Temp (24hrs), Avg:98.8 F (37.1 C), Min:98.6 F (37 C), Max:99.2 F (37.3 C)  Recent Labs  Lab 10/08/17 0330 10/08/17 1738 10/08/17 2049 10/09/17 0527 10/10/17 0435  WBC 13.1*  --   --  10.0 11.7*  CREATININE 1.02 1.05 0.96 1.03 0.97    Estimated Creatinine Clearance: 116.6 mL/min (by C-G formula based on SCr of 0.97 mg/dL).    Antimicrobials this admission: Vanc 4/13>> Zosyn 4/13>> Cefazolin 4/11>>4/13   Microbiology results: Joint fluid culture 4/13>>  Thank you for allowing pharmacy to be a part of this patient's care.   Patterson Hammersmith PharmD PGY1 Pharmacy Practice Resident 10/10/2017 4:01 PM

## 2017-10-10 NOTE — Plan of Care (Signed)
  Problem: Pain Managment: Goal: General experience of comfort will improve Outcome: Progressing   

## 2017-10-10 NOTE — Consult Note (Signed)
ORTHOPAEDIC CONSULTATION  REQUESTING PHYSICIAN: Aldine Contes, MD  Chief Complaint: Left shoulder pain.  HPI: Jonathan Meadows is a 47 y.o. male who presents with one-week history of left shoulder pain and decreasing range of motion.  Patient had an MRI scan that showed a joint effusion he had a needle aspiration of his shoulder with cultures now positive for gram-positive cocci.  Patient denies a history of diabetes.  Past Medical History:  Diagnosis Date  . Anemia   . Cellulitis   . Homelessness   . Lymphedema of leg   . Paranoid schizophrenia ([place])    No past surgical history on file. Social History   Socioeconomic History  . Marital status: Single    Spouse name: Not on file  . Number of children: Not on file  . Years of education: Not on file  . Highest education level: Not on file  Occupational History  . Not on file  Social Needs  . Financial resource strain: Not on file  . Food insecurity:    Worry: Not on file    Inability: Not on file  . Transportation needs:    Medical: Not on file    Non-medical: Not on file  Tobacco Use  . Smoking status: Never Smoker  . Smokeless tobacco: Never Used  Substance and Sexual Activity  . Alcohol use: No  . Drug use: No  . Sexual activity: Not on file  Lifestyle  . Physical activity:    Days per week: Not on file    Minutes per session: Not on file  . Stress: Not on file  Relationships  . Social connections:    Talks on phone: Not on file    Gets together: Not on file    Attends religious service: Not on file    Active member of club or organization: Not on file    Attends meetings of clubs or organizations: Not on file    Relationship status: Not on file  Other Topics Concern  . Not on file  Social History Narrative  . Not on file   Family History  Problem Relation Age of Onset  . CAD Unknown        Neg HX  . Hypertension Unknown        Neg Hx  . Diabetes Unknown        Neg Hx  . Cancer Unknown      Neg Hx   - negative except otherwise stated in the family history section Allergies  Allergen Reactions  . Pork-Derived Products Nausea And Vomiting   Prior to Admission medications   Medication Sig Start Date End Date Taking? Authorizing Provider  benzonatate (TESSALON) 100 MG capsule Take 1 capsule (100 mg total) by mouth 3 (three) times daily as needed for cough. 06/04/17  Yes Waynetta Pean, PA-C  furosemide (LASIX) 20 MG tablet Take 20 mg by mouth daily.   Yes [provider]  loperamide (IMODIUM) 2 MG capsule Take 1 capsule (2 mg total) by mouth 4 (four) times daily as needed for diarrhea or loose stools. 02/10/17  Yes Montine Circle, PA-C  traMADol (ULTRAM) 50 MG tablet Take 1 tablet (50 mg total) by mouth every 12 (twelve) hours as needed. Patient taking differently: Take 50 mg by mouth every 12 (twelve) hours as needed for moderate pain.  09/22/15  Yes Oni, Lamar Sprinkles, MD  benazepril-hydrochlorthiazide (LOTENSIN HCT) 10-12.5 MG per tablet Take 1 tablet by mouth daily.  09/20/14  [provider]  hydrochlorothiazide (HYDRODIURIL) 25 MG tablet Take 1 tablet (25 mg total) by mouth daily. Patient not taking: Reported on 09/06/2015 11/15/14 09/06/15  Dahlia Bailiff, PA-C  QUEtiapine (SEROQUEL) 50 MG tablet Take 1 tablet (50 mg total) by mouth 2 (two) times daily. Patient not taking: Reported on 09/22/2015 09/12/15 09/22/15  Benjamine Mola, FNP   Mr Cervical Spine Wo Contrast  Result Date: 10/09/2017 CLINICAL DATA:  47 y/o  M; cervical radiculopathy. EXAM: MRI CERVICAL SPINE WITHOUT CONTRAST TECHNIQUE: Multiplanar, multisequence MR imaging of the cervical spine was performed. No intravenous contrast was administered. COMPARISON:  None. FINDINGS: Alignment: Physiologic. Vertebrae: No fracture, evidence of discitis, or bone lesion. Cord: Normal signal and morphology. Posterior Fossa, vertebral arteries, paraspinal tissues: Negative. Disc levels: C2-3: No significant disc displacement,  foraminal stenosis, or canal stenosis. C3-4: Left-sided uncovertebral hypertrophy with small protrusion and mild facet hypertrophy. Moderate left foraminal stenosis. No right foraminal or canal stenosis. C4-5: Mild disc bulge eccentric to the right with mild right foraminal stenosis. No left foraminal or canal stenosis. C5-6: Mild bilateral uncovertebral and facet hypertrophy. Mild foraminal stenosis. No canal stenosis. C6-7: No significant disc displacement, foraminal stenosis, or canal stenosis. C7-T1: No significant disc displacement, foraminal stenosis, or canal stenosis. IMPRESSION: 1. No acute bone or cord signal abnormality. 2. Cervical spondylosis with disc and facet degenerative changes greatest at C3-4. 3. C3-4 left foraminal protrusion and facet hypertrophy resulting in moderate left foraminal stenosis. Multilevel mild foraminal stenosis. No significant canal stenosis. Electronically Signed   By: Kristine Garbe M.D.   On: 10/09/2017 21:28   Mr Shoulder Left Wo Contrast  Result Date: 10/09/2017 CLINICAL DATA:  Acute persistent left shoulder pain. EXAM: MRI OF THE LEFT SHOULDER WITHOUT CONTRAST TECHNIQUE: Multiplanar, multisequence MR imaging of the shoulder was performed. No intravenous contrast was administered. COMPARISON:  10/08/2017 radiographs FINDINGS: Patient could not remain still due to shoulder pain and could not finish the exam and refused further imaging per technologist report. This limits assessment. Axial, coronal, sagittal T2 propeller as well as coronal proton density images were acquired of the left shoulder. Rotator cuff:  No rotator cuff tear. Muscles: Intramuscular edema noted of the deltoid possibly iatrogenic from IM injection, post traumatic contusion or from myositis. Lesser degree of interstitial edema is seen about the subscapularis, infraspinatus and teres minor muscles. Biceps long head:  Intact Acromioclavicular Joint: Normal acromioclavicular joint. No marrow  edema. Type II acromion. Trace subacromial bursal edema. Glenohumeral Joint: Distended glenohumeral joint with fluid. No synovial thickening. Labrum:  No labral tear.  Intact biceps labral complex. Bones: Nonspecific subtle marrow edema of the humeral head and neck. Subcortical degenerative change deep to the supraspinatus insertion. Other: Chondral defects. IMPRESSION: 1. Incomplete shoulder MRI exam. Patient could not finish the study due to shoulder pain. 2. No rotator cuff tear. Trace subacromial bursal fluid compatible with bursitis. 3. Intramuscular edema predominantly involving the anterior deltoid muscle with moderate to large glenohumeral joint effusion. Intramuscular edema could be iatrogenic from intramuscular or intra-articular injection, posttraumatic in etiology or possibly from myositis. No frank bone destruction however there is some reactive edema of the humeral head and neck associated with some subcortical presumed degenerative change near the footplate of the supraspinatus tendon. No definite evidence of septic arthritis. An inflammatory arthropathy or synovitis might account for the joint effusion in addition to posttraumatic etiology. Infected joint effusion less likely. 4. No acute labral or rotator cuff tear. Electronically Signed   By: Ashley Royalty M.D.   On:  10/09/2017 22:39   - pertinent xrays, CT, MRI studies were reviewed and independently interpreted  Positive ROS: All other systems have been reviewed and were otherwise negative with the exception of those mentioned in the HPI and as above.  Physical Exam: General: Alert, no acute distress Psychiatric: Patient is competent for consent with normal mood and affect Lymphatic: No axillary or cervical lymphadenopathy Cardiovascular: No pedal edema Respiratory: No cyanosis, no use of accessory musculature GI: No organomegaly, abdomen is soft and non-tender  Skin: The skin is intact no  lesions.  Images:  @ENCIMAGES @   Neurologic: Patient does  have protective sensation bilateral lower extremities.   MUSCULOSKELETAL:  Examination patient has pain with attempted range of motion of his left shoulder.  The MRI scan does show a effusion of the glenohumeral joint anteriorly and posteriorly there is some mild edema in the deltoid muscles no edema within the bone no signs of osteomyelitis.  Initial reading felt that infection was less likely.  Assessment: Assessment septic left shoulder joint.  With 1 week history of shoulder pain.  Plan: Plan: We will plan for arthroscopic debridement of the left shoulder tomorrow morning.  Risks and benefits of surgery were discussed including persistent infection.  Patient states he understands wishes to proceed at this time.  Thank you for the consult and the opportunity to see Jonathan Meadows, [place] [phone] 9:17 PM

## 2017-10-10 NOTE — H&P (View-Only) (Signed)
ORTHOPAEDIC CONSULTATION  REQUESTING PHYSICIAN: Aldine Contes, MD  Chief Complaint: Meadows shoulder pain.  HPI: Jonathan Meadows is a 47 y.o. male who presents with one-week history of Meadows shoulder pain and decreasing range of motion.  Patient had an MRI scan that showed a joint effusion he had a needle aspiration of his shoulder with cultures now positive for gram-positive cocci.  Patient denies a history of diabetes.  Past Medical History:  Diagnosis Date  . Anemia   . Cellulitis   . Homelessness   . Lymphedema of leg   . Paranoid schizophrenia ([place])    No past surgical history on file. Social History   Socioeconomic History  . Marital status: Single    Spouse name: Not on file  . Number of children: Not on file  . Years of education: Not on file  . Highest education level: Not on file  Occupational History  . Not on file  Social Needs  . Financial resource strain: Not on file  . Food insecurity:    Worry: Not on file    Inability: Not on file  . Transportation needs:    Medical: Not on file    Non-medical: Not on file  Tobacco Use  . Smoking status: Never Smoker  . Smokeless tobacco: Never Used  Substance and Sexual Activity  . Alcohol use: No  . Drug use: No  . Sexual activity: Not on file  Lifestyle  . Physical activity:    Days per week: Not on file    Minutes per session: Not on file  . Stress: Not on file  Relationships  . Social connections:    Talks on phone: Not on file    Gets together: Not on file    Attends religious service: Not on file    Active member of club or organization: Not on file    Attends meetings of clubs or organizations: Not on file    Relationship status: Not on file  Other Topics Concern  . Not on file  Social History Narrative  . Not on file   Family History  Problem Relation Age of Onset  . CAD Unknown        Neg HX  . Hypertension Unknown        Neg Hx  . Diabetes Unknown        Neg Hx  . Cancer Unknown      Neg Hx   - negative except otherwise stated in the family history section Allergies  Allergen Reactions  . Pork-Derived Products Nausea And Vomiting   Prior to Admission medications   Medication Sig Start Date End Date Taking? Authorizing Provider  benzonatate (TESSALON) 100 MG capsule Take 1 capsule (100 mg total) by mouth 3 (three) times daily as needed for cough. 06/04/17  Yes Waynetta Pean, PA-C  furosemide (LASIX) 20 MG tablet Take 20 mg by mouth daily.   Yes [provider]  loperamide (IMODIUM) 2 MG capsule Take 1 capsule (2 mg total) by mouth 4 (four) times daily as needed for diarrhea or loose stools. 02/10/17  Yes Montine Circle, PA-C  traMADol (ULTRAM) 50 MG tablet Take 1 tablet (50 mg total) by mouth every 12 (twelve) hours as needed. Patient taking differently: Take 50 mg by mouth every 12 (twelve) hours as needed for moderate pain.  09/22/15  Yes Oni, Lamar Sprinkles, MD  benazepril-hydrochlorthiazide (LOTENSIN HCT) 10-12.5 MG per tablet Take 1 tablet by mouth daily.  09/20/14  [provider]  hydrochlorothiazide (HYDRODIURIL) 25 MG tablet Take 1 tablet (25 mg total) by mouth daily. Patient not taking: Reported on 09/06/2015 11/15/14 09/06/15  Dahlia Bailiff, PA-C  QUEtiapine (SEROQUEL) 50 MG tablet Take 1 tablet (50 mg total) by mouth 2 (two) times daily. Patient not taking: Reported on 09/22/2015 09/12/15 09/22/15  Benjamine Mola, FNP   Jonathan Meadows Wo Contrast  Result Date: 10/09/2017 CLINICAL DATA:  47 y/o  M; cervical radiculopathy. EXAM: MRI CERVICAL Meadows WITHOUT CONTRAST TECHNIQUE: Multiplanar, multisequence Jonathan imaging of the cervical Meadows was performed. No intravenous contrast was administered. COMPARISON:  None. FINDINGS: Alignment: Physiologic. Vertebrae: No fracture, evidence of discitis, or bone lesion. Cord: Normal signal and morphology. Posterior Fossa, vertebral arteries, paraspinal tissues: Negative. Disc levels: C2-3: No significant disc displacement,  foraminal stenosis, or canal stenosis. C3-4: Meadows-sided uncovertebral hypertrophy with small protrusion and mild facet hypertrophy. Moderate Meadows foraminal stenosis. No right foraminal or canal stenosis. C4-5: Mild disc bulge eccentric to the right with mild right foraminal stenosis. No Meadows foraminal or canal stenosis. C5-6: Mild bilateral uncovertebral and facet hypertrophy. Mild foraminal stenosis. No canal stenosis. C6-7: No significant disc displacement, foraminal stenosis, or canal stenosis. C7-T1: No significant disc displacement, foraminal stenosis, or canal stenosis. IMPRESSION: 1. No acute bone or cord signal abnormality. 2. Cervical spondylosis with disc and facet degenerative changes greatest at C3-4. 3. C3-4 Meadows foraminal protrusion and facet hypertrophy resulting in moderate Meadows foraminal stenosis. Multilevel mild foraminal stenosis. No significant canal stenosis. Electronically Signed   By: Kristine Garbe M.D.   On: 10/09/2017 21:28   Jonathan Meadows Wo Contrast  Result Date: 10/09/2017 CLINICAL DATA:  Acute persistent Meadows shoulder pain. EXAM: MRI OF THE Meadows SHOULDER WITHOUT CONTRAST TECHNIQUE: Multiplanar, multisequence Jonathan imaging of the shoulder was performed. No intravenous contrast was administered. COMPARISON:  10/08/2017 radiographs FINDINGS: Patient could not remain still due to shoulder pain and could not finish the exam and refused further imaging per technologist report. This limits assessment. Axial, coronal, sagittal T2 propeller as well as coronal proton density images were acquired of the Meadows shoulder. Rotator cuff:  No rotator cuff tear. Muscles: Intramuscular edema noted of the deltoid possibly iatrogenic from IM injection, post traumatic contusion or from myositis. Lesser degree of interstitial edema is seen about the subscapularis, infraspinatus and teres minor muscles. Biceps long head:  Intact Acromioclavicular Joint: Normal acromioclavicular joint. No marrow  edema. Type II acromion. Trace subacromial bursal edema. Glenohumeral Joint: Distended glenohumeral joint with fluid. No synovial thickening. Labrum:  No labral tear.  Intact biceps labral complex. Bones: Nonspecific subtle marrow edema of the humeral head and neck. Subcortical degenerative change deep to the supraspinatus insertion. Other: Chondral defects. IMPRESSION: 1. Incomplete shoulder MRI exam. Patient could not finish the study due to shoulder pain. 2. No rotator cuff tear. Trace subacromial bursal fluid compatible with bursitis. 3. Intramuscular edema predominantly involving the anterior deltoid muscle with moderate to large glenohumeral joint effusion. Intramuscular edema could be iatrogenic from intramuscular or intra-articular injection, posttraumatic in etiology or possibly from myositis. No frank bone destruction however there is some reactive edema of the humeral head and neck associated with some subcortical presumed degenerative change near the footplate of the supraspinatus tendon. No definite evidence of septic arthritis. An inflammatory arthropathy or synovitis might account for the joint effusion in addition to posttraumatic etiology. Infected joint effusion less likely. 4. No acute labral or rotator cuff tear. Electronically Signed   By: Ashley Royalty M.D.   On:  10/09/2017 22:39   - pertinent xrays, CT, MRI studies were reviewed and independently interpreted  Positive ROS: All other systems have been reviewed and were otherwise negative with the exception of those mentioned in the HPI and as above.  Physical Exam: General: Alert, no acute distress Psychiatric: Patient is competent for consent with normal mood and affect Lymphatic: No axillary or cervical lymphadenopathy Cardiovascular: No pedal edema Respiratory: No cyanosis, no use of accessory musculature GI: No organomegaly, abdomen is soft and non-tender  Skin: The skin is intact no  lesions.  Images:  @ENCIMAGES @   Neurologic: Patient does  have protective sensation bilateral lower extremities.   MUSCULOSKELETAL:  Examination patient has pain with attempted range of motion of his Meadows shoulder.  The MRI scan does show a effusion of the glenohumeral joint anteriorly and posteriorly there is some mild edema in the deltoid muscles no edema within the bone no signs of osteomyelitis.  Initial reading felt that infection was less likely.  Assessment: Assessment septic Meadows shoulder joint.  With 1 week history of shoulder pain.  Plan: Plan: We will plan for arthroscopic debridement of the Meadows shoulder tomorrow morning.  Risks and benefits of surgery were discussed including persistent infection.  Patient states he understands wishes to proceed at this time.  Thank you for the consult and the opportunity to see Jonathan. Letitia Libra, [employer] [phone] 9:17 PM

## 2017-10-10 NOTE — Progress Notes (Addendum)
Subjective:  Patient reports continued left shoulder pain. Denies fevers, nausea, vomiting. Denies chest pain.  Objective:  Vital signs in last 24 hours: Vitals:   10/09/17 1040 10/09/17 1524 10/09/17 2214 10/10/17 0629  BP: 103/73 110/64 111/79 118/72  Pulse:  (!) 102 98 92  Resp:  18 (!) 30 18  Temp:  99.6 F (37.6 C) 99.2 F (37.3 C) 98.7 F (37.1 C)  TempSrc:  Oral Oral Oral  SpO2:  97% 95% 97%  Weight:    218 lb 8 oz (99.1 kg)  Height:         Physical Exam  Constitutional: He is well-developed, well-nourished, and in no distress.  Cardiovascular: Normal rate, regular rhythm and normal heart sounds. Exam reveals no gallop and no friction rub.  No murmur heard. Pulmonary/Chest: Effort normal and breath sounds normal. No respiratory distress. He has no wheezes. He has no rales.  Musculoskeletal:  2+ edema on right leg up to thigh Left shoulder with mild warmth and slight erythema. Unable to move arm due to pain    Assessment/Plan:  Active Problems:   Chest pain   Cellulitis  Chest pain: Likely musculoskeletal in nature as it is reproducible on exam.  cardiology was consulted due to spurious troponin results with elevations, recommended to no longer trend.  ECHO shows EF 55-60%, G1DD -Monitor on telemetry -Voltaren gel -Tylenol PRN -Continue home tramadol 50 mg twice daily as needed   Right lower extremity nonpurulent cellulitis: He has a history of chronic lymphedema of the right lower extremity.  Area improving, will continue with cefazolin -Cefazolin -CBC's   Left shoulder and arm pain: X-ray of left shoulder showing mild degenerative changes in the [facility] joint. Physical exam limited by pts pain.  MRI of shoulder showed trace subacromial bursal fluid compatible with bursitis.  Also, Intramuscular edema predominantly involving the anterior deltoid muscle with moderate to large glenohumeral joint effusion.  Will need joint aspiriation - joint aspiration     -procedure note below - oxycodone IR  Chronic lymphedema right lower extremity: pt followed by [facility] surgery at [facility].   -will need outpatient follow up - restarting home lasix   Hypokalemia: In the setting of home Lasix use. Resolved. K currently 3.7 -Continue to monitor BMP and replete potassium as needed - Restarting home Lasix   Paranoid schizophrenia: Not on any home medications. -Continue to monitor -PCP follow-up after discharge  PROCEDURE NOTE  PROCEDURE: left shoulder joint aspiration.  PREOPERATIVE DIAGNOSIS: Left glenohumeral joint effusion.  POSTOPERATIVE DIAGNOSIS:  Left glenohumeral joint effusion.  PROCEDURE: The patient was apprised of the risks and the benefits of the procedure and informed consent was obtained, as witnessed by Dr. Heber [place]. Time-out procedure was performed, with confirmation of the patient's name, date of birth, and correct identification of the left shoulder to be injected. The left glenohumeral joint was imaged under ultrasound prior to the procedure to better assess entry point of the needle.  The patient's shoulder was then marked at the appropriate site for injection placement. The shoulder was sterilely prepped with Betadine. 5 mL of 1% lidocaine was drawn and injected into the glenohumeral joint space . Aspiration was done with an 18 gauge needle at the posterior  aspect of his  left glenohumeral joint. There were no complications. The patient tolerated the procedure well. There was very minimal bleeding. The patient was instructed to rest the shoulder.  Cell count with diff, culture with gram stain and crystals were ordered on  the sample.  The procedure was supervised by attending physician, Dr. Evette Doffing.  Dispo: Anticipated discharge pending clinical improvement.   Kalman Shan Philippi, DO 10/10/2017, 9:12 AM

## 2017-10-10 NOTE — Plan of Care (Signed)
WBC 11.7, Continue IV Cefazolin.

## 2017-10-11 ENCOUNTER — Encounter (HOSPITAL_COMMUNITY)
Admission: EM | Disposition: A | Discharge status: Home / Self Care | Payer: Self-pay | Source: Home / Self Care | Attending: Internal Medicine

## 2017-10-11 ENCOUNTER — Encounter (HOSPITAL_COMMUNITY): Payer: Self-pay | Admitting: *Deleted

## 2017-10-11 ENCOUNTER — Inpatient Hospital Stay (HOSPITAL_COMMUNITY): Payer: Medicaid Other | Admitting: Anesthesiology

## 2017-10-11 DIAGNOSIS — R7303 Prediabetes: Secondary | ICD-10-CM

## 2017-10-11 DIAGNOSIS — M7542 Impingement syndrome of left shoulder: Secondary | ICD-10-CM

## 2017-10-11 DIAGNOSIS — M7502 Adhesive capsulitis of left shoulder: Secondary | ICD-10-CM

## 2017-10-11 DIAGNOSIS — M25412 Effusion, left shoulder: Secondary | ICD-10-CM

## 2017-10-11 HISTORY — PX: SHOULDER ARTHROSCOPY: SHX128

## 2017-10-11 LAB — HEMOGLOBIN A1C
HEMOGLOBIN A1C: 5.2 % (ref 4.8–5.6)
MEAN PLASMA GLUCOSE: 102.54 mg/dL

## 2017-10-11 LAB — VANCOMYCIN, TROUGH: VANCOMYCIN TR: 38 ug/mL — AB (ref 15–20)

## 2017-10-11 SURGERY — ARTHROSCOPY, SHOULDER
Anesthesia: General | Site: Shoulder | Laterality: Left

## 2017-10-11 MED ORDER — BUPIVACAINE HCL (PF) 0.25 % IJ SOLN
INTRAMUSCULAR | Status: AC
  Filled 2017-10-11: qty 30

## 2017-10-11 MED ORDER — METHOCARBAMOL 1000 MG/10ML IJ SOLN
500.0000 mg | Freq: Four times a day (QID) | INTRAMUSCULAR | Status: DC | PRN
  Filled 2017-10-11: qty 5

## 2017-10-11 MED ORDER — MEPERIDINE HCL 50 MG/ML IJ SOLN: 6.2500 mg | INTRAMUSCULAR | Status: DC | PRN

## 2017-10-11 MED ORDER — GLYCOPYRROLATE 0.2 MG/ML IV SOSY
PREFILLED_SYRINGE | INTRAVENOUS | Status: DC | PRN
  Administered 2017-10-11: 0.6 mg via INTRAVENOUS

## 2017-10-11 MED ORDER — ONDANSETRON HCL 4 MG/2ML IJ SOLN
INTRAMUSCULAR | Status: DC | PRN
  Administered 2017-10-11: 4 mg via INTRAVENOUS

## 2017-10-11 MED ORDER — FENTANYL CITRATE (PF) 100 MCG/2ML IJ SOLN: 25.0000 ug | INTRAMUSCULAR | Status: DC | PRN

## 2017-10-11 MED ORDER — POLYETHYLENE GLYCOL 3350 17 G PO PACK: 17.0000 g | PACK | Freq: Every day | ORAL | Status: DC | PRN

## 2017-10-11 MED ORDER — BISACODYL 10 MG RE SUPP: 10.0000 mg | Freq: Every day | RECTAL | Status: DC | PRN

## 2017-10-11 MED ORDER — METOCLOPRAMIDE HCL 5 MG/ML IJ SOLN
5.0000 mg | Freq: Three times a day (TID) | INTRAMUSCULAR | Status: DC | PRN
  Filled 2017-10-11: qty 2

## 2017-10-11 MED ORDER — LACTATED RINGERS IV SOLN
INTRAVENOUS | Status: DC | PRN
  Administered 2017-10-11: 09:00:00 via INTRAVENOUS

## 2017-10-11 MED ORDER — PROPOFOL 10 MG/ML IV BOLUS
INTRAVENOUS | Status: DC | PRN
  Administered 2017-10-11: 140 mg via INTRAVENOUS

## 2017-10-11 MED ORDER — METHOCARBAMOL 500 MG PO TABS
500.0000 mg | ORAL_TABLET | Freq: Four times a day (QID) | ORAL | Status: DC | PRN
  Administered 2017-10-11 – 2017-10-13 (×7): 500 mg via ORAL
  Filled 2017-10-11 (×8): qty 1

## 2017-10-11 MED ORDER — DOCUSATE SODIUM 100 MG PO CAPS
100.0000 mg | ORAL_CAPSULE | Freq: Two times a day (BID) | ORAL | Status: DC
  Administered 2017-10-11 – 2017-10-13 (×4): 100 mg via ORAL
  Filled 2017-10-11 (×6): qty 1

## 2017-10-11 MED ORDER — CLONIDINE HCL (ANALGESIA) 100 MCG/ML EP SOLN
EPIDURAL | Status: DC | PRN
  Administered 2017-10-11: 50 ug

## 2017-10-11 MED ORDER — SODIUM CHLORIDE 0.9 % IR SOLN
Status: DC | PRN
  Administered 2017-10-11: 3000 mL

## 2017-10-11 MED ORDER — HYDROCODONE-ACETAMINOPHEN 7.5-325 MG PO TABS
1.0000 | ORAL_TABLET | ORAL | Status: DC | PRN
  Administered 2017-10-12 – 2017-10-13 (×6): 2 via ORAL
  Filled 2017-10-11: qty 2
  Filled 2017-10-11 (×2): qty 1
  Filled 2017-10-11 (×5): qty 2

## 2017-10-11 MED ORDER — FENTANYL CITRATE (PF) 100 MCG/2ML IJ SOLN
INTRAMUSCULAR | Status: DC | PRN
  Administered 2017-10-11: 100 ug via INTRAVENOUS

## 2017-10-11 MED ORDER — ROPIVACAINE HCL 7.5 MG/ML IJ SOLN
INTRAMUSCULAR | Status: DC | PRN
  Administered 2017-10-11: 25 mL via PERINEURAL

## 2017-10-11 MED ORDER — MORPHINE SULFATE (PF) 2 MG/ML IV SOLN
0.5000 mg | INTRAVENOUS | Status: DC | PRN
  Administered 2017-10-11 – 2017-10-12 (×3): 1 mg via INTRAVENOUS
  Filled 2017-10-11 (×3): qty 1

## 2017-10-11 MED ORDER — ONDANSETRON HCL 4 MG/2ML IJ SOLN
4.0000 mg | Freq: Four times a day (QID) | INTRAMUSCULAR | Status: DC | PRN
  Filled 2017-10-11: qty 2

## 2017-10-11 MED ORDER — BUPIVACAINE HCL (PF) 0.25 % IJ SOLN
INTRAMUSCULAR | Status: DC | PRN
  Administered 2017-10-11: 10 mL

## 2017-10-11 MED ORDER — LIDOCAINE 2% (20 MG/ML) 5 ML SYRINGE
INTRAMUSCULAR | Status: DC | PRN
  Administered 2017-10-11: 100 mg via INTRAVENOUS

## 2017-10-11 MED ORDER — FENTANYL CITRATE (PF) 250 MCG/5ML IJ SOLN
INTRAMUSCULAR | Status: AC
  Filled 2017-10-11: qty 5

## 2017-10-11 MED ORDER — FENTANYL CITRATE (PF) 100 MCG/2ML IJ SOLN
INTRAMUSCULAR | Status: AC
  Administered 2017-10-11: 100 ug via INTRAVENOUS
  Filled 2017-10-11: qty 2

## 2017-10-11 MED ORDER — SODIUM CHLORIDE 0.9 % IV SOLN
INTRAVENOUS | Status: DC
  Administered 2017-10-11: 14:00:00 via INTRAVENOUS

## 2017-10-11 MED ORDER — MIDAZOLAM HCL 2 MG/2ML IJ SOLN
INTRAMUSCULAR | Status: AC
  Filled 2017-10-11: qty 2

## 2017-10-11 MED ORDER — FENTANYL CITRATE (PF) 100 MCG/2ML IJ SOLN
100.0000 ug | Freq: Once | INTRAMUSCULAR | Status: AC
  Administered 2017-10-11: 100 ug via INTRAVENOUS

## 2017-10-11 MED ORDER — MAGNESIUM CITRATE PO SOLN: 1.0000 | Freq: Once | ORAL | Status: DC | PRN

## 2017-10-11 MED ORDER — HYDROCODONE-ACETAMINOPHEN 5-325 MG PO TABS
1.0000 | ORAL_TABLET | ORAL | Status: DC | PRN
  Administered 2017-10-11 – 2017-10-12 (×5): 2 via ORAL
  Filled 2017-10-11 (×5): qty 2

## 2017-10-11 MED ORDER — PHENYLEPHRINE HCL 10 MG/ML IJ SOLN
INTRAMUSCULAR | Status: DC | PRN
  Administered 2017-10-11: 25 ug/min via INTRAVENOUS

## 2017-10-11 MED ORDER — PROPOFOL 10 MG/ML IV BOLUS
INTRAVENOUS | Status: AC
  Filled 2017-10-11: qty 40

## 2017-10-11 MED ORDER — PROMETHAZINE HCL 25 MG/ML IJ SOLN: 6.2500 mg | INTRAMUSCULAR | Status: DC | PRN

## 2017-10-11 MED ORDER — ROCURONIUM BROMIDE 10 MG/ML (PF) SYRINGE
PREFILLED_SYRINGE | INTRAVENOUS | Status: DC | PRN
  Administered 2017-10-11: 50 mg via INTRAVENOUS

## 2017-10-11 MED ORDER — ACETAMINOPHEN 325 MG PO TABS: 325.0000 mg | ORAL_TABLET | Freq: Four times a day (QID) | ORAL | Status: DC | PRN

## 2017-10-11 MED ORDER — DEXAMETHASONE SODIUM PHOSPHATE 10 MG/ML IJ SOLN
INTRAMUSCULAR | Status: DC | PRN
  Administered 2017-10-11: 10 mg via INTRAVENOUS

## 2017-10-11 MED ORDER — METOCLOPRAMIDE HCL 5 MG PO TABS
5.0000 mg | ORAL_TABLET | Freq: Three times a day (TID) | ORAL | Status: DC | PRN
  Filled 2017-10-11: qty 2

## 2017-10-11 MED ORDER — ONDANSETRON HCL 4 MG PO TABS
4.0000 mg | ORAL_TABLET | Freq: Four times a day (QID) | ORAL | Status: DC | PRN
Start: 2017-10-11 — End: 2017-10-13
  Filled 2017-10-11: qty 1

## 2017-10-11 MED ORDER — NEOSTIGMINE METHYLSULFATE 5 MG/5ML IV SOSY
PREFILLED_SYRINGE | INTRAVENOUS | Status: DC | PRN
  Administered 2017-10-11: 4 mg via INTRAVENOUS

## 2017-10-11 SURGICAL SUPPLY — 39 items
BLADE EXCALIBUR 4.0MM X 13CM (MISCELLANEOUS) ×1
BLADE EXCALIBUR 4.0X13 (MISCELLANEOUS) ×1 IMPLANT
BLADE GREAT WHITE 4.2 (BLADE) ×2 IMPLANT
BLADE GREAT WHITE 4.2MM (BLADE) ×1
BUR OVAL 6.0 (BURR) ×3 IMPLANT
CANNULA ACUFLEX KIT 5X76 (CANNULA) ×2 IMPLANT
CANNULA SHOULDER 7CM (CANNULA) ×3 IMPLANT
COVER SURGICAL LIGHT HANDLE (MISCELLANEOUS) ×6 IMPLANT
DRAPE STERI 35X30 U-POUCH (DRAPES) ×3 IMPLANT
DRAPE U-SHAPE 47X51 STRL (DRAPES) ×3 IMPLANT
DRESSING ADAPTIC 1/2  N-ADH (PACKING) ×2 IMPLANT
DRSG EMULSION OIL 3X3 NADH (GAUZE/BANDAGES/DRESSINGS) ×3 IMPLANT
DRSG PAD ABDOMINAL 8X10 ST (GAUZE/BANDAGES/DRESSINGS) ×6 IMPLANT
DURAPREP 26ML APPLICATOR (WOUND CARE) ×3 IMPLANT
GAUZE SPONGE 4X4 12PLY STRL (GAUZE/BANDAGES/DRESSINGS) ×3 IMPLANT
GAUZE SPONGE 4X4 16PLY XRAY LF (GAUZE/BANDAGES/DRESSINGS) ×2 IMPLANT
GLOVE BIOGEL PI IND STRL 9 (GLOVE) ×1 IMPLANT
GLOVE BIOGEL PI INDICATOR 9 (GLOVE) ×2
GLOVE SURG ORTHO 9.0 STRL STRW (GLOVE) ×3 IMPLANT
GOWN STRL REUS W/ TWL XL LVL3 (GOWN DISPOSABLE) ×2 IMPLANT
GOWN STRL REUS W/TWL XL LVL3 (GOWN DISPOSABLE) ×6
KIT BASIN OR (CUSTOM PROCEDURE TRAY) ×3 IMPLANT
KIT TURNOVER KIT B (KITS) ×3 IMPLANT
MANIFOLD NEPTUNE II (INSTRUMENTS) ×3 IMPLANT
NDL SPNL 18GX3.5 QUINCKE PK (NEEDLE) ×1 IMPLANT
NEEDLE SPNL 18GX3.5 QUINCKE PK (NEEDLE) ×3 IMPLANT
NS IRRIG 1000ML POUR BTL (IV SOLUTION) ×3 IMPLANT
PACK SHOULDER (CUSTOM PROCEDURE TRAY) ×3 IMPLANT
PAD ABD 8X10 STRL (GAUZE/BANDAGES/DRESSINGS) ×4 IMPLANT
PAD ARMBOARD 7.5X6 YLW CONV (MISCELLANEOUS) ×6 IMPLANT
PROBE BIPOLAR ARTHRO 85MM 30D (MISCELLANEOUS) ×2 IMPLANT
SET ARTHROSCOPY TUBING (MISCELLANEOUS) ×3
SET ARTHROSCOPY TUBING LN (MISCELLANEOUS) ×1 IMPLANT
SLING ARM IMMOBILIZER XL (CAST SUPPLIES) ×3 IMPLANT
SUT ETHILON 2 0 FS 18 (SUTURE) ×3 IMPLANT
TOWEL OR 17X24 6PK STRL BLUE (TOWEL DISPOSABLE) ×6 IMPLANT
TUBING ARTHROSCOPY IRRIG 16FT (MISCELLANEOUS) ×2 IMPLANT
WAND STAR VAC 90 (SURGICAL WAND) IMPLANT
WATER STERILE IRR 1000ML POUR (IV SOLUTION) ×3 IMPLANT

## 2017-10-11 NOTE — Transfer of Care (Signed)
Immediate Anesthesia Transfer of Care Note  Patient: Jonathan Meadows  Procedure(s) Performed: ARTHROSCOPY SHOULDER, LEFT (Left )  Patient Location: PACU  Anesthesia Type:General and Regional  Level of Consciousness: patient cooperative and responds to stimulation  Airway & Oxygen Therapy: Patient Spontanous Breathing and Patient connected to nasal cannula oxygen  Post-op Assessment: Report given to RN and Post -op Vital signs reviewed and stable  Post vital signs: Reviewed and stable  Last Vitals:  Vitals Value Taken Time  BP 116/83 10/11/2017  9:49 AM  Temp    Pulse 81 10/11/2017  9:48 AM  Resp 23 10/11/2017  9:50 AM  SpO2 100 % 10/11/2017  9:48 AM  Vitals shown include unvalidated device data.  Last Pain:  Vitals:   10/11/17 0518  TempSrc:   PainSc: Asleep      Patients Stated Pain Goal: 0 (72/09/47 0962)  Complications: no anesthesia complications

## 2017-10-11 NOTE — Anesthesia Procedure Notes (Signed)
Anesthesia Regional Block: Interscalene brachial plexus block   Pre-Anesthetic Checklist: ,, timeout performed, Correct Patient, Correct Site, Correct Laterality, Correct Procedure, Correct Position, site marked, Risks and benefits discussed,  Surgical consent,  Pre-op evaluation,  At surgeon's request and post-op pain management  Laterality: Left  Prep: chloraprep       Needles:  Injection technique: Single-shot  Needle Type: Stimulator Needle - 40     Needle Length: 4cm  Needle Gauge: 22     Additional Needles:   Procedures:,,,, ultrasound used (permanent image in chart),,,,  Narrative:  Start time: 10/11/2017 8:03 AM End time: 10/11/2017 8:06 AM Injection made incrementally with aspirations every 5 mL. Anesthesiologist: Nolon Nations, MD  Additional Notes: BP cuff, EKG monitors applied. Sedation begun. Nerve location verified with U/S. Anesthetic injected incrementally, slowly , and after neg aspirations under direct u/s guidance. Good perineural spread. Tolerated well.

## 2017-10-11 NOTE — Progress Notes (Signed)
Reported Vanco trough of 38 to Greg in Pharmacy.

## 2017-10-11 NOTE — Op Note (Signed)
10/11/2017  9:33 AM  PATIENT:  Jonathan Meadows    PRE-OPERATIVE DIAGNOSIS:  ABSCESS, LEFT SHOULDER  POST-OPERATIVE DIAGNOSIS: Impingement syndrome with adhesive capsulitis.  PROCEDURE:  ARTHROSCOPY SHOULDER, LEFT with subacromial decompression with extensive debridement of the glenohumeral joint with debridement of SLAP lesion debridement of biceps tendon and debridement of adhesions to the subscapularis.  SURGEON:  Newt Minion, MD  PHYSICIAN ASSISTANT:None ANESTHESIA:   General  PREOPERATIVE INDICATIONS:  Jonathan Meadows is a  47 y.o. male with a diagnosis of ABSCESS, LEFT SHOULDER who failed conservative measures and elected for surgical management.    Patient had a aspiration performed that showed a white cell count greater than 100,000 and Gram stain was positive for gram-positive cocci.  Patient has had symptoms for over a week.  He presents at this time for arthroscopic debridement and decompression.  Risks and benefits were discussed including persistent infection.  The risks benefits and alternatives were discussed with the patient preoperatively including but not limited to the risks of infection, bleeding, nerve injury, cardiopulmonary complications, the need for revision surgery, among others, and the patient was willing to proceed.  OPERATIVE IMPLANTS: None  @ENCIMAGES @  OPERATIVE FINDINGS: There was clear blood-tinged fluid within the glenohumeral joint this was sent for cultures.  There is no purulence this did not have the appearance of infected fluid.  OPERATIVE PROCEDURE: Patient was brought to the operating room and underwent a general anesthetic.  After adequate levels of anesthesia were obtained patient was placed in the beachchair position the left upper extremity was prepped using DuraPrep draped into a sterile field a timeout was called.  A 18-gauge needle was inserted from the outside in technique to localize the anterior portal and the posterior portal was used for  the arthroscope.  The fluid that drained from the cannula was approximately 5 cc this was sent for aerobic and anaerobic cultures.  This was clear serosanguineous fluid no purulence.  Visualization showed synovitis within the joint lining adhesive capsulitis to the subscapularis partial tearing of the labrum with a SLAP lesion with intact biceps with inflammation of the insertion of the biceps.  Patient underwent manipulation under anesthesia of the adhesions to the subscapularis were debrided with the shaver.  Extensive debridement was performed of the joint lining there was good attachment of the rotator cuff along the humeral head.  The biceps tendon was intact.  The articular cartilage was intact.  Patient had significant improvement motion after release of the subscapularis adhesions.  The instrument was removed and the scope was then inserted from the posterior portal into the subacromial space and a new lateral portal was established.  Visualization patient had a hooked type III acromium with extensive subacromial bursitis.  There is no purulence in the subacromial space.  Patient went extensive subacromial bursectomy underwent subacromial decompression with debridement of the hook type III acromion.  The incision was removed the portals were closed using 2-0 nylon in a sterile dressing was applied patient was extubated taken the PACU in stable condition.   DISCHARGE PLANNING:  Antibiotic duration: Would continue IV antibiotics until cultures are finalized and then discharged on 4 weeks of oral antibiotics.  Weightbearing: No restrictions  Pain medication: As needed  Dressing care/ Wound VAC: Change dressing in 2 days.  Ambulatory devices: Not applicable  Discharge to: Home.  Follow-up: In the office 1 week post operative.

## 2017-10-11 NOTE — Progress Notes (Signed)
OT Cancellation Note  Patient Details Name: Macalister Arnaud MRN: [id number] DOB: January 17, 1971   Cancelled Treatment:    Reason Eval/Treat Not Completed: Patient at procedure or test/ unavailable(currently in OR)  Binnie Kand M.S., OTR/L Pager: [phone]  10/11/2017, 9:33 AM

## 2017-10-11 NOTE — Progress Notes (Signed)
   Subjective:  Patient reports continued left shoulder pain although stable. Denies fevers, nausea, vomiting. Denies chest pain.  Objective:  Vital signs in last 24 hours: Vitals:   10/11/17 0949 10/11/17 1000 10/11/17 1005 10/11/17 1015  BP: 116/83  113/79 120/85  Pulse:      Resp: 14 12 (!) 21 (!) 22  Temp:    (!) 97.2 F (36.2 C)  TempSrc:      SpO2: 100% 100% 98% 96%  Weight:      Height:         Physical Exam  Constitutional: He is well-developed, well-nourished, and in no distress.  Cardiovascular: Normal rate, regular rhythm and normal heart sounds. Exam reveals no gallop and no friction rub.  No murmur heard. Pulmonary/Chest: Effort normal and breath sounds normal. No respiratory distress. He has no wheezes. He has no rales.  Musculoskeletal:  2+ edema on right leg up to thigh Exam of Left shoulder limited by pain  Skin: Skin is warm and dry. No erythema.    Assessment/Plan:  Principal Problem:   Effusion of left shoulder joint Active Problems:   Chest pain   Cellulitis   Adhesive capsulitis of left shoulder   Impingement syndrome of left shoulder  Left shoulder and arm pain: Had glenohumeral joint aspiration 4/13 showing over 160,000 wbc, predominantly gram positive cocci in pairs/clusters.  Will take off zosyn.  Patient had left shoulder arthroscopy this morning that also showed adhesive capsulitis. Cultures sent.  Will await results and get IDs input for treatment regimen and duration - norco, toradol and morphine - fluid culture pending  Chest pain: Resolved.  Likely musculoskeletal in nature as it is reproducible on exam.  cardiology was consulted due to spurious troponin results with elevations, recommended to no longer trend.  ECHO shows EF 55-60%, G1DD -Monitor on telemetry -Voltaren gel -Tylenol PRN   Right lower extremity nonpurulent cellulitis: He has a history of chronic lymphedema of the right lower extremity. Cellulitis resolved.  Currently on  vanc for infected shoulder joint. -CBC   Chronic lymphedema right lower extremity: pt followed by vascular surgery at [facility].   -will need outpatient follow up - home lasix   Hypokalemia: In the setting of home Lasix use. Resolved. K currently 3.7 -Continue to monitor BMP and replete potassium as needed - Restarting home Lasix   Paranoid schizophrenia: Not on any home medications. -Continue to monitor -PCP follow-up after discharge   Dispo: Anticipated discharge pending clinical improvement.   Kalman Shan Avilla, DO 10/11/2017, 11:10 AM

## 2017-10-11 NOTE — Plan of Care (Signed)
Patient fired Therapist, sports at introduction to shift.  Charge RN and SWAT RN Loma [place] tried to pass medications and pt not cooperative. Pt angry, verbally aggressive, loud, agitated, escalating a situation in which staff is tryint to help and offer options.  Refusing medications, treatment.

## 2017-10-11 NOTE — Anesthesia Preprocedure Evaluation (Signed)
Anesthesia Evaluation  Patient identified by MRN, date of birth, ID band Patient awake    Reviewed: Allergy & Precautions, NPO status , Patient's Chart, lab work & pertinent test results  Airway Mallampati: II  TM Distance: >3 FB Neck ROM: Full    Dental no notable dental hx.    Pulmonary neg pulmonary ROS,    Pulmonary exam normal breath sounds clear to auscultation       Cardiovascular Normal cardiovascular exam Rhythm:Regular Rate:Normal  Echo 09/2017 - Left ventricle: The cavity size was mildly dilated. Wall   thickness was normal. Systolic function was normal. The estimated ejection fraction was in the range of 55% to 60%. Wall motion was normal; there were no regional wall motion abnormalities. Doppler parameters are consistent with abnormal left ventricular relaxation (grade 1 diastolic dysfunction). - Pulmonary arteries: Systolic pressure was mildly increased. PA peak pressure: 35 mm Hg (S).  Impressions: - Normal LV systolic function; mild diastolic dysfunction; mild LVE; mild TR with mild pulmonary hypertension.   Neuro/Psych PSYCHIATRIC DISORDERS Anxiety Schizophrenia negative neurological ROS     GI/Hepatic negative GI ROS, Neg liver ROS,   Endo/Other  negative endocrine ROS  Renal/GU negative Renal ROS     Musculoskeletal negative musculoskeletal ROS (+)   Abdominal   Peds  Hematology  (+) anemia ,   Anesthesia Other Findings   Reproductive/Obstetrics negative OB ROS                             Anesthesia Physical Anesthesia Plan  ASA: II  Anesthesia Plan: General   Post-op Pain Management:    Induction: Intravenous  PONV Risk Score and Plan: 2  Airway Management Planned: Oral ETT  Additional Equipment:   Intra-op Plan:   Post-operative Plan: Extubation in OR  Informed Consent: I have reviewed the patients History and Physical, chart, labs and discussed the  procedure including the risks, benefits and alternatives for the proposed anesthesia with the patient or authorized representative who has indicated his/her understanding and acceptance.   Dental advisory given  Plan Discussed with: CRNA  Anesthesia Plan Comments:        Anesthesia Quick Evaluation

## 2017-10-11 NOTE — Progress Notes (Signed)
Transported to OR via pt bed. IVF saline locked.

## 2017-10-11 NOTE — Interval H&P Note (Signed)
History and Physical Interval Note:  10/11/2017 8:33 AM  Jonathan Meadows  has presented today for surgery, with the diagnosis of ABSCESS, LEFT SHOULDER  The various methods of treatment have been discussed with the patient and family. After consideration of risks, benefits and other options for treatment, the patient has consented to  Procedure(s): ARTHROSCOPY SHOULDER, LEFT (Left) as a surgical intervention .  The patient's history has been reviewed, patient examined, no change in status, stable for surgery.  I have reviewed the patient's chart and labs.  Questions were answered to the patient's satisfaction.     Newt Minion

## 2017-10-11 NOTE — Anesthesia Postprocedure Evaluation (Signed)
Anesthesia Post Note  Patient: Jonathan Meadows  Procedure(s) Performed: ARTHROSCOPY SHOULDER, LEFT (Left Shoulder)     Patient location during evaluation: PACU Anesthesia Type: General Level of consciousness: sedated and patient cooperative Pain management: pain level controlled Vital Signs Assessment: post-procedure vital signs reviewed and stable Respiratory status: spontaneous breathing Cardiovascular status: stable Anesthetic complications: no    Last Vitals:  Vitals:   10/11/17 1530 10/11/17 1634  BP:  (!) 123/92  Pulse:  85  Resp: (!) 28   Temp:  37 C  SpO2:  99%    Last Pain:  Vitals:   10/11/17 1800  TempSrc:   PainSc: [place]

## 2017-10-12 ENCOUNTER — Encounter (HOSPITAL_COMMUNITY): Payer: Self-pay | Admitting: Orthopedic Surgery

## 2017-10-12 DIAGNOSIS — M00812 Arthritis due to other bacteria, left shoulder: Secondary | ICD-10-CM

## 2017-10-12 DIAGNOSIS — R450 Nervousness: Secondary | ICD-10-CM

## 2017-10-12 DIAGNOSIS — Z91018 Allergy to other foods: Secondary | ICD-10-CM

## 2017-10-12 DIAGNOSIS — R51 Headache: Secondary | ICD-10-CM

## 2017-10-12 DIAGNOSIS — F329 Major depressive disorder, single episode, unspecified: Secondary | ICD-10-CM

## 2017-10-12 DIAGNOSIS — R683 Clubbing of fingers: Secondary | ICD-10-CM

## 2017-10-12 DIAGNOSIS — I890 Lymphedema, not elsewhere classified: Secondary | ICD-10-CM

## 2017-10-12 DIAGNOSIS — R509 Fever, unspecified: Secondary | ICD-10-CM

## 2017-10-12 DIAGNOSIS — F1411 Cocaine abuse, in remission: Secondary | ICD-10-CM

## 2017-10-12 DIAGNOSIS — R5381 Other malaise: Secondary | ICD-10-CM

## 2017-10-12 DIAGNOSIS — F419 Anxiety disorder, unspecified: Secondary | ICD-10-CM

## 2017-10-12 DIAGNOSIS — R5383 Other fatigue: Secondary | ICD-10-CM

## 2017-10-12 DIAGNOSIS — F200 Paranoid schizophrenia: Secondary | ICD-10-CM

## 2017-10-12 LAB — BASIC METABOLIC PANEL
Anion gap: 8 (ref 5–15)
BUN: 15 mg/dL (ref 6–20)
CHLORIDE: 99 mmol/L — AB (ref 101–111)
CO2: 27 mmol/L (ref 22–32)
Calcium: 8.1 mg/dL — ABNORMAL LOW (ref 8.9–10.3)
Creatinine, Ser: 1.07 mg/dL (ref 0.61–1.24)
GFR calc Af Amer: >60 mL/min (ref >60)
GFR calc non Af Amer: >60 mL/min (ref >60)
Glucose, Bld: 138 mg/dL — ABNORMAL HIGH (ref 65–99)
POTASSIUM: 4.7 mmol/L (ref 3.5–5.1)
SODIUM: 134 mmol/L — AB (ref 135–145)

## 2017-10-12 LAB — VANCOMYCIN, TROUGH: Vancomycin Tr: 12 ug/mL — ABNORMAL LOW (ref 15–20)

## 2017-10-12 LAB — CBC
HCT: 31.8 % — ABNORMAL LOW (ref 39.0–52.0)
HEMOGLOBIN: 10.8 g/dL — AB (ref 13.0–17.0)
MCH: 30.9 pg (ref 26.0–34.0)
MCHC: 34 g/dL (ref 30.0–36.0)
MCV: 90.9 fL (ref 78.0–100.0)
Platelets: 488 10*3/uL — ABNORMAL HIGH (ref 150–400)
RBC: 3.5 MIL/uL — AB (ref 4.22–5.81)
RDW: 12.7 % (ref 11.5–15.5)
WBC: 18.1 10*3/uL — AB (ref 4.0–10.5)

## 2017-10-12 MED ORDER — LINEZOLID 600 MG PO TABS
600.0000 mg | ORAL_TABLET | Freq: Two times a day (BID) | ORAL | Status: DC
  Administered 2017-10-12 – 2017-10-13 (×2): 600 mg via ORAL
  Filled 2017-10-12 (×3): qty 1

## 2017-10-12 NOTE — Progress Notes (Signed)
Patient ID: Jonathan Meadows, male   DOB: Jun 15, 1971, 47 y.o.   MRN: [id number] Patient is postoperative day 1 left shoulder arthroscopy with debridement and decompression.  Fluid cultures intraoperatively Gram stain is negative for bacteria.  Cultures in progress continue IV antibiotics until intraoperative cultures are negative.  Patient will need to work with physical therapy for range of motion of the left shoulder okay to shower and get the incision wet.  Patient will need outpatient physical therapy for the left shoulder and for a lymphedema clinic possibly both could be performed at the same location.

## 2017-10-12 NOTE — Evaluation (Signed)
Occupational Therapy Evaluation Patient Details Name: Jonathan Meadows MRN: [id number] DOB: 09/01/1970 Today's Date: 10/12/2017    History of Present Illness Pt is a 47 y.o. male admitted 10/08/17 with L shoulder pain; MRI showed joint effusion with cultures positive for gram-positive cocci. S/p L shoulder arthroscopy on 4/14. PMH includes paranoid schizophrenia, cellulitis, anemia.    Clinical Impression   PTA, pt was independent with ADL and functional mobility. Pt not forthcoming with home set-up information but does report that he lives alone in an apartment. Pt currently requiring min assist for UB bathing and dressing tasks as well as min guard assist for LB ADL. Initiated education concerning compensatory strategies for safe ADL participation and he verbalizes understanding. Additionally initiated L UE ROM this session. Pt would benefit from outpatient OT/PT for L shoulder rehabilitation to maximize functional use of LUE.      Follow Up Recommendations  Follow surgeon's recommendation for DC plan and follow-up therapies;Outpatient OT(outpatient OT vs PT for shouklder rehabilitation)    Equipment Recommendations  None recommended by OT    Recommendations for Other Services       Precautions / Restrictions Precautions Required Braces or Orthoses: Sling(sling in room; no order for sling) Restrictions Weight Bearing Restrictions: No Other Position/Activity Restrictions: Per MD, no weight bearing restrictions of L UE      Mobility Bed Mobility               General bed mobility comments: OOB ambulating in hallway on my arrival  Transfers Overall transfer level: Needs assistance Equipment used: None Transfers: Sit to/from Stand Sit to Stand: Supervision         General transfer comment: Supervision for safety    Balance Overall balance assessment: Mild deficits observed, not formally tested                                         ADL either  performed or assessed with clinical judgement   ADL Overall ADL's : Needs assistance/impaired Eating/Feeding: Supervision/ safety;Sitting   Grooming: Min guard;Standing   Upper Body Bathing: Sitting;Minimal assistance   Lower Body Bathing: Min guard;Sit to/from stand   Upper Body Dressing : Minimal assistance;Sitting   Lower Body Dressing: Min guard;Sit to/from stand   Toilet Transfer: Supervision/safety;Ambulation   Toileting- Clothing Manipulation and Hygiene: Supervision/safety;Sit to/from stand       Functional mobility during ADLs: Supervision/safety General ADL Comments: Pt ambulating in hallway on my arrival. Able to don socks without physical assistance. Assistance for UB dressing tasks. Educated concerning safe use of LUE for ADL participation.      Vision Patient Visual Report: No change from baseline Vision Assessment?: No apparent visual deficits     Perception     Praxis      Pertinent Vitals/Pain Pain Assessment: Faces Faces Pain Scale: Hurts little more Pain Location: L shoulder with movement Pain Descriptors / Indicators: Aching;Discomfort;Operative site guarding;Sore Pain Intervention(s): Limited activity within patient's tolerance;Monitored during session;Repositioned     Hand Dominance     Extremity/Trunk Assessment Upper Extremity Assessment Upper Extremity Assessment: LUE deficits/detail LUE Deficits / Details: decreased AROM at shoulder; 0-15 degrees only for L shoulder abduction and 0-10 degrees only L shoulder flexion; able to complete lap slides, elbow flexion/extension AROM in tact, and wrist/hand AROM in tact   Lower Extremity Assessment Lower Extremity Assessment: Defer to PT evaluation  Communication Communication Communication: No difficulties   Cognition Arousal/Alertness: Awake/alert Behavior During Therapy: WFL for tasks assessed/performed Overall Cognitive Status: No family/caregiver present to determine baseline  cognitive functioning                                 General Comments: Pt functional but becomes easily frustrated.    General Comments       Exercises Exercises: Other exercises Other Exercises Other Exercises: Educated pt concerning lap slides, elbow/wrist/hand AROM exercises.  Other Exercises: Pt able to complete 0-10 degrees PROM and AROM in L shoulder flexion, and 0-15 degrees PROM and AROM in L shoulder abduction; awaiting clarification from MD concerning ROM parameters   Shoulder Instructions      Home Living Family/patient expects to be discharged to:: Private residence Living Arrangements: Alone   Type of Home: Apartment                           Additional Comments: Pt not forthcoming with home set-up details. Will continue to assess.      Prior Functioning/Environment Level of Independence: Independent                 OT Problem List: Decreased strength;Decreased range of motion;Decreased activity tolerance;Impaired balance (sitting and/or standing);Decreased safety awareness;Decreased knowledge of use of DME or AE;Decreased knowledge of precautions;Pain;Impaired UE functional use      OT Treatment/Interventions: Self-care/ADL training;Therapeutic exercise;Energy conservation;DME and/or AE instruction;Therapeutic activities;Patient/family education;Balance training    OT Goals(Current goals can be found in the care plan section) Acute Rehab OT Goals Patient Stated Goal: go home and start therapy OT Goal Formulation: With patient Time For Goal Achievement: 10/19/17 Potential to Achieve Goals: Good ADL Goals Pt Will Perform Grooming: Independently;standing Pt Will Perform Upper Body Bathing: Independently;sitting Pt Will Perform Upper Body Dressing: Independently;sitting Pt Will Transfer to Toilet: Independently;ambulating;regular height toilet Pt Will Perform Toileting - Clothing Manipulation and hygiene: Independently;sit to/from  stand Pt/caregiver will Perform Home Exercise Program: Increased ROM;Left upper extremity;Independently;With written HEP provided  OT Frequency: Min 2X/week   Barriers to D/C:            Co-evaluation              AM-PAC PT "6 Clicks" Daily Activity     Outcome Measure Help from another person eating meals?: None Help from another person taking care of personal grooming?: A Little Help from another person toileting, which includes using toliet, bedpan, or urinal?: A Little Help from another person bathing (including washing, rinsing, drying)?: A Little Help from another person to put on and taking off regular upper body clothing?: A Little Help from another person to put on and taking off regular lower body clothing?: A Little 6 Click Score: 19   End of Session Equipment Utilized During Treatment: (sling) Nurse Communication: Mobility status;Weight bearing status  Activity Tolerance: Patient tolerated treatment well Patient left: in bed;with call bell/phone within reach;with nursing/sitter in room(seated at EOB )  OT Visit Diagnosis: Other abnormalities of gait and mobility (R26.89);Pain Pain - Right/Left: Left Pain - part of body: Shoulder                Time: 9417-4081 OT Time Calculation (min): 18 min Charges:  OT General Charges $OT Visit: 1 Visit OT Evaluation $OT Eval Moderate Complexity: 1 Mod G-Codes:     Kolby Myung A Jamoni Hewes, MS OTR/L  Pager: [place] 10/12/2017, 2:20 PM

## 2017-10-12 NOTE — Progress Notes (Addendum)
Subjective:  Patient reports some pain in his shoulder after his procedure. Was able to get around and shower this morning.  Denies fevers, nausea, vomiting. Denies chest pain.  Objective:  Vital signs in last 24 hours: Vitals:   10/12/17 0100 10/12/17 0200 10/12/17 0238 10/12/17 0300  BP:      Pulse:      Resp: (!) 21 (!) 21 (!) 25 (!) 22  Temp:      TempSrc:      SpO2:      Weight:      Height:         Physical Exam  Constitutional: He is well-developed, well-nourished, and in no distress.  Cardiovascular: Normal rate, regular rhythm and normal heart sounds. Exam reveals no gallop and no friction rub.  No murmur heard. Severe Clubbing  Pulmonary/Chest: Effort normal and breath sounds normal. No respiratory distress. He has no wheezes. He has no rales.  Musculoskeletal:  2+ edema on right leg up to thigh Exam of Left shoulder limited by pain  Skin: Skin is warm and dry. No erythema.    Assessment/Plan:  Principal Problem:   Effusion of left shoulder joint Active Problems:   Chest pain   Cellulitis   Adhesive capsulitis of left shoulder   Impingement syndrome of left shoulder  Left glenohumeral septic arthritis: Had glenohumeral joint aspiration 4/13 showing over 160,000 wbc, predominantly gram positive cocci in pairs/clusters.  Will take off zosyn.  Patient had left shoulder arthroscopy this morning that also showed adhesive capsulitis. Cultures sent.  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  Pt has severe clubbing of fingers for an unknown reason does not have evidence of interstitial lung disease on CT, no real  heart failure, no COPD.  No other signs or symptoms consistent with endocarditis.   -pt has had chronic lymphedema with recurrent cellulitis of right leg, could at one time have spread  -will consult with ID about pt  Chest pain: Resolved.  Likely musculoskeletal in nature as it is reproducible on exam.  cardiology was consulted due to spurious troponin results with elevations, recommended to no longer trend.  ECHO shows EF 55-60%, G1DD  -d/c cardiac monitoring as pt has had no further chest pain and unlikely to have ischemia -Voltaren gel -Tylenol PRN   Right lower extremity nonpurulent cellulitis: He has a history of chronic lymphedema of the right lower extremity. Cellulitis resolved.  Currently on vanc for infected shoulder joint.  -CBC    Chronic lymphedema right lower extremity: pt followed by vascular surgery at [facility].  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 Resolved.   -Continue to monitor BMP and replete potassium as needed    Paranoid schizophrenia: Not on any home medications.  -Continue to monitor -PCP follow-up after discharge   Dispo: Anticipated discharge pending clinical improvement.   Katherine Roan, MD 10/12/2017, 6:39 AM Vickki Muff MD PGY-1  Internal Medicine Pager # [phone]

## 2017-10-12 NOTE — Consult Note (Addendum)
Date of Admission:  10/08/2017          Reason for Consult: Septic left shoulder   Referring Provider: Dr. Dareen Piano   Assessment: 1.  Septic left shoulder with gram-positive organism seen on Gram stain but cultures no growth on antibiotics 2.  Chronic lymphedema in the right lower extremity 3.  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  Inability to self administer antibiotics at home  Plan: 1. Change to oral Zyvox 600 mg twice daily and ensure that he has a 1 month supply 2. He will need to have a CBC with differential in the next 7-10 days 3. Hopefully we can get him through a month of Zyvox and potentially give him another drug orally to finish out a 6-week course  Principal Problem:   Effusion of left shoulder joint Active Problems:   Chest pain   Cellulitis   Adhesive capsulitis of left shoulder   Impingement syndrome of left shoulder   Scheduled Meds: . diclofenac sodium  2 g Topical QID  . docusate sodium  100 mg Oral BID  . enoxaparin (LOVENOX) injection  40 mg Subcutaneous Q24H  . furosemide  20 mg Oral Daily  . linezolid  600 mg Oral Q12H  . ramelteon  8 mg Oral QHS   Continuous Infusions: . sodium chloride 10 mL/hr at 10/11/17 1356  . methocarbamol (ROBAXIN)  IV     PRN Meds:.acetaminophen **OR** acetaminophen, bisacodyl, HYDROcodone-acetaminophen, HYDROcodone-acetaminophen, ketorolac, magnesium citrate, methocarbamol **OR** methocarbamol (ROBAXIN)  IV, metoCLOPramide **OR** metoCLOPramide (REGLAN) injection, morphine injection, ondansetron **OR** ondansetron (ZOFRAN) IV, polyethylene glycol  HPI: Jonathan Meadows is a 47 y.o. male differentiated schizophrenia and chronic right lower extremity lymphedema of uncertain etiology presents with severe left-sided shoulder pain.  He was found to have a septic shoulder with her and 66,000 white blood cells seen on fluid analysis with 94% neutrophils.  Unfortunately had been given vancomycin in the ER to treat  "cellulitis".  Gram stain showed gram-positive cocci in clusters but the cultures have been without growth to date.  He is status post surgery by Dr. Sharol Given with intraoperative cultures having been obtained as well but these are also not revealing a specific organism due to the fact he was already on antibiotics.  The patient lives by himself and does not have a family member who could help him with administration of IV antibiotics.  And that his left arm is in a sling self administration seems very impractical.  Therefore we will try to treat him with a highly bioavailable antibiotic that should be active against MRSA MSSA and streptococcal species namely Zyvox 600 mg twice daily.  Would ask the case management ensure that he gets a 4-week supply of this and we can then try to either extend this to 6 weeks or replace the Zyvox with another active drug against gram-positive organisms.  Certainly if he grows a specific organism that will help inform further therapy.  For now have not given the limitations we have around trying to get him bioavailable drugs I think Zyvox is the best option.  Note he is not on any antidepressants or drugs for his schizophrenia that would have interactions with the Zyvox which has MAOI properties.  I will arrange hospital follow-up for him in our clinic in the next few weeks.  I will otherwise sign off please call us back should new data come to light with regards to the culture or his clinical situation.     Review  of Systems: Review of Systems  Constitutional: Positive for fever and malaise/fatigue. Negative for chills and weight loss.  HENT: Negative for congestion and sore throat.   Eyes: Negative for blurred vision and photophobia.  Respiratory: Negative for cough, shortness of breath and wheezing.   Cardiovascular: Negative for chest pain, palpitations and leg swelling.  Gastrointestinal: Negative for abdominal pain, blood in stool, constipation, diarrhea,  heartburn, melena, nausea and vomiting.  Genitourinary: Negative for dysuria, flank pain and hematuria.  Musculoskeletal: Positive for joint pain and myalgias. Negative for back pain and falls.  Skin: Positive for rash. Negative for itching.  Neurological: Positive for headaches. Negative for dizziness, focal weakness, loss of consciousness and weakness.  Endo/Heme/Allergies: Does not bruise/bleed easily.  Psychiatric/Behavioral: Positive for depression. Negative for suicidal ideas. The patient is nervous/anxious. The patient does not have insomnia.     Past Medical History:  Diagnosis Date  . Anemia   . Cellulitis   . Homelessness   . Lymphedema of leg   . Paranoid schizophrenia ([place])     Social History   Tobacco Use  . Smoking status: Never Smoker  . Smokeless tobacco: Never Used  Substance Use Topics  . Alcohol use: No  . Drug use: No    Family History  Problem Relation Age of Onset  . CAD Unknown        Neg HX  . Hypertension Unknown        Neg Hx  . Diabetes Unknown        Neg Hx  . Cancer Unknown        Neg Hx   Allergies  Allergen Reactions  . Pork-Derived Products Nausea And Vomiting    OBJECTIVE: Blood pressure (!) 123/92, pulse 85, temperature 98.6 F (37 C), temperature source Oral, resp. rate (!) 22, height 6\' 1"  (1.854 m), weight 218 lb 14.4 oz (99.3 kg), SpO2 99 %.  Physical Exam  Constitutional: He is oriented to person, place, and time. He appears well-developed and well-nourished. He is cooperative. He does not appear ill. No distress.  HENT:  Head: Normocephalic and atraumatic.  Right Ear: Hearing and external ear normal.  Left Ear: Hearing and external ear normal.  Nose: No rhinorrhea or nasal deformity. No epistaxis.  Eyes: Pupils are equal, round, and reactive to light. Conjunctivae and EOM are normal. Right conjunctiva is not injected. Left conjunctiva is not injected. No scleral icterus.  Neck: Normal range of motion. Neck supple. No JVD  present.  Cardiovascular: Normal rate, regular rhythm, S1 normal, S2 normal and normal heart sounds. Exam reveals no friction rub.  No murmur heard. Abdominal: Soft. Normal appearance and bowel sounds are normal. He exhibits no distension and no ascites. There is no hepatosplenomegaly. There is no tenderness.  Musculoskeletal: Normal range of motion.       Right shoulder: Normal.       Left shoulder: Normal.       Right hip: Normal.       Left hip: Normal.       Right knee: Normal.       Left knee: Normal.  Lymphadenopathy:       Head (right side): No submandibular, no preauricular and no posterior auricular adenopathy present.       Head (left side): No submandibular, no preauricular and no posterior auricular adenopathy present.    He has no cervical adenopathy.       Right cervical: No superficial cervical and no deep cervical adenopathy present.  Left cervical: No superficial cervical and no deep cervical adenopathy present.  Neurological: He is alert and oriented to person, place, and time. He has normal strength. No sensory deficit. Coordination and gait normal.  Skin: Skin is warm, dry and intact. No abrasion, no bruising, no ecchymosis and no lesion noted. He is not diaphoretic. No cyanosis. Nails show no clubbing.  Psychiatric: His speech is normal and behavior is normal. Judgment and thought content normal. His mood appears anxious. Cognition and memory are normal.  He was walking the halls impatiently He is attentive.    Lab Results Lab Results  Component Value Date   WBC 18.1 (H) 10/12/2017   HGB 10.8 (L) 10/12/2017   HCT 31.8 (L) 10/12/2017   MCV 90.9 10/12/2017   PLT 488 (H) 10/12/2017    Lab Results  Component Value Date   CREATININE 1.07 10/12/2017   BUN 15 10/12/2017   NA 134 (L) 10/12/2017   K 4.7 10/12/2017   CL 99 (L) 10/12/2017   CO2 27 10/12/2017    Lab Results  Component Value Date   ALT 29 10/08/2017   AST 32 10/08/2017   ALKPHOS 69 10/08/2017     BILITOT 1.3 (H) 10/08/2017     Microbiology: Recent Results (from the past 240 hour(s))  Body fluid culture     Status: None (Preliminary result)   Collection Time: 10/10/17  2:26 PM  Result Value Ref Range Status   Specimen Description JOINT FLUID LEFT SHOULDER  Final   Special Requests NONE  Final   Gram Stain   Final    ABUNDANT WBC PRESENT, PREDOMINANTLY PMN RARE GRAM POSITIVE COCCI IN PAIRS IN CLUSTERS    Culture   Final    NO GROWTH 2 DAYS Performed at [facility], [place] [address] [place], [place] [address]    Report Status PENDING  Incomplete  Aerobic/Anaerobic Culture (surgical/deep wound)     Status: None (Preliminary result)   Collection Time: 10/11/17  9:23 AM  Result Value Ref Range Status   Specimen Description SYNOVIAL FLUID LEFT SHOULDER  Final   Special Requests NONE  Final   Gram Stain   Final    FEW WBC PRESENT,BOTH PMN AND MONONUCLEAR NO ORGANISMS SEEN    Culture   Final    NO GROWTH 1 DAY Performed at [facility], [place] [address], [place], [place] [address]    Report Status PENDING  Incomplete    Alcide Evener, [facility] [phone] pager  10/12/2017, 5:08 PM

## 2017-10-12 NOTE — Progress Notes (Signed)
Pharmacy Antibiotic Note  Jonathan Meadows is a 47 y.o. male admitted on 10/08/2017 with Lshoulder septic arthiritis for Vancomycin.  Vancomycin Trough this morning within therapeutic range     Plan: Continue vancomycin 1 g IV q8h  Height: 6\' 1"  (185.4 cm) Weight: 218 lb 14.4 oz (99.3 kg) IBW/kg (Calculated) : 79.9  Temp (24hrs), Avg:98.2 F (36.8 C), Min:97.2 F (36.2 C), Max:99.4 F (37.4 C)  Recent Labs  Lab 10/08/17 0330 10/08/17 1738 10/08/17 2049 10/09/17 0527 10/10/17 0435 10/11/17 1904 10/12/17 0228  WBC 13.1*  --   --  10.0 11.7*  --  18.1*  CREATININE 1.02 1.05 0.96 1.03 0.97  --  1.07  VANCOTROUGH  --   --   --   --   --  38* 12*    Estimated Creatinine Clearance: 105.9 mL/min (by C-G formula based on SCr of 1.07 mg/dL).    Antimicrobials this admission: Vanc 4/13>> Zosyn 4/13>> Cefazolin 4/11>>4/13   Microbiology results: Joint fluid culture 4/13>>  Phillis Knack, PharmD, BCPS  10/12/2017 3:42 AM

## 2017-10-12 NOTE — Progress Notes (Signed)
PT Cancellation Note  Patient Details Name: Iran Rowe MRN: [id number] DOB: Aug 21, 1970   Cancelled Treatment:    Reason Eval/Treat Not Completed: Other (comment). Pt screened with no acute PT needs identified; has been indep ambulating around unit. Currently with OT for acute evaluation and shoulder ROM. Recommend outpatient PT/OT follow-up.  Mabeline Caras, PT, DPT Acute Rehab Services  Pager: [phone] 10/12/2017, 10:45 AM

## 2017-10-12 NOTE — Plan of Care (Signed)
  Problem: Elimination: Goal: Will not experience complications related to bowel motility Outcome: Progressing Pt reported BM today

## 2017-10-12 NOTE — Care Management Note (Addendum)
Case Management Note  Patient Details  Name: Jonathan Meadows MRN: [id number] Date of Birth: October 04, 1970  Subjective/Objective:   Pt presented for left shoulder pain- Adhesive capsulitis of left shoulder-s/p aspiration. PTA from home alone. Pt states he lives in an apartment [address] [place] [address]. Contact # [phone]. Pt's PCP-Dr. Delena Bali @ the [facility] placed on AVS-office to call the patient with an appointment.                 Action/Plan: CM did reach out to [facility] to see if they can follow the patient in the community. [facility] will make referral to [facility]. CM did place the information on AVS- to make sure that pt will get information switched on his Medicaid Card. Pt will need Medicaid Transportation set up. CM did make CSW aware and hopefully she can assist and if not [facility] can assist. No further needs from CM at this time.    Expected Discharge Date:                  Expected Discharge Plan:  Home/Self Care  In-House Referral:  Clinical Social Work(Transportation Needs- Medicaid Tx )  Discharge planning Services CM Consult- See other Comment: [facility] to send new referral to [facility] for community assistance    Post Acute Care Choice:  NA Choice offered to:  NA  DME Arranged:  N/A DME Agency:  NA  HH Arranged:  NA HH Agency:  NA  Status of Service:  Completed, signed off  If discussed at [place] of Stay Meetings, dates discussed:    Additional Comments: 1132 10-13-17 Jacqlyn Krauss, RN,BSN [phone] CM did call the [place] and Medication Zyvox will be $3.00. Rx was sent via MD and medication is in stock. Outpatient OT to call the patient to make sure if Medicaid will cover visits. Referral sent to [facility]. No further needs from CM at this time.  Bethena Roys, RN 10/12/2017, 10:44 AM

## 2017-10-13 ENCOUNTER — Telehealth (INDEPENDENT_AMBULATORY_CARE_PROVIDER_SITE_OTHER): Payer: Self-pay | Admitting: Orthopedic Surgery

## 2017-10-13 DIAGNOSIS — I872 Venous insufficiency (chronic) (peripheral): Secondary | ICD-10-CM

## 2017-10-13 LAB — SEDIMENTATION RATE: Sed Rate: 125 mm/hr — ABNORMAL HIGH (ref 0–16)

## 2017-10-13 LAB — C-REACTIVE PROTEIN: CRP: 13 mg/dL — ABNORMAL HIGH (ref <1.0)

## 2017-10-13 MED ORDER — LINEZOLID 600 MG PO TABS: 600.0000 mg | ORAL_TABLET | Freq: Two times a day (BID) | ORAL | 0 refills | Status: DC

## 2017-10-13 MED ORDER — OXYCODONE-ACETAMINOPHEN 10-325 MG PO TABS: 1.0000 | ORAL_TABLET | Freq: Four times a day (QID) | ORAL | 0 refills | Status: AC | PRN

## 2017-10-13 MED FILL — LINEZOLID 600 MG TAB: 600 | 27 days supply | Qty: 54 | Fill #0

## 2017-10-13 MED FILL — OXYCODONE-ACETAMINOPHEN 10-: 10-325 | 5 days supply | Qty: 20 | Fill #0

## 2017-10-13 NOTE — Progress Notes (Signed)
Discharge instructions given to pt. Pt very argumentative. Stressed importance of follow ups and keeping wound clean and dry. Pt was discharged home with zyvox and percocet from outpt pharmacy. Discharged via cab to current resident. Carroll Kinds RN

## 2017-10-13 NOTE — Clinical Social Work Note (Signed)
Cab voucher filled out and given to RN.  CSW signing off.   Dayton Scrape, [place]

## 2017-10-13 NOTE — Progress Notes (Signed)
Dressing removed from left shoulder, cleansed with saline and band aids applied. Carroll Kinds RN

## 2017-10-13 NOTE — Progress Notes (Addendum)
   Subjective:  Patient reports some pain in his shoulder after his procedure but with continued improvement.  He denies any chest pain or shortness of breath.    Objective:  Vital signs in last 24 hours: Vitals:   10/12/17 0300 10/12/17 2118 10/13/17 0516 10/13/17 1349  BP:  (!) 131/91 (!) 135/98 114/74  Pulse:  97 91 99  Resp: (!) 22 20 18 18   Temp:  98.6 F (37 C) 98.2 F (36.8 C) 98.6 F (37 C)  TempSrc:  Oral Oral Oral  SpO2:  100% 99% 98%  Weight:   221 lb 3.2 oz (100.3 kg)   Height:         Physical Exam  Constitutional: He is well-developed, well-nourished, and in no distress.  Cardiovascular: Normal rate, regular rhythm and normal heart sounds. Exam reveals no gallop and no friction rub.  No murmur heard. Severe Clubbing  Pulmonary/Chest: Effort normal and breath sounds normal. No respiratory distress. He has no wheezes. He has no rales.  Musculoskeletal:  2+ edema on right leg up to thigh Exam of Left shoulder limited by pain  Skin: Skin is warm and dry. No erythema.    Assessment/Plan:  Principal Problem:   Effusion of left shoulder joint Active Problems:   Chest pain   Cellulitis   Adhesive capsulitis of left shoulder   Impingement syndrome of left shoulder  Left glenohumeral septic arthritis: Had glenohumeral joint aspiration 4/13 showing over 160,000 wbc, predominantly gram positive cocci in pairs/clusters.  Will take off zosyn.  Patient had left shoulder arthroscopy this morning that also showed adhesive capsulitis. Cultures sent.  Will await results and get IDs input for treatment regimen and duration  -will continue zyvox 600mg  BID for one month, pt to follow up in infectious disease clinic -follow up with Dr. Sharol Given outpatient  -aspiration culture no growth to date  Chest pain: Resolved.  Likely musculoskeletal in nature as it is reproducible on exam.  cardiology was consulted due to spurious troponin results with elevations, recommended to no longer  trend.  ECHO shows EF 55-60%, G1DD  -Voltaren gel -Tylenol PRN   Right lower extremity nonpurulent cellulitis: He has a history of chronic lymphedema of the right lower extremity. Cellulitis resolved.  Currently on vanc for infected shoulder joint.  -CBC    Chronic lymphedema right lower extremity: pt followed by vascular surgery at [facility].  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 Resolved.   -Continue to monitor BMP and replete potassium as needed    Paranoid schizophrenia: Not on any home medications.  -Continue to monitor -PCP follow-up after discharge   Dispo: Anticipated discharge today as pt medically stable.    Katherine Roan, MD 10/13/2017, 5:26 PM Vickki Muff MD PGY-1 Internal Medicine Pager # [phone]

## 2017-10-13 NOTE — Telephone Encounter (Addendum)
Charity OP therapist with [facility] called needing range of motion clarification. Patient maybe discharged today. The number to contact [place] is [phone]

## 2017-10-13 NOTE — Progress Notes (Signed)
Linezolid 600mg  BID x 27 days filled at [facility] outpatient pharmacy. I delivered this to Mr. College and educated him about side effects and administration instructions.  I also reinforced the importance of following up with the infectious diseases clinic for monitoring.  Heide Guile, PharmD, BCPS-AQ ID Clinical Pharmacist Pager [phone]

## 2017-10-13 NOTE — Progress Notes (Signed)
Occupational Therapy Treatment Patient Details Name: Jonathan Meadows MRN: [id number] DOB: October 16, 1970 Today's Date: 10/13/2017    History of present illness Pt is a 47 y.o. male admitted 10/08/17 with L shoulder pain; MRI showed joint effusion with cultures positive for gram-positive cocci. S/p L shoulder arthroscopy on 4/14. PMH includes paranoid schizophrenia, cellulitis, anemia.    OT comments  Pt demonstrating progress toward OT goals. Continued facilitation of improved functional use of L UE for ADL participation with AAROM activities and lap slides this session. OT called [facility] for clarification concerning ROM restrictions for AROM and await further clarification. Pt continues to require mod assist for sling management this session. Continue to recommend outpatient therapy services post-acute D/C for L shoulder rehabilitation in preparation for improved ADL independence. Will continue to follow while admitted.    Follow Up Recommendations  Follow surgeon's recommendation for DC plan and follow-up therapies;Outpatient OT(Outpatient OT vs PT for shoulder rehabilitation)    Equipment Recommendations  None recommended by OT    Recommendations for Other Services      Precautions / Restrictions Precautions Required Braces or Orthoses: Sling(pt wearing for comfort; no order in chart) Restrictions Weight Bearing Restrictions: No Other Position/Activity Restrictions: Per MD, no weight bearing restrictions of L UE       Mobility Bed Mobility                  Transfers                      Balance                                           ADL either performed or assessed with clinical judgement   ADL Overall ADL's : Needs assistance/impaired Eating/Feeding: Supervision/ safety;Sitting               Upper Body Dressing : Moderate assistance Upper Body Dressing Details (indicate cue type and reason): Mod assist for sling management.                     General ADL Comments: Session focused on increased functional use of LUE as a preparation for improved ADL independence.      Vision   Vision Assessment?: No apparent visual deficits   Perception     Praxis      Cognition Arousal/Alertness: Awake/alert Behavior During Therapy: WFL for tasks assessed/performed Overall Cognitive Status: No family/caregiver present to determine baseline cognitive functioning                                 General Comments: Pt functional but becomes easily frustrated.         Exercises Exercises: Shoulder Shoulder Exercises Shoulder Flexion: AAROM;Left;10 reps;Supine(0-10) Shoulder ABduction: AAROM;Left;10 reps;Seated(0-15) Shoulder External Rotation: AAROM;Left;10 reps;Seated(0-30) Elbow Flexion: AAROM;Left;10 reps;Seated Wrist Flexion: AROM;Left;10 reps Digit Composite Flexion: AROM;Left;10 reps Composite Extension: AROM;Left;10 reps Other Exercises Other Exercises: Educated pt concerning seated lap slides as well as dangles and seated pendulums.  Other Exercises: All above stated exercises to tolerance only.    Shoulder Instructions Shoulder Instructions Method for sponge bathing under operated UE: Minimal assistance Donning/doffing sling/immobilizer: Moderate assistance Correct positioning of sling/immobilizer: Minimal assistance     General Comments      Pertinent Vitals/ Pain  Pain Assessment: Faces Faces Pain Scale: Hurts even more Pain Location: L shoulder with movement Pain Descriptors / Indicators: Aching;Discomfort;Operative site guarding;Sore Pain Intervention(s): Limited activity within patient's tolerance;Monitored during session;Repositioned  Home Living                                          Prior Functioning/Environment              Frequency  Min 2X/week        Progress Toward Goals  OT Goals(current goals can now be found in the care  plan section)  Progress towards OT goals: Progressing toward goals  Acute Rehab OT Goals Patient Stated Goal: go home and start therapy OT Goal Formulation: With patient Time For Goal Achievement: 10/19/17 Potential to Achieve Goals: Good  Plan Discharge plan remains appropriate    Co-evaluation                 AM-PAC PT "6 Clicks" Daily Activity     Outcome Measure   Help from another person eating meals?: None Help from another person taking care of personal grooming?: A Little Help from another person toileting, which includes using toliet, bedpan, or urinal?: A Little Help from another person bathing (including washing, rinsing, drying)?: A Little Help from another person to put on and taking off regular upper body clothing?: A Little Help from another person to put on and taking off regular lower body clothing?: A Little 6 Click Score: 19    End of Session Equipment Utilized During Treatment: (sling)  OT Visit Diagnosis: Other abnormalities of gait and mobility (R26.89);Pain Pain - Right/Left: Left Pain - part of body: Shoulder   Activity Tolerance Patient tolerated treatment well   Patient Left in bed;with call bell/phone within reach;with nursing/sitter in room   Nurse Communication Mobility status        Time: 1025-1040 OT Time Calculation (min): 15 min  Charges: OT General Charges $OT Visit: 1 Visit OT Treatments $Therapeutic Exercise: 8-22 mins  Norman Herrlich, MS OTR/L  Pager: [place] 10/13/2017, 12:58 PM

## 2017-10-14 LAB — HEPATITIS B SURFACE ANTIGEN: Hepatitis B Surface Ag: NEGATIVE

## 2017-10-14 LAB — BODY FLUID CULTURE: CULTURE: NO GROWTH

## 2017-10-14 LAB — HCV COMMENT:

## 2017-10-14 LAB — HEPATITIS C ANTIBODY (REFLEX): HCV Ab: <0.1 s/co ratio (ref 0.0–0.9)

## 2017-10-14 NOTE — Telephone Encounter (Signed)
Please advise 

## 2017-10-15 NOTE — Telephone Encounter (Signed)
Called and lm on vm to advise of message below. To call with questions.  

## 2017-10-15 NOTE — Telephone Encounter (Signed)
No restrictions with ROM.

## 2017-10-16 LAB — AEROBIC/ANAEROBIC CULTURE (SURGICAL/DEEP WOUND): CULTURE: NO GROWTH

## 2017-10-16 LAB — AEROBIC/ANAEROBIC CULTURE W GRAM STAIN (SURGICAL/DEEP WOUND)

## 2017-10-19 ENCOUNTER — Other Ambulatory Visit: Payer: Self-pay | Admitting: Internal Medicine

## 2017-10-19 DIAGNOSIS — R600 Localized edema: Secondary | ICD-10-CM

## 2017-10-19 DIAGNOSIS — M7502 Adhesive capsulitis of left shoulder: Secondary | ICD-10-CM

## 2017-10-19 NOTE — Progress Notes (Signed)
Will place order for ambulatory referral for PT, my original order for home health PT was cancelled.

## 2017-10-21 ENCOUNTER — Telehealth (INDEPENDENT_AMBULATORY_CARE_PROVIDER_SITE_OTHER): Payer: Self-pay | Admitting: Orthopedic Surgery

## 2017-10-21 NOTE — Telephone Encounter (Signed)
Patient scheduled 10/28/17 at 3:15pm with Dr Sharol Given

## 2017-10-26 ENCOUNTER — Ambulatory Visit: Payer: Medicaid Other | Admitting: Rehabilitative and Restorative Service Providers"

## 2017-10-26 ENCOUNTER — Ambulatory Visit: Payer: Medicaid Other | Admitting: Occupational Therapy

## 2017-10-27 ENCOUNTER — Ambulatory Visit: Payer: Medicaid Other | Admitting: Physical Therapy

## 2017-10-27 ENCOUNTER — Encounter: Payer: Self-pay | Admitting: Physical Therapy

## 2017-10-27 ENCOUNTER — Ambulatory Visit: Payer: Medicaid Other | Attending: Internal Medicine | Admitting: Physical Therapy

## 2017-10-27 ENCOUNTER — Ambulatory Visit (INDEPENDENT_AMBULATORY_CARE_PROVIDER_SITE_OTHER): Payer: Medicaid Other | Admitting: Infectious Disease

## 2017-10-27 ENCOUNTER — Encounter: Payer: Self-pay | Admitting: Infectious Disease

## 2017-10-27 VITALS — BP 108/80 | HR 110 | Temp 98.6°F | Ht 73.0 in | Wt 200.0 lb

## 2017-10-27 DIAGNOSIS — R293 Abnormal posture: Secondary | ICD-10-CM | POA: Insufficient documentation

## 2017-10-27 DIAGNOSIS — M25412 Effusion, left shoulder: Secondary | ICD-10-CM

## 2017-10-27 DIAGNOSIS — M25512 Pain in left shoulder: Secondary | ICD-10-CM | POA: Diagnosis present

## 2017-10-27 DIAGNOSIS — M009 Pyogenic arthritis, unspecified: Secondary | ICD-10-CM | POA: Diagnosis not present

## 2017-10-27 DIAGNOSIS — M25612 Stiffness of left shoulder, not elsewhere classified: Secondary | ICD-10-CM | POA: Insufficient documentation

## 2017-10-27 DIAGNOSIS — R252 Cramp and spasm: Secondary | ICD-10-CM | POA: Insufficient documentation

## 2017-10-27 DIAGNOSIS — M6281 Muscle weakness (generalized): Secondary | ICD-10-CM | POA: Diagnosis present

## 2017-10-27 HISTORY — DX: Pyogenic arthritis, unspecified: M00.9

## 2017-10-27 NOTE — Progress Notes (Signed)
Subjective:    Patient ID: Jonathan Meadows, male    DOB: 1971-06-21, 47 y.o.   MRN: [id number]  HPI  Jonathan Meadows is a 47 y.o. male differentiated schizophrenia and chronic right lower extremity lymphedema of uncertain etiology presents with severe left-sided shoulder pain.  He was found to have a septic shoulder with her and 66,000 white blood cells seen on fluid analysis with 94% neutrophils.  Unfortunately had been given vancomycin in the ER to treat "cellulitis".  Gram stain showed gram-positive cocci in clusters but the cultures have been without growth.  He is status post surgery by Dr. Sharol Given who performed   ARTHROSCOPY SHOULDER, LEFT with subacromial decompression with extensive debridement of the glenohumeral joint with debridement of SLAP lesion debridement of biceps tendon and debridement of adhesions to the subscapularis.    Intraoperative cultures having been obtained as well but these are also not revealing a specific organism either.   Dr. Jess Barters intraoperative notes indicate the patient has significant amount of impingement in the shoulder.  He did not find purulence in the subacromial space.  The patient lives by himself and does not have a family member who could help him with administration of IV antibiotics.  And that his left arm is in a sling self administration seemed impractial.  Therefore we have been treating him with a highly bioavailable antibiotic that should be active against MRSA MSSA and streptococcal species namely Zyvox 600 mg twice daily.  We obtained a 4-week supply for him and he will be due to run out of this in roughly 2 weeks time.  Since he had surgery has had some improvement in his pain but still has had a hard time.  He says he is having a great deal of time working with physical therapy to get his shoulder to move more freely without having significant pain.  He also states that he had run out of his pain medications and asked if I could prescribe  these.  I told him I could not unfortunately because we do not dispense these in the clinic and furthermore he needs to have these filled by his surgeon or a primary care doctor.  I am a bit bother the still having so much pain but I am happy that the patient is going to be seeing Dr. Sharol Given tomorrow.  I will check inflammatory markers in the blood and recheck CBC and a metabolic panel to ensure he has not developed thrombocytopenia.  Past Medical History:  Diagnosis Date  . Anemia   . Cellulitis   . Homelessness   . Lymphedema of leg   . Paranoid schizophrenia ([place])   . Septic joint of left shoulder region [facility]) 10/27/2017    Past Surgical History:  Procedure Laterality Date  . SHOULDER ARTHROSCOPY Left 10/11/2017   Procedure: ARTHROSCOPY SHOULDER, LEFT;  Surgeon: Newt Minion, MD;  Location: [facility];  Service: Orthopedics;  Laterality: Left;    Family History  Problem Relation Age of Onset  . CAD Unknown        Neg HX  . Hypertension Unknown        Neg Hx  . Diabetes Unknown        Neg Hx  . Cancer Unknown        Neg Hx      Social History   Socioeconomic History  . Marital status: Single    Spouse name: Not on file  . Number of children: Not on file  .  Years of education: Not on file  . Highest education level: Not on file  Occupational History  . Not on file  Social Needs  . Financial resource strain: Not on file  . Food insecurity:    Worry: Not on file    Inability: Not on file  . Transportation needs:    Medical: Not on file    Non-medical: Not on file  Tobacco Use  . Smoking status: Never Smoker  . Smokeless tobacco: Never Used  Substance and Sexual Activity  . Alcohol use: No  . Drug use: No  . Sexual activity: Not on file  Lifestyle  . Physical activity:    Days per week: Not on file    Minutes per session: Not on file  . Stress: Not on file  Relationships  . Social connections:    Talks on phone: Not on file    Gets together: Not on file     Attends religious service: Not on file    Active member of club or organization: Not on file    Attends meetings of clubs or organizations: Not on file    Relationship status: Not on file  Other Topics Concern  . Not on file  Social History Narrative  . Not on file    Allergies  Allergen Reactions  . Pork-Derived Products Nausea And Vomiting     Current Outpatient Medications:  .  furosemide (LASIX) 20 MG tablet, Take 20 mg by mouth daily., Disp: , Rfl:  .  linezolid (ZYVOX) 600 MG tablet, Take 1 tablet (600 mg total) by mouth 2 (two) times daily for 27 days., Disp: 54 tablet, Rfl: 0 .  benzonatate (TESSALON) 100 MG capsule, Take 1 capsule (100 mg total) by mouth 3 (three) times daily as needed for cough. (Patient not taking: Reported on 10/27/2017), Disp: 21 capsule, Rfl: 0 .  loperamide (IMODIUM) 2 MG capsule, Take 1 capsule (2 mg total) by mouth 4 (four) times daily as needed for diarrhea or loose stools. (Patient not taking: Reported on 10/27/2017), Disp: 12 capsule, Rfl: 0 .  traMADol (ULTRAM) 50 MG tablet, Take 1 tablet (50 mg total) by mouth every 12 (twelve) hours as needed. (Patient taking differently: Take 50 mg by mouth every 12 (twelve) hours as needed for moderate pain. ), Disp: 10 tablet, Rfl: 0     Review of Systems  Constitutional: Negative for activity change, appetite change, chills, diaphoresis, fatigue, fever and unexpected weight change.  HENT: Negative for congestion, rhinorrhea, sinus pressure, sneezing, sore throat and trouble swallowing.   Eyes: Negative for photophobia and visual disturbance.  Respiratory: Negative for cough, chest tightness, shortness of breath, wheezing and stridor.   Cardiovascular: Negative for chest pain, palpitations and leg swelling.  Gastrointestinal: Negative for abdominal distention, abdominal pain, anal bleeding, blood in stool, constipation, diarrhea, nausea and vomiting.  Genitourinary: Negative for difficulty urinating, dysuria,  flank pain and hematuria.  Musculoskeletal: Positive for arthralgias. Negative for back pain, gait problem, joint swelling and myalgias.  Skin: Negative for color change, pallor, rash and wound.  Neurological: Negative for dizziness, tremors, weakness and light-headedness.  Hematological: Negative for adenopathy. Does not bruise/bleed easily.  Psychiatric/Behavioral: Negative for agitation, behavioral problems, confusion, decreased concentration, dysphoric mood and sleep disturbance.       Objective:   Physical Exam  Constitutional: He is oriented to person, place, and time. He appears well-developed and well-nourished.  HENT:  Head: Normocephalic and atraumatic.  Eyes: Conjunctivae and EOM are normal.  Neck:  Normal range of motion. Neck supple.  Cardiovascular: Normal rate and regular rhythm.  Pulmonary/Chest: Effort normal. No respiratory distress. He has no wheezes.  Abdominal: Soft. He exhibits no distension.  Musculoskeletal: Normal range of motion. He exhibits no edema or tenderness.  Neurological: He is alert and oriented to person, place, and time.  Skin: Skin is warm and dry. No rash noted. No erythema. No pallor.   Left shoulder has significantly reduced range of motion.  He does have some tenderness in the [facility] joint and laterally.  See pictures of the wounds which are healing well October 27, 2017:           Assessment & Plan:   Septic left shoulder + impingement: Tension will check his CBC to ensure he is not getting thrombocytopenic check a metabolic panel and check sed rate and C-reactive protein.  He is seeing Dr. Sharol Given tomorrow.  We will see him in follow-up the following week on the eighth and I have overbooked him.  We will see how he is doing at that point in time and consider extending the Zyvox if he has not developed any toxicities from it versus changing him to oral Bactrim.

## 2017-10-27 NOTE — Patient Instructions (Signed)
OK to overbook pt on the 8th

## 2017-10-27 NOTE — Therapy (Signed)
[place], [place], [address] Phone: [phone]   Fax:  [phone]  Physical Therapy Evaluation  Patient Details  Name: [facility] MRN: [id number] Date of Birth: 06-10-1971 Referring Provider: Sharol Given,   MD   Encounter Date: 10/27/2017  PT End of Session - 10/27/17 1302    Visit Number  1    Number of Visits  4    Date for PT Re-Evaluation  12/08/17 3 visits during initial authorization  until 11-17-17   and then and additional  2 x /week for 4    Authorization Type  MCD- awaiting initial authorization     PT Start Time  1017    PT Stop Time  1130    PT Time Calculation (min)  73 min    Activity Tolerance  Patient limited by pain    Behavior During Therapy  [facility] for tasks assessed/performed       Past Medical History:  Diagnosis Date  . Anemia   . Cellulitis   . Homelessness   . Lymphedema of leg   . Paranoid schizophrenia [facility])     Past Surgical History:  Procedure Laterality Date  . SHOULDER ARTHROSCOPY Left 10/11/2017   Procedure: ARTHROSCOPY SHOULDER, LEFT;  Surgeon: Newt Minion, MD;  Location: [place];  Service: Orthopedics;  Laterality: Left;    There were no vitals filed for this visit.   Subjective Assessment - 10/27/17 1029    Subjective  I was in the hospital from 10/08/17 to 10/13/17 for septic shoulder , surgery was on 10-11-17 with Dr Sharol Given.     Pertinent History  schizophrenia, septic shoulder, chronic right LE edema    Diagnostic tests  x ray    Patient Stated Goals  I want to be able to put my clothes on and bathe myself normallly, cant sleep on my left side    Currently in Pain?  Yes    Pain Score  10-Worst pain ever    Pain Location  Shoulder    Pain Orientation  Left    Pain Descriptors / Indicators  Throbbing;Shooting    Pain Type  Surgical pain    Pain Onset  1 to 4 weeks ago surgery 10-11-17    Pain Frequency  Constant    Aggravating Factors   moving it , bathing and  dressing. household chore         [facility] PT Assessment - 10/27/17 1032      Assessment   Medical Diagnosis  left septic shoulder post arthroscopy    Referring Provider  Sharol Given,   MD    Onset Date/Surgical Date  10/11/17    Hand Dominance  Right semi ambidextrous    Next MD Visit  unknown    Prior Therapy  none  just in hospital post surgery      Precautions   Precaution Comments  SLAP tear and biceps debridement       Restrictions   Weight Bearing Restrictions  No      Balance Screen   Has the patient fallen in the past 6 months  No    Has the patient had a decrease in activity level because of a fear of falling?   No    Is the patient reluctant to leave their home because of a fear of falling?   No      Home Social worker  Private residence    Living Arrangements  Alone  Type of Home  Apartment    Home Access  Stairs to enter;Level entry    Home Layout  One level      Prior Function   Vocation  On disability      Cognition   Overall Cognitive Status  Within Functional Limits for tasks assessed      Observation/Other Assessments   Focus on Therapeutic Outcomes (FOTO)   not taken      Posture/Postural Control   Posture/Postural Control  Postural limitations    Postural Limitations  Flexed trunk;Rounded Shoulders;Forward head    Posture Comments  decreased arm swing      ROM / Strength   AROM / PROM / Strength  AROM;Strength      AROM   Overall AROM   Deficits    Right Shoulder Extension  40 Degrees    Right Shoulder Flexion  155 Degrees    Right Shoulder ABduction  146 Degrees    Right Shoulder Internal Rotation  60 Degrees    Right Shoulder External Rotation  90 Degrees    Left Shoulder Extension  25 Degrees    Left Shoulder Flexion  20 Degrees    Left Shoulder ABduction  26 Degrees    Left Shoulder Internal Rotation  45 Degrees    Left Shoulder External Rotation  0 Degrees    Right Elbow Flexion  130    Right Elbow Extension  0     Left Elbow Flexion  102    Left Elbow Extension  -30      Strength   Overall Strength  Within functional limits for tasks performed    Right Shoulder Flexion  4+/5    Right Shoulder Extension  4+/5    Right Shoulder ABduction  4+/5    Right Shoulder Internal Rotation  4+/5    Right Shoulder External Rotation  4+/5    Left Shoulder Flexion  3-/5    Left Shoulder Extension  3-/5    Left Shoulder ABduction  3-/5    Left Shoulder Internal Rotation  3-/5    Left Shoulder External Rotation  3-/5    Right Elbow Flexion  5/5    Right Elbow Extension  5/5    Left Elbow Flexion  3-/5    Left Elbow Extension  3-/5      Palpation   Palpation comment  marked tenderness over surgical portals for entire left shoulder                 Objective measurements completed on examination: See above findings.      [place] Adult PT Treatment/Exercise - 10/27/17 1032      Shoulder Exercises: Supine   Other Supine Exercises  supine bench press cane       Shoulder Exercises: Seated   Other Seated Exercises  elbow flex/ ext on tablel 10 x ball squeeze x 10  shoulder flex on table with towel x 10      Shoulder Exercises: Standing   Other Standing Exercises  pendulum vertical and horizontal and circles x 10 each      Manual Therapy   Manual Therapy  Soft tissue mobilization;Passive ROM    Soft tissue mobilization  scapular motion / anterior , posteriior    Passive ROM  attempted every plane of motion but pain in every motion              PT Education - 10/27/17 1020    Education provided  Yes  Education Details  Pt given initial HEP and manual to increase active motion of arm left    Person(s) Educated  Patient    Methods  Explanation;Demonstration;Tactile cues;Verbal cues;Handout    Comprehension  Verbalized understanding;Returned demonstration       PT Short Term Goals - 10/27/17 1305      PT SHORT TERM GOAL #1   Title  Pt will be independent with initial HEP    Baseline   Pt not performing any of the initial exercises from hospital    Time  3    Period  Weeks    Status  New    Target Date  11/17/17      PT SHORT TERM GOAL #2   Title  Pt will increase initial AROM of shoulder to at least 70 degrees flexion to increase ease of donning clothing    Baseline  flexion of left arm no greater than 20 degrees and  10/10 pain    Time  3    Period  Weeks    Status  New    Target Date  11/17/17      PT SHORT TERM GOAL #3   Title  Pt will be able to sleep for greater than 4 hours of sleep uninterrupted applying pain management strategies    Baseline  Pt unable to sleep greater than 2 hours at night without pain 10/10 waking every 2 hours    Time  3    Period  Weeks    Status  New    Target Date  11/17/17        PT Long Term Goals - 10/27/17 1309      PT LONG TERM GOAL #1   Title  "Pt will be independent with advanced HEP.     Time  4    Period  Weeks    Status  New    Target Date  12/08/17      PT LONG TERM GOAL #2   Title  Left shoulder IR and ER will return to at least 45 degrees actively  for 50 degrees with greater ease in dressing and grooming    Baseline  Pt unable to dress without major compensations    Time  4    Period  Weeks    Status  New    Target Date  12/08/17      PT LONG TERM GOAL #3   Title  Be able to lift arm to 100 degrees with AROM and lift light items on shelf to perform household chores    Baseline  Pt unable to lift arm past 20 degrees flexion  10/10 pain    Time  4    Period  Weeks    Status  New    Target Date  12/08/17      PT LONG TERM GOAL #4   Title  Pt will be able to sleep on left shoulder at night without waking due to pain    Time  4    Period  Weeks    Status  New    Target Date  12/08/17             Plan - 10/27/17 1032    Clinical Impression Statement  47 yo patient had surgery for septic gram + shoulder on 10-11-17 with DR Sharol Given .Left arthroscopy with subacromial decompression with extensive  debridement of the glenohumeral joint with debridement of SLAP lesion debridement of biceps tendon and debridement of adhesions  to the subscapularis..  Pt enters clinic with severely limited AROM and decreased strength.  Emphasis on importance of movement of left arm to prevent permanent adhesions. Pt presents with severe limitation -30 left elbow extension and unable to move left arm great than 30 degrees in any plane.  Due to scheduling limitation pt will recieve 3 visits in 3 weeks and then an additional 2 x a week for 4 weeks to maximiize function    Clinical Presentation  Stable    Clinical Decision Making  Low    Rehab Potential  Good    PT Frequency  2x / week 4 visits intial authorization period in first 3 weeks, and then additional 2 x  week for 4 weeks reauthorization    PT Duration  4 weeks    PT Treatment/Interventions  Dry needling;Passive range of motion;ADLs/Self Care Home Management;Cryotherapy;Electrical Stimulation;Iontophoresis 4mg /ml Dexamethasone;Moist Heat;Ultrasound;Therapeutic exercise;Therapeutic activities;Neuromuscular re-education;Patient/family education;Manual techniques    PT Next Visit Plan  manual and progress exercises as tolerated    PT Home Exercise Plan  pendulum, Active assist flexion. elbow flex/ext/ supine press up with cane    Consulted and Agree with Plan of Care  Patient       Patient will benefit from skilled therapeutic intervention in order to improve the following deficits and impairments:  Postural dysfunction, Improper body mechanics, Pain, Impaired UE functional use, Increased fascial restricitons, Decreased strength, Increased muscle spasms  Visit Diagnosis: Acute pain of left shoulder  Stiffness of left shoulder, not elsewhere classified  Cramp and spasm  Abnormal posture  Muscle weakness (generalized)     Problem List Patient Active Problem List   Diagnosis Date Noted  . Adhesive capsulitis of left shoulder   . Impingement syndrome  of left shoulder   . Effusion of left shoulder joint 10/10/2017  . Chest pain 10/08/2017  . Cellulitis 10/08/2017  . Schizophrenia, chronic condition with acute exacerbation ([place]) 09/11/2015  . GAD (generalized anxiety disorder)   . Cellulitis and abscess of finger 10/10/2012  . Edema of right lower extremity 10/10/2012  . Anemia 10/10/2012   Voncille Lo, PT Certified Exercise Expert for the Aging Adult  10/27/17 1:40 PM Phone: [phone] Fax: [facility] [address] [place], [place], [address] Phone: [phone]   Fax:  [phone]  Name: Bruk Tumolo MRN: [id number] Date of Birth: June 15, 1971

## 2017-10-27 NOTE — Patient Instructions (Signed)
ROM: Pendulum (Circular)  Let right arm move in circle clockwise, then counterclockwise, by rocking body weight in circular pattern. Circle _10___ times each direction per set. Do _3___ sessions per day.  Pendulum Side to Side  Bend forward 90 at waist, leaning on table for support. Rock body from side to side and let arm swing freely. Repeat _10___ times. Do __3__ sessions per day.  Finger Flexors  Keeping right fingertips straight, press putty toward base of palm. Repeat __20__ times. Do __3__ sessions per day. Activity: Squeeze flour sifter, plastic squeeze bottles, [place] baster, juice from fruit.*  AROM: Elbow Flexion / Extension  With left hand palm up, gently bend elbow as far as possible. Then straighten arm as far as possible. Repeat __10__ times per set. Do __3__ sessions per day.  SHOULDER: Flexion On Table  Place hands on table, elbows straight. Move hips away from body. Press hands down into table. Hold _3__ seconds. _10__ reps per set, __3_ sets per day. Posture - Sitting   Sit upright, head facing forward. Try using a roll to support lower back. Keep shoulders relaxed, and avoid rounded back. Keep hips level with knees. Avoid crossing legs for long periods.  SELF ASSISTED WITH OBJECT: Shoulder Flexion / Elbow Extension (Rolling Pin)    Place hands on rolling pin, straighten arms. _15__ reps per set, _3__ sets per day, ___ days per week      Cane Exercise: Flexion  I want you to push rolling pin or umbrella or stick up. Like a push up on your back/ bench press as best as you can.  Try to do 5 x every hour  Voncille Lo, PT Certified Exercise Expert for the Aging Adult  10/27/17 10:57 AM Phone: [phone] Fax: [phone]

## 2017-10-28 ENCOUNTER — Ambulatory Visit (INDEPENDENT_AMBULATORY_CARE_PROVIDER_SITE_OTHER): Payer: Self-pay | Admitting: Orthopedic Surgery

## 2017-10-28 ENCOUNTER — Ambulatory Visit (INDEPENDENT_AMBULATORY_CARE_PROVIDER_SITE_OTHER): Payer: Self-pay

## 2017-10-28 LAB — COMPLETE METABOLIC PANEL WITH GFR
AG RATIO: 0.6 (calc) — AB (ref 1.0–2.5)
ALT: 16 U/L (ref 9–46)
AST: 16 U/L (ref 10–40)
Albumin: 3.3 g/dL — ABNORMAL LOW (ref 3.6–5.1)
Alkaline phosphatase (APISO): 82 U/L (ref 40–115)
BILIRUBIN TOTAL: 0.3 mg/dL (ref 0.2–1.2)
BUN: 19 mg/dL (ref 7–25)
CALCIUM: 9.3 mg/dL (ref 8.6–10.3)
CHLORIDE: 99 mmol/L (ref 98–110)
CO2: 28 mmol/L (ref 20–32)
Creat: 1.17 mg/dL (ref 0.60–1.35)
GFR, EST NON AFRICAN AMERICAN: 74 mL/min/{1.73_m2} (ref >=60)
GFR, Est African American: 86 mL/min/{1.73_m2} (ref >=60)
Globulin: 5.2 g/dL (calc) — ABNORMAL HIGH (ref 1.9–3.7)
Glucose, Bld: 87 mg/dL (ref 65–99)
POTASSIUM: 4.3 mmol/L (ref 3.5–5.3)
Sodium: 137 mmol/L (ref 135–146)
Total Protein: 8.5 g/dL — ABNORMAL HIGH (ref 6.1–8.1)

## 2017-10-28 LAB — CBC WITH DIFFERENTIAL/PLATELET
BASOS PCT: 1.1 %
Basophils Absolute: 59 cells/uL (ref 0–200)
Eosinophils Absolute: 200 cells/uL (ref 15–500)
Eosinophils Relative: 3.7 %
HCT: 35.1 % — ABNORMAL LOW (ref 38.5–50.0)
HEMOGLOBIN: 12.1 g/dL — AB (ref 13.2–17.1)
Lymphs Abs: 2176 cells/uL (ref 850–3900)
MCH: 30.3 pg (ref 27.0–33.0)
MCHC: 34.5 g/dL (ref 32.0–36.0)
MCV: 88 fL (ref 80.0–100.0)
MONOS PCT: 8.7 %
MPV: 8.7 fL (ref 7.5–12.5)
NEUTROS ABS: 2495 {cells}/uL (ref 1500–7800)
Neutrophils Relative %: 46.2 %
Platelets: 479 10*3/uL — ABNORMAL HIGH (ref 140–400)
RBC: 3.99 10*6/uL — AB (ref 4.20–5.80)
RDW: 12.3 % (ref 11.0–15.0)
Total Lymphocyte: 40.3 %
WBC mixed population: 470 cells/uL (ref 200–950)
WBC: 5.4 10*3/uL (ref 3.8–10.8)

## 2017-10-28 LAB — C-REACTIVE PROTEIN: CRP: 27.1 mg/L — AB (ref <8.0)

## 2017-10-28 LAB — SEDIMENTATION RATE

## 2017-10-29 ENCOUNTER — Ambulatory Visit (INDEPENDENT_AMBULATORY_CARE_PROVIDER_SITE_OTHER): Payer: Medicaid Other | Admitting: Orthopedic Surgery

## 2017-10-29 ENCOUNTER — Other Ambulatory Visit (INDEPENDENT_AMBULATORY_CARE_PROVIDER_SITE_OTHER): Payer: Self-pay | Admitting: Orthopedic Surgery

## 2017-10-29 ENCOUNTER — Encounter (INDEPENDENT_AMBULATORY_CARE_PROVIDER_SITE_OTHER): Payer: Self-pay | Admitting: Orthopedic Surgery

## 2017-10-29 VITALS — Ht 73.0 in | Wt 200.0 lb

## 2017-10-29 DIAGNOSIS — M7502 Adhesive capsulitis of left shoulder: Secondary | ICD-10-CM

## 2017-10-29 DIAGNOSIS — M009 Pyogenic arthritis, unspecified: Secondary | ICD-10-CM

## 2017-10-29 MED ORDER — OXYCODONE-ACETAMINOPHEN 5-325 MG PO TABS: 1.0000 | ORAL_TABLET | Freq: Three times a day (TID) | ORAL | 0 refills | Status: DC | PRN

## 2017-10-29 MED FILL — OXYCODONE-ACETAMINOPHEN 5-3: 5-325 | 7 days supply | Qty: 20 | Fill #0

## 2017-10-29 NOTE — Progress Notes (Signed)
Office Visit Note   Patient: Jonathan Meadows           Date of Birth: August 19, 1970           MRN: [id number] Visit Date: 10/29/2017              Requested by: No referring provider defined for this encounter. PCP: Patient, No Pcp Per  Chief Complaint  Patient presents with  . Left Shoulder - Routine Post Op    10/11/17 left shoulder scope       HPI: Patient is a 47 year old gentleman who presents in follow-up for left shoulder arthroscopy.  Patient also has chronic lymphedema of the right lower extremity he states is been with lymphedema clinic in [facility] and has used a lymphedema pump in the past.  Patient states he still has stiffness and soreness in the left shoulder.  All cultures were negative for infection in the left shoulder.  Assessment & Plan: Visit Diagnoses:  1. Adhesive capsulitis of left shoulder     Plan: Patient will continue with outpatient physical therapy for range of motion of the shoulder discussed the importance of working on the range of motion so he does not develop recurrent adhesive capsulitis.  Patient was given a prescription for Percocet for pain.  Recommended that he wear his compression stockings for the right leg.  Follow-Up Instructions: Return in about 1 month (around 11/26/2017).   Ortho Exam  Patient is alert, oriented, no adenopathy, well-dressed, normal affect, normal respiratory effort. Examination patient has lymphedema in the right lower extremity but no ulcers this is stable for patient's chronic conditions there is no cellulitis.  Examination of the left shoulder there is no swelling no cellulitis no drainage.  The portals are clean and dry we will harvest the sutures.  Patient does have decreased range of motion and the importance of working with his therapy was discussed.  Imaging: No results found. No images are attached to the encounter.  Labs: Lab Results  Component Value Date   HGBA1C 5.2 10/11/2017   ESRSEDRATE >130 (H)  10/27/2017   ESRSEDRATE 125 (H) 10/13/2017   ESRSEDRATE 10 10/09/2012   CRP 27.1 (H) 10/27/2017   CRP 13.0 (H) 10/13/2017   REPTSTATUS 10/16/2017 FINAL 10/11/2017   GRAMSTAIN  10/11/2017    FEW WBC PRESENT,BOTH PMN AND MONONUCLEAR NO ORGANISMS SEEN    CULT  10/11/2017    No growth aerobically or anaerobically. Performed at [facility], [place] [address], [place], [place] [address]     Lab Results  Component Value Date/Time   HGBA1C 5.2 10/11/2017 03:07 AM    Body mass index is 26.39 kg/m.  Orders:  No orders of the defined types were placed in this encounter.  No orders of the defined types were placed in this encounter.    Procedures: No procedures performed  Clinical Data: No additional findings.  ROS:  All other systems negative, except as noted in the HPI. Review of Systems  Objective: Vital Signs: Ht 6\' 1"  (1.854 m)   Wt 200 lb (90.7 kg)   BMI 26.39 kg/m   Specialty Comments:  No specialty comments available.  PMFS History: Patient Active Problem List   Diagnosis Date Noted  . Septic joint of left shoulder region ([place]) 10/27/2017  . Adhesive capsulitis of left shoulder   . Impingement syndrome of left shoulder   . Effusion of left shoulder joint 10/10/2017  . Chest pain 10/08/2017  . Cellulitis 10/08/2017  . Sepsis  with cutaneous manifestations ([place]) 07/02/2017  . GAD (generalized anxiety disorder) 06/07/2017  . Financial difficulties 06/07/2017  . Lymphedema 06/07/2017  . Gastric outlet obstruction 10/31/2016  . Chronic pain associated with significant psychosocial dysfunction 06/04/2016  . Schizophrenia, chronic condition with acute exacerbation ([place]) 09/11/2015  . Hypokalemia 01/15/2015  . Hypertension 12/03/2014  . Polysubstance abuse ([place]) 12/03/2014  . Schizoaffective disorder, bipolar type ([place]) 12/03/2014  . Back pain 08/11/2013  . Cellulitis and abscess of finger 10/10/2012  . Swollen leg 10/10/2012  . Anemia 10/10/2012    Past Medical History:  Diagnosis Date  . Anemia   . Cellulitis   . Homelessness   . Lymphedema of leg   . Paranoid schizophrenia ([place])   . Septic joint of left shoulder region [facility]) 10/27/2017    Family History  Problem Relation Age of Onset  . CAD Unknown        Neg HX  . Hypertension Unknown        Neg Hx  . Diabetes Unknown        Neg Hx  . Cancer Unknown        Neg Hx    Past Surgical History:  Procedure Laterality Date  . SHOULDER ARTHROSCOPY Left 10/11/2017   Procedure: ARTHROSCOPY SHOULDER, LEFT;  Surgeon: Newt Minion, MD;  Location: [place];  Service: Orthopedics;  Laterality: Left;   Social History   Occupational History  . Not on file  Tobacco Use  . Smoking status: Never Smoker  . Smokeless tobacco: Never Used  Substance and Sexual Activity  . Alcohol use: No  . Drug use: No  . Sexual activity: Not on file

## 2017-10-30 ENCOUNTER — Telehealth (INDEPENDENT_AMBULATORY_CARE_PROVIDER_SITE_OTHER): Payer: Self-pay

## 2017-10-30 NOTE — Telephone Encounter (Signed)
-----   Message from Pamella Pert, [place] sent at 10/29/2017 12:37 PM EDT ----- Regarding: call Call pt to advise that we need to do another MRI scan for operative shoulder.

## 2017-10-30 NOTE — Telephone Encounter (Signed)
Called pt to advise that per Dr. Sharol Given due to his recent lab results we are going to sch for another MRI of his shoulder and to expect a call in the next few days to get this set up and then follow up with Dr. Sharol Given after the study to discuss results.

## 2017-11-03 ENCOUNTER — Encounter: Payer: Self-pay | Admitting: Physical Therapy

## 2017-11-03 ENCOUNTER — Ambulatory Visit: Payer: Medicaid Other | Attending: Internal Medicine | Admitting: Physical Therapy

## 2017-11-03 ENCOUNTER — Telehealth (INDEPENDENT_AMBULATORY_CARE_PROVIDER_SITE_OTHER): Payer: Self-pay

## 2017-11-03 DIAGNOSIS — M6281 Muscle weakness (generalized): Secondary | ICD-10-CM

## 2017-11-03 DIAGNOSIS — R252 Cramp and spasm: Secondary | ICD-10-CM

## 2017-11-03 DIAGNOSIS — R293 Abnormal posture: Secondary | ICD-10-CM | POA: Diagnosis present

## 2017-11-03 DIAGNOSIS — M25512 Pain in left shoulder: Secondary | ICD-10-CM

## 2017-11-03 DIAGNOSIS — M25612 Stiffness of left shoulder, not elsewhere classified: Secondary | ICD-10-CM

## 2017-11-03 NOTE — Patient Instructions (Addendum)
SHOULDER: External Rotation - Supine (Cane)   Hold cane with both hands. Rotate arm away from body. Keep elbow on floor and next to body. 15___ reps per set, __2_ sets per day, _7__ days per week Add towel to keep elbow at side.  Copyright  VHI. All rights reserved.  Cane Horizontal - Supine   With straight arms holding cane above shoulders, bring cane out to right, center, out to left, and back to above head. Repeat ___ times. Do ___ times per day.  Copyright  VHI. All rights reserved.  Cane Exercise: Flexion   Lie on back, holding cane above chest. Keeping arms as straight as possible, lower cane toward floor beyond head. Hold _3___ seconds. Repeat _15 x2___ times. Do __7__ sessions per day.  http://[email or web address].exer.[email or web address]   Copyright  VHI. All rights reserved.   Voncille Lo, PT Certified Exercise Expert for the Aging Adult  11/03/17 11:03 AM Phone: [phone] Fax: [phone]

## 2017-11-03 NOTE — Telephone Encounter (Signed)
[facility] with [facility] called wanting to know if patient should hold off on PT due to having a possible infection in left shoulder and a lot of pain.  Talked with Autumn, Dr. Jess Barters assistant and she stated that patient should hold off on PT at this time.

## 2017-11-03 NOTE — Therapy (Addendum)
[place], [place], [address] Phone: [phone]   Fax:  [phone]  Physical Therapy Treatment/Discharge Note  Patient Details  Name: Jonathan Meadows MRN: [id number] Date of Birth: Oct 29, 1970 Referring Provider: Sharol Given,   MD   Encounter Date: 11/03/2017  PT End of Session - 11/03/17 1051    Visit Number  2    Number of Visits  4    Date for PT Re-Evaluation  12/08/17 (3 visits during initial authorization  until 11-17-17   and then and additional  2 x /week for 4    Authorization Type  MCD- awaiting initial authorization     PT Start Time  1056 only 2 unit RX, talked withMD during visit    PT Stop Time  1140    PT Time Calculation (min)  44 min    Activity Tolerance  Patient limited by pain    Behavior During Therapy  [facility] for tasks assessed/performed       Past Medical History:  Diagnosis Date  . Anemia   . Cellulitis   . Homelessness   . Lymphedema of leg   . Paranoid schizophrenia ([place])   . Septic joint of left shoulder region [facility]) 10/27/2017    Past Surgical History:  Procedure Laterality Date  . SHOULDER ARTHROSCOPY Left 10/11/2017   Procedure: ARTHROSCOPY SHOULDER, LEFT;  Surgeon: Newt Minion, MD;  Location: [place];  Service: Orthopedics;  Laterality: Left;    There were no vitals filed for this visit.  Subjective Assessment - 11/03/17 1057    Subjective  Dr Sharol Given was trying to call me.  I think they said I still have infection.  My shoulder is feeling a little better    Pertinent History  schizophrenia, septic shoulder, chronic right LE edema    Diagnostic tests  x ray    Patient Stated Goals  I want to be able to put my clothes on and bathe myself normallly, cant sleep on my left side    Currently in Pain?  Yes    Pain Score  7     Pain Location  Shoulder    Pain Orientation  Left    Pain Descriptors / Indicators  Throbbing;Shooting    Pain Type  Surgical pain    Pain Onset  1 to 4 weeks  ago    Pain Frequency  Constant                       [facility] Adult PT Treatment/Exercise - 11/03/17 1104      Self-Care   Self-Care  Other Self-Care Comments    Other Self-Care Comments   use of towel under arm for better sleep and positioning.       Shoulder Exercises: Supine   Other Supine Exercises  supine bench press cane   Pt with 7/10 pain.    Other Supine Exercises  supine cane exercises flexion, and ER to left x 15 each.   increasing pain with movement      Shoulder Exercises: Seated   Other Seated Exercises  elbow flex/ ext on tablel 10 x ball squeeze x 10  shoulder flex on table with towel x 10      Shoulder Exercises: Standing   Other Standing Exercises  pendulum vertical and horizontal and circles x 10 each      Manual Therapy   Manual Therapy  Soft tissue mobilization;Passive ROM    Soft tissue mobilization  scapular motion / anterior , posteriior    Passive ROM  flex, abd, ER to available ROM             PT Education - 11/03/17 1123    Education provided  Yes    Education Details  Pt given supine cane exercise for mobility of shoulder and education on positioning for better sleep    Person(s) Educated  Patient    Methods  Explanation;Demonstration;Tactile cues;Verbal cues;Handout    Comprehension  Verbalized understanding;Returned demonstration       PT Short Term Goals - 11/03/17 1139      PT SHORT TERM GOAL #1   Title  Pt will be independent with initial HEP    Baseline  Pt has iniital HEP    Time  3    Period  Weeks    Status  On-going      PT SHORT TERM GOAL #2   Title  Pt will increase initial AROM of shoulder to at least 70 degrees flexion to increase ease of donning clothing    Baseline  flexion of left arm no greater than 20 degrees and  7/10 to 9/10  pain today. limited AROM,  Doing AAROM exercises    Time  3    Period  Weeks    Status  On-going      PT SHORT TERM GOAL #3   Title  Pt will be able to sleep for greater  than 4 hours of sleep uninterrupted applying pain management strategies    Baseline  Pt unable to sleep greater than 2 hours at night without pain 10/10 waking every 2 hours    Time  3    Period  Weeks    Status  On-going        PT Long Term Goals - 10/27/17 1309      PT LONG TERM GOAL #1   Title  "Pt will be independent with advanced HEP.     Time  4    Period  Weeks    Status  New    Target Date  12/08/17      PT LONG TERM GOAL #2   Title  Left shoulder IR and ER will return to at least 45 degrees actively  for 50 degrees with greater ease in dressing and grooming    Baseline  Pt unable to dress without major compensations    Time  4    Period  Weeks    Status  New    Target Date  12/08/17      PT LONG TERM GOAL #3   Title  Be able to lift arm to 100 degrees with AROM and lift light items on shelf to perform household chores    Baseline  Pt unable to lift arm past 20 degrees flexion  10/10 pain    Time  4    Period  Weeks    Status  New    Target Date  12/08/17      PT LONG TERM GOAL #4   Title  Pt will be able to sleep on left shoulder at night without waking due to pain    Time  4    Period  Weeks    Status  New    Target Date  12/08/17            Plan - 11/03/17 1124    Clinical Impression Statement  Pt enters clinic with 7/10 pain and increased movement from evaluaton/  Pain increases from 7/10 to 9/10.  Pt given supine cane exercises and able to do AAROM with right UE assisting movement of left UE.  Pt is not able to flex or abd left UE in sitting. Pt complains of severe pain.  Pt mentions that Dr. Jess Barters office is trying to call to schedule an MRI to check on status of infection/ surgery.  Dr. Sharol Given called and assistant Autumn stated Jonathan Meadows is supposed to get MRI and that PT can be on hold until Jonathan Meadows status clarified.      Rehab Potential  Good    PT Frequency  2x / week    PT Duration  4 weeks    PT Treatment/Interventions  Dry needling;Passive  range of motion;ADLs/Self Care Home Management;Cryotherapy;Electrical Stimulation;Iontophoresis 14m/ml Dexamethasone;Moist Heat;Ultrasound;Therapeutic exercise;Therapeutic activities;Neuromuscular re-education;Patient/family education;Manual techniques    PT Next Visit Plan  manual and progress exercises as tolerated    PT Home Exercise Plan  pendulum, Active assist flexion. elbow flex/ext/ supine press up with cane/ supine cane exercises    Consulted and Agree with Plan of Care  Patient       Patient will benefit from skilled therapeutic intervention in order to improve the following deficits and impairments:  Postural dysfunction, Improper body mechanics, Pain, Impaired UE functional use, Increased fascial restricitons, Decreased strength, Increased muscle spasms  Visit Diagnosis: Acute pain of left shoulder  Stiffness of left shoulder, not elsewhere classified  Cramp and spasm  Abnormal posture  Muscle weakness (generalized)     Problem List Patient Active Problem List   Diagnosis Date Noted  . Septic joint of left shoulder region ([place] 10/27/2017  . Adhesive capsulitis of left shoulder   . Impingement syndrome of left shoulder   . Effusion of left shoulder joint 10/10/2017  . Chest pain 10/08/2017  . Cellulitis 10/08/2017  . Sepsis with cutaneous manifestations ([place] 07/02/2017  . GAD (generalized anxiety disorder) 06/07/2017  . Financial difficulties 06/07/2017  . Lymphedema 06/07/2017  . Gastric outlet obstruction 10/31/2016  . Chronic pain associated with significant psychosocial dysfunction 06/04/2016  . Schizophrenia, chronic condition with acute exacerbation ([place] 09/11/2015  . Hypokalemia 01/15/2015  . Hypertension 12/03/2014  . Polysubstance abuse ([place] 12/03/2014  . Schizoaffective disorder, bipolar type ([place] 12/03/2014  . Back pain 08/11/2013  . Cellulitis and abscess of finger 10/10/2012  . Swollen leg 10/10/2012  . Anemia 10/10/2012  LVoncille Lo  PT Certified Exercise Expert for the Aging Adult  11/03/17 11:47 AM Phone: [phone]: 3[facility] [address]Phone: [phone]  Fax:  [phone] Name: Jonathan ReveloMRN: [id number] of Birth: 9Aug 18, 1972 PHYSICAL THERAPY DISCHARGE SUMMARY  Visits from Start of Care: 2  Current functional level related to goals / functional outcomes: Unknown.  Pt returned to MD for further debridement of pyogenic arthritis and additioanl shld arthroscopy   Remaining deficits: unknown   Education / Equipment: unknonw Plan:                                                    Patient goals were not met. Patient is being discharged due to a change in medical status.  ?????    Pt was placed on hold and had further medical work up and procedures.  Pt will need  new RX to begin PT again.   Voncille Lo, PT Certified Exercise Expert for the Aging Adult  11/26/17 11:25 AM Phone: [phone] Fax: [phone]

## 2017-11-04 ENCOUNTER — Ambulatory Visit (INDEPENDENT_AMBULATORY_CARE_PROVIDER_SITE_OTHER): Payer: Medicaid Other | Admitting: Infectious Disease

## 2017-11-04 ENCOUNTER — Telehealth: Payer: Self-pay | Admitting: Infectious Disease

## 2017-11-04 ENCOUNTER — Encounter: Payer: Self-pay | Admitting: Infectious Disease

## 2017-11-04 ENCOUNTER — Telehealth (INDEPENDENT_AMBULATORY_CARE_PROVIDER_SITE_OTHER): Payer: Self-pay | Admitting: Orthopedic Surgery

## 2017-11-04 VITALS — BP 133/77 | HR 97 | Temp 98.7°F | Wt 201.0 lb

## 2017-11-04 DIAGNOSIS — F209 Schizophrenia, unspecified: Secondary | ICD-10-CM | POA: Diagnosis not present

## 2017-11-04 DIAGNOSIS — M009 Pyogenic arthritis, unspecified: Secondary | ICD-10-CM

## 2017-11-04 DIAGNOSIS — F250 Schizoaffective disorder, bipolar type: Secondary | ICD-10-CM | POA: Diagnosis not present

## 2017-11-04 DIAGNOSIS — M7502 Adhesive capsulitis of left shoulder: Secondary | ICD-10-CM

## 2017-11-04 LAB — CBC WITH DIFFERENTIAL/PLATELET
Basophils Absolute: 0 10*3/uL (ref 0.0–0.1)
Basophils Relative: 0 %
EOS ABS: 0.1 10*3/uL (ref 0.0–0.7)
Eosinophils Relative: 2 %
HEMATOCRIT: 37 % — AB (ref 39.0–52.0)
HEMOGLOBIN: 12.1 g/dL — AB (ref 13.0–17.0)
LYMPHS ABS: 1.7 10*3/uL (ref 0.7–4.0)
LYMPHS PCT: 33 %
MCH: 30.6 pg (ref 26.0–34.0)
MCHC: 32.7 g/dL (ref 30.0–36.0)
MCV: 93.4 fL (ref 78.0–100.0)
MONOS PCT: 8 %
Monocytes Absolute: 0.4 10*3/uL (ref 0.1–1.0)
NEUTROS PCT: 57 %
Neutro Abs: 2.9 10*3/uL (ref 1.7–7.7)
Platelets: 346 10*3/uL (ref 150–400)
RBC: 3.96 MIL/uL — ABNORMAL LOW (ref 4.22–5.81)
RDW: 13.2 % (ref 11.5–15.5)
WBC: 5.2 10*3/uL (ref 4.0–10.5)

## 2017-11-04 MED ORDER — LINEZOLID 600 MG PO TABS: 600.0000 mg | ORAL_TABLET | Freq: Two times a day (BID) | ORAL | 0 refills | Status: AC

## 2017-11-04 NOTE — Telephone Encounter (Signed)
Can you advise on refill RX since Dr. Sharol Given is out of the office?  Gabriel Cirri can you check if MRI (ins) was approved?

## 2017-11-04 NOTE — Telephone Encounter (Signed)
noted 

## 2017-11-04 NOTE — Telephone Encounter (Signed)
ASK dUDA ABOUT PERCOCET NO abx IN CHART

## 2017-11-04 NOTE — Progress Notes (Signed)
Subjective:   Chief complaint: Shoulder pain   Patient ID: Jonathan Meadows, male    DOB: Mar 11, 1971, 47 y.o.   MRN: [id number]  HPI  Jonathan Meadows is a 47 y.o. male differentiated schizophrenia and chronic right lower extremity lymphedema of uncertain etiology presents with severe left-sided shoulder pain.  He was found to have a septic shoulder with her and 66,000 white blood cells seen on fluid analysis with 94% neutrophils.  Unfortunately had been Meadows vancomycin in the ER to treat "cellulitis".  Gram stain showed gram-positive cocci in clusters but the cultures have been without growth.  He is status post surgery by Jonathan Meadows who performed   ARTHROSCOPY SHOULDER, LEFT with subacromial decompression with extensive debridement of the glenohumeral joint with debridement of SLAP lesion debridement of biceps tendon and debridement of adhesions to the subscapularis.    Intraoperative cultures having been obtained as well but these are also not revealing a specific organism either.   Jonathan Meadows intraoperative notes indicate the patient has significant amount of impingement in the shoulder.  He did not find purulence in the subacromial space.  The patient lives by himself and does not have a family member who could help him with administration of IV antibiotics.  And that his left arm is in a sling self administration seemed impractial.  Therefore we have been treating him with a highly bioavailable antibiotic that should be active against MRSA MSSA and streptococcal species namely Zyvox 600 mg twice daily.  We obtained a 4-week supply for him and he will be due to run out of shortly.  Since he had surgery has had some improvement in his pain but still has had a hard time.  He is having a great deal of difficulty with physical therapy and this is now been stopped for now.  We checked his inflammatory markers markers and they were quite elevated.  He has been seen by Jonathan Meadows as well ordered an MRI  last week but this is not yet been done.  There is a suffers from pain and is asking for opiate prescriptions at this visit.  I think getting the MRI is critical.  I will check routine safety labs to see if it is safe to continue on Zyvox but if his shoulder looks to be worse on MRI I would actually want him to stop antibiotics we will get intraoperative cultures that are informative.  Past Medical History:  Diagnosis Date  . Anemia   . Cellulitis   . Homelessness   . Lymphedema of leg   . Paranoid schizophrenia ([place])   . Septic joint of left shoulder region [facility]) 10/27/2017    Past Surgical History:  Procedure Laterality Date  . SHOULDER ARTHROSCOPY Left 10/11/2017   Procedure: ARTHROSCOPY SHOULDER, LEFT;  Surgeon: Newt Minion, MD;  Location: [place];  Service: Orthopedics;  Laterality: Left;    Family History  Problem Relation Age of Onset  . CAD Unknown        Neg HX  . Hypertension Unknown        Neg Hx  . Diabetes Unknown        Neg Hx  . Cancer Unknown        Neg Hx      Social History   Socioeconomic History  . Marital status: Single    Spouse name: Not on file  . Number of children: Not on file  . Years of education: Not on file  . Highest  education level: Not on file  Occupational History  . Not on file  Social Needs  . Financial resource strain: Not on file  . Food insecurity:    Worry: Not on file    Inability: Not on file  . Transportation needs:    Medical: Not on file    Non-medical: Not on file  Tobacco Use  . Smoking status: Never Smoker  . Smokeless tobacco: Never Used  Substance and Sexual Activity  . Alcohol use: No  . Drug use: No  . Sexual activity: Not on file  Lifestyle  . Physical activity:    Days per week: Not on file    Minutes per session: Not on file  . Stress: Not on file  Relationships  . Social connections:    Talks on phone: Not on file    Gets together: Not on file    Attends religious service: Not on file     Active member of club or organization: Not on file    Attends meetings of clubs or organizations: Not on file    Relationship status: Not on file  Other Topics Concern  . Not on file  Social History Narrative  . Not on file    Allergies  Allergen Reactions  . Pork-Derived Products Nausea And Vomiting     Current Outpatient Medications:  .  benzonatate (TESSALON) 100 MG capsule, Take 1 capsule (100 mg total) by mouth 3 (three) times daily as needed for cough., Disp: 21 capsule, Rfl: 0 .  furosemide (LASIX) 20 MG tablet, Take 20 mg by mouth daily., Disp: , Rfl:  .  linezolid (ZYVOX) 600 MG tablet, Take 1 tablet (600 mg total) by mouth 2 (two) times daily for 27 days., Disp: 54 tablet, Rfl: 0 .  loperamide (IMODIUM) 2 MG capsule, Take 1 capsule (2 mg total) by mouth 4 (four) times daily as needed for diarrhea or loose stools., Disp: 12 capsule, Rfl: 0 .  oxyCODONE-acetaminophen (PERCOCET/ROXICET) 5-325 MG tablet, Take 1 tablet by mouth every 8 (eight) hours as needed for severe pain., Disp: 20 tablet, Rfl: 0 .  traMADol (ULTRAM) 50 MG tablet, Take 1 tablet (50 mg total) by mouth every 12 (twelve) hours as needed. (Patient taking differently: Take 50 mg by mouth every 12 (twelve) hours as needed for moderate pain. ), Disp: 10 tablet, Rfl: 0     Review of Systems  Constitutional: Negative for activity change, appetite change, chills, diaphoresis, fatigue, fever and unexpected weight change.  HENT: Negative for congestion, rhinorrhea, sinus pressure, sneezing, sore throat and trouble swallowing.   Eyes: Negative for photophobia and visual disturbance.  Respiratory: Negative for cough, chest tightness, shortness of breath, wheezing and stridor.   Cardiovascular: Negative for chest pain, palpitations and leg swelling.  Gastrointestinal: Negative for abdominal distention, abdominal pain, anal bleeding, blood in stool, constipation, diarrhea, nausea and vomiting.  Genitourinary: Negative for  difficulty urinating, dysuria, flank pain and hematuria.  Musculoskeletal: Positive for arthralgias. Negative for back pain, gait problem, joint swelling and myalgias.  Skin: Negative for color change, pallor, rash and wound.  Neurological: Negative for dizziness, tremors, weakness and light-headedness.  Hematological: Negative for adenopathy. Does not bruise/bleed easily.  Psychiatric/Behavioral: Negative for agitation, behavioral problems, confusion, decreased concentration, dysphoric mood and sleep disturbance.       Objective:   Physical Exam  Constitutional: He is oriented to person, place, and time. He appears well-developed and well-nourished.  HENT:  Head: Normocephalic and atraumatic.  Eyes: Conjunctivae and  EOM are normal.  Neck: Normal range of motion. Neck supple.  Cardiovascular: Normal rate and regular rhythm.  Pulmonary/Chest: Effort normal. No respiratory distress. He has no wheezes.  Abdominal: Soft. He exhibits no distension.  Musculoskeletal: Normal range of motion. He exhibits no edema or tenderness.  Neurological: He is alert and oriented to person, place, and time.  Skin: Skin is warm and dry. No rash noted. No erythema. No pallor.   Left shoulder: Tender  See pictures of the wounds which are healing well October 27, 2017:           Assessment & Plan:   Septic left shoulder + impingement:: I think we clearly need the MRI to help [place] understand what is going on with his shoulder and if indeed there is now worsening infection that requires surgical intervention.  If this is the case we will stop his antibiotics to improve the yield on intraoperative cultures that can be sent.  I in the interim I am going to recheck CBC with differential to monitor for thrombocytopenia as well as repeating his inflammatory markers and a conference of metabolic panel.  If he is not thrombocytopenic and there is still going to be significant delay in the MRI being performed I will  likely reinstitute Zyvox for the next few weeks.  If he has developed toxicity to him Zyvox I will likely change him to twice daily Bactrim.

## 2017-11-04 NOTE — Telephone Encounter (Signed)
I would like patient to pick up zyvox and continue taking for now though as mentioned in note before we may need to stop it if MRI looks worse

## 2017-11-04 NOTE — Patient Instructions (Signed)
We will get blood work today.  If your white blood cell count, platelets and hemoglobin are stable I will send in another month of zyvox to your pharmacy  If they are not I will send in a different oral antibiotic  I am worried that you need another surgery and if the MRI shows that you do then I will actually want you to stop the antibiotic but for now we wil continue you on one  If we continue antibiotics we may need you to come back for labs (not MD visit)  In 2 weeks

## 2017-11-04 NOTE — Telephone Encounter (Signed)
Still pending authorization thru [employer].

## 2017-11-04 NOTE — Telephone Encounter (Addendum)
Pt is aware that Dr.Duda is out of the office   Pt called checking on the status of MRI since he  cant complete PT due to infection in arm

## 2017-11-04 NOTE — Telephone Encounter (Signed)
Patient called asking for a refill on his hydrocodone and antibiotics. CB # [phone]

## 2017-11-04 NOTE — Telephone Encounter (Signed)
Please see messages below and advise. Patient is requesting refill on pain medication and antibiotics.  Was given #20 Oxycodone on 5/2 and Artis Delay was unable to find antibiotic in med list. Thanks.

## 2017-11-05 ENCOUNTER — Other Ambulatory Visit (INDEPENDENT_AMBULATORY_CARE_PROVIDER_SITE_OTHER): Payer: Self-pay | Admitting: Orthopedic Surgery

## 2017-11-05 ENCOUNTER — Telehealth (INDEPENDENT_AMBULATORY_CARE_PROVIDER_SITE_OTHER): Payer: Self-pay | Admitting: Orthopedic Surgery

## 2017-11-05 LAB — BASIC METABOLIC PANEL WITH GFR
BUN: 11 mg/dL (ref 7–25)
CO2: 28 mmol/L (ref 20–32)
Calcium: 9.3 mg/dL (ref 8.6–10.3)
Chloride: 101 mmol/L (ref 98–110)
Creat: 1.25 mg/dL (ref 0.60–1.35)
GFR, EST NON AFRICAN AMERICAN: 68 mL/min/{1.73_m2} (ref >=60)
GFR, Est African American: 79 mL/min/{1.73_m2} (ref >=60)
GLUCOSE: 97 mg/dL (ref 65–99)
POTASSIUM: 4.2 mmol/L (ref 3.5–5.3)
SODIUM: 139 mmol/L (ref 135–146)

## 2017-11-05 LAB — C-REACTIVE PROTEIN: CRP: 20.6 mg/L — ABNORMAL HIGH (ref <8.0)

## 2017-11-05 LAB — SEDIMENTATION RATE: Sed Rate: 121 mm/h — ABNORMAL HIGH (ref 0–15)

## 2017-11-05 MED ORDER — OXYCODONE-ACETAMINOPHEN 5-325 MG PO TABS: 1.0000 | ORAL_TABLET | Freq: Three times a day (TID) | ORAL | 0 refills | Status: DC | PRN

## 2017-11-05 MED FILL — OXYCODONE-ACETAMINOPHEN 5-3: 5-325 | 7 days supply | Qty: 20 | Fill #0

## 2017-11-05 NOTE — Telephone Encounter (Signed)
rx written, need to check on repeat mri of shoulder

## 2017-11-05 NOTE — Telephone Encounter (Signed)
MRI auth pending, discussed with patient. They will call him to sched once approved by Medicaid.

## 2017-11-05 NOTE — Telephone Encounter (Signed)
Called patient, verified identity, informed patient per Dr. Tommy Medal that a prescription for Zyvox was sent to the pharmacy and may need to stop if MRI looks worse.  Patient states Dr. Jess Barters office called him and told him not to take it anymore until after he gets his MRI.  Patient states they are in the process of scheduling the MRI.   Pricilla Riffle RN

## 2017-11-05 NOTE — Telephone Encounter (Signed)
It is ok for him to just hold. Let him hold it but not start until we know wheter he I sgoing back for surgery and in that case wait until post op cultures are taken to consider it. I think he is going to have to get admitted and have further surgery

## 2017-11-06 NOTE — Telephone Encounter (Signed)
Called patient and informed him per Dr. Tommy Medal that he can hold off on the Zyvox for now and not start it until he knows if he is going to go back to surgery.  Also let him know if that was the case he will need post op cultures. Patient verbalized understanding. Pricilla Riffle RN

## 2017-11-06 NOTE — Progress Notes (Signed)
I ordered the repeat shoulder MRI on 5 / 2 , I will check on the status of this order

## 2017-11-09 ENCOUNTER — Encounter: Payer: Self-pay | Admitting: Physical Therapy

## 2017-11-10 ENCOUNTER — Ambulatory Visit (HOSPITAL_COMMUNITY)
Admission: RE | Admit: 2017-11-10 | Discharge: 2017-11-10 | Disposition: A | Discharge status: Home / Self Care | Payer: Medicaid Other | Source: Ambulatory Visit | Attending: Orthopedic Surgery | Admitting: Orthopedic Surgery

## 2017-11-10 DIAGNOSIS — M009 Pyogenic arthritis, unspecified: Secondary | ICD-10-CM | POA: Diagnosis not present

## 2017-11-10 DIAGNOSIS — R609 Edema, unspecified: Secondary | ICD-10-CM | POA: Diagnosis not present

## 2017-11-10 DIAGNOSIS — M25412 Effusion, left shoulder: Secondary | ICD-10-CM | POA: Diagnosis not present

## 2017-11-11 NOTE — Progress Notes (Signed)
Can you have him come in thx

## 2017-11-12 ENCOUNTER — Ambulatory Visit: Payer: Medicaid Other | Admitting: Physical Therapy

## 2017-11-12 ENCOUNTER — Telehealth (INDEPENDENT_AMBULATORY_CARE_PROVIDER_SITE_OTHER): Payer: Self-pay | Admitting: Orthopedic Surgery

## 2017-11-12 ENCOUNTER — Other Ambulatory Visit (INDEPENDENT_AMBULATORY_CARE_PROVIDER_SITE_OTHER): Payer: Self-pay | Admitting: Orthopedic Surgery

## 2017-11-12 MED ORDER — OXYCODONE-ACETAMINOPHEN 5-325 MG PO TABS: 1.0000 | ORAL_TABLET | Freq: Three times a day (TID) | ORAL | 0 refills | Status: DC | PRN

## 2017-11-12 MED FILL — OXYCODONE-ACETAMINOPHEN 5-3: 5-325 | 6 days supply | Qty: 20 | Fill #0

## 2017-11-12 NOTE — Telephone Encounter (Signed)
Patient called requesting an RX refill on his Oxycodone.  He also asked if Dr. Sharol Given can increase the quantity of pills because he does not have the resources to come get his medication every week.  CB#[phone]

## 2017-11-12 NOTE — Telephone Encounter (Signed)
rx written, f/u with dean for surgical eval

## 2017-11-12 NOTE — Telephone Encounter (Signed)
Pt is s/p a left shoulder scope 10/11/17 asking for refill of percocet 5/325 last refill was 10/29/17 and 11/05/17 qty #20. He is asking for an increase in qty because he can not come in every week. He is pending repeat refill on shoulder to check for infection

## 2017-11-12 NOTE — Telephone Encounter (Signed)
appt made for next Tuesday 11/17/17 at 3:30 for septic shoulder and also advised the rx is at the front desk for pick up.

## 2017-11-13 NOTE — Progress Notes (Signed)
He should come in Monday afternoon

## 2017-11-16 ENCOUNTER — Ambulatory Visit (INDEPENDENT_AMBULATORY_CARE_PROVIDER_SITE_OTHER): Payer: Medicaid Other | Admitting: Orthopedic Surgery

## 2017-11-17 ENCOUNTER — Ambulatory Visit (INDEPENDENT_AMBULATORY_CARE_PROVIDER_SITE_OTHER): Payer: Medicaid Other | Admitting: Orthopedic Surgery

## 2017-11-17 DIAGNOSIS — M009 Pyogenic arthritis, unspecified: Secondary | ICD-10-CM

## 2017-11-18 MED FILL — OXYCODONE-ACETAMINOPHEN 5-3: 5-325 | 6 days supply | Qty: 35 | Fill #0

## 2017-11-18 NOTE — Pre-Procedure Instructions (Signed)
Pressley Tadesse  11/18/2017      [place], [place] - [facility]. [address] [place] [address] Phone: [phone] Fax: [phone]    Your procedure is scheduled on May 28  Report to [place] at [place].M.  Call this number if you have problems the morning of surgery:  [phone]   Remember:  NOTHING TO EAT OR DRINK AFTER MIDNIGHT    Take these medicines the morning of surgery with A SIP OF WATER  oxyCODONE-acetaminophen (PERCOCET/ROXICET) IF NEEDED  7 days prior to surgery STOP taking any Aspirin(unless otherwise instructed by your surgeon), Aleve, Naproxen, Ibuprofen, Motrin, Advil, Goody's, BC's, all herbal medications, fish oil, and all vitamins     Do not wear jewelry  Do not wear lotions, powders, or cologne, or deodorant.   Men may shave face and neck.  Do not bring valuables to the hospital.  [facility] is not responsible for any belongings or valuables.  Contacts, dentures or bridgework may not be worn into surgery.  Leave your suitcase in the car.  After surgery it may be brought to your room.  For patients admitted to the hospital, discharge time will be determined by your treatment team.  Patients discharged the day of surgery will not be allowed to drive home.    Special instructions:   [place]- Preparing For Surgery  Before surgery, you can play an important role. Because skin is not sterile, your skin needs to be as free of germs as possible. You can reduce the number of germs on your skin by washing with CHG (chlorahexidine gluconate) Soap before surgery.  CHG is an antiseptic cleaner which kills germs and bonds with the skin to continue killing germs even after washing.    Oral Hygiene is also important to reduce your risk of infection.  Remember - BRUSH YOUR TEETH THE MORNING OF SURGERY WITH YOUR REGULAR TOOTHPASTE  Please do not use if you have an allergy to CHG or  antibacterial soaps. If your skin becomes reddened/irritated stop using the CHG.  Do not shave (including legs and underarms) for at least 48 hours prior to first CHG shower. It is OK to shave your face.  Please follow these instructions carefully.   1. Shower the NIGHT BEFORE SURGERY and the MORNING OF SURGERY with CHG.   2. If you chose to wash your hair, wash your hair first as usual with your normal shampoo.  3. After you shampoo, rinse your hair and body thoroughly to remove the shampoo.  4. Use CHG as you would any other liquid soap. You can apply CHG directly to the skin and wash gently with a scrungie or a clean washcloth.   5. Apply the CHG Soap to your body ONLY FROM THE NECK DOWN.  Do not use on open wounds or open sores. Avoid contact with your eyes, ears, mouth and genitals (private parts). Wash Face and genitals (private parts)  with your normal soap.  6. Wash thoroughly, paying special attention to the area where your surgery will be performed.  7. Thoroughly rinse your body with warm water from the neck down.  8. DO NOT shower/wash with your normal soap after using and rinsing off the CHG Soap.  9. Pat yourself dry with a CLEAN TOWEL.  10. Wear CLEAN PAJAMAS to bed the night before surgery, wear comfortable clothes the morning of surgery  11. Place CLEAN SHEETS on your  bed the night of your first shower and DO NOT SLEEP WITH PETS.    Day of Surgery:  Do not apply any deodorants/lotions.  Please wear clean clothes to the hospital/surgery center.   Remember to brush your teeth WITH YOUR REGULAR TOOTHPASTE.    Please read over the following fact sheets that you were given.

## 2017-11-19 ENCOUNTER — Ambulatory Visit (INDEPENDENT_AMBULATORY_CARE_PROVIDER_SITE_OTHER): Payer: Medicaid Other | Admitting: Orthopedic Surgery

## 2017-11-19 ENCOUNTER — Inpatient Hospital Stay (HOSPITAL_COMMUNITY)
Admission: RE | Admit: 2017-11-19 | Discharge: 2017-11-19 | Disposition: A | Discharge status: Home / Self Care | Payer: Self-pay | Source: Ambulatory Visit

## 2017-11-19 ENCOUNTER — Encounter (HOSPITAL_COMMUNITY): Payer: Self-pay

## 2017-11-19 NOTE — Progress Notes (Signed)
Left message for Sherri with Dr. Marlou Sa that patient was a no show for PAT appointment.

## 2017-11-20 ENCOUNTER — Other Ambulatory Visit: Payer: Self-pay

## 2017-11-20 ENCOUNTER — Other Ambulatory Visit (INDEPENDENT_AMBULATORY_CARE_PROVIDER_SITE_OTHER): Payer: Self-pay | Admitting: Orthopedic Surgery

## 2017-11-20 ENCOUNTER — Encounter (HOSPITAL_COMMUNITY): Payer: Self-pay | Admitting: *Deleted

## 2017-11-20 DIAGNOSIS — M009 Pyogenic arthritis, unspecified: Secondary | ICD-10-CM

## 2017-11-21 ENCOUNTER — Encounter (INDEPENDENT_AMBULATORY_CARE_PROVIDER_SITE_OTHER): Payer: Self-pay | Admitting: Orthopedic Surgery

## 2017-11-21 NOTE — Progress Notes (Signed)
Post-Op Visit Note   Patient: Jonathan Meadows           Date of Birth: [birth year]           MRN: [id number] Visit Date: 11/17/2017 PCP: Patient, No Pcp Per   Assessment & Plan:  Chief Complaint:  Chief Complaint  Patient presents with  . Left Shoulder - Follow-up, Pain   Visit Diagnoses:  1. Pyogenic arthritis of left shoulder region, due to unspecified organism [facility])     Plan: Tolbert is a patient with left shoulder pain.  Had left shoulder arthroscopy 10/11/2017 for septic shoulder.  Nothing really grew out of cultures.  He did have positive Gram stain.  He has been under treatment with infectious disease who have stopped his antibiotics in anticipation of further surgery to try to obtain an organism.  CBC differential on 11/04/2017 showed white count of 9.2 sed rate 121 and CRP of 20.6.  He is requesting stronger pain medicine today.  He is not really having any fevers or chills.  He is here for surgical evaluation for further surgery on the left shoulder following MRI scan after the previous surgery.  CAT scan is reviewed and it does show recurrence of fluid collection in the shoulder and possible new abscess underneath the acromion and AC joint.  On exam he has pretty reasonable range of motion which is not excessively tender.  The shoulder itself is not warm.  Plan is repeat shoulder debridement consisting of arthroscopic washout of the joint with placement of antibiotic beads into the joint and open distal clavicle excision and shoulder irrigation and debridement.  I did tell Rakin that his shoulder will be significantly more painful after this surgery.  Patient understands the risks and benefits and wants wishes to proceed.  We will continue holding off on his antibiotics until surgery next week in hopes of being able to narrow down the long-term treatment with correct organism identification.  Follow-Up Instructions: No follow-ups on file.   Orders:  No orders of the defined types  were placed in this encounter.  No orders of the defined types were placed in this encounter.   Imaging: No results found.  PMFS History: Patient Active Problem List   Diagnosis Date Noted  . Septic joint of left shoulder region ([place]) 10/27/2017  . Adhesive capsulitis of left shoulder   . Impingement syndrome of left shoulder   . Effusion of left shoulder joint 10/10/2017  . Chest pain 10/08/2017  . Cellulitis 10/08/2017  . Sepsis with cutaneous manifestations ([place]) 07/02/2017  . GAD (generalized anxiety disorder) 06/07/2017  . Financial difficulties 06/07/2017  . Lymphedema 06/07/2017  . Gastric outlet obstruction 10/31/2016  . Chronic pain associated with significant psychosocial dysfunction 06/04/2016  . Schizophrenia, chronic condition with acute exacerbation ([place]) 09/11/2015  . Hypokalemia 01/15/2015  . Hypertension 12/03/2014  . Polysubstance abuse ([place]) 12/03/2014  . Schizoaffective disorder, bipolar type ([place]) 12/03/2014  . Back pain 08/11/2013  . Cellulitis and abscess of finger 10/10/2012  . Swollen leg 10/10/2012  . Anemia 10/10/2012   Past Medical History:  Diagnosis Date  . Anemia   . Cellulitis   . Homelessness   . Lymphedema of leg   . Paranoid schizophrenia ([place])    pt denies  . Septic joint of left shoulder region [facility]) 10/27/2017    Family History  Problem Relation Age of Onset  . CAD Unknown        Neg HX  . Hypertension Unknown  Neg Hx  . Diabetes Unknown        Neg Hx  . Cancer Unknown        Neg Hx  . Lupus Mother     Past Surgical History:  Procedure Laterality Date  . SHOULDER ARTHROSCOPY Left 10/11/2017   Procedure: ARTHROSCOPY SHOULDER, LEFT;  Surgeon: Newt Minion, MD;  Location: [facility];  Service: Orthopedics;  Laterality: Left;   Social History   Occupational History  . Not on file  Tobacco Use  . Smoking status: Never Smoker  . Smokeless tobacco: Never Used  Substance and Sexual Activity  . Alcohol use: No  . Drug  use: No  . Sexual activity: Not on file

## 2017-11-23 NOTE — Anesthesia Preprocedure Evaluation (Signed)
Anesthesia Evaluation  Patient identified by MRN, date of birth, ID band Patient awake    Reviewed: Allergy & Precautions, NPO status , Patient's Chart, lab work & pertinent test results  Airway Mallampati: I  TM Distance: >3 FB Neck ROM: Full    Dental no notable dental hx. (+) Teeth Intact, Dental Advisory Given   Pulmonary neg pulmonary ROS,    Pulmonary exam normal breath sounds clear to auscultation       Cardiovascular Exercise Tolerance: Good hypertension, Pt. on medications Normal cardiovascular exam Rhythm:Regular Rate:Normal  Echo 10/09/17 - Left ventricle: The cavity size was mildly dilated. Wall   thickness was normal. Systolic function was normal. The estimated   ejection fraction was in the range of 55% to 60%. Wall motion was   Normal;    Neuro/Psych negative neurological ROS     GI/Hepatic negative GI ROS,   Endo/Other  negative endocrine ROS  Renal/GU negative Renal ROS     Musculoskeletal   Abdominal   Peds negative pediatric ROS (+)  Hematology  (+) anemia ,   Anesthesia Other Findings   Reproductive/Obstetrics                             Lab Results  Component Value Date   WBC 5.2 11/04/2017   HGB 12.1 (L) 11/04/2017   HCT 37.0 (L) 11/04/2017   MCV 93.4 11/04/2017   PLT 346 11/04/2017    Anesthesia Physical Anesthesia Plan  ASA: II  Anesthesia Plan: General and Regional   Post-op Pain Management: GA combined w/ Regional for post-op pain   Induction: Inhalational  PONV Risk Score and Plan: 2 and Treatment may vary due to age or medical condition and Ondansetron  Airway Management Planned: Oral ETT  Additional Equipment:   Intra-op Plan:   Post-operative Plan: Extubation in OR  Informed Consent: I have reviewed the patients History and Physical, chart, labs and discussed the procedure including the risks, benefits and alternatives for the proposed  anesthesia with the patient or authorized representative who has indicated his/her understanding and acceptance.   Dental advisory given  Plan Discussed with: CRNA  Anesthesia Plan Comments:       Anesthesia Quick Evaluation

## 2017-11-24 ENCOUNTER — Other Ambulatory Visit: Payer: Self-pay

## 2017-11-24 ENCOUNTER — Inpatient Hospital Stay (HOSPITAL_COMMUNITY)
Admission: RE | Admit: 2017-11-24 | Discharge: 2017-11-26 | DRG: 501 | Disposition: A | Discharge status: Home / Self Care | Payer: Medicaid Other | Attending: Orthopedic Surgery | Admitting: Orthopedic Surgery

## 2017-11-24 ENCOUNTER — Inpatient Hospital Stay (HOSPITAL_COMMUNITY): Payer: Medicaid Other | Admitting: Anesthesiology

## 2017-11-24 ENCOUNTER — Encounter (HOSPITAL_COMMUNITY): Payer: Self-pay | Admitting: *Deleted

## 2017-11-24 ENCOUNTER — Encounter (HOSPITAL_COMMUNITY)
Admission: RE | Disposition: A | Discharge status: Home / Self Care | Payer: Self-pay | Source: Home / Self Care | Attending: Orthopedic Surgery

## 2017-11-24 DIAGNOSIS — G4700 Insomnia, unspecified: Secondary | ICD-10-CM | POA: Diagnosis not present

## 2017-11-24 DIAGNOSIS — I10 Essential (primary) hypertension: Secondary | ICD-10-CM | POA: Diagnosis present

## 2017-11-24 DIAGNOSIS — L02414 Cutaneous abscess of left upper limb: Secondary | ICD-10-CM

## 2017-11-24 DIAGNOSIS — Z91018 Allergy to other foods: Secondary | ICD-10-CM

## 2017-11-24 DIAGNOSIS — M869 Osteomyelitis, unspecified: Secondary | ICD-10-CM

## 2017-11-24 DIAGNOSIS — M60812 Other myositis, left shoulder: Secondary | ICD-10-CM | POA: Diagnosis not present

## 2017-11-24 DIAGNOSIS — Z79899 Other long term (current) drug therapy: Secondary | ICD-10-CM | POA: Diagnosis not present

## 2017-11-24 DIAGNOSIS — F209 Schizophrenia, unspecified: Secondary | ICD-10-CM | POA: Diagnosis not present

## 2017-11-24 DIAGNOSIS — I890 Lymphedema, not elsewhere classified: Secondary | ICD-10-CM | POA: Diagnosis present

## 2017-11-24 DIAGNOSIS — M009 Pyogenic arthritis, unspecified: Principal | ICD-10-CM | POA: Diagnosis present

## 2017-11-24 DIAGNOSIS — M659 Synovitis and tenosynovitis, unspecified: Secondary | ICD-10-CM | POA: Diagnosis present

## 2017-11-24 DIAGNOSIS — D649 Anemia, unspecified: Secondary | ICD-10-CM | POA: Diagnosis present

## 2017-11-24 DIAGNOSIS — Z8659 Personal history of other mental and behavioral disorders: Secondary | ICD-10-CM

## 2017-11-24 DIAGNOSIS — L0390 Cellulitis, unspecified: Secondary | ICD-10-CM | POA: Diagnosis not present

## 2017-11-24 DIAGNOSIS — M65812 Other synovitis and tenosynovitis, left shoulder: Secondary | ICD-10-CM | POA: Diagnosis not present

## 2017-11-24 DIAGNOSIS — F200 Paranoid schizophrenia: Secondary | ICD-10-CM | POA: Diagnosis present

## 2017-11-24 DIAGNOSIS — Z590 Homelessness: Secondary | ICD-10-CM | POA: Diagnosis not present

## 2017-11-24 HISTORY — PX: SHOULDER ARTHROSCOPY WITH SUBACROMIAL DECOMPRESSION, ROTATOR CUFF REPAIR AND BICEP TENDON REPAIR: SHX5687

## 2017-11-24 LAB — CBC
HEMATOCRIT: 37.5 % — AB (ref 39.0–52.0)
HEMOGLOBIN: 12.1 g/dL — AB (ref 13.0–17.0)
MCH: 30.2 pg (ref 26.0–34.0)
MCHC: 32.3 g/dL (ref 30.0–36.0)
MCV: 93.5 fL (ref 78.0–100.0)
Platelets: 287 10*3/uL (ref 150–400)
RBC: 4.01 MIL/uL — AB (ref 4.22–5.81)
RDW: 14.7 % (ref 11.5–15.5)
WBC: 5.3 10*3/uL (ref 4.0–10.5)

## 2017-11-24 LAB — SEDIMENTATION RATE: Sed Rate: 56 mm/hr — ABNORMAL HIGH (ref 0–16)

## 2017-11-24 LAB — GLUCOSE, CAPILLARY: Glucose-Capillary: 151 mg/dL — ABNORMAL HIGH (ref 65–99)

## 2017-11-24 LAB — BASIC METABOLIC PANEL
ANION GAP: 8 (ref 5–15)
BUN: 7 mg/dL (ref 6–20)
CO2: 28 mmol/L (ref 22–32)
Calcium: 9.4 mg/dL (ref 8.9–10.3)
Chloride: 105 mmol/L (ref 101–111)
Creatinine, Ser: 0.99 mg/dL (ref 0.61–1.24)
Glucose, Bld: 85 mg/dL (ref 65–99)
POTASSIUM: 4.2 mmol/L (ref 3.5–5.1)
Sodium: 141 mmol/L (ref 135–145)

## 2017-11-24 LAB — C-REACTIVE PROTEIN

## 2017-11-24 SURGERY — SHOULDER ARTHROSCOPY WITH SUBACROMIAL DECOMPRESSION, ROTATOR CUFF REPAIR AND BICEP TENDON REPAIR
Anesthesia: Regional | Site: Shoulder | Laterality: Left

## 2017-11-24 MED ORDER — MEPERIDINE HCL 50 MG/ML IJ SOLN: 6.2500 mg | INTRAMUSCULAR | Status: DC | PRN

## 2017-11-24 MED ORDER — BUPIVACAINE LIPOSOME 1.3 % IJ SUSP
20.0000 mL | Freq: Once | INTRAMUSCULAR | Status: DC
  Filled 2017-11-24: qty 20

## 2017-11-24 MED ORDER — FENTANYL CITRATE (PF) 250 MCG/5ML IJ SOLN
INTRAMUSCULAR | Status: AC
  Filled 2017-11-24: qty 5

## 2017-11-24 MED ORDER — LABETALOL HCL 5 MG/ML IV SOLN
INTRAVENOUS | Status: AC
  Filled 2017-11-24: qty 4

## 2017-11-24 MED ORDER — HYDROCODONE-ACETAMINOPHEN 7.5-325 MG PO TABS: 1.0000 | ORAL_TABLET | Freq: Once | ORAL | Status: DC | PRN

## 2017-11-24 MED ORDER — VANCOMYCIN HCL 10 G IV SOLR
1250.0000 mg | Freq: Once | INTRAVENOUS | Status: AC
  Administered 2017-11-24: 1250 mg via INTRAVENOUS
  Filled 2017-11-24: qty 1250

## 2017-11-24 MED ORDER — ROCURONIUM BROMIDE 10 MG/ML (PF) SYRINGE
PREFILLED_SYRINGE | INTRAVENOUS | Status: AC
  Filled 2017-11-24: qty 5

## 2017-11-24 MED ORDER — ACETAMINOPHEN 325 MG PO TABS
325.0000 mg | ORAL_TABLET | Freq: Four times a day (QID) | ORAL | Status: DC | PRN
  Administered 2017-11-25: 650 mg via ORAL
  Filled 2017-11-24: qty 2

## 2017-11-24 MED ORDER — SUGAMMADEX SODIUM 200 MG/2ML IV SOLN
INTRAVENOUS | Status: AC
  Filled 2017-11-24: qty 2

## 2017-11-24 MED ORDER — PHENOL 1.4 % MT LIQD: 1.0000 | OROMUCOSAL | Status: DC | PRN

## 2017-11-24 MED ORDER — GABAPENTIN 300 MG PO CAPS
300.0000 mg | ORAL_CAPSULE | Freq: Once | ORAL | Status: AC
  Administered 2017-11-24: 300 mg via ORAL
  Filled 2017-11-24: qty 1

## 2017-11-24 MED ORDER — ACETAMINOPHEN 500 MG PO TABS
1000.0000 mg | ORAL_TABLET | Freq: Once | ORAL | Status: AC
  Administered 2017-11-24: 1000 mg via ORAL
  Filled 2017-11-24: qty 2

## 2017-11-24 MED ORDER — FENTANYL CITRATE (PF) 100 MCG/2ML IJ SOLN
INTRAMUSCULAR | Status: DC | PRN
  Administered 2017-11-24: 50 ug via INTRAVENOUS
  Administered 2017-11-24: 100 ug via INTRAVENOUS

## 2017-11-24 MED ORDER — SUGAMMADEX SODIUM 200 MG/2ML IV SOLN
INTRAVENOUS | Status: DC | PRN
  Administered 2017-11-24: 200 mg via INTRAVENOUS

## 2017-11-24 MED ORDER — HYDROMORPHONE HCL 1 MG/ML IJ SOLN
INTRAMUSCULAR | Status: AC
  Filled 2017-11-24: qty 1

## 2017-11-24 MED ORDER — DEXAMETHASONE SODIUM PHOSPHATE 10 MG/ML IJ SOLN
INTRAMUSCULAR | Status: DC | PRN
  Administered 2017-11-24: 10 mg via INTRAVENOUS

## 2017-11-24 MED ORDER — SODIUM CHLORIDE 0.9 % IV SOLN
2.0000 g | INTRAVENOUS | Status: DC
  Administered 2017-11-24 – 2017-11-25 (×2): 2 g via INTRAVENOUS
  Filled 2017-11-24 (×3): qty 20

## 2017-11-24 MED ORDER — GABAPENTIN 300 MG PO CAPS
300.0000 mg | ORAL_CAPSULE | Freq: Three times a day (TID) | ORAL | Status: DC
  Administered 2017-11-24 – 2017-11-26 (×6): 300 mg via ORAL
  Filled 2017-11-24 (×6): qty 1

## 2017-11-24 MED ORDER — TRAMADOL HCL 50 MG PO TABS
50.0000 mg | ORAL_TABLET | Freq: Four times a day (QID) | ORAL | Status: DC
  Administered 2017-11-24 – 2017-11-26 (×7): 50 mg via ORAL
  Filled 2017-11-24 (×7): qty 1

## 2017-11-24 MED ORDER — EPINEPHRINE PF 1 MG/ML IJ SOLN
INTRAMUSCULAR | Status: DC | PRN
  Administered 2017-11-24: 3 mg

## 2017-11-24 MED ORDER — METHOCARBAMOL 500 MG PO TABS
500.0000 mg | ORAL_TABLET | Freq: Four times a day (QID) | ORAL | Status: DC | PRN
  Administered 2017-11-24 – 2017-11-26 (×5): 500 mg via ORAL
  Filled 2017-11-24 (×5): qty 1

## 2017-11-24 MED ORDER — MIDAZOLAM HCL 5 MG/5ML IJ SOLN: INTRAMUSCULAR | Status: DC | PRN

## 2017-11-24 MED ORDER — VANCOMYCIN HCL 500 MG IV SOLR
INTRAVENOUS | Status: AC
  Filled 2017-11-24: qty 500

## 2017-11-24 MED ORDER — METOCLOPRAMIDE HCL 5 MG/ML IJ SOLN: 5.0000 mg | Freq: Three times a day (TID) | INTRAMUSCULAR | Status: DC | PRN

## 2017-11-24 MED ORDER — ONDANSETRON HCL 4 MG/2ML IJ SOLN
INTRAMUSCULAR | Status: DC | PRN
  Administered 2017-11-24: 4 mg via INTRAVENOUS

## 2017-11-24 MED ORDER — HYDROMORPHONE HCL 2 MG/ML IJ SOLN
0.5000 mg | INTRAMUSCULAR | Status: DC | PRN
  Administered 2017-11-25: 0.5 mg via INTRAVENOUS
  Filled 2017-11-24 (×2): qty 1

## 2017-11-24 MED ORDER — PHENYLEPHRINE 40 MCG/ML (10ML) SYRINGE FOR IV PUSH (FOR BLOOD PRESSURE SUPPORT)
PREFILLED_SYRINGE | INTRAVENOUS | Status: DC | PRN
  Administered 2017-11-24 (×2): 80 ug via INTRAVENOUS
  Administered 2017-11-24: 40 ug via INTRAVENOUS
  Administered 2017-11-24: 120 ug via INTRAVENOUS
  Administered 2017-11-24: 80 ug via INTRAVENOUS
  Administered 2017-11-24: 160 ug via INTRAVENOUS

## 2017-11-24 MED ORDER — METOPROLOL TARTRATE 5 MG/5ML IV SOLN
INTRAVENOUS | Status: AC
Start: 2017-11-24 — End: ?
  Filled 2017-11-24: qty 5

## 2017-11-24 MED ORDER — LIDOCAINE 2% (20 MG/ML) 5 ML SYRINGE
INTRAMUSCULAR | Status: AC
  Filled 2017-11-24: qty 5

## 2017-11-24 MED ORDER — DEXAMETHASONE SODIUM PHOSPHATE 10 MG/ML IJ SOLN
INTRAMUSCULAR | Status: AC
  Filled 2017-11-24: qty 1

## 2017-11-24 MED ORDER — METHOCARBAMOL 1000 MG/10ML IJ SOLN
500.0000 mg | Freq: Four times a day (QID) | INTRAMUSCULAR | Status: DC | PRN
  Filled 2017-11-24: qty 5

## 2017-11-24 MED ORDER — MENTHOL 3 MG MT LOZG
1.0000 | LOZENGE | OROMUCOSAL | Status: DC | PRN
  Administered 2017-11-26: 3 mg via ORAL
  Filled 2017-11-24: qty 9

## 2017-11-24 MED ORDER — ONDANSETRON HCL 4 MG/2ML IJ SOLN: 4.0000 mg | Freq: Four times a day (QID) | INTRAMUSCULAR | Status: DC | PRN

## 2017-11-24 MED ORDER — ONDANSETRON HCL 4 MG/2ML IJ SOLN
INTRAMUSCULAR | Status: AC
  Filled 2017-11-24: qty 2

## 2017-11-24 MED ORDER — ACETAMINOPHEN 10 MG/ML IV SOLN: 1000.0000 mg | Freq: Once | INTRAVENOUS | Status: DC | PRN

## 2017-11-24 MED ORDER — MIDAZOLAM HCL 2 MG/2ML IJ SOLN
INTRAMUSCULAR | Status: AC
  Administered 2017-11-24: 2 mg via INTRAVENOUS
  Filled 2017-11-24: qty 2

## 2017-11-24 MED ORDER — PHENYLEPHRINE 40 MCG/ML (10ML) SYRINGE FOR IV PUSH (FOR BLOOD PRESSURE SUPPORT)
PREFILLED_SYRINGE | INTRAVENOUS | Status: AC
  Filled 2017-11-24: qty 10

## 2017-11-24 MED ORDER — FENTANYL CITRATE (PF) 100 MCG/2ML IJ SOLN
100.0000 ug | Freq: Once | INTRAMUSCULAR | Status: AC
  Administered 2017-11-24: 100 ug via INTRAVENOUS

## 2017-11-24 MED ORDER — LACTATED RINGERS IV SOLN
INTRAVENOUS | Status: AC
  Administered 2017-11-24: 17:00:00 via INTRAVENOUS

## 2017-11-24 MED ORDER — ROPIVACAINE HCL 5 MG/ML IJ SOLN
INTRAMUSCULAR | Status: DC | PRN
  Administered 2017-11-24: 30 mL via PERINEURAL

## 2017-11-24 MED ORDER — TOBRAMYCIN SULFATE 1.2 G IJ SOLR
INTRAMUSCULAR | Status: AC
  Filled 2017-11-24: qty 1.2

## 2017-11-24 MED ORDER — ONDANSETRON HCL 4 MG PO TABS: 4.0000 mg | ORAL_TABLET | Freq: Four times a day (QID) | ORAL | Status: DC | PRN

## 2017-11-24 MED ORDER — CLONIDINE HCL 0.2 MG PO TABS
0.2000 mg | ORAL_TABLET | Freq: Once | ORAL | Status: AC
  Administered 2017-11-24: 0.2 mg via ORAL
  Filled 2017-11-24: qty 1

## 2017-11-24 MED ORDER — GENTAMICIN SULFATE 40 MG/ML IJ SOLN
INTRAMUSCULAR | Status: AC
  Filled 2017-11-24: qty 4

## 2017-11-24 MED ORDER — SODIUM CHLORIDE 0.9 % IR SOLN
Status: DC | PRN
  Administered 2017-11-24: 9000 mL

## 2017-11-24 MED ORDER — PROPOFOL 10 MG/ML IV BOLUS
INTRAVENOUS | Status: DC | PRN
  Administered 2017-11-24: 200 mg via INTRAVENOUS

## 2017-11-24 MED ORDER — DEXMEDETOMIDINE HCL IN NACL 200 MCG/50ML IV SOLN
INTRAVENOUS | Status: AC
  Filled 2017-11-24: qty 50

## 2017-11-24 MED ORDER — FUROSEMIDE 20 MG PO TABS
20.0000 mg | ORAL_TABLET | Freq: Every day | ORAL | Status: DC
  Administered 2017-11-24 – 2017-11-26 (×3): 20 mg via ORAL
  Filled 2017-11-24 (×3): qty 1

## 2017-11-24 MED ORDER — EPHEDRINE SULFATE-NACL 50-0.9 MG/10ML-% IV SOSY
PREFILLED_SYRINGE | INTRAVENOUS | Status: DC | PRN
  Administered 2017-11-24: 10 mg via INTRAVENOUS

## 2017-11-24 MED ORDER — PHENYLEPHRINE HCL 10 MG/ML IJ SOLN
INTRAVENOUS | Status: DC | PRN
  Administered 2017-11-24: 30 ug/min via INTRAVENOUS

## 2017-11-24 MED ORDER — VANCOMYCIN HCL IN DEXTROSE 1-5 GM/200ML-% IV SOLN
1000.0000 mg | Freq: Three times a day (TID) | INTRAVENOUS | Status: DC
  Administered 2017-11-25 – 2017-11-26 (×4): 1000 mg via INTRAVENOUS
  Filled 2017-11-24 (×5): qty 200

## 2017-11-24 MED ORDER — FENTANYL CITRATE (PF) 100 MCG/2ML IJ SOLN
INTRAMUSCULAR | Status: AC
  Administered 2017-11-24: 100 ug via INTRAVENOUS
  Filled 2017-11-24: qty 2

## 2017-11-24 MED ORDER — METOCLOPRAMIDE HCL 5 MG PO TABS: 5.0000 mg | ORAL_TABLET | Freq: Three times a day (TID) | ORAL | Status: DC | PRN

## 2017-11-24 MED ORDER — ESMOLOL HCL 100 MG/10ML IV SOLN
INTRAVENOUS | Status: AC
  Filled 2017-11-24: qty 10

## 2017-11-24 MED ORDER — LIDOCAINE 2% (20 MG/ML) 5 ML SYRINGE
INTRAMUSCULAR | Status: DC | PRN
  Administered 2017-11-24: 100 mg via INTRAVENOUS

## 2017-11-24 MED ORDER — ROCURONIUM BROMIDE 100 MG/10ML IV SOLN
INTRAVENOUS | Status: DC | PRN
  Administered 2017-11-24: 10 mg via INTRAVENOUS
  Administered 2017-11-24: 50 mg via INTRAVENOUS
  Administered 2017-11-24 (×2): 10 mg via INTRAVENOUS
  Administered 2017-11-24: 20 mg via INTRAVENOUS

## 2017-11-24 MED ORDER — LACTATED RINGERS IV SOLN
INTRAVENOUS | Status: DC
  Administered 2017-11-24 (×2): via INTRAVENOUS

## 2017-11-24 MED ORDER — OXYCODONE HCL 5 MG PO TABS
5.0000 mg | ORAL_TABLET | ORAL | Status: DC | PRN
  Administered 2017-11-24 – 2017-11-26 (×7): 10 mg via ORAL
  Filled 2017-11-24 (×7): qty 2

## 2017-11-24 MED ORDER — PROMETHAZINE HCL 25 MG/ML IJ SOLN: 6.2500 mg | INTRAMUSCULAR | Status: DC | PRN

## 2017-11-24 MED ORDER — DOCUSATE SODIUM 100 MG PO CAPS
100.0000 mg | ORAL_CAPSULE | Freq: Two times a day (BID) | ORAL | Status: DC
  Administered 2017-11-24 – 2017-11-26 (×4): 100 mg via ORAL
  Filled 2017-11-24 (×4): qty 1

## 2017-11-24 MED ORDER — CHLORHEXIDINE GLUCONATE 4 % EX LIQD: 60.0000 mL | Freq: Once | CUTANEOUS | Status: DC

## 2017-11-24 MED ORDER — MIDAZOLAM HCL 2 MG/2ML IJ SOLN
2.0000 mg | Freq: Once | INTRAMUSCULAR | Status: AC
  Administered 2017-11-24: 2 mg via INTRAVENOUS

## 2017-11-24 MED ORDER — CEFAZOLIN SODIUM-DEXTROSE 2-4 GM/100ML-% IV SOLN
2.0000 g | INTRAVENOUS | Status: AC
  Administered 2017-11-24: 2 g via INTRAVENOUS
  Filled 2017-11-24: qty 100

## 2017-11-24 MED ORDER — EPINEPHRINE PF 1 MG/ML IJ SOLN
INTRAMUSCULAR | Status: AC
  Filled 2017-11-24: qty 4

## 2017-11-24 MED ORDER — HYDROMORPHONE HCL 2 MG/ML IJ SOLN: 0.3000 mg | INTRAMUSCULAR | Status: DC | PRN

## 2017-11-24 MED ORDER — ONDANSETRON HCL 4 MG/2ML IJ SOLN: INTRAMUSCULAR | Status: DC | PRN

## 2017-11-24 SURGICAL SUPPLY — 73 items
AID PSTN UNV HD RSTRNT DISP (MISCELLANEOUS) ×1
BLADE AVERAGE 25MMX9MM (BLADE) ×1
BLADE AVERAGE 25X9 (BLADE) ×1 IMPLANT
BLADE CUTTER GATOR 3.5 (BLADE) ×3 IMPLANT
BLADE GREAT WHITE 4.2 (BLADE) IMPLANT
BLADE GREAT WHITE 4.2MM (BLADE)
BLADE SURG 11 STRL SS (BLADE) IMPLANT
BUR OVAL 6.0 (BURR) IMPLANT
CLOSURE WOUND 1/2 X4 (GAUZE/BANDAGES/DRESSINGS) ×1
COVER SURGICAL LIGHT HANDLE (MISCELLANEOUS) ×3 IMPLANT
DRAPE INCISE IOBAN 66X45 STRL (DRAPES) ×6 IMPLANT
DRAPE STERI 35X30 U-POUCH (DRAPES) ×3 IMPLANT
DRAPE U-SHAPE 47X51 STRL (DRAPES) ×6 IMPLANT
DRSG TEGADERM 4X4.75 (GAUZE/BANDAGES/DRESSINGS) ×5 IMPLANT
DURAPREP 26ML APPLICATOR (WOUND CARE) ×3 IMPLANT
ELECT REM PT RETURN 9FT ADLT (ELECTROSURGICAL) ×3
ELECTRODE REM PT RTRN 9FT ADLT (ELECTROSURGICAL) ×1 IMPLANT
FILTER STRAW FLUID ASPIR (MISCELLANEOUS) ×3 IMPLANT
GAUZE SPONGE 4X4 12PLY STRL (GAUZE/BANDAGES/DRESSINGS) ×3 IMPLANT
GAUZE SPONGE 4X4 12PLY STRL LF (GAUZE/BANDAGES/DRESSINGS) ×3 IMPLANT
GAUZE XEROFORM 1X8 LF (GAUZE/BANDAGES/DRESSINGS) ×4 IMPLANT
GLOVE BIOGEL PI IND STRL 7.5 (GLOVE) ×1 IMPLANT
GLOVE BIOGEL PI IND STRL 8 (GLOVE) ×1 IMPLANT
GLOVE BIOGEL PI INDICATOR 7.5 (GLOVE) ×2
GLOVE BIOGEL PI INDICATOR 8 (GLOVE) ×2
GLOVE ECLIPSE 7.0 STRL STRAW (GLOVE) ×3 IMPLANT
GLOVE SURG ORTHO 8.0 STRL STRW (GLOVE) ×3 IMPLANT
GOWN STRL REUS W/ TWL LRG LVL3 (GOWN DISPOSABLE) ×3 IMPLANT
GOWN STRL REUS W/TWL LRG LVL3 (GOWN DISPOSABLE) ×9
KIT BASIN OR (CUSTOM PROCEDURE TRAY) ×3 IMPLANT
KIT STIMULAN RAPID CURE 5CC (Orthopedic Implant) ×2 IMPLANT
KIT TURNOVER KIT B (KITS) ×3 IMPLANT
MANIFOLD NEPTUNE II (INSTRUMENTS) ×3 IMPLANT
NDL HYPO 25X1 1.5 SAFETY (NEEDLE) IMPLANT
NDL SCORPION MULTI FIRE (NEEDLE) IMPLANT
NDL SPNL 18GX3.5 QUINCKE PK (NEEDLE) ×1 IMPLANT
NDL SUT 6 .5 CRC .975X.05 MAYO (NEEDLE) IMPLANT
NEEDLE HYPO 25X1 1.5 SAFETY (NEEDLE) IMPLANT
NEEDLE MAYO TAPER (NEEDLE)
NEEDLE SCORPION MULTI FIRE (NEEDLE) IMPLANT
NEEDLE SPNL 18GX3.5 QUINCKE PK (NEEDLE) ×3 IMPLANT
NS IRRIG 1000ML POUR BTL (IV SOLUTION) ×3 IMPLANT
PACK SHOULDER (CUSTOM PROCEDURE TRAY) ×3 IMPLANT
PAD ARMBOARD 7.5X6 YLW CONV (MISCELLANEOUS) ×6 IMPLANT
PORT APPOLLO RF 90DEGREE MULTI (SURGICAL WAND) ×2 IMPLANT
RESTRAINT HEAD UNIVERSAL NS (MISCELLANEOUS) ×3 IMPLANT
SET ARTHROSCOPY TUBING (MISCELLANEOUS) ×3
SET ARTHROSCOPY TUBING LN (MISCELLANEOUS) ×1 IMPLANT
SLING ARM IMMOBILIZER LRG (SOFTGOODS) ×2 IMPLANT
SPONGE LAP 4X18 X RAY DECT (DISPOSABLE) ×6 IMPLANT
STRIP CLOSURE SKIN 1/2X4 (GAUZE/BANDAGES/DRESSINGS) ×2 IMPLANT
SUCTION FRAZIER HANDLE 10FR (MISCELLANEOUS) ×2
SUCTION TUBE FRAZIER 10FR DISP (MISCELLANEOUS) ×1 IMPLANT
SUT 2 FIBERLOOP 20 STRT BLUE (SUTURE)
SUT ETHILON 3 0 PS 1 (SUTURE) ×6 IMPLANT
SUT FIBERWIRE #2 38 T-5 BLUE (SUTURE)
SUT MNCRL AB 3-0 PS2 18 (SUTURE) ×5 IMPLANT
SUT VIC AB 0 CT1 27 (SUTURE) ×3
SUT VIC AB 0 CT1 27XBRD ANBCTR (SUTURE) ×1 IMPLANT
SUT VIC AB 1 CT1 27 (SUTURE)
SUT VIC AB 1 CT1 27XBRD ANBCTR (SUTURE) IMPLANT
SUT VIC AB 2-0 CT1 27 (SUTURE) ×6
SUT VIC AB 2-0 CT1 TAPERPNT 27 (SUTURE) ×1 IMPLANT
SUT VICRYL 0 UR6 27IN ABS (SUTURE) ×3 IMPLANT
SUTURE 2 FIBERLOOP 20 STRT BLU (SUTURE) IMPLANT
SUTURE FIBERWR #2 38 T-5 BLUE (SUTURE) IMPLANT
SYR 20CC LL (SYRINGE) ×6 IMPLANT
SYR 3ML LL SCALE MARK (SYRINGE) ×3 IMPLANT
SYR TB 1ML LUER SLIP (SYRINGE) ×3 IMPLANT
TOWEL OR 17X24 6PK STRL BLUE (TOWEL DISPOSABLE) ×3 IMPLANT
TOWEL OR 17X26 10 PK STRL BLUE (TOWEL DISPOSABLE) ×3 IMPLANT
WAND HAND CNTRL MULTIVAC 90 (MISCELLANEOUS) IMPLANT
WATER STERILE IRR 1000ML POUR (IV SOLUTION) ×3 IMPLANT

## 2017-11-24 NOTE — Transfer of Care (Signed)
Immediate Anesthesia Transfer of Care Note  Patient: Jonathan Meadows  Procedure(s) Performed: LEFT SHOULDER ARTHROSCOPY, OPEN DISTAL CLAVICLE EXCISION, EXTENSIVE INTRA-ARTICULAR AND EXTRA-ARTICULAR DEBRIDEMENT (Left Shoulder)  Patient Location: PACU  Anesthesia Type:General  Level of Consciousness: drowsy and patient cooperative  Airway & Oxygen Therapy: Patient Spontanous Breathing and Patient connected to face mask oxygen  Post-op Assessment: Report given to RN  Post vital signs: Reviewed and stable  Last Vitals:  Vitals Value Taken Time  BP 120/78 11/24/2017  2:00 PM  Temp    Pulse 69 11/24/2017  2:04 PM  Resp 15 11/24/2017  2:04 PM  SpO2 99 % 11/24/2017  2:04 PM  Vitals shown include unvalidated device data.  Last Pain:  Vitals:   11/24/17 0845  TempSrc:   PainSc: 7       Patients Stated Pain Goal: 4 (33/43/56 8616)  Complications: No apparent anesthesia complications

## 2017-11-24 NOTE — Anesthesia Procedure Notes (Signed)
Procedure Name: Intubation Date/Time: 11/24/2017 11:34 AM Performed by: Gwyndolyn Saxon, CRNA Pre-anesthesia Checklist: Patient identified, Emergency Drugs available, Suction available, Patient being monitored and Timeout performed Patient Re-evaluated:Patient Re-evaluated prior to induction Oxygen Delivery Method: Circle system utilized Preoxygenation: Pre-oxygenation with 100% oxygen Induction Type: IV induction Ventilation: Mask ventilation without difficulty Laryngoscope Size: Miller and 2 Grade View: Grade I Tube type: Oral Tube size: 8.0 mm Number of attempts: 1 Placement Confirmation: ETT inserted through vocal cords under direct vision,  positive ETCO2,  CO2 detector and breath sounds checked- equal and bilateral Secured at: 24 cm Tube secured with: Tape Dental Injury: Teeth and Oropharynx as per pre-operative assessment

## 2017-11-24 NOTE — Consult Note (Signed)
Jonathan Meadows for Infectious Disease    Date of Admission:  11/24/2017   Total days of antibiotics 1               Reason for Consult: Septic arthritis of L shoulder and osteomyelitis    Referring Provider: Dr. Marlou Sa  Primary Care Provider: No PCP   Assessment: Jonathan Meadows is a 47 y.o. male with history of schizophrenia, chronic right lower extremity lymphedema of uncertain etiology, and recent septic arthritis of L shoulder (10/27/2017) s/p arthroscopic debridement who presented 5/28 for further debridement after repeat MRI showed new abscess below AC, septic tenosynovitis, myositis, and concern for osteomyelitis of humeral head and glenoid. He is s/p arthroscopic debridement today. Source unclear at this time, ?RLE cellulitis which he was also treated for in April admission. Gram stain negative. Culture pending.   Plan: 1. Septic arthritis of L shoulder and possible osteomyelitis of humeral head and glenoid: Start vancomycin and ceftriaxone for both gram positive and gram negative coverage pending culture results.                  Active Problems:   * No active hospital problems. *   Scheduled Meds: . bupivacaine liposome  20 mL Infiltration Once  . chlorhexidine  60 mL Topical Once  . chlorhexidine  60 mL Topical Once   Continuous Infusions: . acetaminophen    . lactated ringers 10 mL/hr at 11/24/17 0854   PRN Meds:.acetaminophen, HYDROcodone-acetaminophen, HYDROmorphone (DILAUDID) injection, meperidine (DEMEROL) injection, promethazine  HPI: Jonathan Meadows is a 47 y.o. male with history of schizophrenia, chronic right lower extremity lymphedema of uncertain etiology, and recent septic L shoulder (10/27/2017) s/p arthroscopic debridement who presents today for further arthroscopic debridement. Patient was found to have a septic L shoulder on 09/2017 and underwent debridement by Dr. Sharol Given on 4/14. Gram stain at the time showed GPC in clusters but pre-op and intraoperative  cultures were without growth as patient had received vancomycin prior to cultures. At that time , there was no purulence noted in the wound. Given significant social barriers, he was prescribed Zyvox 600 mg BID x 4 weeks. He continued to experience L shoulder pain prompting a repeat MRI on 5/14 that was concerning for osteomyelitis and new abscess at [facility] joint. He was advised to hold Zyvox at this time. He underwent further debridement today (5/28).Gram stain negative. Culture pending. Reports chills and decreased ROM in L shoulder prior to presentation to [facility]. Denies fever, night sweats, abdominal pain, and diarrhea. He denies trauma and surgical procedures. Denies IVDU.    Review of Systems: Review of Systems  Constitutional: Positive for chills. Negative for fever and malaise/fatigue.  Respiratory: Negative for shortness of breath.   Cardiovascular: Negative for chest pain and palpitations.  Musculoskeletal: Positive for joint pain.  Neurological: Positive for weakness.    Past Medical History:  Diagnosis Date  . Anemia   . Cellulitis   . Homelessness   . Lymphedema of leg   . Paranoid schizophrenia ([place])    pt denies  . Septic joint of left shoulder region [facility]) 10/27/2017    Social History   Tobacco Use  . Smoking status: Never Smoker  . Smokeless tobacco: Never Used  Substance Use Topics  . Alcohol use: No  . Drug use: No    Family History  Problem Relation Age of Onset  . CAD Unknown        Neg HX  .  Hypertension Unknown        Neg Hx  . Diabetes Unknown        Neg Hx  . Cancer Unknown        Neg Hx  . Lupus Mother    Allergies  Allergen Reactions  . Pork-Derived Products Nausea And Vomiting    OBJECTIVE: Blood pressure 109/79, pulse 65, temperature (!) 97.3 F (36.3 C), resp. rate 12, height 6\' 1"  (1.854 m), weight 200 lb (90.7 kg), SpO2 98 %.  Physical Exam  Constitutional: He is oriented to person, place, and time. He appears well-developed and  well-nourished. No distress.  HENT:  Head: Normocephalic and atraumatic.  Eyes: Pupils are equal, round, and reactive to light. Conjunctivae are normal.  Neck: Normal range of motion. Neck supple.  Cardiovascular: Normal rate, regular rhythm and normal heart sounds. Exam reveals no gallop and no friction rub.  No murmur heard. Pulmonary/Chest: Effort normal and breath sounds normal. No respiratory distress.  Abdominal: Soft. Bowel sounds are normal. There is no tenderness.  Musculoskeletal:  Seen post-op and L shoulder numb, non-tender and with minimal ROM. RLE lymphedema (chronic) noted. Wearing compression stocking.   Neurological: He is alert and oriented to person, place, and time.  Skin: No rash noted. No erythema.    Lab Results Lab Results  Component Value Date   WBC 5.3 11/24/2017   HGB 12.1 (L) 11/24/2017   HCT 37.5 (L) 11/24/2017   MCV 93.5 11/24/2017   PLT 287 11/24/2017    Lab Results  Component Value Date   CREATININE 0.99 11/24/2017   BUN 7 11/24/2017   NA 141 11/24/2017   K 4.2 11/24/2017   CL 105 11/24/2017   CO2 28 11/24/2017    Lab Results  Component Value Date   ALT 16 10/27/2017   AST 16 10/27/2017   ALKPHOS 69 10/08/2017   BILITOT 0.3 10/27/2017     Microbiology: No results found for this or any previous visit (from the past 240 hour(s)).  Welford Roche, [place] for [place] [phone] pager   [phone] cell 11/24/2017, 2:36 PM

## 2017-11-24 NOTE — Op Note (Signed)
Jonathan Meadows, Jonathan Meadows MEDICAL RECORD [id number] ACCOUNT [id number] DATE OF BIRTH:Sep 12, 1970 FACILITY: [facility] LOCATION: [facility] PHYSICIAN:Arlis Yale Randel Pigg, MD  OPERATIVE REPORT  DATE OF PROCEDURE:  11/24/2017  PREOPERATIVE DIAGNOSIS:  Recurrent left shoulder infection.  POSTOPERATIVE DIAGNOSIS:  Recurrent left shoulder infection.  PROCEDURES:  Left shoulder arthroscopy with extensive intra-articular debridement and rotator interval release followed by open distal clavicle excision and open subacromial decompression with further debridement.  Placement of antibiotic beads also  performed into the joint and subacromial space.  SURGEON:   Alphonzo Severance, MD  ASSISTANT:  Talbert Nan, RNFA.   INDICATIONS:  The patient is a 47 year old with left shoulder pain.  Presents now for operative management after explanation of risks and benefits.  One month ago with Dr. Sharol Given, he had initial debridement performed.  Recurrent infection has occurred and  he presents now for further management.  DESCRIPTION OF PROCEDURE:  The patient was brought to the operating room where general endotracheal anesthesia was induced.  Preoperative IV antibiotics were not administered until cultures were obtained.  Time out was called.  The patient's head was in  a neutral position.  Left shoulder was prescrubbed with alcohol and Betadine and allowed to air dry.  Prep of DuraPrep solution and draped in a sterile manner.  Posterior portal was created 2 cm medial and inferior to the posterolateral margin of the  acromion.  Cultures were obtained from this intra-articular placement.  The patient's shoulder was fairly stiff preoperatively.  The anterior portal created under direct visualization.  Rotator cuff was intact.  Debridement was performed along the  attachment site of the rotator cuff.  Rotator interval was released.  Arthrocare wand was used to coagulate synovitis.  The glenohumeral articular surfaces were intact.  Following  extensive intraarticular debridement, antibiotic beads were placed into  the axillary pouch as well as the intra-articular portion of the shoulder joint, but not between the glenoid and the humeral head itself.  With these antibiotic beads placed under direct visualization, the cannula was removed.  Attention was then  directed towards the distal clavicle and subacromial space.  An incision was made over the distal clavicle.  Skin and subcutaneous tissue were sharply divided.  A subperiosteal dissection was performed and the distal 8 mm of the clavicle were resected.   The bone itself did not appear or feel infected or soft.  Attention was then directed towards the subacromial space.  A second incision was made off the anterolateral margin of the acromion.  Skin and subcutaneous tissue were sharply divided.  Deltoid  split a measured distance of 4 cm from the anterolateral margin of the acromion.  A complete bursectomy was performed.  Cobb was used to open up all space between the undersurface of the acromion as well as within the rotator cuff medial to the glenoid.   At this time, thorough irrigation was performed.  Cultures were obtained prior to irrigating, however.  Thorough irrigation was performed in both the shoulder joint with 9 liters as well as in the subacromial space with about 4 liters.  Antibiotic beads  also placed into the subacromial space.  Deltoid split then closed using #1 Vicryl suture followed by interrupted inverted 0 Vicryl suture.  Distal clavicle excision site closed using 0 Vicryl suture to reapproximate the fascia followed by interrupted 0  Vicryl suture, interrupted inverted 2-0 Vicryl suture and a 3-0 Monocryl.  Waterproof dressing was placed.    The patient tolerated the procedure well without immediate complications.  He  was transferred to the recovery room in stable condition.  AN/NUANCE  D:11/24/2017 T:11/24/2017 JOB:[id number]

## 2017-11-24 NOTE — Progress Notes (Signed)
Pt educated on importance for his safety of calling for help before getting out of bed due to recent procedure with anesthesia, IVF running, and continuous pulse ox connection. Pt insists upon ambulating freely in the room by himself with no supervision. Will continue to educate and monitor pt's safety.

## 2017-11-24 NOTE — Progress Notes (Signed)
Pharmacy Antibiotic Note  Jonathan Meadows is a 47 y.o. male admitted on 11/24/2017 with septic arthritis.  Pharmacy has been consulted for vancomycin dosing.  SCr appears to be at baseline. Patient had arthroscopic surgery on 4/14 for septic shoulder, nothing grew out of cultures at the time. Scans show fluid recollection and patient went to OR today with ortho.  Patient received pre-op Ancef 2g at ~1330 today.   Plan: Vancomycin 1250mg  IV x1, then 1g IV q8h  Ceftriaxone 2g IV q24h per MD Follow c/s, clinical progression, renal function, level PRN, LOT  Height: 6\' 1"  (185.4 cm) Weight: 200 lb (90.7 kg) IBW/kg (Calculated) : 79.9  Temp (24hrs), Avg:97.5 F (36.4 C), Min:97.3 F (36.3 C), Max:97.8 F (36.6 C)  Recent Labs  Lab 11/24/17 0836  WBC 5.3  CREATININE 0.99    Estimated Creatinine Clearance: 104.2 mL/min (by C-G formula based on SCr of 0.99 mg/dL).    Allergies  Allergen Reactions  . Pork-Derived Products Nausea And Vomiting    Antimicrobials this admission: Vancomycin 5/28 >>  Ceftriaxone  5/28 >>   Dose adjustments this admission: n/a  Microbiology results: 5/28 shoulder deep swab: 5/28 shoulder sub acromial space:  Thank you for allowing pharmacy to be a part of this patient's care.  Tulip Meharg D. Potter, PharmD, [place] Clinical Pharmacist [phone] 11/24/2017 4:41 PM

## 2017-11-24 NOTE — Brief Op Note (Signed)
11/24/2017  2:14 PM  PATIENT:  Freida Busman  47 y.o. male  PRE-OPERATIVE DIAGNOSIS:  LEFT SHOULDER INFECTION  POST-OPERATIVE DIAGNOSIS:  LEFT SHOULDER INFECTION  PROCEDURE:  Procedure(s): LEFT SHOULDER ARTHROSCOPY, OPEN DISTAL CLAVICLE EXCISION, EXTENSIVE INTRA-ARTICULAR AND EXTRA-ARTICULAR DEBRIDEMENT placement of abx beads  SURGEON:  Surgeon(s): Marlou Sa, Tonna Corner, MD  ASSISTANT: Laure Kidney rnfa  ANESTHESIA:   general  EBL: 50  ml    Total I/O In: 1300 [I.V.:1300] Out: 50 [Blood:50]  BLOOD ADMINISTERED: none  DRAINS: none   LOCAL MEDICATIONS USED:  none  SPECIMEN: Intra-articular and extra-articular culture sent COUNTS:  YES  TOURNIQUET:  * No tourniquets in log *  DICTATION: .Other Dictation: Dictation Number [id number]  PLAN OF CARE: Admit to inpatient   PATIENT DISPOSITION:  PACU - hemodynamically stable

## 2017-11-24 NOTE — Anesthesia Procedure Notes (Signed)
Anesthesia Regional Block: Interscalene brachial plexus block   Pre-Anesthetic Checklist: ,, timeout performed, Correct Patient, Correct Site, Correct Laterality, Correct Procedure, Correct Position, site marked, Risks and benefits discussed, at surgeon's request and post-op pain management  Laterality: Upper and Left  Prep: chloraprep       Needles:  Injection technique: Single-shot  Needle Type: Echogenic Needle     Needle Length: 5cm  Needle Gauge: 22     Additional Needles:   Procedures:, nerve stimulator,,,,,,,  Narrative:  Start time: 11/24/2017 9:55 AM End time: 11/24/2017 10:05 AM Injection made incrementally with aspirations every 5 mL.  Performed by: Personally  Anesthesiologist: Barnet Glasgow, MD

## 2017-11-24 NOTE — H&P (Signed)
Jonathan Meadows is an 47 y.o. male.   Chief Complaint: Left shoulder pain HPI: Jonathan Meadows is a patient with left shoulder pain.  He reports infection treated by arthroscopic debridement about a month ago.  Cultures remain negative but Gram stain was positive.  He had subsequent repeat MRI scanning which suggested persistent infection.  Plan at this time is arthroscopic debridement of the joint with open treatment in the subacromial space and AC joint.  Past Medical History:  Diagnosis Date  . Anemia   . Cellulitis   . Homelessness   . Lymphedema of leg   . Paranoid schizophrenia ([place])    pt denies  . Septic joint of left shoulder region [facility]) 10/27/2017    Past Surgical History:  Procedure Laterality Date  . SHOULDER ARTHROSCOPY Left 10/11/2017   Procedure: ARTHROSCOPY SHOULDER, LEFT;  Surgeon: Newt Minion, MD;  Location: [facility];  Service: Orthopedics;  Laterality: Left;    Family History  Problem Relation Age of Onset  . CAD Unknown        Neg HX  . Hypertension Unknown        Neg Hx  . Diabetes Unknown        Neg Hx  . Cancer Unknown        Neg Hx  . Lupus Mother    Social History:  reports that he has never smoked. He has never used smokeless tobacco. He reports that he does not drink alcohol or use drugs.  Allergies:  Allergies  Allergen Reactions  . Pork-Derived Products Nausea And Vomiting    Medications Prior to Admission  Medication Sig Dispense Refill  . furosemide (LASIX) 20 MG tablet Take 20 mg by mouth daily.    Marland Kitchen oxyCODONE-acetaminophen (PERCOCET/ROXICET) 5-325 MG tablet Take 1 tablet by mouth every 8 (eight) hours as needed for severe pain. 20 tablet 0  . benzonatate (TESSALON) 100 MG capsule Take 1 capsule (100 mg total) by mouth 3 (three) times daily as needed for cough. (Patient not taking: Reported on 11/04/2017) 21 capsule 0  . linezolid (ZYVOX) 600 MG tablet Take 1 tablet (600 mg total) by mouth 2 (two) times daily. (Patient not taking: Reported on 11/18/2017)  60 tablet 0  . loperamide (IMODIUM) 2 MG capsule Take 1 capsule (2 mg total) by mouth 4 (four) times daily as needed for diarrhea or loose stools. (Patient not taking: Reported on 11/18/2017) 12 capsule 0  . traMADol (ULTRAM) 50 MG tablet Take 1 tablet (50 mg total) by mouth every 12 (twelve) hours as needed. (Patient not taking: Reported on 11/18/2017) 10 tablet 0    Results for orders placed or performed during the hospital encounter of 11/24/17 (from the past 48 hour(s))  Basic metabolic panel     Status: None   Collection Time: 11/24/17  8:36 AM  Result Value Ref Range   Sodium 141 135 - 145 mmol/L   Potassium 4.2 3.5 - 5.1 mmol/L   Chloride 105 101 - 111 mmol/L   CO2 28 22 - 32 mmol/L   Glucose, Bld 85 65 - 99 mg/dL   BUN 7 6 - 20 mg/dL   Creatinine, Ser 0.99 0.61 - 1.24 mg/dL   Calcium 9.4 8.9 - 10.3 mg/dL   GFR calc non Af Amer >60 >60 mL/min   GFR calc Af Amer >60 >60 mL/min    Comment: (NOTE) The eGFR has been calculated using the CKD EPI equation. This calculation has not been validated in all clinical situations. eGFR's  persistently <60 mL/min signify possible Chronic Kidney Disease.    Anion gap 8 5 - 15    Comment: Performed at [facility] [address] [place], [place] [address]  CBC     Status: Abnormal   Collection Time: 11/24/17  8:36 AM  Result Value Ref Range   WBC 5.3 4.0 - 10.5 K/uL   RBC 4.01 (L) 4.22 - 5.81 MIL/uL   Hemoglobin 12.1 (L) 13.0 - 17.0 g/dL   HCT 37.5 (L) 39.0 - 52.0 %   MCV 93.5 78.0 - 100.0 fL   MCH 30.2 26.0 - 34.0 pg   MCHC 32.3 30.0 - 36.0 g/dL   RDW 14.7 11.5 - 15.5 %   Platelets 287 150 - 400 K/uL    Comment: Performed at [facility], [place] [address] [place], [place] [address]   No results found.  Review of Systems  Musculoskeletal: Positive for joint pain.  All other systems reviewed and are negative.   Blood pressure (!) 114/94, pulse 63, temperature 97.8 F (36.6 C), temperature source Oral, resp. rate 16,  height 6' 1"  (1.854 m), weight 200 lb (90.7 kg), SpO2 100 %. Physical Exam  Constitutional: He appears well-developed.  HENT:  Head: Normocephalic.  Eyes: Pupils are equal, round, and reactive to light.  Neck: Normal range of motion.  Cardiovascular: Normal rate.  Respiratory: Effort normal.  Neurological: He is alert.  Skin: Skin is warm.  Psychiatric: He has a normal mood and affect.  Left shoulder demonstrates reasonable passive range of motion but pain with any motion over 90 degrees of forward flexion or abduction.  Rotator cuff strength seems reasonable.  No lymphadenopathy around that shoulder girdle region.  Not much in the way of warmth asymmetrically left shoulder versus right  Assessment/Plan Impression is recurrent swelling effusion and likely infection in the shoulder status post arthroscopy x1 done by my partner about 4 weeks ago.  Patient has been off antibiotics in order to try to achieve organism identification with this surgery.  Gram-positive organisms present at the first surgery.  Plan is arthroscopy with shoulder joint washout followed by open distal clavicle excision and open I&D within the subacromial space.  Patient has failed arthroscopic attempt at this once.  We will plan to use antibiotic impregnated beads.  Patient understands the risk and benefits.  All questions answered we will also obtain cultures prior to antibiotic treatment.  Anderson Malta, MD 11/24/2017, 11:06 AM

## 2017-11-24 NOTE — Progress Notes (Signed)
Pt arrived to room 5N12 via bed. All questions answered to satisfaction. Received report from [place], RN from PACU. See assessment. Will continue to monitor.

## 2017-11-24 NOTE — Anesthesia Postprocedure Evaluation (Signed)
Anesthesia Post Note  Patient: Jerusalem Wert  Procedure(s) Performed: LEFT SHOULDER ARTHROSCOPY, OPEN DISTAL CLAVICLE EXCISION, EXTENSIVE INTRA-ARTICULAR AND EXTRA-ARTICULAR DEBRIDEMENT (Left Shoulder)     Patient location during evaluation: PACU Anesthesia Type: Regional and General Level of consciousness: awake and alert Pain management: pain level controlled Vital Signs Assessment: post-procedure vital signs reviewed and stable Respiratory status: spontaneous breathing, nonlabored ventilation, respiratory function stable and patient connected to nasal cannula oxygen Cardiovascular status: blood pressure returned to baseline and stable Postop Assessment: no apparent nausea or vomiting Anesthetic complications: no    Last Vitals:  Vitals:   11/24/17 1615 11/24/17 1653  BP:  123/90  Pulse: 67 84  Resp: (!) 21   Temp:    SpO2: 93% 100%    Last Pain:  Vitals:   11/24/17 1615  TempSrc:   PainSc: 0-No pain                 Barnet Glasgow

## 2017-11-25 ENCOUNTER — Encounter (HOSPITAL_COMMUNITY): Payer: Self-pay | Admitting: Orthopedic Surgery

## 2017-11-25 DIAGNOSIS — Z590 Homelessness: Secondary | ICD-10-CM

## 2017-11-25 DIAGNOSIS — D649 Anemia, unspecified: Secondary | ICD-10-CM

## 2017-11-25 DIAGNOSIS — G4700 Insomnia, unspecified: Secondary | ICD-10-CM

## 2017-11-25 DIAGNOSIS — L0390 Cellulitis, unspecified: Secondary | ICD-10-CM

## 2017-11-25 LAB — BASIC METABOLIC PANEL
Anion gap: 11 (ref 5–15)
BUN: 12 mg/dL (ref 6–20)
CHLORIDE: 102 mmol/L (ref 101–111)
CO2: 24 mmol/L (ref 22–32)
CREATININE: 1.02 mg/dL (ref 0.61–1.24)
Calcium: 9.3 mg/dL (ref 8.9–10.3)
GFR calc Af Amer: >60 mL/min (ref >60)
Glucose, Bld: 104 mg/dL — ABNORMAL HIGH (ref 65–99)
Potassium: 3.7 mmol/L (ref 3.5–5.1)
SODIUM: 137 mmol/L (ref 135–145)

## 2017-11-25 LAB — CBC
HCT: 33.3 % — ABNORMAL LOW (ref 39.0–52.0)
Hemoglobin: 10.9 g/dL — ABNORMAL LOW (ref 13.0–17.0)
MCH: 30.2 pg (ref 26.0–34.0)
MCHC: 32.7 g/dL (ref 30.0–36.0)
MCV: 92.2 fL (ref 78.0–100.0)
PLATELETS: 272 10*3/uL (ref 150–400)
RBC: 3.61 MIL/uL — ABNORMAL LOW (ref 4.22–5.81)
RDW: 14.6 % (ref 11.5–15.5)
WBC: 8.7 10*3/uL (ref 4.0–10.5)

## 2017-11-25 LAB — GLUCOSE, CAPILLARY: Glucose-Capillary: 110 mg/dL — ABNORMAL HIGH (ref 65–99)

## 2017-11-25 MED ORDER — ALPRAZOLAM 0.5 MG PO TABS
1.0000 mg | ORAL_TABLET | Freq: Three times a day (TID) | ORAL | Status: DC | PRN
  Administered 2017-11-25 – 2017-11-26 (×3): 1 mg via ORAL
  Filled 2017-11-25 (×3): qty 2

## 2017-11-25 MED ORDER — MUSCLE RUB 10-15 % EX CREA
TOPICAL_CREAM | CUTANEOUS | Status: DC | PRN
  Administered 2017-11-25: 23:00:00 via TOPICAL
  Filled 2017-11-25: qty 85

## 2017-11-25 NOTE — Progress Notes (Signed)
Subjective: Pt stable  - pain ok - ot working with patient   Objective: Vital signs in last 24 hours: Temp:  [97.3 F (36.3 C)-98 F (36.7 C)] 97.9 F (36.6 C) (05/29 0014) Pulse Rate:  [64-95] 76 (05/29 0014) Resp:  [12-22] 17 (05/29 0014) BP: (101-137)/(71-93) 130/76 (05/29 0014) SpO2:  [93 %-100 %] 100 % (05/29 0014)  Intake/Output from previous day: 05/28 0701 - 05/29 0700 In: 5376.3 [P.O.:3960; I.V.:1316.3; IV Piggyback:100] Out: 550 [Urine:500; Blood:50] Intake/Output this shift: Total I/O In: 680 [P.O.:480; IV Piggyback:200] Out: -   Exam:  No cellulitis present  Labs: Recent Labs    11/24/17 0836 11/25/17 0643  HGB 12.1* 10.9*   Recent Labs    11/24/17 0836 11/25/17 0643  WBC 5.3 8.7  RBC 4.01* 3.61*  HCT 37.5* 33.3*  PLT 287 272   Recent Labs    11/24/17 0836 11/25/17 0643  NA 141 137  K 4.2 3.7  CL 105 102  CO2 28 24  BUN 7 12  CREATININE 0.99 1.02  GLUCOSE 85 104*  CALCIUM 9.4 9.3   No results for input(s): LABPT, INR in the last 72 hours.  Assessment/Plan: Plan for dc am - id consult on board but will need recs before dc am   Jonathan Meadows 11/25/2017, 12:16 PM

## 2017-11-25 NOTE — Progress Notes (Signed)
[facility] Infusion Coordinator will follow pt with ID team to [facility] services for home IV ABX at DC if needed.  If patient discharges after hours, please call [phone].   Jonathan Meadows 11/25/2017, 12:22 PM

## 2017-11-25 NOTE — Progress Notes (Addendum)
Pt has been up all night. He has been walking up and down the hall, entered other patients rooms a few times as well. He has his headphones on and sings/talks very loudly disturbing other patients.  Makes inappropriate comments to RNs and NTs. Security was called to assist in redirecting patient. Pt complied by staying in his room for about an hour. This RN was in another pts room with door closed and this pt could be heard yelling for this RN. Came out of the room to inform pt the proper way to ask for assistance is to press the call bell.  06:22 am  Pt is falling asleep in chair. This RN and NT have asked pt to go to the bed. Pt refuses. Advised pt that we do not want him to fall. Pt is agitated, and remains in chair (not the recliner a straight back chair.)

## 2017-11-25 NOTE — Evaluation (Signed)
Occupational Therapy Evaluation  Patient Details Name: Jonathan Meadows MRN: [id number] DOB: 1970-12-10 Today's Date: 11/25/2017    History of Present Illness Pt is a 47 y.o. male s/p L shoulder arthroscopy, open distal clavicle excision, extensive intra-articular and extra-articular debridement with placement of antibiotic beads. Pt had arthroscopic debridement 4/14 to treat infection. Repeat MRI revealing persistent infection and thus pt was taken for further surgical intervention. PMH significant for anemia, cellulitis, homelessness, lymphedema of leg, paranoid schizophrenia, and septic joint of L shoulder region.   Clinical Impression   PTA, pt was independent with ADL and functional mobility per his report. He had been participating in outpatient PT for left shoulder since previous arthroscopic surgery in April. He is known to this OT from previous admission. Pt demonstrating decreased executive functioning skills throughout session today and making inappropriate comments toward OT and other staff and I attempted to redirect throughout. Initiated education concerning sling management, compensatory ADL strategies, and L shoulder HEP as directed by MD with handouts provided. Pt able to complete HEP with supervision and verbal cues and requires min assist for UB dressing and sling management. He is ambulating with modified independence for ADL transfers. Pt would benefit from continued OT services while admitted to improve independence and safety with ADL and to facilitate rehabilitation of shoulder for functional use. Recommend outpatient therapy follow-up post-acute D/C.    Follow Up Recommendations  Supervision/Assistance - 24 hour;Follow surgeon's recommendation for DC plan and follow-up therapies;Outpatient OT([facility] of shoulder per MD )    Equipment Recommendations  3 in 1 bedside commode    Recommendations for Other Services       Precautions / Restrictions  Precautions Precautions: Fall;Shoulder Type of Shoulder Precautions: Active protocol: sling for comfort, NWB L UE, AROM/PROM L shoulder (forward flexion 0-140, abduction 0-60, external rotation 0-30), AROm elbow/wrist/hand Shoulder Interventions: Shoulder sling/immobilizer;For comfort Precaution Booklet Issued: Yes (comment) Precaution Comments: Reviewed precautions in detail with handout provided.  Restrictions Weight Bearing Restrictions: Yes LUE Weight Bearing: Non weight bearing      Mobility Bed Mobility               General bed mobility comments: OOB ambulating on my arrival.   Transfers Overall transfer level: Modified independent                    Balance Overall balance assessment: Mild deficits observed, not formally tested                                         ADL either performed or assessed with clinical judgement   ADL Overall ADL's : Needs assistance/impaired Eating/Feeding: Set up;Sitting   Grooming: Modified independent   Upper Body Bathing: Supervision/ safety;Standing   Lower Body Bathing: Sit to/from stand;Supervison/ safety   Upper Body Dressing : Sitting;Minimal assistance Upper Body Dressing Details (indicate cue type and reason): assist for sling Lower Body Dressing: Supervision/safety;Sit to/from stand   Toilet Transfer: Terry and Hygiene: Modified independent       Functional mobility during ADLs: Modified independent General ADL Comments: Pt has been ambulating in hallway throughout the day and night. Educated concerning compensatory strategies for UB ADL. Pt educated concerning donning/doffing sling and requiring min assist.      Vision Baseline Vision/History: No visual deficits Patient Visual Report: No change from baseline Vision Assessment?: No apparent visual  deficits     Perception     Praxis      Pertinent Vitals/Pain Pain Assessment:  Faces Faces Pain Scale: Hurts little more Pain Location: L shoulder Pain Descriptors / Indicators: Aching;Operative site guarding;Sore Pain Intervention(s): Limited activity within patient's tolerance;Monitored during session;Repositioned     Hand Dominance     Extremity/Trunk Assessment Upper Extremity Assessment Upper Extremity Assessment: LUE deficits/detail LUE Deficits / Details: Decreased strength and ROM post-operatively. Reports of post-operative pain. L shoulder AAROM forward flexion 0-90 achieved, external rotation 0-30 achieved, abduction 0-45 achieved. Elbow, wrist, hand in tact.  LUE: Unable to fully assess due to pain LUE Sensation: WNL   Lower Extremity Assessment Lower Extremity Assessment: Defer to PT evaluation       Communication Communication Communication: No difficulties   Cognition Arousal/Alertness: Awake/alert Behavior During Therapy: WFL for tasks assessed/performed Overall Cognitive Status: No family/caregiver present to determine baseline cognitive functioning                                 General Comments: Pt with history of paranouid schizophrenia and signs noted throughout session. Pt making inappropriate comments and with difficulty maintaining safety and following through with directions. Noted poor executive functioning skills. Cognition diminished compared to this OT's previous work with this pt.    General Comments  Pt making inappropriate remarks throughout session. Attempted to redirect pt multiple times.     Exercises Exercises: Shoulder Shoulder Exercises Shoulder Flexion: AAROM;Left;10 reps;Supine(supine in recliner for support; 0-140 only, achieved 0-90) Shoulder ABduction: AAROM;10 reps;Seated(achieving ~0-45 today; 0-60 only) Shoulder External Rotation: AAROM;Right;10 reps;Seated(achieved ~0-30) Elbow Flexion: AROM;Left;10 reps;Seated Elbow Extension: AROM;Left;10 reps;Seated Wrist Flexion: AROM;Left;10  reps;Seated Wrist Extension: AROM;Left;10 reps;Seated Digit Composite Flexion: AROM;Left;10 reps;Seated Composite Extension: AROM;Left;10 reps;Seated   Shoulder Instructions Shoulder Instructions Donning/doffing shirt without moving shoulder: Minimal assistance Method for sponge bathing under operated UE: Supervision/safety Donning/doffing sling/immobilizer: Minimal assistance Correct positioning of sling/immobilizer: Minimal assistance ROM for elbow, wrist and digits of operated UE: Supervision/safety Sling wearing schedule (on at all times/off for ADL's): Supervision/safety Proper positioning of operated UE when showering: Supervision/safety    Home Living Family/patient expects to be discharged to:: Private residence Living Arrangements: Alone   Type of Home: Apartment                           Additional Comments: Needs continued assessment. Pt highly distracted and unable to provide full report.       Prior Functioning/Environment Level of Independence: Independent                 OT Problem List: Decreased strength;Decreased activity tolerance;Impaired balance (sitting and/or standing);Decreased safety awareness;Decreased knowledge of use of DME or AE;Decreased knowledge of precautions;Pain;Impaired UE functional use      OT Treatment/Interventions: Self-care/ADL training;Therapeutic exercise;Energy conservation;DME and/or AE instruction;Therapeutic activities;Cognitive remediation/compensation;Patient/family education;Balance training    OT Goals(Current goals can be found in the care plan section) Acute Rehab OT Goals Patient Stated Goal: to return home and get some sleep OT Goal Formulation: With patient Time For Goal Achievement: 12/09/17 Potential to Achieve Goals: Good  OT Frequency: Min 3X/week   Barriers to D/C:            Co-evaluation              AM-PAC PT "6 Clicks" Daily Activity     Outcome Measure Help from another person  eating meals?: A Little Help from another person taking care of personal grooming?: A Little Help from another person toileting, which includes using toliet, bedpan, or urinal?: A Little Help from another person bathing (including washing, rinsing, drying)?: A Little Help from another person to put on and taking off regular upper body clothing?: A Little Help from another person to put on and taking off regular lower body clothing?: A Little 6 Click Score: 18   End of Session Equipment Utilized During Treatment: Other (comment)(L shoulder sling) Nurse Communication: Mobility status  Activity Tolerance: Patient tolerated treatment well Patient left: with call bell/phone within reach;in chair  OT Visit Diagnosis: Other abnormalities of gait and mobility (R26.89);Pain Pain - Right/Left: Left Pain - part of body: Shoulder                Time: 8421-0312 OT Time Calculation (min): 30 min Charges:  OT General Charges $OT Visit: 1 Visit OT Evaluation $OT Eval Moderate Complexity: 1 Mod OT Treatments $Therapeutic Exercise: 8-22 mins G-Codes:     Norman Herrlich, MS OTR/L  Pager: [place] A Malarie Tappen 11/25/2017, 3:48 PM

## 2017-11-25 NOTE — Progress Notes (Signed)
[facility] for Infectious Disease  Date of Admission:  11/24/2017   Total days of antibiotics 2         ASSESSMENT: Jonathan Meadows is a 47 y.o. male with history of untreated schizophrenia, chronic RLE lymphedema of uncertain etiology, and recent septic arthritis of L shoulder (10/27/2017) s/p arthroscopic debridement who presented 5/28 for further debridement after repeat MRI showed new abscess below AC, septic tenosynovitis, myositis, and concern for osteomyelitis of humeral head and glenoid. He is s/p arthroscopic debridement 5/29. Source unclear at this time, ?RLE cellulitis which he was also treated for in April admission. Gram stain negative.   Wound cx: no growth at 24 hours     PLAN: 1. Septic arthritis of L shoulder and possible osteomyelitis of humeral head and glenoid: Recommend continuing current regimen with vancomycin and ceftriaxone. Unfortunately, he is not a good candidate for long term IV antibiotic therapy given underlying, untreated psychiatric illness. Will follow up culture data tomorrow and develop an optimal oral antibiotic regimen prior to discharge.    Active Problems:   Infection of shoulder ([place])   Scheduled Meds: . docusate sodium  100 mg Oral BID  . furosemide  20 mg Oral Daily  . gabapentin  300 mg Oral TID  . traMADol  50 mg Oral Q6H   Continuous Infusions: . cefTRIAXone (ROCEPHIN)  IV Stopped (11/24/17 1805)  . lactated ringers Stopped (11/24/17 1730)  . methocarbamol (ROBAXIN)  IV    . vancomycin Stopped (11/25/17 0909)   PRN Meds:.acetaminophen, ALPRAZolam, HYDROmorphone (DILAUDID) injection, menthol-cetylpyridinium **OR** phenol, methocarbamol **OR** methocarbamol (ROBAXIN)  IV, metoCLOPramide **OR** metoCLOPramide (REGLAN) injection, ondansetron **OR** ondansetron (ZOFRAN) IV, oxyCODONE   SUBJECTIVE: Patient unable to sleep overnight and walking into other patient's rooms. States L shoulder is achy and unable to sleep. Asking for  sleeping and pain medication. Denied fever and chills.   Review of Systems: Review of Systems  Constitutional: Negative for chills, fever and malaise/fatigue.  Respiratory: Negative for shortness of breath.   Cardiovascular: Negative for chest pain and palpitations.  Gastrointestinal: Negative for diarrhea.  Psychiatric/Behavioral: The patient has insomnia.     Allergies  Allergen Reactions  . Pork-Derived Products Nausea And Vomiting    OBJECTIVE: Vitals:   11/24/17 1615 11/24/17 1653 11/24/17 1953 11/25/17 0014  BP:  123/90 (!) 132/92 130/76  Pulse: 67 84 90 76  Resp: (!) 21  19 17   Temp:   98 F (36.7 C) 97.9 F (36.6 C)  TempSrc:   Oral Oral  SpO2: 93% 100% 100% 100%  Weight:      Height:       Body mass index is 26.39 kg/m.  Physical Exam  Constitutional: He is oriented to person, place, and time. He appears well-developed and well-nourished. No distress.  Walking in the hallway when seen   Cardiovascular: Normal rate, regular rhythm and normal heart sounds. Exam reveals no gallop and no friction rub.  No murmur heard. Pulmonary/Chest: Effort normal and breath sounds normal. No respiratory distress.  Abdominal: Soft. Bowel sounds are normal. There is no tenderness.  Musculoskeletal:  L shoulder incisions remain bandaged with dressing c/d/i. No warmth or erythema noted.   Neurological: He is alert and oriented to person, place, and time.    Lab Results Lab Results  Component Value Date   WBC 8.7 11/25/2017   HGB 10.9 (L) 11/25/2017   HCT 33.3 (L) 11/25/2017   MCV 92.2 11/25/2017   PLT 272  11/25/2017    Lab Results  Component Value Date   CREATININE 1.02 11/25/2017   BUN 12 11/25/2017   NA 137 11/25/2017   K 3.7 11/25/2017   CL 102 11/25/2017   CO2 24 11/25/2017    Lab Results  Component Value Date   ALT 16 10/27/2017   AST 16 10/27/2017   ALKPHOS 69 10/08/2017   BILITOT 0.3 10/27/2017     Microbiology: Recent Results (from the past 240  hour(s))  Aerobic/Anaerobic Culture (surgical/deep wound)     Status: None (Preliminary result)   Collection Time: 11/24/17 12:18 PM  Result Value Ref Range Status   Specimen Description SHOULDER LEFT  Final   Special Requests SPEC ON SWABS  Final   Gram Stain NO WBC SEEN NO ORGANISMS SEEN   Final   Culture   Final    NO GROWTH < 24 HOURS Performed at [facility], [place] [address], [place], [place] [address]    Report Status PENDING  Incomplete  Aerobic/Anaerobic Culture (surgical/deep wound)     Status: None (Preliminary result)   Collection Time: 11/24/17 12:52 PM  Result Value Ref Range Status   Specimen Description SHOULDER LEFT SUB ACROMIAL SPACE  Final   Special Requests NONE  Final   Gram Stain   Final    RARE WBC PRESENT, PREDOMINANTLY PMN NO ORGANISMS SEEN    Culture   Final    NO GROWTH < 24 HOURS Performed at [facility], [place] [address], [place], [place] [address]    Report Status PENDING  Incomplete    Welford Roche, [facility] [place] [phone] pager   [phone] cell 11/25/2017, 1:08 PM

## 2017-11-26 ENCOUNTER — Other Ambulatory Visit: Payer: Self-pay | Admitting: Internal Medicine

## 2017-11-26 DIAGNOSIS — Z8659 Personal history of other mental and behavioral disorders: Secondary | ICD-10-CM

## 2017-11-26 DIAGNOSIS — F209 Schizophrenia, unspecified: Secondary | ICD-10-CM

## 2017-11-26 LAB — BASIC METABOLIC PANEL
Anion gap: 8 (ref 5–15)
BUN: 10 mg/dL (ref 6–20)
CHLORIDE: 107 mmol/L (ref 101–111)
CO2: 25 mmol/L (ref 22–32)
Calcium: 8.5 mg/dL — ABNORMAL LOW (ref 8.9–10.3)
Creatinine, Ser: 1.11 mg/dL (ref 0.61–1.24)
GFR calc Af Amer: >60 mL/min (ref >60)
GFR calc non Af Amer: >60 mL/min (ref >60)
Glucose, Bld: 115 mg/dL — ABNORMAL HIGH (ref 65–99)
POTASSIUM: 3.7 mmol/L (ref 3.5–5.1)
Sodium: 140 mmol/L (ref 135–145)

## 2017-11-26 MED ORDER — CIPROFLOXACIN HCL 500 MG PO TABS: 500.0000 mg | ORAL_TABLET | Freq: Two times a day (BID) | ORAL | 0 refills | Status: DC

## 2017-11-26 MED ORDER — DOCUSATE SODIUM 100 MG PO CAPS: 100.0000 mg | ORAL_CAPSULE | Freq: Two times a day (BID) | ORAL | 0 refills | Status: DC

## 2017-11-26 MED ORDER — OXYCODONE-ACETAMINOPHEN 10-325 MG PO TABS: 1.0000 | ORAL_TABLET | Freq: Four times a day (QID) | ORAL | 0 refills | Status: DC | PRN

## 2017-11-26 MED ORDER — METHOCARBAMOL 500 MG PO TABS: 500.0000 mg | ORAL_TABLET | Freq: Four times a day (QID) | ORAL | 0 refills | Status: DC | PRN

## 2017-11-26 MED ORDER — MUSCLE RUB 10-15 % EX CREA: 1.0000 "application " | TOPICAL_CREAM | CUTANEOUS | 0 refills | Status: DC | PRN

## 2017-11-26 MED ORDER — TRAMADOL HCL 50 MG PO TABS: 50.0000 mg | ORAL_TABLET | Freq: Four times a day (QID) | ORAL | 0 refills | Status: DC

## 2017-11-26 MED FILL — CIPROFLOXACIN HCL 750 MG TA: 750 | 30 days supply | Qty: 60 | Fill #0

## 2017-11-26 MED FILL — METHOCARBAMOL 500 MG TABS: 500 | 8 days supply | Qty: 30 | Fill #0

## 2017-11-26 MED FILL — LINEZOLID 600 MG TAB: 600 | 30 days supply | Qty: 60 | Fill #0

## 2017-11-26 MED FILL — traMADol HCL 50 MG TABS: 50 | 8 days supply | Qty: 30 | Fill #0

## 2017-11-26 NOTE — Progress Notes (Signed)
Discharge instructions completed with pt.  Pt verbalized understanding of the information.  Pt denies chest pain, shortness of breath, dizziness, lightheadedness, and n/v.  Pt alert and oriented x4.  Pt waiting on cab for transportation home.

## 2017-11-26 NOTE — Progress Notes (Signed)
Occupational Therapy Treatment Patient Details Name: Jonathan Meadows MRN: [id number] DOB: [birth year] Today's Date: 11/26/2017    History of present illness Pt is a 47 y.o. male s/p L shoulder arthroscopy, open distal clavicle excision, extensive intra-articular and extra-articular debridement with placement of antibiotic beads. Pt had arthroscopic debridement 4/14 to treat infection. Repeat MRI revealing persistent infection and thus pt was taken for further surgical intervention. PMH significant for anemia, cellulitis, homelessness, lymphedema of leg, paranoid schizophrenia, and septic joint of L shoulder region.   OT comments  This 47 yo male admitted and underwent above seen today to address sling wearing, ADL and LUE exercises. He will benefit from continued acute OT with follow up at OP.   Follow Up Recommendations  Supervision/Assistance - 24 hour;Outpatient OT    Equipment Recommendations  3 in 1 bedside commode       Precautions / Restrictions Precautions Precautions: Fall;Shoulder Type of Shoulder Precautions: sling for comfort, NWB L UE, AROM/PROM L shoulder (forward flexion 0-140, abduction 0-60, external rotation 0-30), AROM elbow/wrist/hand Shoulder Interventions: Shoulder sling/immobilizer;For comfort Precaution Comments: Reviewed precautions in detail with handout provided.  Restrictions Weight Bearing Restrictions: Yes LUE Weight Bearing: Non weight bearing       Mobility Bed Mobility               General bed mobility comments: OOB ambulating on my arrival.   Transfers Overall transfer level: Independent                        ADL either performed or assessed with clinical judgement   ADL                                         General ADL Comments: Pt able to tell me that he is suppose to put LUE in button up shirt first, then RUE. When asked about taking shirt off he reported same way--I had to remind him that it is the  opposite of putting on shirt. He was able to show me how therapist yesterday instructed him to wash under each arm. He was able to doff sling by himself, but needed A to don sling (to get shoulder strap where he could reach it). Pt up and ambulating in room and hallways by himself pushing IV pole.     Vision Baseline Vision/History: No visual deficits Patient Visual Report: No change from baseline            Cognition Arousal/Alertness: Awake/alert Behavior During Therapy: WFL for tasks assessed/performed Overall Cognitive Status: No family/caregiver present to determine baseline cognitive functioning                                 General Comments: Pt with history of paranouid schizophrenia. Pt making inappropriate comments.         Exercises Other Exercises Other Exercises: pt completed 10 reps of shoulder flexion (supine AAROM using opposite arm to A)--pt able to get ~30 degrees, shoulder abduction in standing (pt able to get ~20 degrees), shoulder external rotation--(pt able to get ~to neutral), elbow flex/ext in standing, forearm supination/pronation in standing,  wrist flex/ext in sitting, and composite finger flex/ext in standing.  Pt able to state he is to do these 3 times a day 10 reps of each. Pt with handoutt from yesterday,  he was able to look at them and perform exercises.           Pertinent Vitals/ Pain       Pain Assessment: 0-10 Pain Score: 4  Pain Location: L shoulder with exercises Pain Descriptors / Indicators: Aching;Operative site guarding;Sore Pain Intervention(s): Limited activity within patient's tolerance;Monitored during session;Repositioned;Ice applied         Frequency  Min 3X/week        Progress Toward Goals  OT Goals(current goals can now be found in the care plan section)  Progress towards OT goals: Progressing toward goals     Plan Discharge plan remains appropriate       AM-PAC PT "6 Clicks" Daily Activity      Outcome Measure   Help from another person eating meals?: None Help from another person taking care of personal grooming?: None Help from another person toileting, which includes using toliet, bedpan, or urinal?: None Help from another person bathing (including washing, rinsing, drying)?: None Help from another person to put on and taking off regular upper body clothing?: A Little Help from another person to put on and taking off regular lower body clothing?: None 6 Click Score: 23    End of Session Equipment Utilized During Treatment: (l shoulder sling)  OT Visit Diagnosis: Pain Pain - Right/Left: Left Pain - part of body: Shoulder   Activity Tolerance Patient tolerated treatment well   Patient Left (sitting in recliner drinking coffee)           Time: 8453-6468 OT Time Calculation (min): 51 min  Charges: OT General Charges $OT Visit: 1 Visit OT Treatments $Self Care/Home Management : 8-22 mins $Therapeutic Exercise: 23-37 mins [place], OTR/L [phone] 11/26/2017

## 2017-11-26 NOTE — Discharge Instructions (Signed)
Bone and Joint Infections, Adult Bone infections (osteomyelitis) and joint infections (septic arthritis) occur when bacteria or other germs get inside a bone or a joint. This can happen if you have an infection in another part of your body that spreads through your blood. Germs from your skin or from outside of your body can also cause this type of infection if you have a wound or a broken bone (fracture) that breaks the skin. Anyone can get a bone infection or joint infection. You may be more likely to get this type of infection if you have a condition, such as diabetes, that lowers your ability to fight infection or increases your chances of getting an infection. Bone and joint infections can cause damage, and they can spread to other areas of your body. They need to be treated quickly. What are the causes? Most bone and joint infections are caused by bacteria. They can also be caused by other germs, such as viruses and funguses. What increases the risk? This condition is more likely to develop in:  People who recently had surgery, especially bone or joint surgery.  People who have a long-term (chronic) disease, such as: ? HIV (human immunodeficiency virus). ? Diabetes. ? Rheumatoid arthritis. ? Sickle cell anemia.  Elderly people.  People who take medicines that block or weaken the bodys defense system (immune system).  People who have a condition that reduces their blood flow.  People who are on kidney dialysis.  People who have an artificial joint.  People who have had a joint or bone repaired with plates or screws (surgical hardware).  People who use or abuse IV drugs.  People who have had trauma, such as stepping on a nail.  What are the signs or symptoms? Symptoms vary depending on the type and location of your infection. Common symptoms of bone and joint infections include:  Fever and chills.  Redness and warmth.  Swelling.  Pain and stiffness.  Drainage of fluid  or pus near the infection.  Weight loss and fatigue.  Decreased ability to use a hand or foot.  How is this diagnosed? This condition may be diagnosed based on symptoms, medical history, a physical exam, and diagnostic tests. Tests can help to identify the cause of the infection. You may have various tests, such as:  A sample of tissue, fluid, or blood taken to be examined under a microscope.  A procedure to remove fluid from the infected joint with a needle (joint aspiration) for testing in a lab.  Pus or discharge swabbed from a wound for testing to identify germs and to determine what type of medicine will kill them (culture and sensitivity).  Blood tests to look for evidence of infection and inflammation (biomarkers).  Imaging studies to determine how severe the bone or joint infection is. These may include: ? X-rays. ? CT scan. ? MRI. ? Bone scan.  How is this treated? Treatment depends on the cause and type of infection. Antibiotic medicines are usually the first treatment for a bone or joint infection. Treatment with antibiotics may include:  Getting IV antibiotics. This may be done in a hospital at first. You may have to continue IV antibiotics at home for several weeks. You may also have to take antibiotics by mouth for several weeks after that.  Taking more than one kind of antibiotic. Treatment may start with a type of antibiotic that works against many different bacteria (broad spectrumantibiotics). IV antibiotics may be changed if tests show that another  type may work better.  Other treatments may include:  Draining fluid from the joint by placing a needle into it (aspiration).  Surgery to remove: ? Dead or dying tissue from a bone or joint. ? An infected artificial joint. ? Infected plates or screws that were used to repair a broken bone.  Follow these instructions at home:  Take medicines only as directed by your health care provider.  Take your antibiotic  medicine as directed by your health care provider. Finish the antibiotic even if you start to feel better.  Follow instructions from your health care provider about how to take IV antibiotics at home.  Ask your health care provider if you have any restrictions on your activities.  Keep all follow-up visits as directed by your health care provider. This is important. Contact a health care provider if:  You have a fever or chills.  You have redness, warmth, pain, or swelling that returns after treatment. Get help right away if:  You have rapid breathing or you have trouble breathing.  You have chest pain.  You cannot drink fluids or make urine.  The affected arm or leg swells, changes color, or turns blue. This information is not intended to replace advice given to you by your health care provider. Make sure you discuss any questions you have with your health care provider. Document Released: 06/16/2005 Document Revised: 11/22/2015 Document Reviewed: 06/14/2014 Elsevier Interactive Patient Education  2018 [employer].   Septic Arthritis Septic arthritis is inflammation of a joint that results from an infection. The infection is caused by germs (bacteria) that enter the joint. Septic arthritis often starts with a painful joint that suddenly gets hot and red. The knee and hip joints are most often affected, but other joints may become infected. Usually, just one joint is infected. What are the causes? Often, septic arthritis is caused by Staphylococcus bacteria. Other bacteria that may lead to the condition include those that cause:  Fungal infections.  Sexually transmitted infections (STIs).  Tuberculosis.  Infection can spread to your joint directly if you:  Have an infection somewhere else in your body that is carried to the joint by your blood. This is the most common cause of septic arthritis.  Have an open wound near a joint.  Have a needle put into your joint.  Have  joint surgery.  What increases the risk? You may have a higher risk for septic arthritis if you:  Have an artificial joint.  Have a blood infection.  Had a recent joint surgery or procedure.  Had a recent joint injury.  Have diabetes.  Have arthritis.  Have a condition that weakens your bodys defense system (immune system).  Use intravenous (IV) drugs.  Have gonorrhea.  What are the signs or symptoms? Signs and symptoms of septic arthritis may include:  Swelling at the joint.  Severe pain in the joint.  Redness and warmth in the joint.  Being unable to move the joint.  Fever and chills.  How is this diagnosed? Your health care provider may do a physical exam. You may also have tests to confirm the diagnosis. These may include:  Blood tests.  Fluid removal from your joint to look for signs of infection (synovial fluid analysis).  X-ray.  How is this treated? You may need to have fluid drained from the joint every day for several days to relieve pain. You may also start treatment with antibiotic medicine. The antibiotics may be given by IV or mouth.  Follow these instructions at home:  If you were prescribed an antibiotic medicine, finish it all even if you start to feel better.  Take medicines only as directed by your health care provider.  Use a cool compress on your joint to relieve pain.  Rest your joint until the pain and swelling go away.  Elevate the affected joint when resting.  After the pain and swelling are gone, do light exercises as directed by your health care provider.  Keep all follow-up visits as directed by your health care provider. This is important. Contact a health care provider if:  You have pain that is not controlled with medicine.  You have a fever. Get help right away if: You have signs of worsening infection in your joint. Watch for:  Very bad pain.  Redness.  Warmth.  Swelling.  This information is not intended  to replace advice given to you by your health care provider. Make sure you discuss any questions you have with your health care provider. Document Released: 09/06/2002 Document Revised: 02/09/2016 Document Reviewed: 08/19/2013 Elsevier Interactive Patient Education  [employer].

## 2017-11-26 NOTE — Progress Notes (Signed)
Subjective: Pt stable - pain ok   Objective: Vital signs in last 24 hours: Temp:  [97.9 F (36.6 C)-98.3 F (36.8 C)] 97.9 F (36.6 C) (05/30 0423) Pulse Rate:  [100-103] 103 (05/30 0423) Resp:  [18-20] 20 (05/30 0423) BP: (131-142)/(85-99) 131/96 (05/30 0809) SpO2:  [100 %] 100 % (05/30 0423)  Intake/Output from previous day: 05/29 0701 - 05/30 0700 In: 1940 [P.O.:1440; IV Piggyback:500] Out: 950 [Urine:950] Intake/Output this shift: No intake/output data recorded.  Exam:  No cellulitis present  Labs: Recent Labs    11/24/17 0836 11/25/17 0643  HGB 12.1* 10.9*   Recent Labs    11/24/17 0836 11/25/17 0643  WBC 5.3 8.7  RBC 4.01* 3.61*  HCT 37.5* 33.3*  PLT 287 272   Recent Labs    11/25/17 0643 11/26/17 0403  NA 137 140  K 3.7 3.7  CL 102 107  CO2 24 25  BUN 12 10  CREATININE 1.02 1.11  GLUCOSE 104* 115*  CALCIUM 9.3 8.5*   No results for input(s): LABPT, INR in the last 72 hours.  Assessment/Plan: Dc today on oral abx   G Alphonzo Severance 11/26/2017, 8:24 AM

## 2017-11-26 NOTE — Progress Notes (Addendum)
ID PROGRESS NOTE   Cultures remain no growth to date  For discharge, I have checked with [facility] OP pharmacy  For abtx only  Will give cipro 750mg  PO  BID (which has been called in) x 30 d plus linezolid 600mg  PO BID x 30d (also called in)   Will arrange follow up with Dr Tommy Medal to see in 3-4 wk

## 2017-11-26 NOTE — Care Management (Addendum)
Patient will discharge home today, medications given to patient through pharmacy. Patient has scheduled follow up appointments with Dr.Vann Dam and the [facility] arranged by Infectious disease team.  [facility] will be taking patient to [place], [place], [place].

## 2017-11-26 NOTE — Progress Notes (Signed)
[facility] for Infectious Disease  Date of Admission:  11/24/2017   Total days of antibiotics 3        Vancomycin and Rocephin          ASSESSMENT: Jonathan Meadows a 46 y.o.malewith history of untreated schizophrenia, chronic RLE lymphedema of uncertain etiology, and recent septicarthritis ofL shoulder (10/27/2017) s/p arthroscopic debridement who presented 5/82for further debridement after repeat MRI showed new abscess below AC, septic tenosynovitis, myositis, and concern for osteomyelitis of humeral head and glenoid.He is s/p arthroscopic debridement 5/29. Source unclear at this time, ?RLE cellulitis which he was also treated for in April admission. Gram stain negative and culture without growth. On vancomycin and ceftriaxone with plan for discharge today.   Wound cx: no growth to date   PLAN: 1. 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 Plan to transition to oral therapy with linezolid and ciprofloxacin x 4 weeks with follow up in [place] clinic with Dr. Tommy Medal in 3-4 weeks. Will receive medications in room prior to discharge. Unable to discharge directly to short stay for oritavancin infusion due to unavailability. Next available appointment 6/3, will discuss further with patient.    Active Problems:   Infection of shoulder ([place])   Scheduled Meds: . docusate sodium  100 mg Oral BID  . furosemide  20 mg Oral Daily  . gabapentin  300 mg Oral TID  . traMADol  50 mg Oral Q6H   Continuous Infusions: . cefTRIAXone (ROCEPHIN)  IV Stopped (11/25/17 1847)  . methocarbamol (ROBAXIN)  IV    . vancomycin Stopped (11/26/17 0808)   PRN Meds:.acetaminophen, ALPRAZolam, HYDROmorphone (DILAUDID) injection, menthol-cetylpyridinium **OR** phenol, methocarbamol **OR** methocarbamol (ROBAXIN)  IV, metoCLOPramide **OR** metoCLOPramide (REGLAN) injection, MUSCLE RUB, ondansetron **OR** ondansetron  (ZOFRAN) IV, oxyCODONE   SUBJECTIVE: No acute events overnight. Patient continues to wander around the unit and going into other patient's rooms. Reports L shoulder is "achy" and ice helps. Asking for sleeping pills. Denies fever, chills, and diarrhea.    Review of Systems: Review of Systems  Constitutional: Negative for chills, fever and malaise/fatigue.  Respiratory: Negative for shortness of breath.   Cardiovascular: Negative for chest pain.  Gastrointestinal: Negative for abdominal pain, diarrhea, nausea and vomiting.  Musculoskeletal: Positive for joint pain.    Allergies  Allergen Reactions  . Pork-Derived Products Nausea And Vomiting    OBJECTIVE: Vitals:   11/25/17 1324 11/25/17 2014 11/26/17 0423 11/26/17 0809  BP: (!) 139/99 (!) 142/85 (!) 131/92 (!) 131/96  Pulse: (!) 103 100 (!) 103   Resp: 18 20 20    Temp: 98.3 F (36.8 C) 98 F (36.7 C) 97.9 F (36.6 C)   TempSrc: Oral Oral Oral   SpO2: 100% 100% 100%   Weight:      Height:       Body mass index is 26.39 kg/m.  Physical Exam  Constitutional: He is oriented to person, place, and time. He appears well-developed and well-nourished. No distress.  Patient seen walking in the hallway   Cardiovascular: Normal rate, regular rhythm and normal heart sounds. Exam reveals no gallop and no friction rub.  No murmur heard. Pulmonary/Chest: Effort normal and breath sounds normal. No respiratory distress.  Musculoskeletal:  L shoulder remains warmth to the touch. Bandages in place as well as ice pack. ROM remains decreased.   Neurological: He is alert and oriented to person, place, and time.  Lab Results Lab Results  Component Value Date   WBC 8.7 11/25/2017   HGB 10.9 (L) 11/25/2017   HCT 33.3 (L) 11/25/2017   MCV 92.2 11/25/2017   PLT 272 11/25/2017    Lab Results  Component Value Date   CREATININE 1.11 11/26/2017   BUN 10 11/26/2017   NA 140 11/26/2017   K 3.7 11/26/2017   CL 107 11/26/2017   CO2 25  11/26/2017    Lab Results  Component Value Date   ALT 16 10/27/2017   AST 16 10/27/2017   ALKPHOS 69 10/08/2017   BILITOT 0.3 10/27/2017     Microbiology: Recent Results (from the past 240 hour(s))  Aerobic/Anaerobic Culture (surgical/deep wound)     Status: None (Preliminary result)   Collection Time: 11/24/17 12:18 PM  Result Value Ref Range Status   Specimen Description SHOULDER LEFT  Final   Special Requests SPEC ON SWABS  Final   Gram Stain NO WBC SEEN NO ORGANISMS SEEN   Final   Culture   Final    NO GROWTH < 24 HOURS Performed at [facility], [place] [address] [place], [place] [address]    Report Status PENDING  Incomplete  Aerobic/Anaerobic Culture (surgical/deep wound)     Status: None (Preliminary result)   Collection Time: 11/24/17 12:52 PM  Result Value Ref Range Status   Specimen Description SHOULDER LEFT SUB ACROMIAL SPACE  Final   Special Requests NONE  Final   Gram Stain   Final    RARE WBC PRESENT, PREDOMINANTLY PMN NO ORGANISMS SEEN    Culture   Final    NO GROWTH < 24 HOURS Performed at [facility], [place] [address], [place], [place] [address]    Report Status PENDING  Incomplete    Welford Roche, [place] [facility] [place] [phone] pager   [phone] cell 11/26/2017, 10:52 AM

## 2017-11-27 ENCOUNTER — Telehealth (INDEPENDENT_AMBULATORY_CARE_PROVIDER_SITE_OTHER): Payer: Self-pay | Admitting: Orthopedic Surgery

## 2017-11-27 MED FILL — OXYCODONE-ACETAMINOPHEN 10-: 10-325 | 7 days supply | Qty: 30 | Fill #0

## 2017-11-27 NOTE — Telephone Encounter (Signed)
Susan/OP Pharmacy called and needed Lauren to call her asap min regards to script  [phone]

## 2017-11-27 NOTE — Telephone Encounter (Signed)
Pharmacy was calling because patients insurance denied oxy without prior auth.  Patient apparently was very inappropriate at the pharmacy, security had to be called due to his behavior.  I was able to get medication authorized. The pharmacy also wanted to make sure that you were aware that he picked up his antibiotic but acted like he did not even know what was going on or why he should be taking it.

## 2017-11-28 NOTE — Telephone Encounter (Signed)
Thx for all you do

## 2017-11-29 LAB — AEROBIC/ANAEROBIC CULTURE W GRAM STAIN (SURGICAL/DEEP WOUND): Culture: NO GROWTH

## 2017-11-29 LAB — AEROBIC/ANAEROBIC CULTURE (SURGICAL/DEEP WOUND)
CULTURE: NO GROWTH
GRAM STAIN: NONE SEEN

## 2017-12-02 ENCOUNTER — Encounter (INDEPENDENT_AMBULATORY_CARE_PROVIDER_SITE_OTHER): Payer: Self-pay | Admitting: Orthopedic Surgery

## 2017-12-02 ENCOUNTER — Ambulatory Visit (INDEPENDENT_AMBULATORY_CARE_PROVIDER_SITE_OTHER): Payer: Medicaid Other | Admitting: Orthopedic Surgery

## 2017-12-02 DIAGNOSIS — M7502 Adhesive capsulitis of left shoulder: Secondary | ICD-10-CM

## 2017-12-02 MED ORDER — METHOCARBAMOL 500 MG PO TABS: 500.0000 mg | ORAL_TABLET | Freq: Three times a day (TID) | ORAL | 0 refills | Status: DC | PRN

## 2017-12-02 MED ORDER — OXYCODONE-ACETAMINOPHEN 10-325 MG PO TABS: ORAL_TABLET | ORAL | 0 refills | Status: DC

## 2017-12-02 MED ORDER — MUSCLE RUB 10-15 % EX CREA: 1.0000 "application " | TOPICAL_CREAM | CUTANEOUS | 0 refills | Status: DC | PRN

## 2017-12-02 MED FILL — OXYCODONE-ACETAMINOPHEN 10-: 10-325 | 7 days supply | Qty: 42 | Fill #0

## 2017-12-06 ENCOUNTER — Emergency Department (HOSPITAL_COMMUNITY)
Admission: EM | Admit: 2017-12-06 | Discharge: 2017-12-06 | Disposition: A | Discharge status: Home / Self Care | Payer: Medicaid Other | Attending: Emergency Medicine | Admitting: Emergency Medicine

## 2017-12-06 ENCOUNTER — Encounter (HOSPITAL_COMMUNITY): Payer: Self-pay | Admitting: Emergency Medicine

## 2017-12-06 ENCOUNTER — Other Ambulatory Visit: Payer: Self-pay

## 2017-12-06 ENCOUNTER — Encounter (INDEPENDENT_AMBULATORY_CARE_PROVIDER_SITE_OTHER): Payer: Self-pay | Admitting: Orthopedic Surgery

## 2017-12-06 DIAGNOSIS — M25512 Pain in left shoulder: Secondary | ICD-10-CM | POA: Diagnosis not present

## 2017-12-06 DIAGNOSIS — Z79899 Other long term (current) drug therapy: Secondary | ICD-10-CM | POA: Insufficient documentation

## 2017-12-06 MED ORDER — ONDANSETRON 4 MG PO TBDP
4.0000 mg | ORAL_TABLET | Freq: Once | ORAL | Status: AC
Start: 2017-12-06 — End: 2017-12-06
  Administered 2017-12-06: 4 mg via ORAL
  Filled 2017-12-06: qty 1

## 2017-12-06 MED ORDER — ONDANSETRON 4 MG PO TBDP: 4.0000 mg | ORAL_TABLET | Freq: Three times a day (TID) | ORAL | 0 refills | Status: DC | PRN

## 2017-12-06 MED ORDER — IBUPROFEN 800 MG PO TABS
800.0000 mg | ORAL_TABLET | Freq: Once | ORAL | Status: AC
  Administered 2017-12-06: 800 mg via ORAL
  Filled 2017-12-06: qty 1

## 2017-12-06 NOTE — ED Notes (Signed)
Patient seen standing beside the TV in the room staring at the wall. This RN asked the patient if he needed something. Pt states "I'm just waiting here to see the doctor."

## 2017-12-06 NOTE — ED Triage Notes (Signed)
Pt. Stated, Ive had shoulder pain bad and Ive lost my medication for pain. Surgery in 2 weeks.

## 2017-12-06 NOTE — Progress Notes (Signed)
Post-Op Visit Note   Patient: Jonathan Meadows           Date of Birth: August 22, 1970           MRN: [id number] Visit Date: 12/02/2017 PCP: Patient, No Pcp Per   Assessment & Plan:  Chief Complaint:  Chief Complaint  Patient presents with  . Left Shoulder - Routine Post Op   Visit Diagnoses:  1. Adhesive capsulitis of left shoulder     Plan: [name] is a patient who is now about 10 days out left shoulder arthroscopy clavicle excision intra-and extra articular debridement for infection.  Cultures negative today.  He should be on oral antibiotics.  He states that probably taking them.  He wants to start physical therapy.  Examination the incisions look intact.  Passive range of motion is reasonable.  I encouraged him and admonished him to take his antibiotics so the shoulder does not remain infected.  I refilled his oxycodone and Robaxin and started him in physical therapy.  Really want him to do range of motion exercises and strengthening exercises.  The shoulder was pretty stiff at the time of surgery.  Please also continue to follow-up with infectious disease.  I will see him back for clinical recheck on the shoulder.  In 4 weeks.  Follow-Up Instructions: Return in about 1 month (around 12/30/2017).   Orders:  Orders Placed This Encounter  Procedures  . Ambulatory referral to Physical Therapy   Meds ordered this encounter  Medications  . oxyCODONE-acetaminophen (PERCOCET) 10-325 MG tablet    Sig: 1 po q 4-6hrs prn pain    Dispense:  42 tablet    Refill:  0  . methocarbamol (ROBAXIN) 500 MG tablet    Sig: Take 1 tablet (500 mg total) by mouth every 8 (eight) hours as needed for muscle spasms.    Dispense:  30 tablet    Refill:  0  . Menthol-Methyl Salicylate (MUSCLE RUB) 10-15 % CREA    Sig: Apply 1 application topically as needed for muscle pain.    Dispense:  1 Tube    Refill:  0    Imaging: No results found.  PMFS History: Patient Active Problem List   Diagnosis Date Noted    . History of schizophrenia   . Infection of shoulder ([place]) 11/24/2017  . Septic joint of left shoulder region ([place]) 10/27/2017  . Adhesive capsulitis of left shoulder   . Impingement syndrome of left shoulder   . Effusion of left shoulder joint 10/10/2017  . Chest pain 10/08/2017  . Cellulitis 10/08/2017  . Sepsis with cutaneous manifestations ([place]) 07/02/2017  . GAD (generalized anxiety disorder) 06/07/2017  . Financial difficulties 06/07/2017  . Lymphedema 06/07/2017  . Gastric outlet obstruction 10/31/2016  . Chronic pain associated with significant psychosocial dysfunction 06/04/2016  . Schizophrenia, chronic condition with acute exacerbation ([place]) 09/11/2015  . Hypokalemia 01/15/2015  . Hypertension 12/03/2014  . Polysubstance abuse ([place]) 12/03/2014  . Schizoaffective disorder, bipolar type ([place]) 12/03/2014  . Back pain 08/11/2013  . Cellulitis and abscess of finger 10/10/2012  . Swollen leg 10/10/2012  . Anemia 10/10/2012   Past Medical History:  Diagnosis Date  . Anemia   . Cellulitis   . Homelessness   . Lymphedema of leg   . Paranoid schizophrenia ([place])    pt denies  . Septic joint of left shoulder region [facility]) 10/27/2017    Family History  Problem Relation Age of Onset  . CAD Unknown  Neg HX  . Hypertension Unknown        Neg Hx  . Diabetes Unknown        Neg Hx  . Cancer Unknown        Neg Hx  . Lupus Mother     Past Surgical History:  Procedure Laterality Date  . SHOULDER ARTHROSCOPY Left 10/11/2017   Procedure: ARTHROSCOPY SHOULDER, LEFT;  Surgeon: Newt Minion, MD;  Location: [facility];  Service: Orthopedics;  Laterality: Left;  . SHOULDER ARTHROSCOPY WITH SUBACROMIAL DECOMPRESSION, ROTATOR CUFF REPAIR AND BICEP TENDON REPAIR Left 11/24/2017   Procedure: LEFT SHOULDER ARTHROSCOPY, OPEN DISTAL CLAVICLE EXCISION, EXTENSIVE INTRA-ARTICULAR AND EXTRA-ARTICULAR DEBRIDEMENT;  Surgeon: [name], MD;  Location: [facility];  Service: Orthopedics;   Laterality: Left;   Social History   Occupational History  . Not on file  Tobacco Use  . Smoking status: Never Smoker  . Smokeless tobacco: Never Used  Substance and Sexual Activity  . Alcohol use: No  . Drug use: No  . Sexual activity: Not on file

## 2017-12-06 NOTE — ED Provider Notes (Signed)
[facility] EMERGENCY DEPARTMENT Provider Note   CSN: [id number] Arrival date & time: 12/06/17  1002     History   Chief Complaint Chief Complaint  Patient presents with  . Shoulder Pain    HPI Jonathan Meadows is a 47 y.o. male.  Pt comes in with pain in his shoulder. He states that he lost his pain medication. He states that he had surgery with Dr. Marlou Sa to clean out his shoulder a couple of days ago and he lost his medication. He states that he is also a little nauseated and would like something for that that     Past Medical History:  Diagnosis Date  . Anemia   . Cellulitis   . Homelessness   . Lymphedema of leg   . Paranoid schizophrenia ([place])    pt denies  . Septic joint of left shoulder region [facility]) 10/27/2017    Patient Active Problem List   Diagnosis Date Noted  . History of schizophrenia   . Infection of shoulder ([place]) 11/24/2017  . Septic joint of left shoulder region ([place]) 10/27/2017  . Adhesive capsulitis of left shoulder   . Impingement syndrome of left shoulder   . Effusion of left shoulder joint 10/10/2017  . Chest pain 10/08/2017  . Cellulitis 10/08/2017  . Sepsis with cutaneous manifestations ([place]) 07/02/2017  . GAD (generalized anxiety disorder) 06/07/2017  . Financial difficulties 06/07/2017  . Lymphedema 06/07/2017  . Gastric outlet obstruction 10/31/2016  . Chronic pain associated with significant psychosocial dysfunction 06/04/2016  . Schizophrenia, chronic condition with acute exacerbation ([place]) 09/11/2015  . Hypokalemia 01/15/2015  . Hypertension 12/03/2014  . Polysubstance abuse ([place]) 12/03/2014  . Schizoaffective disorder, bipolar type ([place]) 12/03/2014  . Back pain 08/11/2013  . Cellulitis and abscess of finger 10/10/2012  . Swollen leg 10/10/2012  . Anemia 10/10/2012    Past Surgical History:  Procedure Laterality Date  . SHOULDER ARTHROSCOPY Left 10/11/2017   Procedure: ARTHROSCOPY SHOULDER, LEFT;  Surgeon: Newt Minion, MD;  Location: [place];  Service: Orthopedics;  Laterality: Left;  . SHOULDER ARTHROSCOPY WITH SUBACROMIAL DECOMPRESSION, ROTATOR CUFF REPAIR AND BICEP TENDON REPAIR Left 11/24/2017   Procedure: LEFT SHOULDER ARTHROSCOPY, OPEN DISTAL CLAVICLE EXCISION, EXTENSIVE INTRA-ARTICULAR AND EXTRA-ARTICULAR DEBRIDEMENT;  Surgeon: [name], MD;  Location: [facility];  Service: Orthopedics;  Laterality: Left;        Home Medications    Prior to Admission medications   Medication Sig Start Date End Date Taking? Authorizing Provider  ciprofloxacin (CIPRO) 500 MG tablet Take 1 tablet (500 mg total) by mouth 2 (two) times daily. 11/26/17   [name], MD  docusate sodium (COLACE) 100 MG capsule Take 1 capsule (100 mg total) by mouth 2 (two) times daily. 11/26/17   [name], MD  furosemide (LASIX) 20 MG tablet Take 20 mg by mouth daily.    [provider]  Menthol-Methyl Salicylate (MUSCLE RUB) 10-15 % CREA Apply 1 application topically as needed for muscle pain. 11/26/17   [name], MD  Menthol-Methyl Salicylate (MUSCLE RUB) 10-15 % CREA Apply 1 application topically as needed for muscle pain. 12/02/17   [name], MD  methocarbamol (ROBAXIN) 500 MG tablet Take 1 tablet (500 mg total) by mouth every 6 (six) hours as needed for muscle spasms. 11/26/17   [name], MD  methocarbamol (ROBAXIN) 500 MG tablet Take 1 tablet (500 mg total) by mouth every 8 (eight) hours as needed for muscle spasms. 12/02/17  [name], MD  oxyCODONE-acetaminophen [facility]) 10-325 MG tablet 1 po q 4-6hrs prn pain 12/02/17   [name], MD  traMADol (ULTRAM) 50 MG tablet Take 1 tablet (50 mg total) by mouth every 6 (six) hours. 11/26/17   [name], MD    Family History Family History  Problem Relation Age of Onset  . CAD Unknown        Neg HX  . Hypertension Unknown        Neg Hx  . Diabetes Unknown        Neg Hx  . Cancer Unknown          Neg Hx  . Lupus Mother     Social History Social History   Tobacco Use  . Smoking status: Never Smoker  . Smokeless tobacco: Never Used  Substance Use Topics  . Alcohol use: No  . Drug use: No     Allergies   Pork-derived products   Review of Systems Review of Systems  All other systems reviewed and are negative.    Physical Exam Updated Vital Signs BP (!) 139/111   Pulse 87   Temp 98.3 F (36.8 C) (Oral)   Resp 18   SpO2 100%   Physical Exam  Constitutional: He is oriented to person, place, and time. He appears well-developed and well-nourished.  Cardiovascular: Normal rate and regular rhythm.  Pulmonary/Chest: Effort normal and breath sounds normal.  Musculoskeletal:  Good rom of the left shoulder  Neurological: He is alert and oriented to person, place, and time.  Skin: Skin is warm and dry.  Steri strips over wound without any drainage or redness  Nursing note and vitals reviewed.    ED Treatments / Results  Labs (all labs ordered are listed, but only abnormal results are displayed) Labs Reviewed - No data to display  EKG None  Radiology No results found.  Procedures Procedures (including critical care time)  Medications Ordered in ED Medications  ibuprofen (ADVIL,MOTRIN) tablet 800 mg (has no administration in time range)  ondansetron (ZOFRAN-ODT) disintegrating tablet 4 mg (has no administration in time range)     Initial Impression / Assessment and Plan / ED Course  I have reviewed the triage vital signs and the nursing notes.  Pertinent labs & imaging results that were available during my care of the patient were reviewed by me and considered in my medical decision making (see chart for details).     Pt told that he needs to have bp rechecked. Told that he needs to follow up with Dr. Marlou Sa for pain medication. Will give some zofran  Final Clinical Impressions(s) / ED Diagnoses   Final diagnoses:  None    ED Discharge  Orders    None       Glendell Docker, NP 12/06/17 [place], MD 12/06/17 1811

## 2017-12-06 NOTE — ED Notes (Signed)
Pt complaining of nausea.   

## 2017-12-06 NOTE — ED Triage Notes (Signed)
Pt. Stated, I lost my pain medication.

## 2017-12-07 ENCOUNTER — Telehealth (INDEPENDENT_AMBULATORY_CARE_PROVIDER_SITE_OTHER): Payer: Self-pay

## 2017-12-07 NOTE — Telephone Encounter (Signed)
Advised pt of Message below    New Request   Pt is requesting Physical Therapy

## 2017-12-07 NOTE — Telephone Encounter (Signed)
Received notification from patients pharmacy [facility] that the oxy10/325 that was prescribed for patient on 06/05 stating that needed prior auth. I submitted online on [place] [employer] and PA was suspended pending further review. [facility] pharmacy s/w [facility] Penny and advised them of this. I also tried calling patient, the cell number listed, no answer, no VM to LM. The home number, male answered stating that patient did not live there.

## 2017-12-07 NOTE — Telephone Encounter (Signed)
Referral was put in on 12/02/17 when patient was seen. Will call him to discuss.

## 2017-12-07 NOTE — Telephone Encounter (Signed)
IC patient and advised about PT referral.

## 2017-12-08 NOTE — Discharge Summary (Signed)
Physician Discharge Summary  Patient ID: Jonathan Meadows MRN: [id number] DOB/AGE: 11-05-1970 47 y.o.  Admit date: 11/24/2017 Discharge date: 11/26/2017  Admission Diagnoses:  Active Problems:   Infection of shoulder ([place])   History of schizophrenia   Discharge Diagnoses:  Same  Surgeries: Procedure(s): LEFT SHOULDER ARTHROSCOPY, OPEN DISTAL CLAVICLE EXCISION, EXTENSIVE INTRA-ARTICULAR AND EXTRA-ARTICULAR DEBRIDEMENT on 11/24/2017   Consultants:   Discharged Condition: Stable  Hospital Course: Jonathan Meadows is an 47 y.o. male who was admitted 11/24/2017 with a chief complaint of recurrent left shoulder infection, and found to have a diagnosis of culture-negative recurrent left shoulder infection.  They were brought to the operating room on 11/24/2017 and underwent the above named procedures.  The patient tolerated the procedure well and was seen by physical and occupational therapy to begin shoulder range of motion exercises on postop day #1.  Infectious disease was also consulted and they left the recommendations for oral antibiotics which is the most likely scenario for this patient to be compliant with taking medication.  His pain was controlled at the time of discharge.  He will follow-up with me in a week for suture removal.  He is discharged home in good condition with pain medicine and appropriate oral antibiotics.  Antibiotics given:  Anti-infectives (From admission, onward)   Start     Dose/Rate Route Frequency Ordered Stop   11/26/17 0000  ciprofloxacin (CIPRO) 500 MG tablet     500 mg Oral 2 times daily 11/26/17 0831     11/25/17 0700  vancomycin (VANCOCIN) IVPB 1000 mg/200 mL premix  Status:  Discontinued     1,000 mg 200 mL/hr over 60 Minutes Intravenous Every 8 hours 11/24/17 1642 11/26/17 1527   11/24/17 1700  cefTRIAXone (ROCEPHIN) 2 g in sodium chloride 0.9 % 100 mL IVPB  Status:  Discontinued     2 g 200 mL/hr over 30 Minutes Intravenous Every 24 hours 11/24/17 1616 11/26/17  1527   11/24/17 1700  vancomycin (VANCOCIN) 1,250 mg in sodium chloride 0.9 % 250 mL IVPB     1,250 mg 166.7 mL/hr over 90 Minutes Intravenous  Once 11/24/17 1642 11/24/17 2356   11/24/17 0815  ceFAZolin (ANCEF) IVPB 2g/100 mL premix     2 g 200 mL/hr over 30 Minutes Intravenous On call to O.R. 11/24/17 0805 11/24/17 1325    .  Recent vital signs:  Vitals:   11/26/17 0423 11/26/17 0809  BP: (!) 131/92 (!) 131/96  Pulse: (!) 103   Resp: 20   Temp: 97.9 F (36.6 C)   SpO2: 100%     Recent laboratory studies:  Results for orders placed or performed during the hospital encounter of 11/24/17  Aerobic/Anaerobic Culture (surgical/deep wound)  Result Value Ref Range   Specimen Description SHOULDER LEFT    Special Requests SPEC ON SWABS    Gram Stain NO WBC SEEN NO ORGANISMS SEEN     Culture      No growth aerobically or anaerobically. Performed at [facility], [place] [address], [place], [place] [address]    Report Status 11/29/2017 FINAL   Aerobic/Anaerobic Culture (surgical/deep wound)  Result Value Ref Range   Specimen Description SHOULDER LEFT SUB ACROMIAL SPACE    Special Requests NONE    Gram Stain      RARE WBC PRESENT, PREDOMINANTLY PMN NO ORGANISMS SEEN    Culture      No growth aerobically or anaerobically. Performed at [facility], [place] [address] [place], [place] [address]  Report Status 11/29/2017 FINAL   Basic metabolic panel  Result Value Ref Range   Sodium 141 135 - 145 mmol/L   Potassium 4.2 3.5 - 5.1 mmol/L   Chloride 105 101 - 111 mmol/L   CO2 28 22 - 32 mmol/L   Glucose, Bld 85 65 - 99 mg/dL   BUN 7 6 - 20 mg/dL   Creatinine, Ser 0.99 0.61 - 1.24 mg/dL   Calcium 9.4 8.9 - 10.3 mg/dL   GFR calc non Af Amer >60 >60 mL/min   GFR calc Af Amer >60 >60 mL/min   Anion gap 8 5 - 15  CBC  Result Value Ref Range   WBC 5.3 4.0 - 10.5 K/uL   RBC 4.01 (L) 4.22 - 5.81 MIL/uL   Hemoglobin 12.1 (L) 13.0 - 17.0 g/dL   HCT 37.5 (L) 39.0 - 52.0  %   MCV 93.5 78.0 - 100.0 fL   MCH 30.2 26.0 - 34.0 pg   MCHC 32.3 30.0 - 36.0 g/dL   RDW 14.7 11.5 - 15.5 %   Platelets 287 150 - 400 K/uL  Sedimentation rate  Result Value Ref Range   Sed Rate 56 (H) 0 - 16 mm/hr  C-reactive protein  Result Value Ref Range   CRP <0.8 <1.0 mg/dL  Basic metabolic panel  Result Value Ref Range   Sodium 137 135 - 145 mmol/L   Potassium 3.7 3.5 - 5.1 mmol/L   Chloride 102 101 - 111 mmol/L   CO2 24 22 - 32 mmol/L   Glucose, Bld 104 (H) 65 - 99 mg/dL   BUN 12 6 - 20 mg/dL   Creatinine, Ser 1.02 0.61 - 1.24 mg/dL   Calcium 9.3 8.9 - 10.3 mg/dL   GFR calc non Af Amer >60 >60 mL/min   GFR calc Af Amer >60 >60 mL/min   Anion gap 11 5 - 15  CBC  Result Value Ref Range   WBC 8.7 4.0 - 10.5 K/uL   RBC 3.61 (L) 4.22 - 5.81 MIL/uL   Hemoglobin 10.9 (L) 13.0 - 17.0 g/dL   HCT 33.3 (L) 39.0 - 52.0 %   MCV 92.2 78.0 - 100.0 fL   MCH 30.2 26.0 - 34.0 pg   MCHC 32.7 30.0 - 36.0 g/dL   RDW 14.6 11.5 - 15.5 %   Platelets 272 150 - 400 K/uL  Glucose, capillary  Result Value Ref Range   Glucose-Capillary 151 (H) 65 - 99 mg/dL  Basic metabolic panel  Result Value Ref Range   Sodium 140 135 - 145 mmol/L   Potassium 3.7 3.5 - 5.1 mmol/L   Chloride 107 101 - 111 mmol/L   CO2 25 22 - 32 mmol/L   Glucose, Bld 115 (H) 65 - 99 mg/dL   BUN 10 6 - 20 mg/dL   Creatinine, Ser 1.11 0.61 - 1.24 mg/dL   Calcium 8.5 (L) 8.9 - 10.3 mg/dL   GFR calc non Af Amer >60 >60 mL/min   GFR calc Af Amer >60 >60 mL/min   Anion gap 8 5 - 15  Glucose, capillary  Result Value Ref Range   Glucose-Capillary 110 (H) 65 - 99 mg/dL    Discharge Medications:   Allergies as of 11/26/2017      Reactions   Pork-derived Products Nausea And Vomiting      Medication List    STOP taking these medications   benzonatate 100 MG capsule Commonly known as:  TESSALON   loperamide 2 MG capsule  Commonly known as:  IMODIUM   oxyCODONE-acetaminophen 5-325 MG tablet Commonly known as:   PERCOCET/ROXICET     TAKE these medications   ciprofloxacin 500 MG tablet Commonly known as:  CIPRO Take 1 tablet (500 mg total) by mouth 2 (two) times daily.   docusate sodium 100 MG capsule Commonly known as:  COLACE Take 1 capsule (100 mg total) by mouth 2 (two) times daily.   furosemide 20 MG tablet Commonly known as:  LASIX Take 20 mg by mouth daily.   methocarbamol 500 MG tablet Commonly known as:  ROBAXIN Take 1 tablet (500 mg total) by mouth every 6 (six) hours as needed for muscle spasms.   MUSCLE RUB 10-15 % Crea Apply 1 application topically as needed for muscle pain.   traMADol 50 MG tablet Commonly known as:  ULTRAM Take 1 tablet (50 mg total) by mouth every 6 (six) hours. What changed:    when to take this  reasons to take this     ASK your doctor about these medications   linezolid 600 MG tablet Commonly known as:  ZYVOX Take 1 tablet (600 mg total) by mouth 2 (two) times daily. Ask about: Should I take this medication?       Diagnostic Studies: Mr Shoulder Left W/o Contrast  Result Date: 11/10/2017 CLINICAL DATA:  Patient with a history of a septic left shoulder joint. Status post antibiotic therapy for 1 month. The patient underwent arthroscopic debridement of the glenohumeral joint, a SLAP lesion and the biceps tendon 10/11/2017. Evaluate for improvement. EXAM: MRI OF THE LEFT SHOULDER WITHOUT CONTRAST TECHNIQUE: Multiplanar, multisequence MR imaging of the shoulder was performed. No intravenous contrast was administered. COMPARISON:  Breast MRI left shoulder 10/09/2017. FINDINGS: Rotator cuff: Intrasubstance increased T2 signal in the supraspinatus and infraspinatus tendons has worsened since the prior examination. There is mild increased T2 signal in the subscapularis. Teres minor appears normal. No tear is identified. Muscles: There has been interval worsening of intrasubstance increased T2 signal in the supraspinatus, infraspinatus and subscapularis  muscle bellies since the prior MRI. Increased T2 signal in the deltoid is not markedly changed. A new focal fluid collection measuring 0.9 cm craniocaudal by 1.9 cm transverse by 0.9 cm AP is seen deep to the base of the acromion between the musculotendinous junctions of the supraspinatus infraspinatus. Biceps long head: Intact. Extensive fluid is seen about the tendon extending into the bicipital groove. There are some septations and debris about this fluid. Volume of fluid is increased since the prior examination. Acromioclavicular Joint: Mild-to-moderate degenerative change is present about the joint. The patient has a small joint effusion there is new marrow edema in the clavicular head at the joint. Type 1 acromion. Status post subacromial decompression without evidence of complication. Glenohumeral Joint: Large glenohumeral joint effusion is present. Small focal cartilage defect in the central glenoid in the midpole is more conspicuous than on the prior examination. Labrum:  The superior labrum is been debrided. Bones: There is new focal marrow edema in the anterior, superior glenoid and in the central glenoid deep to previously seen cartilage defect. Marrow edema in the humeral head centered in the greater tuberosity has increased. Cystic lesion anterior greater tuberosity measuring 1.3 cm AP by 0.5 cm transverse by 0.6 cm craniocaudal is new since the prior examination. Mild marrow edema in the clavicular head is present there is a small AC joint effusion. Other: None. IMPRESSION: Interval increase in the extent and intensity of marrow edema in the humeral  head with new foci of marrow edema in the glenoid worrisome for progressive osteomyelitis. New small cystic foci in the anterior aspect of the greater tuberosity could be related to rotator cuff tendinosis or postoperative change but are worrisome for small abscesses. Small acromioclavicular joint effusion with marrow edema in the clavicular head could be  degenerative but is worrisome for septic AC joint. Marked worsening of edema in the muscle bellies of the rotator cuff most consistent with myositis. A new focal fluid collection between the musculotendinous junctions of the supraspinatus and infraspinatus is likely an abscess. Increased volume of fluid with septations and debris about the biceps tendon extending along the proximal humeral shaft worrisome for septic tenosynovitis. Electronically Signed   By: Inge Rise M.D.   On: 11/10/2017 15:18    Disposition:   Discharge Instructions    Call MD / Call 911   Complete by:  As directed    If you experience chest pain or shortness of breath, CALL 911 and be transported to the hospital emergency room.  If you develope a fever above 101 F, pus (white drainage) or increased drainage or redness at the wound, or calf pain, call your surgeon's office.   Constipation Prevention   Complete by:  As directed    Drink plenty of fluids.  Prune juice may be helpful.  You may use a stool softener, such as Colace (over the counter) 100 mg twice a day.  Use MiraLax (over the counter) for constipation as needed.   Diet - low sodium heart healthy   Complete by:  As directed    Increase activity slowly as tolerated   Complete by:  As directed       Follow-up Information    [facility] Follow up.   Specialty:  Infectious Diseases Why:  Follow up with Dr. Tommy Medal in 3-4 weeks  Contact information: [address] [place], [place] [id number] [place] [phone]           Signed: Anderson Malta 12/08/2017, 7:52 PM

## 2017-12-09 ENCOUNTER — Ambulatory Visit: Payer: Medicaid Other | Attending: Internal Medicine | Admitting: Physical Therapy

## 2017-12-09 ENCOUNTER — Encounter: Payer: Self-pay | Admitting: Physical Therapy

## 2017-12-09 ENCOUNTER — Other Ambulatory Visit: Payer: Self-pay

## 2017-12-09 ENCOUNTER — Encounter (HOSPITAL_COMMUNITY): Payer: Self-pay | Admitting: Emergency Medicine

## 2017-12-09 ENCOUNTER — Emergency Department (HOSPITAL_COMMUNITY)
Admission: EM | Admit: 2017-12-09 | Discharge: 2017-12-09 | Disposition: A | Discharge status: Home / Self Care | Payer: Medicaid Other | Attending: Emergency Medicine | Admitting: Emergency Medicine

## 2017-12-09 DIAGNOSIS — R293 Abnormal posture: Secondary | ICD-10-CM | POA: Insufficient documentation

## 2017-12-09 DIAGNOSIS — Z79899 Other long term (current) drug therapy: Secondary | ICD-10-CM | POA: Diagnosis not present

## 2017-12-09 DIAGNOSIS — S40262A Insect bite (nonvenomous) of left shoulder, initial encounter: Secondary | ICD-10-CM | POA: Diagnosis not present

## 2017-12-09 DIAGNOSIS — M25512 Pain in left shoulder: Secondary | ICD-10-CM | POA: Insufficient documentation

## 2017-12-09 DIAGNOSIS — Y939 Activity, unspecified: Secondary | ICD-10-CM | POA: Insufficient documentation

## 2017-12-09 DIAGNOSIS — W57XXXA Bitten or stung by nonvenomous insect and other nonvenomous arthropods, initial encounter: Secondary | ICD-10-CM | POA: Insufficient documentation

## 2017-12-09 DIAGNOSIS — Y999 Unspecified external cause status: Secondary | ICD-10-CM | POA: Diagnosis not present

## 2017-12-09 DIAGNOSIS — M25612 Stiffness of left shoulder, not elsewhere classified: Secondary | ICD-10-CM | POA: Insufficient documentation

## 2017-12-09 DIAGNOSIS — R252 Cramp and spasm: Secondary | ICD-10-CM | POA: Insufficient documentation

## 2017-12-09 DIAGNOSIS — I10 Essential (primary) hypertension: Secondary | ICD-10-CM | POA: Diagnosis not present

## 2017-12-09 DIAGNOSIS — M6281 Muscle weakness (generalized): Secondary | ICD-10-CM | POA: Insufficient documentation

## 2017-12-09 DIAGNOSIS — Y929 Unspecified place or not applicable: Secondary | ICD-10-CM | POA: Diagnosis not present

## 2017-12-09 DIAGNOSIS — R110 Nausea: Secondary | ICD-10-CM | POA: Insufficient documentation

## 2017-12-09 DIAGNOSIS — S4992XA Unspecified injury of left shoulder and upper arm, initial encounter: Secondary | ICD-10-CM | POA: Diagnosis present

## 2017-12-09 MED ORDER — ONDANSETRON 4 MG PO TBDP
4.0000 mg | ORAL_TABLET | Freq: Once | ORAL | Status: AC
  Administered 2017-12-09: 4 mg via ORAL
  Filled 2017-12-09: qty 1

## 2017-12-09 MED ORDER — PROMETHAZINE HCL 25 MG PO TABS: 25.0000 mg | ORAL_TABLET | Freq: Three times a day (TID) | ORAL | 0 refills | Status: DC | PRN

## 2017-12-09 MED ORDER — ACETAMINOPHEN 500 MG PO TABS
1000.0000 mg | ORAL_TABLET | Freq: Once | ORAL | Status: AC
  Administered 2017-12-09: 1000 mg via ORAL
  Filled 2017-12-09: qty 2

## 2017-12-09 NOTE — Patient Instructions (Addendum)
   Seated Passive Shoulder Flexion  Place both arms up on table/desk/counter with elbows straight and thumbs up as pictured. While seated on rolling stool or office chair, scoot back away from the table, allowing your shoulder to passively move into flexion.   Go to a point where you feel a good comfortable stretch, do not stretch to the point of pain.    When you feel the stretch, hold for at least 10 seconds, then sit up straight and relax.  Repeat 10 times, 3 times per day.     Shoulder Abduction AAROM Tabletop Exercise  Start by positioning chair and body so that they are perpendicular to the table, far enough away that forearm of affected side can rest on table.  With forearm resting on table and palm facing upward, bring head down toward arm and slowly slide arm away from body, keeping trunk away from table.   Hold this position for 10 seconds when you start to feel a stretch in the affected shoulder, then slowly slide arm back to the starting position.  Repeat 10-15 times, 3 times per day.     External Rotation Door Pivot Stretch  Standing in a door frame, place a small pillow or rolled up towel underneath the elbow of your affected shoulder. Place your wrist on the outside of the door frame, elbow should be bent at 90 degrees, then begin rotating inward toward unaffected shoulder/away from affected shoulder until you feel a stretch in the shoulder.   Hold for 10 seconds, then relax.  Repeat 10 times, 3 times per day.       SCAPULAR RETRACTIONS  Draw your shoulder blades back and down.  It should feel like you are squeezing your shoulder blades together. Your shoulders should not come up in your ears.  Repeat 20 times, 3 times per day.    SHOULDER ROLLS  Move your shoulders in a circular pattern as shown so that your are moving in an up, back and down direction. Perform small circles if needed for comfort.  Repeat 20 times, 3 times per day.

## 2017-12-09 NOTE — Discharge Instructions (Addendum)
1.  Make an appointment for recheck with your family doctor within the next 2 to 3 days.

## 2017-12-09 NOTE — ED Provider Notes (Signed)
[facility] DEPT Provider Note   CSN: [id number] Arrival date & time: 12/09/17  0258     History   Chief Complaint Chief Complaint  Patient presents with  . Tick Removal    HPI Jonathan Meadows is a 47 y.o. male.  HPI Patient states he removed a tick from his left clavicle area this morning.  He did not bring it with him for examination.  He describes it as brown and may be 4 to 5 mm.  He denies he has any pain swelling or rash in the area of the removal.  He reports he feels somewhat nauseated.  He is requesting something for nausea.  He has not had fever or chills.  He has had a shoulder surgery on the left and has a well-healing wound.  No erythema.no  Fevers. Past Medical History:  Diagnosis Date  . Anemia   . Cellulitis   . Homelessness   . Lymphedema of leg   . Paranoid schizophrenia ([place])    pt denies  . Septic joint of left shoulder region [facility]) 10/27/2017    Patient Active Problem List   Diagnosis Date Noted  . History of schizophrenia   . Infection of shoulder ([place]) 11/24/2017  . Septic joint of left shoulder region ([place]) 10/27/2017  . Adhesive capsulitis of left shoulder   . Impingement syndrome of left shoulder   . Effusion of left shoulder joint 10/10/2017  . Chest pain 10/08/2017  . Cellulitis 10/08/2017  . Sepsis with cutaneous manifestations ([place]) 07/02/2017  . GAD (generalized anxiety disorder) 06/07/2017  . Financial difficulties 06/07/2017  . Lymphedema 06/07/2017  . Gastric outlet obstruction 10/31/2016  . Chronic pain associated with significant psychosocial dysfunction 06/04/2016  . Schizophrenia, chronic condition with acute exacerbation ([place]) 09/11/2015  . Hypokalemia 01/15/2015  . Hypertension 12/03/2014  . Polysubstance abuse ([place]) 12/03/2014  . Schizoaffective disorder, bipolar type ([place]) 12/03/2014  . Back pain 08/11/2013  . Cellulitis and abscess of finger 10/10/2012  . Swollen leg 10/10/2012  . Anemia  10/10/2012    Past Surgical History:  Procedure Laterality Date  . SHOULDER ARTHROSCOPY Left 10/11/2017   Procedure: ARTHROSCOPY SHOULDER, LEFT;  Surgeon: Newt Minion, MD;  Location: [facility];  Service: Orthopedics;  Laterality: Left;  . SHOULDER ARTHROSCOPY WITH SUBACROMIAL DECOMPRESSION, ROTATOR CUFF REPAIR AND BICEP TENDON REPAIR Left 11/24/2017   Procedure: LEFT SHOULDER ARTHROSCOPY, OPEN DISTAL CLAVICLE EXCISION, EXTENSIVE INTRA-ARTICULAR AND EXTRA-ARTICULAR DEBRIDEMENT;  Surgeon: [name], MD;  Location: [facility];  Service: Orthopedics;  Laterality: Left;        Home Medications    Prior to Admission medications   Medication Sig Start Date End Date Taking? Authorizing Provider  ciprofloxacin (CIPRO) 500 MG tablet Take 1 tablet (500 mg total) by mouth 2 (two) times daily. 11/26/17   [name], MD  docusate sodium (COLACE) 100 MG capsule Take 1 capsule (100 mg total) by mouth 2 (two) times daily. 11/26/17   [name], MD  furosemide (LASIX) 20 MG tablet Take 20 mg by mouth daily.    [provider]  Menthol-Methyl Salicylate (MUSCLE RUB) 10-15 % CREA Apply 1 application topically as needed for muscle pain. 11/26/17   [name], MD  Menthol-Methyl Salicylate (MUSCLE RUB) 10-15 % CREA Apply 1 application topically as needed for muscle pain. 12/02/17   [name], MD  methocarbamol (ROBAXIN) 500 MG tablet Take 1 tablet (500 mg total) by mouth every 6 (six) hours as needed  for muscle spasms. 11/26/17   [name], MD  methocarbamol (ROBAXIN) 500 MG tablet Take 1 tablet (500 mg total) by mouth every 8 (eight) hours as needed for muscle spasms. 12/02/17   [name], MD  ondansetron (ZOFRAN ODT) 4 MG disintegrating tablet Take 1 tablet (4 mg total) by mouth every 8 (eight) hours as needed for nausea or vomiting. 12/06/17   Glendell Docker, NP  oxyCODONE-acetaminophen (PERCOCET) 10-325 MG tablet 1 po q 4-6hrs prn pain 12/02/17    [name], MD  promethazine (PHENERGAN) 25 MG tablet Take 1 tablet (25 mg total) by mouth every 8 (eight) hours as needed for nausea or vomiting. 12/09/17   Charlesetta Shanks, MD  traMADol (ULTRAM) 50 MG tablet Take 1 tablet (50 mg total) by mouth every 6 (six) hours. 11/26/17   [name], MD    Family History Family History  Problem Relation Age of Onset  . CAD Unknown        Neg HX  . Hypertension Unknown        Neg Hx  . Diabetes Unknown        Neg Hx  . Cancer Unknown        Neg Hx  . Lupus Mother     Social History Social History   Tobacco Use  . Smoking status: Never Smoker  . Smokeless tobacco: Never Used  Substance Use Topics  . Alcohol use: No  . Drug use: No     Allergies   Pork-derived products   Review of Systems Review of Systems 10 Systems reviewed and are negative for acute change except as noted in the HPI.  Physical Exam Updated Vital Signs BP (!) 154/102 (BP Location: Right Arm)   Pulse 90   Temp (!) 97.5 F (36.4 C) (Oral)   Resp 17   Ht 6\' 1"  (1.854 m)   Wt 94.1 kg (207 lb 6 oz)   SpO2 100%   BMI 27.36 kg/m   Physical Exam  Constitutional: He is oriented to person, place, and time. He appears well-developed and well-nourished. No distress.  HENT:  Head: Normocephalic and atraumatic.  Eyes: EOM are normal.  Neck: Neck supple.  Cardiovascular: Normal rate, regular rhythm, normal heart sounds and intact distal pulses.  Pulmonary/Chest: Effort normal and breath sounds normal.  Musculoskeletal:  Lymphedema right lower extremity, patient wears a compression sock.  No acute complaints.  Healing surgical scar left shoulder.  No drainage or discharge.  Neurological: He is alert and oriented to person, place, and time. He exhibits normal muscle tone. Coordination normal.  Skin: Skin is warm and dry.  Patient points out an area just above his left clavicle where the tick was removed.  There is no visible skin change.     ED  Treatments / Results  Labs (all labs ordered are listed, but only abnormal results are displayed) Labs Reviewed - No data to display  EKG None  Radiology No results found.  Procedures Procedures (including critical care time)  Medications Ordered in ED Medications  ondansetron (ZOFRAN-ODT) disintegrating tablet 4 mg (has no administration in time range)     Initial Impression / Assessment and Plan / ED Course  I have reviewed the triage vital signs and the nursing notes.  Pertinent labs & imaging results that were available during my care of the patient were reviewed by me and considered in my medical decision making (see chart for details).      Final Clinical  Impressions(s) / ED Diagnoses   Final diagnoses:  Tick bite, initial encounter  Nausea  Patient presents for concern for a tick removed this morning.  Patient states associated symptoms of nausea.  EMR review indicates patient had a prior visit 3 days ago with a chief complaint of nausea for which he was requesting some medication.  At this time no indication of tickborne illness.  Patient is counseled on signs and symptoms which to return.  He is discharged in good condition.  ED Discharge Orders        Ordered    promethazine (PHENERGAN) 25 MG tablet  Every 8 hours PRN     12/09/17 0909       Charlesetta Shanks, MD 12/09/17 [phone]

## 2017-12-09 NOTE — Therapy (Addendum)
[place], [place], [address] Phone: [phone]   Fax:  [phone]  Physical Therapy Evaluation  Patient Details  Name: Jonathan Meadows MRN: [id number] Date of Birth: [birth year] Referring Provider: [name]    Encounter Date: 12/09/2017  PT End of Session - 12/09/17 1427    Visit Number  1    Number of Visits  4    Date for PT Re-Evaluation  12/30/17    Authorization Type  medicaid     Authorization - Visit Number  1    Authorization - Number of Visits  4    PT Start Time  1330    PT Stop Time  1415    PT Time Calculation (min)  45 min    Activity Tolerance  Patient tolerated treatment well    Behavior During Therapy  [facility] for tasks assessed/performed       Past Medical History:  Diagnosis Date  . Anemia   . Cellulitis   . Homelessness   . Lymphedema of leg   . Paranoid schizophrenia ([place])    pt denies  . Septic joint of left shoulder region [facility]) 10/27/2017    Past Surgical History:  Procedure Laterality Date  . SHOULDER ARTHROSCOPY Left 10/11/2017   Procedure: ARTHROSCOPY SHOULDER, LEFT;  Surgeon: Newt Minion, MD;  Location: [facility];  Service: Orthopedics;  Laterality: Left;  . SHOULDER ARTHROSCOPY WITH SUBACROMIAL DECOMPRESSION, ROTATOR CUFF REPAIR AND BICEP TENDON REPAIR Left 11/24/2017   Procedure: LEFT SHOULDER ARTHROSCOPY, OPEN DISTAL CLAVICLE EXCISION, EXTENSIVE INTRA-ARTICULAR AND EXTRA-ARTICULAR DEBRIDEMENT;  Surgeon: [name], MD;  Location: [facility];  Service: Orthopedics;  Laterality: Left;    There were no vitals filed for this visit.   Subjective Assessment - 12/09/17 1333    Subjective  I had 2 surgeries- one in April and the most recent on May 28th. Most recent surgery was arthorscopy, distal clavicle excision, and debridement. It is difficult for me to lift my left arm up, I can only use my arm to dress/perform self care to a certain extent. It is hard for me to  move it in general.     Pertinent History  schizophrenia, septic shoulder, chronic right LE edema    Patient Stated Goals  get arm back to working properly, get pain down     Currently in Pain?  Yes    Pain Score  9     Pain Location  Shoulder    Pain Orientation  Left    Pain Descriptors / Indicators  Stabbing;Sharp    Pain Type  Acute pain    Pain Radiating Towards  back hurts, pain goes all the way down arm     Pain Onset  1 to 4 weeks ago    Pain Frequency  Constant    Aggravating Factors   moving arm, picking up things     Pain Relieving Factors  unsure     Effect of Pain on Daily Activities  severe         [facility] PT Assessment - 12/09/17 0001      Assessment   Medical Diagnosis  s/p L shoulder surgery     Referring Provider  [name]     Onset Date/Surgical Date  11/24/17    Next MD Visit  Dr Sharol Given next month    Prior Therapy  PT in hosptial post surgery, 2 sessions after first surgery       Restrictions  Weight Bearing Restrictions  No      Balance Screen   Has the patient fallen in the past 6 months  No    Has the patient had a decrease in activity level because of a fear of falling?   No    Is the patient reluctant to leave their home because of a fear of falling?   No      Home Environment   Living Environment  Private residence    Living Arrangements  Alone    Type of [place] to enter;Level entry    [place]  One level      Prior Function   Level of Independence  Independent;Independent with basic ADLs;Independent with gait;Independent with transfers    Vocation  On disability    Leisure  working out, exercise       Posture/Postural Control   Posture/Postural Control  Postural limitations    Postural Limitations  Flexed trunk;Rounded Shoulders;Forward head    Posture Comments  decreased arm swing      AROM   Left Shoulder Flexion  72 Degrees AAROM     Left Shoulder ABduction  50 Degrees AAROM     Left  Shoulder Internal Rotation  60 Degrees AAROM     Left Shoulder External Rotation  40 Degrees AAROM                 Objective measurements completed on examination: See above findings.      [facility] Adult PT Treatment/Exercise - 12/09/17 0001      Shoulder Exercises: Supine   External Rotation  Left;10 reps;PROM    Internal Rotation  PROM;Left;10 reps    Flexion  PROM;Left;15 reps;Other (comment) to approximately 90 degrees     ABduction  PROM;Left;15 reps;Other (comment) to approximatley 90 degrees       Shoulder Exercises: Stretch   Table Stretch - Flexion  10 seconds x10    Table Stretch - Abduction  10 seconds;Other (comment) x10     Other Shoulder Stretches  ER stretch 5x10 seconds              PT Education - 12/09/17 1426    Education provided  Yes    Education Details  exam findings, POC, HEP, POC and prognosis moving forward     Person(s) Educated  Patient    Methods  Explanation;Handout;Verbal cues    Comprehension  Verbalized understanding;Returned demonstration       PT Short Term Goals - 12/09/17 1431      PT SHORT TERM GOAL #1   Title  Pt will be independent with initial HEP    Time  1    Period  Weeks    Status  New    Target Date  12/16/17      PT SHORT TERM GOAL #2   Title  Patient to improve PROM of L shoulder flexion and abduction  to at least 120 degrees to show improvement of shoulder mobility     Time  3    Period  Weeks    Status  New    Target Date  12/30/17      PT SHORT TERM GOAL #3   Title  Patient to improve PROM of L shoulder IR and ER to at least 70 degrees in order to show improvement of shoulder mobility     Time  3    Period  Weeks    Status  New      PT SHORT TERM GOAL #4   Title  Patient to demonstrate improved upright posture at least 75% of the time in order to reduce shoulder and neck pain and to assist in improving shoulder ROM     Time  3    Period  Weeks    Status  New        PT Long Term Goals -  12/09/17 1434      PT LONG TERM GOAL #1   Title  Patient to demonstrate L shoulder AROM as being at least 150 degrees flexion and abduction in order to show improved shoulder mobility and improved ability to perform functional tasks     Time  8    Period  Weeks    Status  New    Target Date  02/03/18      PT LONG TERM GOAL #2   Title  Patient to demonstrate L shoulder AROM as being at least 80 degrees in IR and ER in order to show improved shoulder mobility and improved ability to perform self care tasks     Time  8    Period  Weeks    Status  New      PT LONG TERM GOAL #3   Title  Patient to report no difficulties with self-care tasks such as dressing, washing hair, and bathing in order to show improved functional use of L shoulder     Time  8    Period  Weeks    Status  New      PT LONG TERM GOAL #4   Title  Patient to report pain in L shoulder as being no more than 3/10 in order to improve QOL and allow him to tolerate performing more functional tasks with L shoulder     Time  8    Period  Weeks    Status  New             Plan - 12/09/17 1428    Clinical Impression Statement  Patient returns to PT following L shoulder arthroscopy, distal clavicle excision, and intra and extra articular debridement performed on 11/24/17; of note, he also received L shoulder arthroscopy on 10/11/17 and received only 2 PT visits prior to being put on hold by MD. Examination reveals severe shoulder stiffness and hypomobility, postural limitations, and reduced functional strength even within available ROM causing difficulties with self-care activities. He will benefit from skilled PT services to address functional deficits, reduce pain, and assist in restoring shoulder function moving forward.     Clinical Presentation  Stable    Clinical Decision Making  Low    Rehab Potential  Good    PT Frequency  3x / week    PT Duration  Other (comment) in 1 week then resubmit medicaid auth    PT  Treatment/Interventions  Dry needling;Passive range of motion;ADLs/Self Care Home Management;Cryotherapy;Electrical Stimulation;Iontophoresis 4mg /ml Dexamethasone;Moist Heat;Ultrasound;Therapeutic exercise;Therapeutic activities;Neuromuscular re-education;Patient/family education;Manual techniques;Taping    PT Next Visit Plan  focus on ROM, posture, soft tissue limitations due to severe impairment     PT Home Exercise Plan  pendulum, Active assist flexion. elbow flex/ext/ supine press up with cane/ supine cane exercises, flexion stretch, ABD stretch, ER stretch     Consulted and Agree with Plan of Care  Patient       Patient will benefit from skilled therapeutic intervention in order to improve the following deficits and impairments:  Increased fascial restricitons, Improper body  mechanics, Pain, Increased muscle spasms, Postural dysfunction, Decreased range of motion, Decreased strength, Hypomobility, Impaired UE functional use, Impaired flexibility  Visit Diagnosis: Acute pain of left shoulder - Plan: PT plan of care cert/re-cert  Stiffness of left shoulder, not elsewhere classified - Plan: PT plan of care cert/re-cert  Cramp and spasm - Plan: PT plan of care cert/re-cert  Abnormal posture - Plan: PT plan of care cert/re-cert  Muscle weakness (generalized) - Plan: PT plan of care cert/re-cert     Problem List Patient Active Problem List   Diagnosis Date Noted  . History of schizophrenia   . Infection of shoulder ([place]) 11/24/2017  . Septic joint of left shoulder region ([place]) 10/27/2017  . Adhesive capsulitis of left shoulder   . Impingement syndrome of left shoulder   . Effusion of left shoulder joint 10/10/2017  . Chest pain 10/08/2017  . Cellulitis 10/08/2017  . Sepsis with cutaneous manifestations ([place]) 07/02/2017  . GAD (generalized anxiety disorder) 06/07/2017  . Financial difficulties 06/07/2017  . Lymphedema 06/07/2017  . Gastric outlet obstruction 10/31/2016  . Chronic  pain associated with significant psychosocial dysfunction 06/04/2016  . Schizophrenia, chronic condition with acute exacerbation ([place]) 09/11/2015  . Hypokalemia 01/15/2015  . Hypertension 12/03/2014  . Polysubstance abuse ([place]) 12/03/2014  . Schizoaffective disorder, bipolar type ([place]) 12/03/2014  . Back pain 08/11/2013  . Cellulitis and abscess of finger 10/10/2012  . Swollen leg 10/10/2012  . Anemia 10/10/2012    Deniece Ree PT, DPT, CBIS  Supplemental Physical Therapist [facility]   Pager [phone]  12/09/17- corrected clinical assessment paragraph- per review of surgical note, no RCR was done in surgery on 11/24/17.    [place], [place], [address] Phone: [phone]   Fax:  [phone]  Name: Jonathan Meadows MRN: [id number] Date of Birth: [birth year]

## 2017-12-09 NOTE — ED Triage Notes (Signed)
Pt reports that he found tick on him this morning and pulled it off. Reports that he has had some nausea. Denies vomiting or diarrhea.

## 2017-12-15 ENCOUNTER — Telehealth (INDEPENDENT_AMBULATORY_CARE_PROVIDER_SITE_OTHER): Payer: Self-pay | Admitting: Orthopedic Surgery

## 2017-12-15 NOTE — Telephone Encounter (Signed)
Patient called asking about his percocet, he wanted to know if it's been approved by his insurance yet? CB # [phone]

## 2017-12-15 NOTE — Telephone Encounter (Signed)
Medicaid denied last rx written on 06/05 but patient paid out of pocket. I have made patient aware of denial, he proceeded to be very belligerent stating that it was my fault because I dont know how to do my job and that we needed to figure out why Medicaid was not authorizing his medication. I tried to advised patient that it was out of my control and that he could contact Medicaid for further discussion of denial. Then patient stated that he has been out of his pain meds and is needing a refill.  Last rx on 06/05 was for #42 tablets. He stated he has been out so long he cant even remember.  Please advise. Thanks.

## 2017-12-17 ENCOUNTER — Encounter: Payer: Self-pay | Admitting: Physical Therapy

## 2017-12-17 ENCOUNTER — Ambulatory Visit: Payer: Medicaid Other | Admitting: Physical Therapy

## 2017-12-17 DIAGNOSIS — R293 Abnormal posture: Secondary | ICD-10-CM

## 2017-12-17 DIAGNOSIS — M6281 Muscle weakness (generalized): Secondary | ICD-10-CM

## 2017-12-17 DIAGNOSIS — R252 Cramp and spasm: Secondary | ICD-10-CM

## 2017-12-17 DIAGNOSIS — M25512 Pain in left shoulder: Secondary | ICD-10-CM | POA: Diagnosis not present

## 2017-12-17 DIAGNOSIS — M25612 Stiffness of left shoulder, not elsewhere classified: Secondary | ICD-10-CM

## 2017-12-17 NOTE — Telephone Encounter (Signed)
Transition to norco 5 325 1 po q 8 #3 then otc

## 2017-12-17 NOTE — Therapy (Signed)
[place], [place], [address] Phone: [phone]   Fax:  [phone]  Physical Therapy Treatment  Patient Details  Name: Jonathan Meadows MRN: [id number] Date of Birth: 1971/02/20 Referring Provider: [name]    Encounter Date: 12/17/2017  PT End of Session - 12/17/17 1401    Visit Number  2    Number of Visits  4    Date for PT Re-Evaluation  12/30/17    PT Start Time  1330    PT Stop Time  1408    PT Time Calculation (min)  38 min    Activity Tolerance  Patient tolerated treatment well    Behavior During Therapy  [facility] for tasks assessed/performed       Past Medical History:  Diagnosis Date  . Anemia   . Cellulitis   . Homelessness   . Lymphedema of leg   . Paranoid schizophrenia ([place])    pt denies  . Septic joint of left shoulder region [facility]) 10/27/2017    Past Surgical History:  Procedure Laterality Date  . SHOULDER ARTHROSCOPY Left 10/11/2017   Procedure: ARTHROSCOPY SHOULDER, LEFT;  Surgeon: Newt Minion, MD;  Location: [place];  Service: Orthopedics;  Laterality: Left;  . SHOULDER ARTHROSCOPY WITH SUBACROMIAL DECOMPRESSION, ROTATOR CUFF REPAIR AND BICEP TENDON REPAIR Left 11/24/2017   Procedure: LEFT SHOULDER ARTHROSCOPY, OPEN DISTAL CLAVICLE EXCISION, EXTENSIVE INTRA-ARTICULAR AND EXTRA-ARTICULAR DEBRIDEMENT;  Surgeon: [name], MD;  Location: [place];  Service: Orthopedics;  Laterality: Left;    There were no vitals filed for this visit.  Subjective Assessment - 12/17/17 1327    Subjective  "the shoulder is still painful at times and stiffness in the shoulder. Still try to do exercises about an hour a day"     Currently in Pain?  Yes    Pain Score  6  last took meds for pain 4-5 hours ago    Pain Orientation  Left    Aggravating Factors   lifting too much, leaning onto it.    Pain Relieving Factors  medication,                        [facility] Adult PT  Treatment/Exercise - 12/17/17 1335      Shoulder Exercises: Seated   Other Seated Exercises  table slides flexion 1 x 10 holding 10 sec, 1 x 10 abduction       Shoulder Exercises: Isometric Strengthening   Flexion  5X5"    Extension  5X5"    External Rotation  5X5"    Internal Rotation  5X5"    ABduction  5X5"      Shoulder Exercises: Stretch   Other Shoulder Stretches  ER stretch 5x10 seconds       Manual Therapy   Soft tissue mobilization  distal clavicle mobs inferior/ posterior grade 3, GHJ grade 3 inferior/ posterior.    Passive ROM  flex, abd, ER to available ROM with gentle distraction osscillation for pain  relief             PT Education - 12/17/17 1401    Education provided  Yes    Education Details  reviewed previously provided HEP    Person(s) Educated  Patient    Methods  Explanation;Verbal cues;Handout    Comprehension  Verbalized understanding;Verbal cues required       PT Short Term Goals - 12/09/17 1431      PT SHORT TERM  GOAL #1   Title  Pt will be independent with initial HEP    Time  1    Period  Weeks    Status  New    Target Date  12/16/17      PT SHORT TERM GOAL #2   Title  Patient to improve PROM of L shoulder flexion and abduction  to at least 120 degrees to show improvement of shoulder mobility     Time  3    Period  Weeks    Status  New    Target Date  12/30/17      PT SHORT TERM GOAL #3   Title  Patient to improve PROM of L shoulder IR and ER to at least 70 degrees in order to show improvement of shoulder mobility     Time  3    Period  Weeks    Status  New      PT SHORT TERM GOAL #4   Title  Patient to demonstrate improved upright posture at least 75% of the time in order to reduce shoulder and neck pain and to assist in improving shoulder ROM     Time  3    Period  Weeks    Status  New        PT Long Term Goals - 12/09/17 1434      PT LONG TERM GOAL #1   Title  Patient to demonstrate L shoulder AROM as being at least  150 degrees flexion and abduction in order to show improved shoulder mobility and improved ability to perform functional tasks     Time  8    Period  Weeks    Status  New    Target Date  02/03/18      PT LONG TERM GOAL #2   Title  Patient to demonstrate L shoulder AROM as being at least 80 degrees in IR and ER in order to show improved shoulder mobility and improved ability to perform self care tasks     Time  8    Period  Weeks    Status  New      PT LONG TERM GOAL #3   Title  Patient to report no difficulties with self-care tasks such as dressing, washing hair, and bathing in order to show improved functional use of L shoulder     Time  8    Period  Weeks    Status  New      PT LONG TERM GOAL #4   Title  Patient to report pain in L shoulder as being no more than 3/10 in order to improve QOL and allow him to tolerate performing more functional tasks with L shoulder     Time  8    Period  Weeks    Status  New            Plan - 12/17/17 1402    Clinical Impression Statement  pt reports 6/10 pain today. continued focusing on shoulder mobility with GHJ mobs and distal clavicle mobs to decrease pain. Progressed therex with isometric exercise with cues for slow controlled contraction which he performed well reporting decreased pain at end of session and declined modalities.     PT Treatment/Interventions  Dry needling;Passive range of motion;ADLs/Self Care Home Management;Cryotherapy;Electrical Stimulation;Iontophoresis 4mg /ml Dexamethasone;Moist Heat;Ultrasound;Therapeutic exercise;Therapeutic activities;Neuromuscular re-education;Patient/family education;Manual techniques;Taping    PT Next Visit Plan  focus on ROM, posture, soft tissue limitations due to severe impairment     PT  Home Exercise Plan  pendulum, Active assist flexion. elbow flex/ext/ supine press up with cane/ supine cane exercises, flexion stretch, ABD stretch, ER stretch     Consulted and Agree with Plan of Care   Patient       Patient will benefit from skilled therapeutic intervention in order to improve the following deficits and impairments:  Increased fascial restricitons, Improper body mechanics, Pain, Increased muscle spasms, Postural dysfunction, Decreased range of motion, Decreased strength, Hypomobility, Impaired UE functional use, Impaired flexibility  Visit Diagnosis: Acute pain of left shoulder  Stiffness of left shoulder, not elsewhere classified  Cramp and spasm  Abnormal posture  Muscle weakness (generalized)     Problem List Patient Active Problem List   Diagnosis Date Noted  . History of schizophrenia   . Infection of shoulder ([place]) 11/24/2017  . Septic joint of left shoulder region ([place]) 10/27/2017  . Adhesive capsulitis of left shoulder   . Impingement syndrome of left shoulder   . Effusion of left shoulder joint 10/10/2017  . Chest pain 10/08/2017  . Cellulitis 10/08/2017  . Sepsis with cutaneous manifestations ([place]) 07/02/2017  . GAD (generalized anxiety disorder) 06/07/2017  . Financial difficulties 06/07/2017  . Lymphedema 06/07/2017  . Gastric outlet obstruction 10/31/2016  . Chronic pain associated with significant psychosocial dysfunction 06/04/2016  . Schizophrenia, chronic condition with acute exacerbation ([employer]) 09/11/2015  . Hypokalemia 01/15/2015  . Hypertension 12/03/2014  . Polysubstance abuse ([place]) 12/03/2014  . Schizoaffective disorder, bipolar type ([place]) 12/03/2014  . Back pain 08/11/2013  . Cellulitis and abscess of finger 10/10/2012  . Swollen leg 10/10/2012  . Anemia 10/10/2012   Starr Lake PT, DPT, LAT, ATC  12/17/17  2:12 PM      [facility] [address] [place], [place], [address] Phone: [phone]   Fax:  [phone]  Name: Brayden Brodhead MRN: [id number] Date of Birth: 1971/03/31

## 2017-12-18 ENCOUNTER — Encounter: Payer: Self-pay | Admitting: Physical Therapy

## 2017-12-18 ENCOUNTER — Ambulatory Visit: Payer: Medicaid Other | Admitting: Physical Therapy

## 2017-12-18 DIAGNOSIS — M6281 Muscle weakness (generalized): Secondary | ICD-10-CM

## 2017-12-18 DIAGNOSIS — M25512 Pain in left shoulder: Secondary | ICD-10-CM | POA: Diagnosis not present

## 2017-12-18 DIAGNOSIS — R252 Cramp and spasm: Secondary | ICD-10-CM

## 2017-12-18 DIAGNOSIS — R293 Abnormal posture: Secondary | ICD-10-CM

## 2017-12-18 DIAGNOSIS — M25612 Stiffness of left shoulder, not elsewhere classified: Secondary | ICD-10-CM

## 2017-12-18 MED ORDER — HYDROCODONE-ACETAMINOPHEN 5-325 MG PO TABS: 1.0000 | ORAL_TABLET | Freq: Three times a day (TID) | ORAL | 0 refills | Status: DC | PRN

## 2017-12-18 NOTE — Therapy (Signed)
[place], [place], [address] Phone: [phone]   Fax:  [phone]  Physical Therapy Treatment  Patient Details  Name: Jonathan Meadows MRN: [id number] Date of Birth: 15-Apr-1971 Referring Provider: [name]    Encounter Date: 12/18/2017  PT End of Session - 12/18/17 1237    Visit Number  3    Number of Visits  4    Date for PT Re-Evaluation  12/30/17    Authorization Type  medicaid     PT Start Time  1147    PT Stop Time  1230    PT Time Calculation (min)  43 min    Activity Tolerance  Patient tolerated treatment well    Behavior During Therapy  [facility] for tasks assessed/performed       Past Medical History:  Diagnosis Date  . Anemia   . Cellulitis   . Homelessness   . Lymphedema of leg   . Paranoid schizophrenia ([place])    pt denies  . Septic joint of left shoulder region [facility]) 10/27/2017    Past Surgical History:  Procedure Laterality Date  . SHOULDER ARTHROSCOPY Left 10/11/2017   Procedure: ARTHROSCOPY SHOULDER, LEFT;  Surgeon: Newt Minion, MD;  Location: [place];  Service: Orthopedics;  Laterality: Left;  . SHOULDER ARTHROSCOPY WITH SUBACROMIAL DECOMPRESSION, ROTATOR CUFF REPAIR AND BICEP TENDON REPAIR Left 11/24/2017   Procedure: LEFT SHOULDER ARTHROSCOPY, OPEN DISTAL CLAVICLE EXCISION, EXTENSIVE INTRA-ARTICULAR AND EXTRA-ARTICULAR DEBRIDEMENT;  Surgeon: [name], MD;  Location: [place];  Service: Orthopedics;  Laterality: Left;    There were no vitals filed for this visit.  Subjective Assessment - 12/18/17 1152    Subjective  "I am having only 5-6/10 pain but I was happy with what we did the other day"     Currently in Pain?  Yes    Pain Score  5     Pain Orientation  Left    Pain Descriptors / Indicators  Aching;Sore    Pain Type  Chronic pain    Pain Onset  1 to 4 weeks ago    Pain Frequency  Intermittent                       [facility] Adult PT  Treatment/Exercise - 12/18/17 1154      Elbow Exercises   Elbow Flexion  -- eccentric curl 2 x 10 with red theraband in supine      Shoulder Exercises: P[place]ys   Flexion  3 minutes verbal cues to gradually increase range and avoid hiking      Shoulder Exercises: Isometric Strengthening   Flexion  Other (comment) 10 x 5 sec     Extension  -- 10 x 5 sec     External Rotation  -- 10 x 5 sec     Internal Rotation  -- 10 x 5 sec       Manual Therapy   Manual Therapy  Scapular mobilization    Soft tissue mobilization  GHJ grade 3 inferior/ posterior.    Scapular Mobilization  Grade 3 in all planes    Passive ROM  flex, abd, ER to available ROM with gentle distraction osscillation for pain  relief             PT Education - 12/18/17 1221    Education provided  Yes    Education Details  updated hep for shoulder isometrics    Person(s) Educated  Patient  Methods  Explanation;Verbal cues;Demonstration;Handout    Comprehension  Verbalized understanding;Verbal cues required;Returned demonstration       PT Short Term Goals - 12/09/17 1431      PT SHORT TERM GOAL #1   Title  Pt will be independent with initial HEP    Time  1    Period  Weeks    Status  New    Target Date  12/16/17      PT SHORT TERM GOAL #2   Title  Patient to improve PROM of L shoulder flexion and abduction  to at least 120 degrees to show improvement of shoulder mobility     Time  3    Period  Weeks    Status  New    Target Date  12/30/17      PT SHORT TERM GOAL #3   Title  Patient to improve PROM of L shoulder IR and ER to at least 70 degrees in order to show improvement of shoulder mobility     Time  3    Period  Weeks    Status  New      PT SHORT TERM GOAL #4   Title  Patient to demonstrate improved upright posture at least 75% of the time in order to reduce shoulder and neck pain and to assist in improving shoulder ROM     Time  3    Period  Weeks    Status  New        PT Long Term Goals  - 12/09/17 1434      PT LONG TERM GOAL #1   Title  Patient to demonstrate L shoulder AROM as being at least 150 degrees flexion and abduction in order to show improved shoulder mobility and improved ability to perform functional tasks     Time  8    Period  Weeks    Status  New    Target Date  02/03/18      PT LONG TERM GOAL #2   Title  Patient to demonstrate L shoulder AROM as being at least 80 degrees in IR and ER in order to show improved shoulder mobility and improved ability to perform self care tasks     Time  8    Period  Weeks    Status  New      PT LONG TERM GOAL #3   Title  Patient to report no difficulties with self-care tasks such as dressing, washing hair, and bathing in order to show improved functional use of L shoulder     Time  8    Period  Weeks    Status  New      PT LONG TERM GOAL #4   Title  Patient to report pain in L shoulder as being no more than 3/10 in order to improve QOL and allow him to tolerate performing more functional tasks with L shoulder     Time  8    Period  Weeks    Status  New            Plan - 12/18/17 1234    Clinical Impression Statement  pt continues to reports 5-6/10 pain but is improving shoulder ROM visually during session. continued working PROM GHJ flexion / abduction. added shoulder isometrics to HEP which he reported feeling good with. end of session she reported pain at 3/10 and declined modalties.     PT Next Visit Plan  Re-assessed for medicaid. focus on ROM,  posture, soft tissue limitations due to severe impairment     PT Home Exercise Plan  pendulum, Active assist flexion. elbow flex/ext/ supine press up with cane/ supine cane exercises, flexion stretch, ABD stretch, ER stretch , shoulder isometrics.     Consulted and Agree with Plan of Care  Patient       Patient will benefit from skilled therapeutic intervention in order to improve the following deficits and impairments:  Increased fascial restricitons, Improper body  mechanics, Pain, Increased muscle spasms, Postural dysfunction, Decreased range of motion, Decreased strength, Hypomobility, Impaired UE functional use, Impaired flexibility  Visit Diagnosis: Acute pain of left shoulder  Stiffness of left shoulder, not elsewhere classified  Cramp and spasm  Abnormal posture  Muscle weakness (generalized)     Problem List Patient Active Problem List   Diagnosis Date Noted  . History of schizophrenia   . Infection of shoulder ([place]) 11/24/2017  . Septic joint of left shoulder region ([place]) 10/27/2017  . Adhesive capsulitis of left shoulder   . Impingement syndrome of left shoulder   . Effusion of left shoulder joint 10/10/2017  . Chest pain 10/08/2017  . Cellulitis 10/08/2017  . Sepsis with cutaneous manifestations ([place]) 07/02/2017  . GAD (generalized anxiety disorder) 06/07/2017  . Financial difficulties 06/07/2017  . Lymphedema 06/07/2017  . Gastric outlet obstruction 10/31/2016  . Chronic pain associated with significant psychosocial dysfunction 06/04/2016  . Schizophrenia, chronic condition with acute exacerbation ([place]) 09/11/2015  . Hypokalemia 01/15/2015  . Hypertension 12/03/2014  . Polysubstance abuse ([place]) 12/03/2014  . Schizoaffective disorder, bipolar type ([place]) 12/03/2014  . Back pain 08/11/2013  . Cellulitis and abscess of finger 10/10/2012  . Swollen leg 10/10/2012  . Anemia 10/10/2012   Starr Lake PT, DPT, LAT, ATC  12/18/17  12:37 PM      [facility] [address] [place], [place], [address] Phone: [phone]   Fax:  [phone]  Name: Jonathan Meadows MRN: [id number] Date of Birth: 12-Aug-1970

## 2017-12-18 NOTE — Telephone Encounter (Signed)
Per Dr Marlou Sa ok for #30 rather than what is in note of #3. Printed rx and put up front, called patient to advise, no answer, no VM to LM

## 2017-12-18 NOTE — Addendum Note (Signed)
Addended byLaurann Montana on: 12/18/2017 09:30 AM   Modules accepted: Orders

## 2017-12-20 ENCOUNTER — Emergency Department (HOSPITAL_COMMUNITY)
Admission: EM | Admit: 2017-12-20 | Discharge: 2017-12-20 | Disposition: A | Discharge status: Home / Self Care | Payer: Medicaid Other | Attending: Emergency Medicine | Admitting: Emergency Medicine

## 2017-12-20 ENCOUNTER — Encounter (HOSPITAL_COMMUNITY): Payer: Self-pay | Admitting: Emergency Medicine

## 2017-12-20 ENCOUNTER — Other Ambulatory Visit: Payer: Self-pay

## 2017-12-20 DIAGNOSIS — R197 Diarrhea, unspecified: Secondary | ICD-10-CM | POA: Insufficient documentation

## 2017-12-20 DIAGNOSIS — Z5321 Procedure and treatment not carried out due to patient leaving prior to being seen by health care provider: Secondary | ICD-10-CM | POA: Diagnosis not present

## 2017-12-20 DIAGNOSIS — R109 Unspecified abdominal pain: Secondary | ICD-10-CM | POA: Insufficient documentation

## 2017-12-20 NOTE — ED Notes (Signed)
Called for room placement with no answer.

## 2017-12-20 NOTE — ED Notes (Signed)
Patient called for lab draw with no answer.

## 2017-12-20 NOTE — ED Triage Notes (Signed)
Patient c/o abdominal pain and diarrhea x24 hours. Denies N/V.

## 2017-12-20 NOTE — ED Notes (Signed)
Called for vital recheck with no answer.

## 2017-12-22 ENCOUNTER — Encounter: Payer: Self-pay | Admitting: Physical Therapy

## 2017-12-22 ENCOUNTER — Ambulatory Visit: Payer: Medicaid Other | Admitting: Physical Therapy

## 2017-12-22 DIAGNOSIS — R252 Cramp and spasm: Secondary | ICD-10-CM

## 2017-12-22 DIAGNOSIS — M25512 Pain in left shoulder: Secondary | ICD-10-CM | POA: Diagnosis not present

## 2017-12-22 DIAGNOSIS — M6281 Muscle weakness (generalized): Secondary | ICD-10-CM

## 2017-12-22 DIAGNOSIS — R293 Abnormal posture: Secondary | ICD-10-CM

## 2017-12-22 DIAGNOSIS — M25612 Stiffness of left shoulder, not elsewhere classified: Secondary | ICD-10-CM

## 2017-12-22 NOTE — Therapy (Signed)
[place], [place], [address] Phone: [phone]   Fax:  [phone]  Physical Therapy Treatment (ERO/Recert/Reauth)  Patient Details  Name: Jonathan Meadows MRN: [id number] Date of Birth: [birth year] Referring Provider: [name]    Encounter Date: 12/22/2017  PT End of Session - 12/22/17 1541    Visit Number  4    Number of Visits  20    Date for PT Re-Evaluation  01/19/18    Authorization Type  medicaid     Authorization - Visit Number  4    Authorization - Number of Visits  4    PT Start Time  1502    PT Stop Time  1540    PT Time Calculation (min)  38 min    Activity Tolerance  Patient tolerated treatment well    Behavior During Therapy  [facility] for tasks assessed/performed       Past Medical History:  Diagnosis Date  . Anemia   . Cellulitis   . Homelessness   . Lymphedema of leg   . Paranoid schizophrenia ([place])    pt denies  . Septic joint of left shoulder region [facility]) 10/27/2017    Past Surgical History:  Procedure Laterality Date  . SHOULDER ARTHROSCOPY Left 10/11/2017   Procedure: ARTHROSCOPY SHOULDER, LEFT;  Surgeon: Newt Minion, MD;  Location: [place];  Service: Orthopedics;  Laterality: Left;  . SHOULDER ARTHROSCOPY WITH SUBACROMIAL DECOMPRESSION, ROTATOR CUFF REPAIR AND BICEP TENDON REPAIR Left 11/24/2017   Procedure: LEFT SHOULDER ARTHROSCOPY, OPEN DISTAL CLAVICLE EXCISION, EXTENSIVE INTRA-ARTICULAR AND EXTRA-ARTICULAR DEBRIDEMENT;  Surgeon: [name], MD;  Location: [place];  Service: Orthopedics;  Laterality: Left;    There were no vitals filed for this visit.  Subjective Assessment - 12/22/17 1504    Subjective  I tend to be drowsy later in the day. My shoulder is still painful and stiff but it is loosening gradually.     Pertinent History  schizophrenia, septic shoulder, chronic right LE edema    Patient Stated Goals  get arm back to working properly, get pain down      Currently in Pain?  Yes    Pain Score  5     Pain Location  Shoulder    Pain Orientation  Left    Pain Descriptors / Indicators  Aching;Sore    Pain Type  Chronic pain    Pain Radiating Towards  shoots through upper arm     Pain Onset  More than a month ago    Pain Frequency  Constant    Aggravating Factors   leaning on arm, laying on arm     Pain Relieving Factors  staying off of arm, exercise/stretching/PT     Effect of Pain on Daily Activities  severe         [facility] PT Assessment - 12/22/17 0001      Assessment   Medical Diagnosis  s/p L shoulder surgery     Referring Provider  [name]     Onset Date/Surgical Date  11/24/17    Next MD Visit  Dr. Sharol Given next month     Prior Therapy  PT in hosptial post surgery, 2 sessions after first surgery       Restrictions   Weight Bearing Restrictions  No      Balance Screen   Has the patient fallen in the past 6 months  No    Has the patient had a decrease in  activity level because of a fear of falling?   No    Is the patient reluctant to leave their home because of a fear of falling?   No      Home Environment   Living Environment  Private residence    Living Arrangements  Alone    Type of [place] Access  Stairs to enter;Level entry      Prior Function   Level of Independence  Independent;Independent with basic ADLs;Independent with gait;Independent with transfers    Vocation  On disability    Leisure  working out, exercise       AROM   Left Shoulder Flexion  90 Degrees AROM, supine, great effort     Left Shoulder ABduction  53 Degrees AROM, trunk compensations     Left Shoulder Internal Rotation  63 Degrees AROM at 40 degrees ABD     Left Shoulder External Rotation  68 Degrees at 40 degrees ABD, AROM       Strength   Left Shoulder Extension  3-/5 in available range     Left Shoulder ABduction  3-/5 in available range     Left Shoulder Internal Rotation  4/5 available ROM     Left Shoulder  External Rotation  3-/5 available ROM                    [facility] Adult PT Treatment/Exercise - 12/22/17 0001      Shoulder Exercises: Seated   Retraction  20 reps;Other (comment) 3 second holds     Other Seated Exercises  backwards shoulder rolls 1x20 (up, back, down)      Manual Therapy   Manual Therapy  Other (comment);Joint mobilization    Manual therapy comments  separate from all other skilled services     Joint Mobilization  [place] grade III    Passive ROM  flex, abd, ER, IR to available ROM with gentle distraction osscillation for pain  relief             PT Education - 12/22/17 1540    Education provided  Yes    Education Details  progress towards goals, goal review, POC moving forward including delay for medicaid, HEP updates     Person(s) Educated  Patient    Methods  Explanation    Comprehension  Verbalized understanding;Need further instruction       PT Short Term Goals - 12/22/17 1514      PT SHORT TERM GOAL #1   Title  Pt will be independent with initial HEP    Baseline  6/25- compliant     Time  1    Period  Weeks    Status  Achieved      PT SHORT TERM GOAL #2   Title  Patient to improve PROM of L shoulder flexion and abduction  to at least 120 degrees to show improvement of shoulder mobility     Baseline  6/25- ongoing, flowsheets     Time  3    Period  Weeks    Status  On-going      PT SHORT TERM GOAL #3   Title  Patient to improve PROM of L shoulder IR and ER to at least 70 degrees in order to show improvement of shoulder mobility     Baseline  6/25- ongoing     Time  3    Period  Weeks    Status  On-going  PT SHORT TERM GOAL #4   Title  Patient to demonstrate improved upright posture at least 75% of the time in order to reduce shoulder and neck pain and to assist in improving shoulder ROM     Baseline  6/25- ongoing     Time  3    Period  Weeks    Status  On-going        PT Long Term Goals - 12/22/17 1516      PT LONG  TERM GOAL #1   Title  Patient to demonstrate L shoulder AROM as being at least 150 degrees flexion and abduction in order to show improved shoulder mobility and improved ability to perform functional tasks     Time  8    Period  Weeks    Status  On-going      PT LONG TERM GOAL #2   Title  Patient to demonstrate L shoulder AROM as being at least 80 degrees in IR and ER in order to show improved shoulder mobility and improved ability to perform self care tasks     Time  8    Period  Weeks    Status  On-going      PT LONG TERM GOAL #3   Title  Patient to report no difficulties with self-care tasks such as dressing, washing hair, and bathing in order to show improved functional use of L shoulder     Time  8    Period  Weeks    Status  On-going      PT LONG TERM GOAL #4   Title  Patient to report pain in L shoulder as being no more than 3/10 in order to improve QOL and allow him to tolerate performing more functional tasks with L shoulder     Time  8    Period  Weeks    Status  On-going            Plan - 12/22/17 1541    Clinical Impression Statement  Re-assessment performed today. Patient continues to demonstrate severe L shoulder stiffness, impaired functional use of L UE, postural deficits, and pain in L shoulder, however does show some improvement in ROM in left shoulder at this time. He will likely require extensive and long duration PT services to assist in his recovery process and promote optimal outcomes. Strongly recommend continuation of skilled PT services in order to address functional deficits, reduce pain, and attempt to improve current level of function.     Rehab Potential  Good    PT Frequency  2x / week    PT Duration  8 weeks    PT Treatment/Interventions  Dry needling;Passive range of motion;ADLs/Self Care Home Management;Cryotherapy;Electrical Stimulation;Iontophoresis 4mg /ml Dexamethasone;Moist Heat;Ultrasound;Therapeutic exercise;Therapeutic  activities;Neuromuscular re-education;Patient/family education;Manual techniques;Taping    PT Next Visit Plan  . focus on ROM, posture, soft tissue limitations due to severe impairment     PT Home Exercise Plan  pendulum, Active assist flexion. elbow flex/ext/ supine press up with cane/ supine cane exercises, flexion stretch, ABD stretch, ER stretch , shoulder isometrics. scap retractions, backwards shoulder rolls     Consulted and Agree with Plan of Care  Patient       Patient will benefit from skilled therapeutic intervention in order to improve the following deficits and impairments:  Increased fascial restricitons, Improper body mechanics, Pain, Increased muscle spasms, Postural dysfunction, Decreased range of motion, Decreased strength, Hypomobility, Impaired UE functional use, Impaired flexibility  Visit Diagnosis: Acute pain of  left shoulder - Plan: PT plan of care cert/re-cert  Stiffness of left shoulder, not elsewhere classified - Plan: PT plan of care cert/re-cert  Cramp and spasm - Plan: PT plan of care cert/re-cert  Abnormal posture - Plan: PT plan of care cert/re-cert  Muscle weakness (generalized) - Plan: PT plan of care cert/re-cert     Problem List Patient Active Problem List   Diagnosis Date Noted  . History of schizophrenia   . Infection of shoulder ([place]) 11/24/2017  . Septic joint of left shoulder region ([place]) 10/27/2017  . Adhesive capsulitis of left shoulder   . Impingement syndrome of left shoulder   . Effusion of left shoulder joint 10/10/2017  . Chest pain 10/08/2017  . Cellulitis 10/08/2017  . Sepsis with cutaneous manifestations ([place]) 07/02/2017  . GAD (generalized anxiety disorder) 06/07/2017  . Financial difficulties 06/07/2017  . Lymphedema 06/07/2017  . Gastric outlet obstruction 10/31/2016  . Chronic pain associated with significant psychosocial dysfunction 06/04/2016  . Schizophrenia, chronic condition with acute exacerbation ([place]) 09/11/2015   . Hypokalemia 01/15/2015  . Hypertension 12/03/2014  . Polysubstance abuse ([place]) 12/03/2014  . Schizoaffective disorder, bipolar type ([place]) 12/03/2014  . Back pain 08/11/2013  . Cellulitis and abscess of finger 10/10/2012  . Swollen leg 10/10/2012  . Anemia 10/10/2012    Deniece Ree PT, DPT, CBIS  Supplemental Physical Therapist [place]   Pager [facility] [address] [place], [place], [address] Phone: [phone]   Fax:  [phone]  Name: Jonathan Meadows MRN: [id number] Date of Birth: 02-21-1971

## 2017-12-22 NOTE — ED Notes (Signed)
Follow up call made  No answer  12/22/17  1057  s Demitri Kucinski rn

## 2018-01-01 ENCOUNTER — Ambulatory Visit (INDEPENDENT_AMBULATORY_CARE_PROVIDER_SITE_OTHER): Payer: Medicaid Other | Admitting: Infectious Disease

## 2018-01-01 VITALS — BP 159/119 | HR 74 | Temp 97.5°F | Ht 73.0 in | Wt 220.0 lb

## 2018-01-01 DIAGNOSIS — F209 Schizophrenia, unspecified: Secondary | ICD-10-CM

## 2018-01-01 DIAGNOSIS — Z599 Problem related to housing and economic circumstances, unspecified: Secondary | ICD-10-CM

## 2018-01-01 DIAGNOSIS — M009 Pyogenic arthritis, unspecified: Secondary | ICD-10-CM | POA: Diagnosis present

## 2018-01-01 DIAGNOSIS — Z598 Other problems related to housing and economic circumstances: Secondary | ICD-10-CM | POA: Diagnosis not present

## 2018-01-01 DIAGNOSIS — I160 Hypertensive urgency: Secondary | ICD-10-CM | POA: Diagnosis not present

## 2018-01-01 DIAGNOSIS — M869 Osteomyelitis, unspecified: Secondary | ICD-10-CM | POA: Diagnosis not present

## 2018-01-01 MED ORDER — HYDROCHLOROTHIAZIDE 25 MG PO TABS
25.0000 mg | ORAL_TABLET | Freq: Every day | ORAL | 11 refills | Status: DC
Start: 2018-01-01 — End: 2018-05-16

## 2018-01-01 MED ORDER — DOXYCYCLINE HYCLATE 100 MG PO TABS: 100.0000 mg | ORAL_TABLET | Freq: Two times a day (BID) | ORAL | 0 refills | Status: DC

## 2018-01-01 MED FILL — DOXYCYCLINE HYCLATE 100 MG: 100 | 30 days supply | Qty: 60 | Fill #0

## 2018-01-01 MED FILL — HYDROCHLOROTHIAZIDE 25 MG T: 25 | 30 days supply | Qty: 30 | Fill #0

## 2018-01-01 NOTE — Progress Notes (Signed)
Subjective:   Chief complaint: Shoulder pain   Patient ID: Jonathan Meadows, male    DOB: 07/06/1970, 47 y.o.   MRN: [id number]  HPI  Jonathan Meadows is a 47 y.o. male differentiated schizophrenia and chronic right lower extremity lymphedema of uncertain etiology presents with severe left-sided shoulder pain.  He was found to have a septic shoulder with her and 66,000 white blood cells seen on fluid analysis with 94% neutrophils.  Unfortunately had been given vancomycin in the ER to treat "cellulitis".  Gram stain showed gram-positive cocci in clusters but the cultures have been without growth.  He is status post surgery by Dr. Sharol Given who performed   ARTHROSCOPY SHOULDER, LEFT with subacromial decompression with extensive debridement of the glenohumeral joint with debridement of SLAP lesion debridement of biceps tendon and debridement of adhesions to the subscapularis.    Intraoperative cultures having been obtained as well but these are also not revealing a specific organism either.   Dr. Jess Barters intraoperative notes indicate the patient has significant amount of impingement in the shoulder.  He did not find purulence in the subacromial space.  The patient lives by himself and does not have a family member who could help him with administration of IV antibiotics.  And that his left arm is in a sling self administration seemed impractial.  Therefore we have been treating him with a highly bioavailable antibiotic that should be active against MRSA MSSA and streptococcal species namely Zyvox 600 mg twice daily.  We obtained a 4-week supply for him and he took this.  However when I saw him last he was still complaining of shoulder pain.  I obtained an MRI of his shoulder and this showed:  Interval increase in the extent and intensity of marrow edema in the humeral head with new foci of marrow edema in the glenoid worrisome for progressive osteomyelitis. New small cystic foci in the anterior aspect  of the greater tuberosity could be related to rotator cuff tendinosis or postoperative change but are worrisome for small abscesses.  Small acromioclavicular joint effusion with marrow edema in the clavicular head could be degenerative but is worrisome for septic AC joint.  Marked worsening of edema in the muscle bellies of the rotator cuff most consistent with myositis. A new focal fluid collection between the musculotendinous junctions of the supraspinatus and infraspinatus is likely an abscess.  Increased volume of fluid with septations and debris about the biceps tendon extending along the proximal humeral shaft worrisome for septic tenosynovitis.   .  Dr. Marlou Sa took him to the operating room and performed surgery on 27th of May with   Left shoulder arthroscopy with extensive intra-articular debridement and rotator interval release followed by open distal clavicle excision and open subacromial decompression with further debridement.  Placement of antibiotic beads also  performed into the joint and subacromial space.   Is not clearly why took so much time to get him admitted for surgery but there seems to been may be problems getting hold of the patient?  Unfortunately intraoperative cultures were again not helpful.  Not clear to me that he was on antecedent antibiotics but I suspect he may very well of been.  He was seen by my partner Dr. Carlyle Basques who opted to treat him again with Zyvox 600 mg twice daily along with ciprofloxacin 500 mill grams twice daily for 30 days.  The patient just recently ran out of these medications a few days ago.  He is still having some  shoulder pain though it is better than the last time I saw him.  He has been working with physical therapy.  His blood pressure is markedly elevated today in the clinic and he believes this may be in part due to the fact that he is no longer taking furosemide.  He states that his primary care physician is in  [place] which certainly seems not to be a functional place for him to have a primary care physician.  He requested pain medications from me again today and again I redirected him to have this desire for pain control monitored by his surgeon.     Past Medical History:  Diagnosis Date  . Anemia   . Cellulitis   . Homelessness   . Lymphedema of leg   . Paranoid schizophrenia ([place])    pt denies  . Septic joint of left shoulder region [facility]) 10/27/2017    Past Surgical History:  Procedure Laterality Date  . SHOULDER ARTHROSCOPY Left 10/11/2017   Procedure: ARTHROSCOPY SHOULDER, LEFT;  Surgeon: Newt Minion, MD;  Location: [place];  Service: Orthopedics;  Laterality: Left;  . SHOULDER ARTHROSCOPY WITH SUBACROMIAL DECOMPRESSION, ROTATOR CUFF REPAIR AND BICEP TENDON REPAIR Left 11/24/2017   Procedure: LEFT SHOULDER ARTHROSCOPY, OPEN DISTAL CLAVICLE EXCISION, EXTENSIVE INTRA-ARTICULAR AND EXTRA-ARTICULAR DEBRIDEMENT;  Surgeon: [name], MD;  Location: [place];  Service: Orthopedics;  Laterality: Left;    Family History  Problem Relation Age of Onset  . CAD Unknown        Neg HX  . Hypertension Unknown        Neg Hx  . Diabetes Unknown        Neg Hx  . Cancer Unknown        Neg Hx  . Lupus Mother       Social History   Socioeconomic History  . Marital status: Single    Spouse name: Not on file  . Number of children: Not on file  . Years of education: Not on file  . Highest education level: Not on file  Occupational History  . Not on file  Social Needs  . Financial resource strain: Not on file  . Food insecurity:    Worry: Not on file    Inability: Not on file  . Transportation needs:    Medical: Not on file    Non-medical: Not on file  Tobacco Use  . Smoking status: Never Smoker  . Smokeless tobacco: Never Used  Substance and Sexual Activity  . Alcohol use: No  . Drug use: No  . Sexual activity: Not on file  Lifestyle  . Physical activity:    Days  per week: Not on file    Minutes per session: Not on file  . Stress: Not on file  Relationships  . Social connections:    Talks on phone: Not on file    Gets together: Not on file    Attends religious service: Not on file    Active member of club or organization: Not on file    Attends meetings of clubs or organizations: Not on file    Relationship status: Not on file  Other Topics Concern  . Not on file  Social History Narrative  . Not on file    Allergies  Allergen Reactions  . Pork-Derived Products Nausea And Vomiting     Current Outpatient Medications:  .  ciprofloxacin (CIPRO) 500 MG tablet, Take 1 tablet (500 mg total) by mouth 2 (two) times daily.,  Disp: 60 tablet, Rfl: 0 .  docusate sodium (COLACE) 100 MG capsule, Take 1 capsule (100 mg total) by mouth 2 (two) times daily., Disp: 10 capsule, Rfl: 0 .  furosemide (LASIX) 20 MG tablet, Take 20 mg by mouth daily., Disp: , Rfl:  .  HYDROcodone-acetaminophen (NORCO/VICODIN) 5-325 MG tablet, Take 1 tablet by mouth every 8 (eight) hours as needed for moderate pain., Disp: 30 tablet, Rfl: 0 .  Menthol-Methyl Salicylate (MUSCLE RUB) 10-15 % CREA, Apply 1 application topically as needed for muscle pain., Disp: 1 Tube, Rfl: 0 .  Menthol-Methyl Salicylate (MUSCLE RUB) 10-15 % CREA, Apply 1 application topically as needed for muscle pain., Disp: 1 Tube, Rfl: 0 .  methocarbamol (ROBAXIN) 500 MG tablet, Take 1 tablet (500 mg total) by mouth every 6 (six) hours as needed for muscle spasms., Disp: 30 tablet, Rfl: 0 .  methocarbamol (ROBAXIN) 500 MG tablet, Take 1 tablet (500 mg total) by mouth every 8 (eight) hours as needed for muscle spasms., Disp: 30 tablet, Rfl: 0 .  ondansetron (ZOFRAN ODT) 4 MG disintegrating tablet, Take 1 tablet (4 mg total) by mouth every 8 (eight) hours as needed for nausea or vomiting., Disp: 20 tablet, Rfl: 0 .  oxyCODONE-acetaminophen (PERCOCET) 10-325 MG tablet, 1 po q 4-6hrs prn pain, Disp: 42 tablet, Rfl: 0 .   promethazine (PHENERGAN) 25 MG tablet, Take 1 tablet (25 mg total) by mouth every 8 (eight) hours as needed for nausea or vomiting., Disp: 8 tablet, Rfl: 0 .  traMADol (ULTRAM) 50 MG tablet, Take 1 tablet (50 mg total) by mouth every 6 (six) hours., Disp: 30 tablet, Rfl: 0     Review of Systems  Constitutional: Negative for activity change, appetite change, chills, diaphoresis, fatigue, fever and unexpected weight change.  HENT: Negative for congestion, rhinorrhea, sinus pressure, sneezing, sore throat and trouble swallowing.   Eyes: Negative for photophobia and visual disturbance.  Respiratory: Negative for cough, chest tightness, shortness of breath, wheezing and stridor.   Cardiovascular: Negative for chest pain, palpitations and leg swelling.  Gastrointestinal: Negative for abdominal distention, abdominal pain, anal bleeding, blood in stool, constipation, diarrhea, nausea and vomiting.  Genitourinary: Negative for difficulty urinating, dysuria, flank pain and hematuria.  Musculoskeletal: Positive for arthralgias and myalgias. Negative for back pain, gait problem and joint swelling.  Skin: Negative for color change, pallor, rash and wound.  Neurological: Negative for dizziness, tremors, weakness and light-headedness.  Hematological: Negative for adenopathy. Does not bruise/bleed easily.  Psychiatric/Behavioral: Positive for decreased concentration. Negative for agitation, behavioral problems, confusion, dysphoric mood and sleep disturbance.       Objective:   Physical Exam  Constitutional: He is oriented to person, place, and time. He appears well-developed and well-nourished.  HENT:  Head: Normocephalic and atraumatic.  Eyes: Conjunctivae and EOM are normal.  Neck: Normal range of motion. Neck supple.  Cardiovascular: Normal rate and regular rhythm.  Pulmonary/Chest: Effort normal. No respiratory distress. He has no wheezes.  Abdominal: Soft. He exhibits no distension.    Musculoskeletal: He exhibits no edema.       Left shoulder: He exhibits decreased range of motion and tenderness.  Neurological: He is alert and oriented to person, place, and time.  Skin: Skin is warm and dry. No rash noted. No erythema. No pallor.  Psychiatric: He has a normal mood and affect. His behavior is normal. Judgment and thought content normal.   Left shoulder: less tender    pictures of the wounds which are healing well  October 27, 2017:     Now after 2nd surgery 01/01/2018:           Assessment & Plan:   Extensive left-sided shoulder infection with septic shoulder and osteomyelitis requiring another surgery with resection of clavicle and arthroscopic debridement of the rotator cuff  I am going to give him an additional month of doxycycline to be taken to see if he has further improvement in his symptoms we will check inflammatory markers today as well and he should follow-up with his surgeon.  Hypertensive urgency: He has markedly elevated blood pressure today I am going to prescribe him hydrochlorothiazide and I am referring him to local internal medicine.  Schizophrenia: I hope he is being followed by someone for this again he needs some more coordination of his care beyond what I can accomplish in this clinic for him.  I spent greater than 25 minutes with the patient including greater than 50% of time in face to face counsel of the patient regarding the nature of shoulder infections in deep bone and joint infections and how to monitor them and treat them and regarding his need to have his blood pressure controlled his need to have a local primary care physician involved in his care and in coordination of his care.

## 2018-01-02 LAB — CBC WITH DIFFERENTIAL/PLATELET
Basophils Absolute: 30 cells/uL (ref 0–200)
Basophils Relative: 0.8 %
EOS PCT: 4.2 %
Eosinophils Absolute: 160 cells/uL (ref 15–500)
HEMATOCRIT: 34.7 % — AB (ref 38.5–50.0)
Hemoglobin: 11.7 g/dL — ABNORMAL LOW (ref 13.2–17.1)
LYMPHS ABS: 1398 {cells}/uL (ref 850–3900)
MCH: 30.5 pg (ref 27.0–33.0)
MCHC: 33.7 g/dL (ref 32.0–36.0)
MCV: 90.6 fL (ref 80.0–100.0)
MPV: 10.3 fL (ref 7.5–12.5)
Monocytes Relative: 8.4 %
NEUTROS PCT: 49.8 %
Neutro Abs: 1892 cells/uL (ref 1500–7800)
Platelets: 316 10*3/uL (ref 140–400)
RBC: 3.83 10*6/uL — ABNORMAL LOW (ref 4.20–5.80)
RDW: 16.8 % — AB (ref 11.0–15.0)
Total Lymphocyte: 36.8 %
WBC mixed population: 319 cells/uL (ref 200–950)
WBC: 3.8 10*3/uL (ref 3.8–10.8)

## 2018-01-02 LAB — BASIC METABOLIC PANEL WITH GFR
BUN: 7 mg/dL (ref 7–25)
CHLORIDE: 108 mmol/L (ref 98–110)
CO2: 25 mmol/L (ref 20–32)
Calcium: 8.8 mg/dL (ref 8.6–10.3)
Creat: 0.86 mg/dL (ref 0.60–1.35)
GFR, Est African American: 120 mL/min/{1.73_m2} (ref >=60)
GFR, Est Non African American: 103 mL/min/{1.73_m2} (ref >=60)
Glucose, Bld: 88 mg/dL (ref 65–99)
Potassium: 3.9 mmol/L (ref 3.5–5.3)
Sodium: 140 mmol/L (ref 135–146)

## 2018-01-02 LAB — C-REACTIVE PROTEIN: CRP: 1.2 mg/L (ref <8.0)

## 2018-01-02 LAB — SEDIMENTATION RATE: Sed Rate: 14 mm/h (ref 0–15)

## 2018-01-05 ENCOUNTER — Ambulatory Visit: Payer: Medicaid Other | Attending: Internal Medicine | Admitting: Physical Therapy

## 2018-01-05 ENCOUNTER — Encounter: Payer: Self-pay | Admitting: Physical Therapy

## 2018-01-05 DIAGNOSIS — M25512 Pain in left shoulder: Secondary | ICD-10-CM | POA: Insufficient documentation

## 2018-01-05 DIAGNOSIS — M6281 Muscle weakness (generalized): Secondary | ICD-10-CM | POA: Diagnosis present

## 2018-01-05 DIAGNOSIS — R293 Abnormal posture: Secondary | ICD-10-CM | POA: Insufficient documentation

## 2018-01-05 DIAGNOSIS — R252 Cramp and spasm: Secondary | ICD-10-CM | POA: Insufficient documentation

## 2018-01-05 DIAGNOSIS — M25612 Stiffness of left shoulder, not elsewhere classified: Secondary | ICD-10-CM | POA: Diagnosis present

## 2018-01-05 MED FILL — HYDROCODON-APAP 5-325: 5-325 | 7 days supply | Qty: 21 | Fill #0

## 2018-01-05 NOTE — Therapy (Addendum)
[place], [place], [address] Phone: [phone]   Fax:  [phone]  Physical Therapy Treatment  Patient Details  Name: Jonathan Meadows MRN: [id number] Date of Birth: 1970/07/13 Referring Provider: [name]    Encounter Date: 01/05/2018  PT End of Session - 01/05/18 1558    Visit Number  5    Number of Visits  20    Date for PT Re-Evaluation  01/19/18    Authorization Type  medicaid     Authorization Time Period  12/28/2017-02/07/2018    Authorization - Visit Number  1    Authorization - Number of Visits  12    PT Start Time  0345    PT Stop Time  0430    PT Time Calculation (min)  45 min       Past Medical History:  Diagnosis Date  . Anemia   . Cellulitis   . Homelessness   . Lymphedema of leg   . Paranoid schizophrenia ([place])    pt denies  . Septic joint of left shoulder region [facility]) 10/27/2017    Past Surgical History:  Procedure Laterality Date  . SHOULDER ARTHROSCOPY Left 10/11/2017   Procedure: ARTHROSCOPY SHOULDER, LEFT;  Surgeon: Newt Minion, MD;  Location: [place];  Service: Orthopedics;  Laterality: Left;  . SHOULDER ARTHROSCOPY WITH SUBACROMIAL DECOMPRESSION, ROTATOR CUFF REPAIR AND BICEP TENDON REPAIR Left 11/24/2017   Procedure: LEFT SHOULDER ARTHROSCOPY, OPEN DISTAL CLAVICLE EXCISION, EXTENSIVE INTRA-ARTICULAR AND EXTRA-ARTICULAR DEBRIDEMENT;  Surgeon: [name], MD;  Location: [place];  Service: Orthopedics;  Laterality: Left;    There were no vitals filed for this visit.  Subjective Assessment - 01/05/18 1549    Subjective  The exercises are coming along.     Currently in Pain?  Yes    Pain Score  4     Pain Location  Shoulder    Pain Orientation  Left    Pain Descriptors / Indicators  Sore;Aching    Aggravating Factors   bathing, using arm     Pain Relieving Factors  pain meds,          [facility] PT Assessment - 01/05/18 0001      AROM   Left Shoulder Flexion   110 Degrees supine                   [facility] Adult PT Treatment/Exercise - 01/05/18 0001      Shoulder Exercises: Supine   Horizontal ABduction  20 reps yellow    External Rotation  20 reps yellow    Flexion  10 reps;AROM 1 set flexed elbow    Other Supine Exercises  supine cane flexion pullovers x 20 , Er x 20, horizontal abduction        Shoulder Exercises: Seated   Other Seated Exercises  backwards shoulder rolls 1x20 (up, back, down)      Shoulder Exercises: Sidelying   External Rotation  20 reps    Other Sidelying Exercises  scapular depression x 10, then abduction x 10 flexed elbow at first      Shoulder Exercises: Isometric Strengthening   Extension  5X5" supine into table    External Rotation  5X5" manual resist    Internal Rotation  5X5" manual resist      Manual Therapy   Scapular Mobilization  side lying    Passive ROM  flex, abd, ER, IR to available ROM with gentle distraction osscillation for pain  relief             PT Education - 01/05/18 1634    Education provided  Yes    Education Details  HEP    Person(s) Educated  Patient    Methods  Explanation;Handout    Comprehension  Verbalized understanding       PT Short Term Goals - 12/22/17 1514      PT SHORT TERM GOAL #1   Title  Pt will be independent with initial HEP    Baseline  6/25- compliant     Time  1    Period  Weeks    Status  Achieved      PT SHORT TERM GOAL #2   Title  Patient to improve PROM of L shoulder flexion and abduction  to at least 120 degrees to show improvement of shoulder mobility     Baseline  6/25- ongoing, flowsheets     Time  3    Period  Weeks    Status  On-going      PT SHORT TERM GOAL #3   Title  Patient to improve PROM of L shoulder IR and ER to at least 70 degrees in order to show improvement of shoulder mobility     Baseline  6/25- ongoing     Time  3    Period  Weeks    Status  On-going      PT SHORT TERM GOAL #4   Title  Patient to  demonstrate improved upright posture at least 75% of the time in order to reduce shoulder and neck pain and to assist in improving shoulder ROM     Baseline  6/25- ongoing     Time  3    Period  Weeks    Status  On-going        PT Long Term Goals - 12/22/17 1516      PT LONG TERM GOAL #1   Title  Patient to demonstrate L shoulder AROM as being at least 150 degrees flexion and abduction in order to show improved shoulder mobility and improved ability to perform functional tasks     Time  8    Period  Weeks    Status  On-going      PT LONG TERM GOAL #2   Title  Patient to demonstrate L shoulder AROM as being at least 80 degrees in IR and ER in order to show improved shoulder mobility and improved ability to perform self care tasks     Time  8    Period  Weeks    Status  On-going      PT LONG TERM GOAL #3   Title  Patient to report no difficulties with self-care tasks such as dressing, washing hair, and bathing in order to show improved functional use of L shoulder     Time  8    Period  Weeks    Status  On-going      PT LONG TERM GOAL #4   Title  Patient to report pain in L shoulder as being no more than 3/10 in order to improve QOL and allow him to tolerate performing more functional tasks with L shoulder     Time  8    Period  Weeks    Status  On-going            Plan - 01/05/18 1618    Clinical Impression Statement  Reviewed HEP. ROM improving, Able to begin supine  theraband strengthening. Updated HEP.     PT Next Visit Plan  . focus on ROM, posture, soft tissue limitations due to severe impairment ;check supine theraband     PT Home Exercise Plan  pendulum, Active assist flexion. elbow flex/ext/ supine press up with cane/ supine cane exercises, flexion stretch, ABD stretch, ER stretch , shoulder isometrics. scap retractions, backwards shoulder rolls , horizontal abduction and ER supine with yellow band     Consulted and Agree with Plan of Care  Patient        Patient will benefit from skilled therapeutic intervention in order to improve the following deficits and impairments:  Increased fascial restricitons, Improper body mechanics, Pain, Increased muscle spasms, Postural dysfunction, Decreased range of motion, Decreased strength, Hypomobility, Impaired UE functional use, Impaired flexibility  Visit Diagnosis: Acute pain of left shoulder  Stiffness of left shoulder, not elsewhere classified  Cramp and spasm  Abnormal posture  Muscle weakness (generalized)     Problem List Patient Active Problem List   Diagnosis Date Noted  . History of schizophrenia   . Infection of shoulder ([place]) 11/24/2017  . Septic joint of left shoulder region ([place]) 10/27/2017  . Adhesive capsulitis of left shoulder   . Impingement syndrome of left shoulder   . Effusion of left shoulder joint 10/10/2017  . Chest pain 10/08/2017  . Cellulitis 10/08/2017  . Sepsis with cutaneous manifestations ([place]) 07/02/2017  . GAD (generalized anxiety disorder) 06/07/2017  . Financial difficulties 06/07/2017  . Lymphedema 06/07/2017  . Gastric outlet obstruction 10/31/2016  . Chronic pain associated with significant psychosocial dysfunction 06/04/2016  . Schizophrenia, chronic condition with acute exacerbation ([place]) 09/11/2015  . Hypokalemia 01/15/2015  . Hypertension 12/03/2014  . Polysubstance abuse ([place]) 12/03/2014  . Schizoaffective disorder, bipolar type ([place]) 12/03/2014  . Back pain 08/11/2013  . Cellulitis and abscess of finger 10/10/2012  . Swollen leg 10/10/2012  . Anemia 10/10/2012    Dorene Ar, PTA 01/05/2018, 4:34 PM  [facility] [address] [place], [place], [address] Phone: [phone]   Fax:  [phone]  Name: Jonathan Meadows MRN: [id number] Date of Birth: 11-Mar-1971

## 2018-01-06 ENCOUNTER — Ambulatory Visit (INDEPENDENT_AMBULATORY_CARE_PROVIDER_SITE_OTHER): Payer: Medicaid Other | Admitting: Orthopedic Surgery

## 2018-01-07 ENCOUNTER — Encounter: Payer: Self-pay | Admitting: Physical Therapy

## 2018-01-07 ENCOUNTER — Ambulatory Visit: Payer: Medicaid Other | Admitting: Physical Therapy

## 2018-01-07 DIAGNOSIS — M25512 Pain in left shoulder: Secondary | ICD-10-CM

## 2018-01-07 DIAGNOSIS — M25612 Stiffness of left shoulder, not elsewhere classified: Secondary | ICD-10-CM

## 2018-01-07 DIAGNOSIS — M6281 Muscle weakness (generalized): Secondary | ICD-10-CM

## 2018-01-07 DIAGNOSIS — R293 Abnormal posture: Secondary | ICD-10-CM

## 2018-01-07 DIAGNOSIS — R252 Cramp and spasm: Secondary | ICD-10-CM

## 2018-01-07 NOTE — Patient Instructions (Signed)
Reprinted all HEP at patient's request

## 2018-01-07 NOTE — Therapy (Signed)
[place], [place], [address] Phone: [phone]   Fax:  [phone]  Physical Therapy Treatment  Patient Details  Name: Jonathan Meadows MRN: [id number] Date of Birth: 09-20-1970 Referring Provider: [name]    Encounter Date: 01/07/2018  PT End of Session - 01/07/18 0911    Visit Number  6    Number of Visits  20    Date for PT Re-Evaluation  01/19/18    Authorization - Visit Number  2    Authorization - Number of Visits  12    PT Start Time  0812    PT Stop Time  0915    PT Time Calculation (min)  63 min    Activity Tolerance  Patient tolerated treatment well    Behavior During Therapy  [facility] for tasks assessed/performed       Past Medical History:  Diagnosis Date  . Anemia   . Cellulitis   . Homelessness   . Lymphedema of leg   . Paranoid schizophrenia ([place])    pt denies  . Septic joint of left shoulder region [facility]) 10/27/2017    Past Surgical History:  Procedure Laterality Date  . SHOULDER ARTHROSCOPY Left 10/11/2017   Procedure: ARTHROSCOPY SHOULDER, LEFT;  Surgeon: Newt Minion, MD;  Location: [place];  Service: Orthopedics;  Laterality: Left;  . SHOULDER ARTHROSCOPY WITH SUBACROMIAL DECOMPRESSION, ROTATOR CUFF REPAIR AND BICEP TENDON REPAIR Left 11/24/2017   Procedure: LEFT SHOULDER ARTHROSCOPY, OPEN DISTAL CLAVICLE EXCISION, EXTENSIVE INTRA-ARTICULAR AND EXTRA-ARTICULAR DEBRIDEMENT;  Surgeon: [name], MD;  Location: [place];  Service: Orthopedics;  Laterality: Left;    There were no vitals filed for this visit.  Subjective Assessment - 01/07/18 0815    Subjective  Now taking antibiotics.  Started  a couple days ago.  I need a copy of the exercises.  Pain intermittant.    Currently in Pain?  Yes    Pain Score  6     Pain Location  Shoulder    Pain Orientation  Left    Pain Descriptors / Indicators  Aching;Sore;Shooting    Pain Type  Chronic pain    Pain Radiating  Towards  into upper arm    Pain Frequency  Intermittent    Aggravating Factors   batihing using arm,  leaning on arm    Pain Relieving Factors  pain meds,  change of position    Effect of Pain on Daily Activities  sometimes wakes,  severe    Multiple Pain Sites  -- has right knee issues,  swollen  no number given pain varies.                        [place] [facility] Adult PT Treatment/Exercise - 01/07/18 0001      Self-Care   Self-Care  Posture    Posture  sitting posture improved with cues and pillow,  supine poistre improved with pec stretch      Shoulder Exercises: Supine   Horizontal ABduction  10 reps red bands  with 8/10 pain so suggested he stay with yellow    External Rotation  10 reps progressed to red band    Other Supine Exercises  cane  chest press, 10 x cues ,       Shoulder Exercises: Seated   Flexion  AAROM cane  10 X  painful    Other Seated Exercises  rolls ,stressing  posterior  Shoulder Exercises: Isometric Strengthening   Flexion  5X5"    Extension  5X5"    External Rotation  5X5"    Internal Rotation  5X5" all isomrtrics manually ressted by PTA      Moist Heat Therapy   Number Minutes Moist Heat  10 Minutes    Moist Heat Location  Shoulder      Manual Therapy   Joint Mobilization  grade II,  lll      Soft tissue mobilization  pec stretch    Passive ROM  flex, abd, ER, IR to available ROM with gentle distraction osscillation for pain  relief  and scaption             PT Education - 01/07/18 0910    Education Details  Current HEP,  exercise form,  posture    Person(s) Educated  Patient    Methods  Explanation;Handout    Comprehension  Verbalized understanding       PT Short Term Goals - 01/07/18 0913      PT SHORT TERM GOAL #1   Title  Pt will be independent with initial HEP    Time  1    Period  Weeks    Status  Achieved      PT SHORT TERM GOAL #2   Title  Patient to improve PROM of L shoulder flexion and abduction  to at  least 120 degrees to show improvement of shoulder mobility     Baseline  continues to be limited,  not formally measured    Time  3    Period  Weeks    Status  On-going      PT SHORT TERM GOAL #3   Title  Patient to improve PROM of L shoulder IR and ER to at least 70 degrees in order to show improvement of shoulder mobility     Baseline  continues to be limited not formally measured    Time  3    Period  Weeks    Status  On-going      PT SHORT TERM GOAL #4   Title  Patient to demonstrate improved upright posture at least 75% of the time in order to reduce shoulder and neck pain and to assist in improving shoulder ROM     Baseline  poor posture,  ed continued    Time  3    Period  Weeks    Status  On-going        PT Long Term Goals - 12/22/17 1516      PT LONG TERM GOAL #1   Title  Patient to demonstrate L shoulder AROM as being at least 150 degrees flexion and abduction in order to show improved shoulder mobility and improved ability to perform functional tasks     Time  8    Period  Weeks    Status  On-going      PT LONG TERM GOAL #2   Title  Patient to demonstrate L shoulder AROM as being at least 80 degrees in IR and ER in order to show improved shoulder mobility and improved ability to perform self care tasks     Time  8    Period  Weeks    Status  On-going      PT LONG TERM GOAL #3   Title  Patient to report no difficulties with self-care tasks such as dressing, washing hair, and bathing in order to show improved functional use of L shoulder  Time  8    Period  Weeks    Status  On-going      PT LONG TERM GOAL #4   Title  Patient to report pain in L shoulder as being no more than 3/10 in order to improve QOL and allow him to tolerate performing more functional tasks with L shoulder     Time  8    Period  Weeks    Status  On-going            Plan - 01/07/18 0911    Clinical Impression Statement  Patient needed copies of HEP so reprinted.  He was able to  progress to red band in supine.  PORM improving especially with AA scaption at least 110 degrees.  Pain 6/10 at end of session.     PT Next Visit Plan  . focus on ROM, posture, soft tissue limitations due to severe impairment ; continue supine theraband     PT Home Exercise Plan  pendulum, Active assist flexion. elbow flex/ext/ supine press up with cane/ supine cane exercises, flexion stretch, ABD stretch, ER stretch , shoulder isometrics. scap retractions, backwards shoulder rolls , horizontal abduction and ER supine with yellow band     Consulted and Agree with Plan of Care  Patient       Patient will benefit from skilled therapeutic intervention in order to improve the following deficits and impairments:     Visit Diagnosis: Acute pain of left shoulder  Stiffness of left shoulder, not elsewhere classified  Cramp and spasm  Abnormal posture  Muscle weakness (generalized)     Problem List Patient Active Problem List   Diagnosis Date Noted  . History of schizophrenia   . Infection of shoulder ([place]) 11/24/2017  . Septic joint of left shoulder region ([place]) 10/27/2017  . Adhesive capsulitis of left shoulder   . Impingement syndrome of left shoulder   . Effusion of left shoulder joint 10/10/2017  . Chest pain 10/08/2017  . Cellulitis 10/08/2017  . Sepsis with cutaneous manifestations ([place]) 07/02/2017  . GAD (generalized anxiety disorder) 06/07/2017  . Financial difficulties 06/07/2017  . Lymphedema 06/07/2017  . Gastric outlet obstruction 10/31/2016  . Chronic pain associated with significant psychosocial dysfunction 06/04/2016  . Schizophrenia, chronic condition with acute exacerbation ([place]) 09/11/2015  . Hypokalemia 01/15/2015  . Hypertension 12/03/2014  . Polysubstance abuse ([place]) 12/03/2014  . Schizoaffective disorder, bipolar type ([place]) 12/03/2014  . Back pain 08/11/2013  . Cellulitis and abscess of finger 10/10/2012  . Swollen leg 10/10/2012  . Anemia 10/10/2012     HARRIS,KAREN PTA 01/07/2018, 9:18 AM  [place], [place], [address] Phone: [phone]   Fax:  [phone]  Name: Jonathan Meadows MRN: [id number] Date of Birth: [birth year]

## 2018-01-12 ENCOUNTER — Ambulatory Visit: Payer: Medicaid Other | Admitting: Physical Therapy

## 2018-01-12 ENCOUNTER — Encounter: Payer: Self-pay | Admitting: Physical Therapy

## 2018-01-12 DIAGNOSIS — M25512 Pain in left shoulder: Secondary | ICD-10-CM

## 2018-01-12 DIAGNOSIS — M6281 Muscle weakness (generalized): Secondary | ICD-10-CM

## 2018-01-12 DIAGNOSIS — R252 Cramp and spasm: Secondary | ICD-10-CM

## 2018-01-12 DIAGNOSIS — R293 Abnormal posture: Secondary | ICD-10-CM

## 2018-01-12 DIAGNOSIS — M25612 Stiffness of left shoulder, not elsewhere classified: Secondary | ICD-10-CM

## 2018-01-12 NOTE — Therapy (Signed)
[place], [place], [address] Phone: [phone]   Fax:  [phone]  Physical Therapy Treatment  Patient Details  Name: Jonathan Meadows MRN: [id number] Date of Birth: 08-04-1970 Referring Provider: [name]    Encounter Date: 01/12/2018  PT End of Session - 01/12/18 0928    Visit Number  7    Number of Visits  20    Date for PT Re-Evaluation  01/19/18    Authorization Type  medicaid     Authorization Time Period  12/28/2017-02/07/2018    Authorization - Visit Number  3    Authorization - Number of Visits  12    PT Start Time  0848    PT Stop Time  0927    PT Time Calculation (min)  39 min    Activity Tolerance  Patient tolerated treatment well    Behavior During Therapy  [facility] for tasks assessed/performed       Past Medical History:  Diagnosis Date  . Anemia   . Cellulitis   . Homelessness   . Lymphedema of leg   . Paranoid schizophrenia ([place])    pt denies  . Septic joint of left shoulder region [facility]) 10/27/2017    Past Surgical History:  Procedure Laterality Date  . SHOULDER ARTHROSCOPY Left 10/11/2017   Procedure: ARTHROSCOPY SHOULDER, LEFT;  Surgeon: Newt Minion, MD;  Location: [place];  Service: Orthopedics;  Laterality: Left;  . SHOULDER ARTHROSCOPY WITH SUBACROMIAL DECOMPRESSION, ROTATOR CUFF REPAIR AND BICEP TENDON REPAIR Left 11/24/2017   Procedure: LEFT SHOULDER ARTHROSCOPY, OPEN DISTAL CLAVICLE EXCISION, EXTENSIVE INTRA-ARTICULAR AND EXTRA-ARTICULAR DEBRIDEMENT;  Surgeon: [name], MD;  Location: [place];  Service: Orthopedics;  Laterality: Left;    There were no vitals filed for this visit.  Subjective Assessment - 01/12/18 0850    Subjective  I am feeling good, no major changes since last session     Patient Stated Goals  get arm back to working properly, get pain down     Currently in Pain?  Yes    Pain Score  4     Pain Location  Shoulder    Pain Orientation  Left     Pain Descriptors / Indicators  Tightness    Pain Type  Chronic pain                       [facility] Adult PT Treatment/Exercise - 01/12/18 0001      Shoulder Exercises: Supine   Flexion  Left;AAROM;20 reps 5second holds    ABduction  AAROM;Left;20 reps 5 second holds    Other Supine Exercises  L shoulder flexion/ABD/ER/IR stretches to tolerance     Other Supine Exercises  cane  chest press, 15 x cues ,       Shoulder Exercises: Seated   Other Seated Exercises  backwards shoulder rolls 1x20 "up back down"       Shoulder Exercises: Standing   Other Standing Exercises  shoulder standing extension walks 1x10 B 5 second holds              PT Education - 01/12/18 0928    Education provided  Yes    Education Details  exercise form, ask surgeon if antibiotic beads will limit ROM     Person(s) Educated  Patient    Methods  Explanation    Comprehension  Verbalized understanding       PT Short Term Goals - 01/07/18 4034  PT SHORT TERM GOAL #1   Title  Pt will be independent with initial HEP    Time  1    Period  Weeks    Status  Achieved      PT SHORT TERM GOAL #2   Title  Patient to improve PROM of L shoulder flexion and abduction  to at least 120 degrees to show improvement of shoulder mobility     Baseline  continues to be limited,  not formally measured    Time  3    Period  Weeks    Status  On-going      PT SHORT TERM GOAL #3   Title  Patient to improve PROM of L shoulder IR and ER to at least 70 degrees in order to show improvement of shoulder mobility     Baseline  continues to be limited not formally measured    Time  3    Period  Weeks    Status  On-going      PT SHORT TERM GOAL #4   Title  Patient to demonstrate improved upright posture at least 75% of the time in order to reduce shoulder and neck pain and to assist in improving shoulder ROM     Baseline  poor posture,  ed continued    Time  3    Period  Weeks    Status  On-going         PT Long Term Goals - 12/22/17 1516      PT LONG TERM GOAL #1   Title  Patient to demonstrate L shoulder AROM as being at least 150 degrees flexion and abduction in order to show improved shoulder mobility and improved ability to perform functional tasks     Time  8    Period  Weeks    Status  On-going      PT LONG TERM GOAL #2   Title  Patient to demonstrate L shoulder AROM as being at least 80 degrees in IR and ER in order to show improved shoulder mobility and improved ability to perform self care tasks     Time  8    Period  Weeks    Status  On-going      PT LONG TERM GOAL #3   Title  Patient to report no difficulties with self-care tasks such as dressing, washing hair, and bathing in order to show improved functional use of L shoulder     Time  8    Period  Weeks    Status  On-going      PT LONG TERM GOAL #4   Title  Patient to report pain in L shoulder as being no more than 3/10 in order to improve QOL and allow him to tolerate performing more functional tasks with L shoulder     Time  8    Period  Weeks    Status  On-going            Plan - 01/12/18 2536    Clinical Impression Statement  Continued with aggressive shoulder stretching as well as AAROM and assorted ROM/functional strengthening exercises this session. Patient continues to progress slowly but steadily with skilled PT services and will continue to benefit from ongoing skilled services to address shoulder ROM and function.     Rehab Potential  Good    PT Frequency  2x / week    PT Duration  8 weeks    PT Treatment/Interventions  Dry needling;Passive range  of motion;ADLs/Self Care Home Management;Cryotherapy;Electrical Stimulation;Iontophoresis 4mg /ml Dexamethasone;Moist Heat;Ultrasound;Therapeutic exercise;Therapeutic activities;Neuromuscular re-education;Patient/family education;Manual techniques;Taping    PT Next Visit Plan  . focus on ROM, posture, soft tissue limitations due to severe impairment ;  continue supine theraband     PT Home Exercise Plan  pendulum, Active assist flexion. elbow flex/ext/ supine press up with cane/ supine cane exercises, flexion stretch, ABD stretch, ER stretch , shoulder isometrics. scap retractions, backwards shoulder rolls , horizontal abduction and ER supine with yellow band     Consulted and Agree with Plan of Care  Patient       Patient will benefit from skilled therapeutic intervention in order to improve the following deficits and impairments:  Increased fascial restricitons, Improper body mechanics, Pain, Increased muscle spasms, Postural dysfunction, Decreased range of motion, Decreased strength, Hypomobility, Impaired UE functional use, Impaired flexibility  Visit Diagnosis: Acute pain of left shoulder  Stiffness of left shoulder, not elsewhere classified  Cramp and spasm  Abnormal posture  Muscle weakness (generalized)     Problem List Patient Active Problem List   Diagnosis Date Noted  . History of schizophrenia   . Infection of shoulder ([place]) 11/24/2017  . Septic joint of left shoulder region ([place]) 10/27/2017  . Adhesive capsulitis of left shoulder   . Impingement syndrome of left shoulder   . Effusion of left shoulder joint 10/10/2017  . Chest pain 10/08/2017  . Cellulitis 10/08/2017  . Sepsis with cutaneous manifestations ([place]) 07/02/2017  . GAD (generalized anxiety disorder) 06/07/2017  . Financial difficulties 06/07/2017  . Lymphedema 06/07/2017  . Gastric outlet obstruction 10/31/2016  . Chronic pain associated with significant psychosocial dysfunction 06/04/2016  . Schizophrenia, chronic condition with acute exacerbation ([place]) 09/11/2015  . Hypokalemia 01/15/2015  . Hypertension 12/03/2014  . Polysubstance abuse ([place]) 12/03/2014  . Schizoaffective disorder, bipolar type ([place]) 12/03/2014  . Back pain 08/11/2013  . Cellulitis and abscess of finger 10/10/2012  . Swollen leg 10/10/2012  . Anemia 10/10/2012    Deniece Ree PT, DPT, CBIS  Supplemental Physical Therapist [place]   Pager [place] [address] [place], [place], [address] Phone: [phone]   Fax:  [phone]  Name: Jonathan Meadows MRN: [id number] Date of Birth: [birth year]

## 2018-01-14 ENCOUNTER — Ambulatory Visit: Payer: Medicaid Other | Admitting: Physical Therapy

## 2018-01-14 DIAGNOSIS — R293 Abnormal posture: Secondary | ICD-10-CM

## 2018-01-14 DIAGNOSIS — M6281 Muscle weakness (generalized): Secondary | ICD-10-CM

## 2018-01-14 DIAGNOSIS — M25612 Stiffness of left shoulder, not elsewhere classified: Secondary | ICD-10-CM

## 2018-01-14 DIAGNOSIS — M25512 Pain in left shoulder: Secondary | ICD-10-CM

## 2018-01-14 DIAGNOSIS — R252 Cramp and spasm: Secondary | ICD-10-CM

## 2018-01-14 NOTE — Patient Instructions (Signed)
Flexion (Eccentric) - Active-Assist (Cane)   Use unaffected arm to push affected arm forward. Avoid hiking shoulder. Keep palm relaxed. Slowly lower affected arm for 3-5 seconds, increasing use of affected arm. _10-20__ reps per set, _2__ sets per day, 7___ days per week.  Copyright  VHI. All rights reserved  Cane Exercise: Abduction   Hold cane with right hand over end, palm-up, with other hand palm-down. Move arm out from side and up by pushing with other arm. Hold __5__ seconds. Repeat _20___ times. Do ___2_ sessions per day.  http://gt2.exer.us/81   Copyright  VHI. All rights reserved.      Cane Exercise: Extension / Internal Rotation   Stand holding cane behind back with both hands palm-up. Slide cane up spine toward head. Hold __5__ seconds. Repeat ___20_ times. Do __2__ sessions per day.  http://gt2.exer.us/85   Copyright  VHI. All rights reserved.  Kasandra Knudsen Exercise: Extension   Stand holding cane behind back with both hands palm-up. Lift the cane away from body. Hold _5___ seconds. Repeat __20__ times. Do _2___ sessions per day.  http://gt2.exer.us/83   Copyright  VHI. All rights reserved.

## 2018-01-14 NOTE — Therapy (Signed)
[place], [place], [address] Phone: [phone]   Fax:  [phone]  Physical Therapy Treatment  Patient Details  Name: Jonathan Meadows MRN: [id number] Date of Birth: 1971/02/19 Referring Provider: [name]    Encounter Date: 01/14/2018  PT End of Session - 01/14/18 0831    Visit Number  8    Number of Visits  20    Date for PT Re-Evaluation  02/21/18    Authorization Type  medicaid     Authorization Time Period  12/28/2017-02/07/2018    Authorization - Visit Number  4    Authorization - Number of Visits  12    PT Start Time  0800    PT Stop Time  0845    PT Time Calculation (min)  45 min       Past Medical History:  Diagnosis Date  . Anemia   . Cellulitis   . Homelessness   . Lymphedema of leg   . Paranoid schizophrenia ([place])    pt denies  . Septic joint of left shoulder region [facility]) 10/27/2017    Past Surgical History:  Procedure Laterality Date  . SHOULDER ARTHROSCOPY Left 10/11/2017   Procedure: ARTHROSCOPY SHOULDER, LEFT;  Surgeon: Newt Minion, MD;  Location: [facility];  Service: Orthopedics;  Laterality: Left;  . SHOULDER ARTHROSCOPY WITH SUBACROMIAL DECOMPRESSION, ROTATOR CUFF REPAIR AND BICEP TENDON REPAIR Left 11/24/2017   Procedure: LEFT SHOULDER ARTHROSCOPY, OPEN DISTAL CLAVICLE EXCISION, EXTENSIVE INTRA-ARTICULAR AND EXTRA-ARTICULAR DEBRIDEMENT;  Surgeon: [name], MD;  Location: [facility];  Service: Orthopedics;  Laterality: Left;    There were no vitals filed for this visit.  Subjective Assessment - 01/14/18 0806    Subjective  Still a little limited when using left arm to shower.     Currently in Pain?  Yes    Pain Score  4     Pain Location  Shoulder    Pain Orientation  Left    Pain Descriptors / Indicators  -- a little slight pain    Aggravating Factors   lifting too much, washing back, washing under opposite arm, laying on left side     Pain Relieving Factors   change of positions, pain pills, not moving to extremes         [facility] PT Assessment - 01/14/18 0001      AROM   Left Shoulder Flexion  100 Degrees standing, 120 supine    Left Shoulder ABduction  100 Degrees standing                   [facility] Adult PT Treatment/Exercise - 01/14/18 0001      Shoulder Exercises: Supine   Horizontal ABduction  20 reps red band    External Rotation  20 reps red     Flexion  AROM;20 reps elbow extended and flexed    Other Supine Exercises  cane  chest press, 15 x cues , pullovers , Er       Shoulder Exercises: Standing   Other Standing Exercises  wall ladder flexion     Other Standing Exercises  standing cane flexion, abduction      Moist Heat Therapy   Number Minutes Moist Heat  5 Minutes    Moist Heat Location  Shoulder      Manual Therapy   Passive ROM  flex, abd, ER, IR to available ROM with gentle distraction osscillation for pain  relief  and scaption  PT Education - 01/14/18 0836    Education provided  Yes    Education Details  HEP    Person(s) Educated  Patient    Methods  Explanation;Handout    Comprehension  Verbalized understanding       PT Short Term Goals - 01/07/18 0913      PT SHORT TERM GOAL #1   Title  Pt will be independent with initial HEP    Time  1    Period  Weeks    Status  Achieved      PT SHORT TERM GOAL #2   Title  Patient to improve PROM of L shoulder flexion and abduction  to at least 120 degrees to show improvement of shoulder mobility     Baseline  continues to be limited,  not formally measured    Time  3    Period  Weeks    Status  On-going      PT SHORT TERM GOAL #3   Title  Patient to improve PROM of L shoulder IR and ER to at least 70 degrees in order to show improvement of shoulder mobility     Baseline  continues to be limited not formally measured    Time  3    Period  Weeks    Status  On-going      PT SHORT TERM GOAL #4   Title  Patient to demonstrate  improved upright posture at least 75% of the time in order to reduce shoulder and neck pain and to assist in improving shoulder ROM     Baseline  poor posture,  ed continued    Time  3    Period  Weeks    Status  On-going        PT Long Term Goals - 12/22/17 1516      PT LONG TERM GOAL #1   Title  Patient to demonstrate L shoulder AROM as being at least 150 degrees flexion and abduction in order to show improved shoulder mobility and improved ability to perform functional tasks     Time  8    Period  Weeks    Status  On-going      PT LONG TERM GOAL #2   Title  Patient to demonstrate L shoulder AROM as being at least 80 degrees in IR and ER in order to show improved shoulder mobility and improved ability to perform self care tasks     Time  8    Period  Weeks    Status  On-going      PT LONG TERM GOAL #3   Title  Patient to report no difficulties with self-care tasks such as dressing, washing hair, and bathing in order to show improved functional use of L shoulder     Time  8    Period  Weeks    Status  On-going      PT LONG TERM GOAL #4   Title  Patient to report pain in L shoulder as being no more than 3/10 in order to improve QOL and allow him to tolerate performing more functional tasks with L shoulder     Time  8    Period  Weeks    Status  On-going            Plan - 01/14/18 3474    Clinical Impression Statement  PROM and AROM improved. Began standing cane exercises in all planes. No increased pain. Progressing toward ROM goals.  PT Next Visit Plan  . focus on ROM, posture, soft tissue limitations due to severe impairment ; continue supine theraband ; review standing cane    PT Home Exercise Plan  pendulum, Active assist flexion. elbow flex/ext/ supine press up with cane/ supine cane exercises, flexion stretch, ABD stretch, ER stretch , shoulder isometrics. scap retractions, backwards shoulder rolls , horizontal abduction and ER supine with yellow band      Consulted and Agree with Plan of Care  Patient       Patient will benefit from skilled therapeutic intervention in order to improve the following deficits and impairments:  Increased fascial restricitons, Improper body mechanics, Pain, Increased muscle spasms, Postural dysfunction, Decreased range of motion, Decreased strength, Hypomobility, Impaired UE functional use, Impaired flexibility  Visit Diagnosis: Acute pain of left shoulder  Stiffness of left shoulder, not elsewhere classified  Cramp and spasm  Abnormal posture  Muscle weakness (generalized)     Problem List Patient Active Problem List   Diagnosis Date Noted  . History of schizophrenia   . Infection of shoulder ([place]) 11/24/2017  . Septic joint of left shoulder region ([place]) 10/27/2017  . Adhesive capsulitis of left shoulder   . Impingement syndrome of left shoulder   . Effusion of left shoulder joint 10/10/2017  . Chest pain 10/08/2017  . Cellulitis 10/08/2017  . Sepsis with cutaneous manifestations ([place]) 07/02/2017  . GAD (generalized anxiety disorder) 06/07/2017  . Financial difficulties 06/07/2017  . Lymphedema 06/07/2017  . Gastric outlet obstruction 10/31/2016  . Chronic pain associated with significant psychosocial dysfunction 06/04/2016  . Schizophrenia, chronic condition with acute exacerbation ([place]) 09/11/2015  . Hypokalemia 01/15/2015  . Hypertension 12/03/2014  . Polysubstance abuse ([place]) 12/03/2014  . Schizoaffective disorder, bipolar type ([place]) 12/03/2014  . Back pain 08/11/2013  . Cellulitis and abscess of finger 10/10/2012  . Swollen leg 10/10/2012  . Anemia 10/10/2012    Dorene Ar, PTA 01/14/2018, 8:51 AM  [place], [place], [address] Phone: [phone]   Fax:  [phone]  Name: Hrishikesh Hoeg MRN: [id number] Date of Birth: [birth year]

## 2018-01-19 ENCOUNTER — Ambulatory Visit: Payer: Medicaid Other | Admitting: Physical Therapy

## 2018-01-19 ENCOUNTER — Encounter: Payer: Self-pay | Admitting: Physical Therapy

## 2018-01-19 DIAGNOSIS — M6281 Muscle weakness (generalized): Secondary | ICD-10-CM

## 2018-01-19 DIAGNOSIS — M25612 Stiffness of left shoulder, not elsewhere classified: Secondary | ICD-10-CM

## 2018-01-19 DIAGNOSIS — M25512 Pain in left shoulder: Secondary | ICD-10-CM

## 2018-01-19 DIAGNOSIS — R252 Cramp and spasm: Secondary | ICD-10-CM

## 2018-01-19 DIAGNOSIS — R293 Abnormal posture: Secondary | ICD-10-CM

## 2018-01-19 NOTE — Therapy (Signed)
[place], [place], [address] Phone: [phone]   Fax:  [phone]  Physical Therapy Treatment  Patient Details  Name: Jonathan Meadows MRN: [id number] Date of Birth: April 25, 1971 Referring Provider: [name]    Encounter Date: 01/19/2018  PT End of Session - 01/19/18 0910    Visit Number  9    Number of Visits  20    Date for PT Re-Evaluation  02/21/18    Authorization Type  medicaid     Authorization Time Period  12/28/2017-02/07/2018    Authorization - Visit Number  5    Authorization - Number of Visits  12    PT Start Time  0845    PT Stop Time  0923    PT Time Calculation (min)  38 min       Past Medical History:  Diagnosis Date  . Anemia   . Cellulitis   . Homelessness   . Lymphedema of leg   . Paranoid schizophrenia ([place])    pt denies  . Septic joint of left shoulder region [facility]) 10/27/2017    Past Surgical History:  Procedure Laterality Date  . SHOULDER ARTHROSCOPY Left 10/11/2017   Procedure: ARTHROSCOPY SHOULDER, LEFT;  Surgeon: Newt Minion, MD;  Location: [place];  Service: Orthopedics;  Laterality: Left;  . SHOULDER ARTHROSCOPY WITH SUBACROMIAL DECOMPRESSION, ROTATOR CUFF REPAIR AND BICEP TENDON REPAIR Left 11/24/2017   Procedure: LEFT SHOULDER ARTHROSCOPY, OPEN DISTAL CLAVICLE EXCISION, EXTENSIVE INTRA-ARTICULAR AND EXTRA-ARTICULAR DEBRIDEMENT;  Surgeon: [name], MD;  Location: [facility];  Service: Orthopedics;  Laterality: Left;    There were no vitals filed for this visit.  Subjective Assessment - 01/19/18 0852    Subjective  Shoulder is okay. A little bit of pain. Hurts when I move it too much,     Currently in Pain?  No/denies         [facility] PT Assessment - 01/19/18 0001      AROM   Left Shoulder Flexion  -- PROM 130 supine                   [facility] Adult PT Treatment/Exercise - 01/19/18 0001      Shoulder Exercises: Supine   Horizontal ABduction   20 reps red band    External Rotation  20 reps red     Flexion  AROM;20 reps elbow extended and flexed    Other Supine Exercises  cane  chest press, 15 x cues , pullovers , Er       Shoulder Exercises: Standing   Other Standing Exercises  wall ladder flexion     Other Standing Exercises  standing cane flexion, abduction, extension, IR       Manual Therapy   Passive ROM  flex, abd, ER, IR to available ROM with gentle distraction osscillation for pain  relief  and scaption               PT Short Term Goals - 01/07/18 0913      PT SHORT TERM GOAL #1   Title  Pt will be independent with initial HEP    Time  1    Period  Weeks    Status  Achieved      PT SHORT TERM GOAL #2   Title  Patient to improve PROM of L shoulder flexion and abduction  to at least 120 degrees to show improvement of shoulder mobility     Baseline  continues  to be limited,  not formally measured    Time  3    Period  Weeks    Status  On-going      PT SHORT TERM GOAL #3   Title  Patient to improve PROM of L shoulder IR and ER to at least 70 degrees in order to show improvement of shoulder mobility     Baseline  continues to be limited not formally measured    Time  3    Period  Weeks    Status  On-going      PT SHORT TERM GOAL #4   Title  Patient to demonstrate improved upright posture at least 75% of the time in order to reduce shoulder and neck pain and to assist in improving shoulder ROM     Baseline  poor posture,  ed continued    Time  3    Period  Weeks    Status  On-going        PT Long Term Goals - 12/22/17 1516      PT LONG TERM GOAL #1   Title  Patient to demonstrate L shoulder AROM as being at least 150 degrees flexion and abduction in order to show improved shoulder mobility and improved ability to perform functional tasks     Time  8    Period  Weeks    Status  On-going      PT LONG TERM GOAL #2   Title  Patient to demonstrate L shoulder AROM as being at least 80 degrees in  IR and ER in order to show improved shoulder mobility and improved ability to perform self care tasks     Time  8    Period  Weeks    Status  On-going      PT LONG TERM GOAL #3   Title  Patient to report no difficulties with self-care tasks such as dressing, washing hair, and bathing in order to show improved functional use of L shoulder     Time  8    Period  Weeks    Status  On-going      PT LONG TERM GOAL #4   Title  Patient to report pain in L shoulder as being no more than 3/10 in order to improve QOL and allow him to tolerate performing more functional tasks with L shoulder     Time  8    Period  Weeks    Status  On-going            Plan - 01/19/18 6160    Clinical Impression Statement  Pt reports minimal compliance with HEP this week. Continued with PROM, AAROM, strengthening as tolerated. Continues to show improvement in ROM.     PT Next Visit Plan  . focus on ROM, posture, soft tissue limitations due to severe impairment ; continue supine theraband ; review standing cane, strengtening as toelrated     PT Home Exercise Plan  pendulum, Active assist flexion. elbow flex/ext/ supine press up with cane/ supine cane exercises, flexion stretch, ABD stretch, ER stretch , shoulder isometrics. scap retractions, backwards shoulder rolls , horizontal abduction and ER supine with yellow band     Consulted and Agree with Plan of Care  Patient       Patient will benefit from skilled therapeutic intervention in order to improve the following deficits and impairments:  Increased fascial restricitons, Improper body mechanics, Pain, Increased muscle spasms, Postural dysfunction, Decreased range of motion, Decreased strength,  Hypomobility, Impaired UE functional use, Impaired flexibility  Visit Diagnosis: Acute pain of left shoulder  Stiffness of left shoulder, not elsewhere classified  Cramp and spasm  Muscle weakness (generalized)  Abnormal posture     Problem List Patient  Active Problem List   Diagnosis Date Noted  . History of schizophrenia   . Infection of shoulder ([place]) 11/24/2017  . Septic joint of left shoulder region ([place]) 10/27/2017  . Adhesive capsulitis of left shoulder   . Impingement syndrome of left shoulder   . Effusion of left shoulder joint 10/10/2017  . Chest pain 10/08/2017  . Cellulitis 10/08/2017  . Sepsis with cutaneous manifestations ([place]) 07/02/2017  . GAD (generalized anxiety disorder) 06/07/2017  . Financial difficulties 06/07/2017  . Lymphedema 06/07/2017  . Gastric outlet obstruction 10/31/2016  . Chronic pain associated with significant psychosocial dysfunction 06/04/2016  . Schizophrenia, chronic condition with acute exacerbation ([place]) 09/11/2015  . Hypokalemia 01/15/2015  . Hypertension 12/03/2014  . Polysubstance abuse ([place]) 12/03/2014  . Schizoaffective disorder, bipolar type ([place]) 12/03/2014  . Back pain 08/11/2013  . Cellulitis and abscess of finger 10/10/2012  . Swollen leg 10/10/2012  . Anemia 10/10/2012    Dorene Ar, PTA 01/19/2018, 9:26 AM  [place], [place], [address] Phone: [phone]   Fax:  [phone]  Name: Andrick Rust MRN: [id number] Date of Birth: 1971-04-28

## 2018-01-20 ENCOUNTER — Ambulatory Visit (INDEPENDENT_AMBULATORY_CARE_PROVIDER_SITE_OTHER): Payer: Medicaid Other | Admitting: Orthopedic Surgery

## 2018-01-21 ENCOUNTER — Ambulatory Visit: Payer: Medicaid Other | Admitting: Physical Therapy

## 2018-01-21 ENCOUNTER — Encounter: Payer: Self-pay | Admitting: Physical Therapy

## 2018-01-21 DIAGNOSIS — M6281 Muscle weakness (generalized): Secondary | ICD-10-CM

## 2018-01-21 DIAGNOSIS — R293 Abnormal posture: Secondary | ICD-10-CM

## 2018-01-21 DIAGNOSIS — M25612 Stiffness of left shoulder, not elsewhere classified: Secondary | ICD-10-CM

## 2018-01-21 DIAGNOSIS — R252 Cramp and spasm: Secondary | ICD-10-CM

## 2018-01-21 DIAGNOSIS — M25512 Pain in left shoulder: Secondary | ICD-10-CM | POA: Diagnosis not present

## 2018-01-21 NOTE — Therapy (Signed)
[place], [place], [address] Phone: [phone]   Fax:  [phone]  Physical Therapy Treatment  Patient Details  Name: Jonathan Meadows MRN: [id number] Date of Birth: June 19, 1971 Referring Provider: [name]    Encounter Date: 01/21/2018  PT End of Session - 01/21/18 0937    Visit Number  10    Number of Visits  20    Date for PT Re-Evaluation  02/21/18    Authorization Type  medicaid     Authorization Time Period  12/28/2017-02/07/2018    Authorization - Visit Number  6    Authorization - Number of Visits  12    PT Start Time  0935    PT Stop Time  1012    PT Time Calculation (min)  37 min    Activity Tolerance  Patient tolerated treatment well    Behavior During Therapy  [facility] for tasks assessed/performed       Past Medical History:  Diagnosis Date  . Anemia   . Cellulitis   . Homelessness   . Lymphedema of leg   . Paranoid schizophrenia ([place])    pt denies  . Septic joint of left shoulder region [facility]) 10/27/2017    Past Surgical History:  Procedure Laterality Date  . SHOULDER ARTHROSCOPY Left 10/11/2017   Procedure: ARTHROSCOPY SHOULDER, LEFT;  Surgeon: Newt Minion, MD;  Location: [place];  Service: Orthopedics;  Laterality: Left;  . SHOULDER ARTHROSCOPY WITH SUBACROMIAL DECOMPRESSION, ROTATOR CUFF REPAIR AND BICEP TENDON REPAIR Left 11/24/2017   Procedure: LEFT SHOULDER ARTHROSCOPY, OPEN DISTAL CLAVICLE EXCISION, EXTENSIVE INTRA-ARTICULAR AND EXTRA-ARTICULAR DEBRIDEMENT;  Surgeon: [name], MD;  Location: [place];  Service: Orthopedics;  Laterality: Left;    There were no vitals filed for this visit.  Subjective Assessment - 01/21/18 0938    Subjective  "it's okay"      Patient Stated Goals  get arm back to working properly, get pain down     Currently in Pain?  Yes    Pain Score  4     Pain Location  Shoulder                       [facility] Adult PT  Treatment/Exercise - 01/21/18 0001      Shoulder Exercises: Supine   External Rotation  20 reps;Theraband Lt arm static    Theraband Level (Shoulder External Rotation)  Level 2 (Red)    Other Supine Exercises  rythmic stabs at 90 flx 3x30s      Shoulder Exercises: Standing   Flexion  15 reps wall slide with pillow case    Extension  15 reps    Other Standing Exercises  cane flexion, eccentric lower    Other Standing Exercises  CKC on tall table rocking      Shoulder Exercises: Pulleys   Flexion  2 minutes    Scaption  2 minutes      Manual Therapy   Passive ROM  flx, scaption, ER to tolerance               PT Short Term Goals - 01/07/18 0913      PT SHORT TERM GOAL #1   Title  Pt will be independent with initial HEP    Time  1    Period  Weeks    Status  Achieved      PT SHORT TERM GOAL #2   Title  Patient to improve  PROM of L shoulder flexion and abduction  to at least 120 degrees to show improvement of shoulder mobility     Baseline  continues to be limited,  not formally measured    Time  3    Period  Weeks    Status  On-going      PT SHORT TERM GOAL #3   Title  Patient to improve PROM of L shoulder IR and ER to at least 70 degrees in order to show improvement of shoulder mobility     Baseline  continues to be limited not formally measured    Time  3    Period  Weeks    Status  On-going      PT SHORT TERM GOAL #4   Title  Patient to demonstrate improved upright posture at least 75% of the time in order to reduce shoulder and neck pain and to assist in improving shoulder ROM     Baseline  poor posture,  ed continued    Time  3    Period  Weeks    Status  On-going        PT Long Term Goals - 12/22/17 1516      PT LONG TERM GOAL #1   Title  Patient to demonstrate L shoulder AROM as being at least 150 degrees flexion and abduction in order to show improved shoulder mobility and improved ability to perform functional tasks     Time  8    Period  Weeks     Status  On-going      PT LONG TERM GOAL #2   Title  Patient to demonstrate L shoulder AROM as being at least 80 degrees in IR and ER in order to show improved shoulder mobility and improved ability to perform self care tasks     Time  8    Period  Weeks    Status  On-going      PT LONG TERM GOAL #3   Title  Patient to report no difficulties with self-care tasks such as dressing, washing hair, and bathing in order to show improved functional use of L shoulder     Time  8    Period  Weeks    Status  On-going      PT LONG TERM GOAL #4   Title  Patient to report pain in L shoulder as being no more than 3/10 in order to improve QOL and allow him to tolerate performing more functional tasks with L shoulder     Time  8    Period  Weeks    Status  On-going            Plan - 01/21/18 1012    Clinical Impression Statement  Denied pain with exercises today. And able to perform new HEP exercises with good form. Will continue to progress as tolerated. Added CKC exercise to treatment but did not add to HEP.     PT Treatment/Interventions  Dry needling;Passive range of motion;ADLs/Self Care Home Management;Cryotherapy;Electrical Stimulation;Iontophoresis 4mg /ml Dexamethasone;Moist Heat;Ultrasound;Therapeutic exercise;Therapeutic activities;Neuromuscular re-education;Patient/family education;Manual techniques;Taping    PT Next Visit Plan  cont to progress PROM, CKC, AROM control    PT Home Exercise Plan  pendulum, Active assist flexion. elbow flex/ext/ supine press up with cane/ supine cane exercises, flexion stretch, ABD stretch, ER stretch , shoulder isometrics. scap retractions, backwards shoulder rolls , horizontal abduction and ER supine with yellow band ; bend over GHJ ext, isometric ER with step  Consulted and Agree with Plan of Care  Patient       Patient will benefit from skilled therapeutic intervention in order to improve the following deficits and impairments:  Increased fascial  restricitons, Improper body mechanics, Pain, Increased muscle spasms, Postural dysfunction, Decreased range of motion, Decreased strength, Hypomobility, Impaired UE functional use, Impaired flexibility  Visit Diagnosis: Acute pain of left shoulder  Stiffness of left shoulder, not elsewhere classified  Cramp and spasm  Muscle weakness (generalized)  Abnormal posture     Problem List Patient Active Problem List   Diagnosis Date Noted  . History of schizophrenia   . Infection of shoulder ([place]) 11/24/2017  . Septic joint of left shoulder region ([place]) 10/27/2017  . Adhesive capsulitis of left shoulder   . Impingement syndrome of left shoulder   . Effusion of left shoulder joint 10/10/2017  . Chest pain 10/08/2017  . Cellulitis 10/08/2017  . Sepsis with cutaneous manifestations ([place]) 07/02/2017  . GAD (generalized anxiety disorder) 06/07/2017  . Financial difficulties 06/07/2017  . Lymphedema 06/07/2017  . Gastric outlet obstruction 10/31/2016  . Chronic pain associated with significant psychosocial dysfunction 06/04/2016  . Schizophrenia, chronic condition with acute exacerbation ([place]) 09/11/2015  . Hypokalemia 01/15/2015  . Hypertension 12/03/2014  . Polysubstance abuse ([place]) 12/03/2014  . Schizoaffective disorder, bipolar type ([place]) 12/03/2014  . Back pain 08/11/2013  . Cellulitis and abscess of finger 10/10/2012  . Swollen leg 10/10/2012  . Anemia 10/10/2012  Aelyn Stanaland C. Abu Heavin PT, DPT 01/21/18 10:15 AM   [facility] [address] [place], [place], [address] Phone: [phone]   Fax:  [phone]  Name: Sharron Simpson MRN: [id number] Date of Birth: 06/08/1971

## 2018-01-26 ENCOUNTER — Ambulatory Visit: Payer: Medicaid Other | Admitting: Physical Therapy

## 2018-01-26 ENCOUNTER — Encounter: Payer: Self-pay | Admitting: Physical Therapy

## 2018-01-26 DIAGNOSIS — M25512 Pain in left shoulder: Secondary | ICD-10-CM | POA: Diagnosis not present

## 2018-01-26 DIAGNOSIS — M25612 Stiffness of left shoulder, not elsewhere classified: Secondary | ICD-10-CM

## 2018-01-26 DIAGNOSIS — R293 Abnormal posture: Secondary | ICD-10-CM

## 2018-01-26 DIAGNOSIS — M6281 Muscle weakness (generalized): Secondary | ICD-10-CM

## 2018-01-26 DIAGNOSIS — R252 Cramp and spasm: Secondary | ICD-10-CM

## 2018-01-26 NOTE — Therapy (Addendum)
[place], [place], [address] Phone: [phone]   Fax:  [phone]  Physical Therapy Treatment/Discharge Summary  Patient Details  Name: Jonathan Meadows MRN: [id number] Date of Birth: 1971-02-08 Referring Provider: [name]    Encounter Date: 01/26/2018  PT End of Session - 01/26/18 0820    Visit Number  11    Number of Visits  20    Date for PT Re-Evaluation  02/21/18    Authorization Type  medicaid     Authorization Time Period  12/28/2017-02/07/2018    Authorization - Visit Number  7    Authorization - Number of Visits  12    PT Start Time  0800       Past Medical History:  Diagnosis Date  . Anemia   . Cellulitis   . Homelessness   . Lymphedema of leg   . Paranoid schizophrenia ([place])    pt denies  . Septic joint of left shoulder region [facility]) 10/27/2017    Past Surgical History:  Procedure Laterality Date  . SHOULDER ARTHROSCOPY Left 10/11/2017   Procedure: ARTHROSCOPY SHOULDER, LEFT;  Surgeon: Newt Minion, MD;  Location: [place];  Service: Orthopedics;  Laterality: Left;  . SHOULDER ARTHROSCOPY WITH SUBACROMIAL DECOMPRESSION, ROTATOR CUFF REPAIR AND BICEP TENDON REPAIR Left 11/24/2017   Procedure: LEFT SHOULDER ARTHROSCOPY, OPEN DISTAL CLAVICLE EXCISION, EXTENSIVE INTRA-ARTICULAR AND EXTRA-ARTICULAR DEBRIDEMENT;  Surgeon: [name], MD;  Location: [place];  Service: Orthopedics;  Laterality: Left;    There were no vitals filed for this visit.  Subjective Assessment - 01/26/18 0803    Subjective  It's doing okay    Currently in Pain?  Yes    Pain Score  3     Pain Location  Shoulder    Pain Orientation  Left    Pain Descriptors / Indicators  -- stiffness    Pain Type  Chronic pain    Aggravating Factors   lifting, reaching behind, putting pressure on it.     Pain Relieving Factors  change positions, rest, not pushing it.          [facility] PT Assessment - 01/26/18 0001       AROM   Left Shoulder Flexion  118 Degrees    Left Shoulder Internal Rotation  -- Reach T-12    Left Shoulder External Rotation  -- Reach occiput                   [facility] Adult PT Treatment/Exercise - 01/26/18 0001      Shoulder Exercises: Supine   Horizontal ABduction  20 reps red band    External Rotation  20 reps;Theraband Lt arm static    Theraband Level (Shoulder External Rotation)  Level 2 (Red)    Other Supine Exercises  rythmic stabs at 90 flx 3x30s    Other Supine Exercises  cane  chest press, 15 x cues , pullovers , Er       Shoulder Exercises: Standing   Flexion  15 reps wall slide with pillow case    Other Standing Exercises  CKC on table rocking     Other Standing Exercises  standing cane flexion, abduction, extension, IR  eccentric control      Shoulder Exercises: Pulleys   Flexion  2 minutes    Scaption  2 minutes      Manual Therapy   Passive ROM  flx, scaption, ER to tolerance  PT Short Term Goals - 01/07/18 0913      PT SHORT TERM GOAL #1   Title  Pt will be independent with initial HEP    Time  1    Period  Weeks    Status  Achieved      PT SHORT TERM GOAL #2   Title  Patient to improve PROM of L shoulder flexion and abduction  to at least 120 degrees to show improvement of shoulder mobility     Baseline  continues to be limited,  not formally measured    Time  3    Period  Weeks    Status  On-going      PT SHORT TERM GOAL #3   Title  Patient to improve PROM of L shoulder IR and ER to at least 70 degrees in order to show improvement of shoulder mobility     Baseline  continues to be limited not formally measured    Time  3    Period  Weeks    Status  On-going      PT SHORT TERM GOAL #4   Title  Patient to demonstrate improved upright posture at least 75% of the time in order to reduce shoulder and neck pain and to assist in improving shoulder ROM     Baseline  poor posture,  ed continued    Time  3    Period   Weeks    Status  On-going        PT Long Term Goals - 12/22/17 1516      PT LONG TERM GOAL #1   Title  Patient to demonstrate L shoulder AROM as being at least 150 degrees flexion and abduction in order to show improved shoulder mobility and improved ability to perform functional tasks     Time  8    Period  Weeks    Status  On-going      PT LONG TERM GOAL #2   Title  Patient to demonstrate L shoulder AROM as being at least 80 degrees in IR and ER in order to show improved shoulder mobility and improved ability to perform self care tasks     Time  8    Period  Weeks    Status  On-going      PT LONG TERM GOAL #3   Title  Patient to report no difficulties with self-care tasks such as dressing, washing hair, and bathing in order to show improved functional use of L shoulder     Time  8    Period  Weeks    Status  On-going      PT LONG TERM GOAL #4   Title  Patient to report pain in L shoulder as being no more than 3/10 in order to improve QOL and allow him to tolerate performing more functional tasks with L shoulder     Time  8    Period  Weeks    Status  On-going            Plan - 01/26/18 0844    Clinical Impression Statement  AROM/ Functional reaching improved. Continued with eccentric control focus today with good tolerance.     PT Next Visit Plan  cont to progress PROM, CKC, AROM control    PT Home Exercise Plan  pendulum, Active assist flexion. elbow flex/ext/ supine press up with cane/ supine cane exercises, flexion stretch, ABD stretch, ER stretch , shoulder isometrics. scap retractions, backwards shoulder  rolls , horizontal abduction and ER supine with yellow band ; bend over GHJ ext, isometric ER with step    Consulted and Agree with Plan of Care  Patient       Patient will benefit from skilled therapeutic intervention in order to improve the following deficits and impairments:  Increased fascial restricitons, Improper body mechanics, Pain, Increased muscle  spasms, Postural dysfunction, Decreased range of motion, Decreased strength, Hypomobility, Impaired UE functional use, Impaired flexibility  Visit Diagnosis: Acute pain of left shoulder  Stiffness of left shoulder, not elsewhere classified  Cramp and spasm  Muscle weakness (generalized)  Abnormal posture     Problem List Patient Active Problem List   Diagnosis Date Noted  . History of schizophrenia   . Infection of shoulder ([place]) 11/24/2017  . Septic joint of left shoulder region ([place]) 10/27/2017  . Adhesive capsulitis of left shoulder   . Impingement syndrome of left shoulder   . Effusion of left shoulder joint 10/10/2017  . Chest pain 10/08/2017  . Cellulitis 10/08/2017  . Sepsis with cutaneous manifestations ([place]) 07/02/2017  . GAD (generalized anxiety disorder) 06/07/2017  . Financial difficulties 06/07/2017  . Lymphedema 06/07/2017  . Gastric outlet obstruction 10/31/2016  . Chronic pain associated with significant psychosocial dysfunction 06/04/2016  . Schizophrenia, chronic condition with acute exacerbation ([place]) 09/11/2015  . Hypokalemia 01/15/2015  . Hypertension 12/03/2014  . Polysubstance abuse ([place]) 12/03/2014  . Schizoaffective disorder, bipolar type ([place]) 12/03/2014  . Back pain 08/11/2013  . Cellulitis and abscess of finger 10/10/2012  . Swollen leg 10/10/2012  . Anemia 10/10/2012    Dorene Ar, PTA 01/26/2018, 8:45 AM  [facility] [address] [place], [place], [address] Phone: [phone]   Fax:  [phone]  Name: Jonathan Meadows MRN: [id number] Date of Birth: May 21, 1971  PHYSICAL THERAPY DISCHARGE SUMMARY  Visits from Start of Care: 11  Current functional level related to goals / functional outcomes: See above   Remaining deficits: See above   Education / Equipment: Anatomy of condition, POC, HEP, exercise form/rationale  Plan: Patient agrees to discharge.  Patient goals  were not met. Patient is being discharged due to                                                     ?????  D/C per administrative decision.    Shashwat Cleary C. Hightower PT, DPT 01/27/18 1:06 PM

## 2018-01-28 ENCOUNTER — Ambulatory Visit: Payer: Medicaid Other | Admitting: Physical Therapy

## 2018-01-29 ENCOUNTER — Telehealth (INDEPENDENT_AMBULATORY_CARE_PROVIDER_SITE_OTHER): Payer: Self-pay | Admitting: Orthopedic Surgery

## 2018-01-29 NOTE — Telephone Encounter (Signed)
Rx refill Oxycodone [facility] Pt also states that [facility] has dismissed him and he needs to get a new referral somewhere else. Patients call back #  [phone]

## 2018-01-29 NOTE — Telephone Encounter (Signed)
I called patient to explain that Dr. Marlou Sa is out of the office. Per last message in the chart from Dr. Marlou Sa, patient would be given script for hydrocodone #30 (which was filled) and then transition to OTC meds.  Call was lost or disconnected.

## 2018-01-29 NOTE — Telephone Encounter (Signed)
GD has been treating for shoulder

## 2018-02-01 ENCOUNTER — Telehealth (INDEPENDENT_AMBULATORY_CARE_PROVIDER_SITE_OTHER): Payer: Self-pay | Admitting: Orthopedic Surgery

## 2018-02-01 ENCOUNTER — Other Ambulatory Visit (INDEPENDENT_AMBULATORY_CARE_PROVIDER_SITE_OTHER): Payer: Self-pay | Admitting: Orthopedic Surgery

## 2018-02-01 MED ORDER — HYDROCODONE-ACETAMINOPHEN 5-325 MG PO TABS: 1.0000 | ORAL_TABLET | Freq: Three times a day (TID) | ORAL | 0 refills | Status: DC | PRN

## 2018-02-01 NOTE — Telephone Encounter (Signed)
Could you please see all messages below and advise since Dr. Marlou Sa is out of the office? Patient also left this message earlier today...  Patient called wanting to know when Dr. Marlou Sa or Dr. Sharol Given, he did not know which Doctor order.Occupational Therapy. He stated that he did not get the full prescription of the Hydrocodone, he only received half the amount and is now out.  I read the message to him from [facility] and the OTC medication. CB#[phone]

## 2018-02-01 NOTE — Telephone Encounter (Signed)
Called pt to advise that rx is at the front desk for pick up. He needs to use this rx sparingly. Pt wants to have a referral to another physical therapy office advised this will have to wait for Dr. Marlou Sa.

## 2018-02-01 NOTE — Telephone Encounter (Signed)
Message was sent in error

## 2018-02-01 NOTE — Telephone Encounter (Signed)
Message sent in error

## 2018-02-01 NOTE — Telephone Encounter (Signed)
rx written

## 2018-02-01 NOTE — Telephone Encounter (Signed)
Duplicate

## 2018-02-01 NOTE — Telephone Encounter (Signed)
Patient called wanting to know when Dr. Marlou Sa or Dr. Sharol Given, he did not know which Doctor order.Occupational Therapy. He stated that he did not get the full prescription of the Hydrocodone, he only received half the amount and is now out.  I read the message to him from [place] and the OTC medication. CB#[phone]

## 2018-02-01 NOTE — Telephone Encounter (Signed)
Patient called to check on pain rx refill and also new referral for therapy. Patients # [phone]

## 2018-02-01 NOTE — Telephone Encounter (Signed)
This has been done.

## 2018-02-02 ENCOUNTER — Encounter: Payer: Self-pay | Admitting: Physical Therapy

## 2018-02-02 NOTE — Telephone Encounter (Signed)
Dr Marlou Sa not likely referring him to physical therapy due to his dismissal from [facility]. Will discuss with Dr Marlou Sa when he returns.

## 2018-02-04 ENCOUNTER — Encounter: Payer: Self-pay | Admitting: Physical Therapy

## 2018-02-08 ENCOUNTER — Telehealth (INDEPENDENT_AMBULATORY_CARE_PROVIDER_SITE_OTHER): Payer: Self-pay | Admitting: Orthopedic Surgery

## 2018-02-08 NOTE — Telephone Encounter (Signed)
Patient called left VM stating he needed his physical therapy appointment canceled. Patient stated over the VM his appointment  would be canceled per provider. Patient is aware Dr.Dean is currently out of the office, he would like a referral to a new therapy office.

## 2018-02-08 NOTE — Telephone Encounter (Signed)
IC s/w patient and advised per our previous conversation that you were not intending on referring him to have PT at another facility since he had been discharged from the [facility] PT facility he was going to. Patient proceeded to get very loud on the phone stating something needed to be done, that he had to get his therapy and we keep putting him off.  I advised could not make the decision/referral without speaking to you first. I did let him know that we are limited to where we can refer him to therapy because a lot of places do not accept Medicaid insurance and also that we would likely have a difficult time trying to get him into a different [facility] PT facility since he had already been discharged. Please advise. Thanks.

## 2018-02-09 ENCOUNTER — Encounter: Payer: Self-pay | Admitting: Physical Therapy

## 2018-02-09 NOTE — Telephone Encounter (Signed)
IC s/w pt and advised per Dr Dean 

## 2018-02-09 NOTE — Telephone Encounter (Signed)
His best option will be a home exercise program.His best option will be a home exercise program.  Cannot really  Can't really do the therapy thing anymore.  Besides he is far enough out that he should be on a home exercise program by now. do the therapy thing anymore.  Besides he is far enough out that he should be on a home exercise program by now.

## 2018-02-11 ENCOUNTER — Encounter: Payer: Self-pay | Admitting: Physical Therapy

## 2018-02-12 ENCOUNTER — Ambulatory Visit (INDEPENDENT_AMBULATORY_CARE_PROVIDER_SITE_OTHER): Payer: Medicaid Other | Admitting: Infectious Diseases

## 2018-02-12 ENCOUNTER — Encounter: Payer: Self-pay | Admitting: Infectious Diseases

## 2018-02-12 DIAGNOSIS — M009 Pyogenic arthritis, unspecified: Secondary | ICD-10-CM

## 2018-02-12 DIAGNOSIS — F1910 Other psychoactive substance abuse, uncomplicated: Secondary | ICD-10-CM | POA: Diagnosis not present

## 2018-02-12 NOTE — Progress Notes (Signed)
Patient: Jonathan Meadows  DOB: 13-May-1971 MRN: [id number] PCP: Patient, No Pcp Per   Patient Active Problem List   Diagnosis Date Noted  . History of schizophrenia   . Septic joint of left shoulder region ([place]) 10/27/2017  . Adhesive capsulitis of left shoulder   . Effusion of left shoulder joint 10/10/2017  . Chest pain 10/08/2017  . GAD (generalized anxiety disorder) 06/07/2017  . Financial difficulties 06/07/2017  . Lymphedema 06/07/2017  . Gastric outlet obstruction 10/31/2016  . Chronic pain associated with significant psychosocial dysfunction 06/04/2016  . Schizophrenia, chronic condition with acute exacerbation ([place]) 09/11/2015  . Hypertension 12/03/2014  . Polysubstance abuse ([place]) 12/03/2014  . Schizoaffective disorder, bipolar type ([place]) 12/03/2014  . Back pain 08/11/2013  . Swollen leg 10/10/2012  . Anemia 10/10/2012     Subjective:   Chief Complaint  Patient presents with  . Follow-up    fluid pills/blood pressure pills    Jonathan Meadows is a 47 y.o. male here today for follow up on septic arthritis involving the left shoulder. He was taken for I&D in May 2019 with Dr. Marlou Sa this year and treated with 4 weeks of linezolid + cipro for culture negative infection and took his last dose of doxycycline yesterday to complete 8 weeks total of antibiotics for this infection. He is wondering if he needs to continue and if he should do physical therapy for this. He has some limited ROM and some stiffness/pain but overall doing well with regards to pain and healing of incision.   Review of Systems  Constitutional: Negative for chills and fever.  HENT: Negative for tinnitus.   Eyes: Negative for blurred vision and photophobia.  Respiratory: Negative for cough and sputum production.   Cardiovascular: Negative for chest pain.  Gastrointestinal: Negative for diarrhea, nausea and vomiting.  Genitourinary: Negative for dysuria.  Musculoskeletal: Positive for joint pain (left  shoulder stiffness).  Skin: Negative for rash.  Neurological: Negative for headaches.    Past Medical History:  Diagnosis Date  . Anemia   . Cellulitis   . Homelessness   . Lymphedema of leg   . Paranoid schizophrenia ([place])    pt denies  . Septic joint of left shoulder region [facility]) 10/27/2017    Outpatient Medications Prior to Visit  Medication Sig Dispense Refill  . doxycycline (VIBRA-TABS) 100 MG tablet Take 1 tablet (100 mg total) by mouth 2 (two) times daily. 60 tablet 0  . furosemide (LASIX) 20 MG tablet Take 20 mg by mouth daily.    . hydrochlorothiazide (HYDRODIURIL) 25 MG tablet Take 1 tablet (25 mg total) by mouth daily. 30 tablet 11  . Menthol-Methyl Salicylate (MUSCLE RUB) 10-15 % CREA Apply 1 application topically as needed for muscle pain. 1 Tube 0  . Menthol-Methyl Salicylate (MUSCLE RUB) 10-15 % CREA Apply 1 application topically as needed for muscle pain. 1 Tube 0  . docusate sodium (COLACE) 100 MG capsule Take 1 capsule (100 mg total) by mouth 2 (two) times daily. (Patient not taking: Reported on 02/12/2018) 10 capsule 0  . HYDROcodone-acetaminophen (NORCO/VICODIN) 5-325 MG tablet Take 1 tablet by mouth every 8 (eight) hours as needed for moderate pain. (Patient not taking: Reported on 02/12/2018) 30 tablet 0  . methocarbamol (ROBAXIN) 500 MG tablet Take 1 tablet (500 mg total) by mouth every 6 (six) hours as needed for muscle spasms. (Patient not taking: Reported on 02/12/2018) 30 tablet 0  . methocarbamol (ROBAXIN) 500 MG tablet Take 1 tablet (500 mg  total) by mouth every 8 (eight) hours as needed for muscle spasms. (Patient not taking: Reported on 02/12/2018) 30 tablet 0  . ondansetron (ZOFRAN ODT) 4 MG disintegrating tablet Take 1 tablet (4 mg total) by mouth every 8 (eight) hours as needed for nausea or vomiting. (Patient not taking: Reported on 02/12/2018) 20 tablet 0  . oxyCODONE-acetaminophen (PERCOCET) 10-325 MG tablet 1 po q 4-6hrs prn pain (Patient not taking:  Reported on 02/12/2018) 42 tablet 0  . promethazine (PHENERGAN) 25 MG tablet Take 1 tablet (25 mg total) by mouth every 8 (eight) hours as needed for nausea or vomiting. (Patient not taking: Reported on 02/12/2018) 8 tablet 0  . traMADol (ULTRAM) 50 MG tablet Take 1 tablet (50 mg total) by mouth every 6 (six) hours. (Patient not taking: Reported on 01/01/2018) 30 tablet 0   No facility-administered medications prior to visit.      Allergies  Allergen Reactions  . Pork-Derived Products Nausea And Vomiting    Social History   Tobacco Use  . Smoking status: Never Smoker  . Smokeless tobacco: Never Used  Substance Use Topics  . Alcohol use: No  . Drug use: No    Family History  Problem Relation Age of Onset  . CAD Unknown        Neg HX  . Hypertension Unknown        Neg Hx  . Diabetes Unknown        Neg Hx  . Cancer Unknown        Neg Hx  . Lupus Mother     Objective:  There were no vitals filed for this visit. There is no height or weight on file to calculate BMI.  Physical Exam  Constitutional: He is oriented to person, place, and time. He appears well-developed and well-nourished.  HENT:  Mouth/Throat: Oropharynx is clear and moist. No oral lesions. Normal dentition. No dental caries.  Eyes: No scleral icterus.  Cardiovascular: Normal rate, regular rhythm and normal heart sounds.  Pulmonary/Chest: Effort normal and breath sounds normal.  Abdominal: Soft. He exhibits no distension. There is no tenderness.  Musculoskeletal:       Left shoulder: He exhibits decreased range of motion (Limited abduction but with time and stretching can nearly achieve 90 deg. Adequate internal and external rotation of shoulder. ). He exhibits no tenderness, no deformity and normal strength.  Well healed incision   Lymphadenopathy:    He has no cervical adenopathy.  Neurological: He is alert and oriented to person, place, and time.  Skin: Skin is warm. No rash noted. He is not diaphoretic.    Psychiatric:  His behavior is a little odd today - will not keep door closed, stands and clutches bag. Keeps sunglasses on. Does not like to be touched.     Lab Results: Lab Results  Component Value Date   WBC 3.8 01/01/2018   HGB 11.7 (L) 01/01/2018   HCT 34.7 (L) 01/01/2018   MCV 90.6 01/01/2018   PLT 316 01/01/2018    Lab Results  Component Value Date   CREATININE 0.86 01/01/2018   BUN 7 01/01/2018   NA 140 01/01/2018   K 3.9 01/01/2018   CL 108 01/01/2018   CO2 25 01/01/2018    Lab Results  Component Value Date   ALT 16 10/27/2017   AST 16 10/27/2017   ALKPHOS 69 10/08/2017   BILITOT 0.3 10/27/2017     Assessment & Plan:   Problem List Items Addressed This Visit  Musculoskeletal and Integument   Septic joint of left shoulder region [facility])    He has completed 8 weeks of treatment for culture negative native septic joint. He has some limited ROM but does with some time and stretching he can achieve nearly normal ROM. I asked him to please discuss physical therapy vs. Home exercises with his surgeon. His inflammatory markers had normalized prior to initiation of doxycycline. We will stop antibiotics at this point and have him return for follow up to see Dr. Tommy Medal in 6 weeks off therapy. Precautions discussed that would be worrisome for relapsing infection.         Other   Polysubstance abuse ([place])    Hepatitis C Ab negative in April of this year. With ongoing cocaine use would benefit from repeated screening for infection.         Return in about 6 weeks (around 03/26/2018).  Janene Madeira, MSN, NP-C [facility] for Infectious [place] Pager: [phone] Office: [phone]  02/12/18  5:23 PM

## 2018-02-12 NOTE — Patient Instructions (Signed)
Stop antibiotics after today.   Will have your return in 6 weeks to see Dr. Tommy Medal again off antibiotics to see how you are doing.   I would call Dr. Randel Pigg office to see what home exercises he would like you to do to recover more function of your shoulder.

## 2018-02-12 NOTE — Assessment & Plan Note (Signed)
Hepatitis C Ab negative in April of this year. With ongoing cocaine use would benefit from repeated screening for infection.

## 2018-02-12 NOTE — Assessment & Plan Note (Signed)
He has completed 8 weeks of treatment for culture negative native septic joint. He has some limited ROM but does with some time and stretching he can achieve nearly normal ROM. I asked him to please discuss physical therapy vs. Home exercises with his surgeon. His inflammatory markers had normalized prior to initiation of doxycycline. We will stop antibiotics at this point and have him return for follow up to see Dr. Tommy Medal in 6 weeks off therapy. Precautions discussed that would be worrisome for relapsing infection.

## 2018-02-16 ENCOUNTER — Encounter: Payer: Self-pay | Admitting: Physical Therapy

## 2018-02-18 ENCOUNTER — Encounter: Payer: Self-pay | Admitting: Physical Therapy

## 2018-02-23 ENCOUNTER — Encounter: Payer: Self-pay | Admitting: Physical Therapy

## 2018-02-25 ENCOUNTER — Encounter: Payer: Self-pay | Admitting: Physical Therapy

## 2018-03-18 ENCOUNTER — Ambulatory Visit: Payer: Self-pay

## 2018-03-18 ENCOUNTER — Encounter: Payer: Self-pay | Admitting: General Practice

## 2018-03-19 ENCOUNTER — Ambulatory Visit: Payer: Self-pay | Admitting: Infectious Disease

## 2018-03-24 ENCOUNTER — Ambulatory Visit: Payer: Self-pay | Admitting: Infectious Diseases

## 2018-04-05 ENCOUNTER — Emergency Department (HOSPITAL_COMMUNITY)
Admission: EM | Admit: 2018-04-05 | Discharge: 2018-04-05 | Disposition: A | Discharge status: Home / Self Care | Payer: Medicaid Other | Attending: Emergency Medicine | Admitting: Emergency Medicine

## 2018-04-05 ENCOUNTER — Encounter (HOSPITAL_COMMUNITY): Payer: Self-pay | Admitting: Emergency Medicine

## 2018-04-05 ENCOUNTER — Emergency Department (HOSPITAL_BASED_OUTPATIENT_CLINIC_OR_DEPARTMENT_OTHER)
Admit: 2018-04-05 | Discharge: 2018-04-05 | Disposition: A | Discharge status: Home / Self Care | Payer: Medicaid Other | Attending: Emergency Medicine | Admitting: Emergency Medicine

## 2018-04-05 DIAGNOSIS — M7989 Other specified soft tissue disorders: Secondary | ICD-10-CM

## 2018-04-05 DIAGNOSIS — Z79899 Other long term (current) drug therapy: Secondary | ICD-10-CM | POA: Diagnosis not present

## 2018-04-05 DIAGNOSIS — I10 Essential (primary) hypertension: Secondary | ICD-10-CM | POA: Diagnosis not present

## 2018-04-05 DIAGNOSIS — I890 Lymphedema, not elsewhere classified: Secondary | ICD-10-CM | POA: Insufficient documentation

## 2018-04-05 DIAGNOSIS — R2241 Localized swelling, mass and lump, right lower limb: Secondary | ICD-10-CM | POA: Diagnosis present

## 2018-04-05 NOTE — ED Triage Notes (Signed)
Pt presents with multiple complaints. Reports N/V, body aches, cough, subjective fevers since Thursday. Pt highly uncooperative in triage, very defensive when asking triage questions.

## 2018-04-05 NOTE — ED Notes (Signed)
Pt returned from US

## 2018-04-05 NOTE — ED Notes (Signed)
Patient transported to Ultrasound 

## 2018-04-05 NOTE — Progress Notes (Signed)
Right lower extremity venous duplex has been completed. Negative for DVT. Results were given to Dr. Wilson Singer.  04/05/18 8:33 AM [facility] RVT

## 2018-04-11 NOTE — ED Provider Notes (Signed)
[facility] EMERGENCY DEPARTMENT Provider Note   CSN: [id number] Arrival date & time: 04/05/18  7106     History   Chief Complaint Chief Complaint  Patient presents with  . Emesis    HPI Jonathan Meadows is a 47 y.o. male.  HPI   47 year old male with numerous complaints, but primarily swelling in his right lower extremity.  He has a history of edema in his leg.  He feels like the swelling is still recently.  He reports compliance with his medications.  He does wear a support at home occasionally.  Denies acute trauma.  No fevers or chills.  No rash.  No acute respiratory complaints.  Past Medical History:  Diagnosis Date  . Anemia   . Cellulitis   . Homelessness   . Lymphedema of leg   . Paranoid schizophrenia ([place])    pt denies  . Septic joint of left shoulder region [facility]) 10/27/2017    Patient Active Problem List   Diagnosis Date Noted  . History of schizophrenia   . Septic joint of left shoulder region ([place]) 10/27/2017  . Adhesive capsulitis of left shoulder   . Effusion of left shoulder joint 10/10/2017  . Chest pain 10/08/2017  . GAD (generalized anxiety disorder) 06/07/2017  . Financial difficulties 06/07/2017  . Lymphedema 06/07/2017  . Gastric outlet obstruction 10/31/2016  . Chronic pain associated with significant psychosocial dysfunction 06/04/2016  . Schizophrenia, chronic condition with acute exacerbation ([place]) 09/11/2015  . Hypertension 12/03/2014  . Polysubstance abuse ([place]) 12/03/2014  . Schizoaffective disorder, bipolar type ([place]) 12/03/2014  . Back pain 08/11/2013  . Swollen leg 10/10/2012  . Anemia 10/10/2012    Past Surgical History:  Procedure Laterality Date  . SHOULDER ARTHROSCOPY Left 10/11/2017   Procedure: ARTHROSCOPY SHOULDER, LEFT;  Surgeon: Newt Minion, MD;  Location: [place];  Service: Orthopedics;  Laterality: Left;  . SHOULDER ARTHROSCOPY WITH SUBACROMIAL DECOMPRESSION, ROTATOR CUFF REPAIR AND BICEP TENDON REPAIR  Left 11/24/2017   Procedure: LEFT SHOULDER ARTHROSCOPY, OPEN DISTAL CLAVICLE EXCISION, EXTENSIVE INTRA-ARTICULAR AND EXTRA-ARTICULAR DEBRIDEMENT;  Surgeon: [name], MD;  Location: [place];  Service: Orthopedics;  Laterality: Left;        Home Medications    Prior to Admission medications   Medication Sig Start Date End Date Taking? Authorizing Provider  docusate sodium (COLACE) 100 MG capsule Take 1 capsule (100 mg total) by mouth 2 (two) times daily. Patient not taking: Reported on 02/12/2018 11/26/17   [name], MD  doxycycline (VIBRA-TABS) 100 MG tablet Take 1 tablet (100 mg total) by mouth 2 (two) times daily. 01/01/18   Truman Hayward, MD  furosemide (LASIX) 20 MG tablet Take 20 mg by mouth daily.    [provider]  hydrochlorothiazide (HYDRODIURIL) 25 MG tablet Take 1 tablet (25 mg total) by mouth daily. 01/01/18   Truman Hayward, MD  HYDROcodone-acetaminophen (NORCO/VICODIN) 5-325 MG tablet Take 1 tablet by mouth every 8 (eight) hours as needed for moderate pain. Patient not taking: Reported on 02/12/2018 02/01/18   Newt Minion, MD  Menthol-Methyl Salicylate (MUSCLE RUB) 10-15 % CREA Apply 1 application topically as needed for muscle pain. 11/26/17   [name], MD  Menthol-Methyl Salicylate (MUSCLE RUB) 10-15 % CREA Apply 1 application topically as needed for muscle pain. 12/02/17   [name], MD  methocarbamol (ROBAXIN) 500 MG tablet Take 1 tablet (500 mg total) by mouth every 6 (six) hours as needed for muscle  spasms. Patient not taking: Reported on 02/12/2018 11/26/17   [name], MD  methocarbamol (ROBAXIN) 500 MG tablet Take 1 tablet (500 mg total) by mouth every 8 (eight) hours as needed for muscle spasms. Patient not taking: Reported on 02/12/2018 12/02/17   [name], MD  ondansetron (ZOFRAN ODT) 4 MG disintegrating tablet Take 1 tablet (4 mg total) by mouth every 8 (eight) hours as needed for nausea or  vomiting. Patient not taking: Reported on 02/12/2018 12/06/17   Glendell Docker, NP  oxyCODONE-acetaminophen [facility]) 10-325 MG tablet 1 po q 4-6hrs prn pain Patient not taking: Reported on 02/12/2018 12/02/17   [name], MD  promethazine (PHENERGAN) 25 MG tablet Take 1 tablet (25 mg total) by mouth every 8 (eight) hours as needed for nausea or vomiting. Patient not taking: Reported on 02/12/2018 12/09/17   Charlesetta Shanks, MD  traMADol (ULTRAM) 50 MG tablet Take 1 tablet (50 mg total) by mouth every 6 (six) hours. Patient not taking: Reported on 01/01/2018 11/26/17   [name], MD    Family History Family History  Problem Relation Age of Onset  . CAD Unknown        Neg HX  . Hypertension Unknown        Neg Hx  . Diabetes Unknown        Neg Hx  . Cancer Unknown        Neg Hx  . Lupus Mother     Social History Social History   Tobacco Use  . Smoking status: Never Smoker  . Smokeless tobacco: Never Used  Substance Use Topics  . Alcohol use: No  . Drug use: No     Allergies   Pork-derived products   Review of Systems Review of Systems  All systems reviewed and negative, other than as noted in HPI.  Physical Exam Updated Vital Signs BP (!) 124/96 (BP Location: Left Arm)   Pulse 78   Temp 98.4 F (36.9 C) (Oral)   Resp 16   Ht 6\' 1"  (1.854 m)   Wt 99.8 kg   SpO2 100%   BMI 29.03 kg/m   Physical Exam  Constitutional: He appears well-developed and well-nourished. No distress.  HENT:  Head: Normocephalic and atraumatic.  Eyes: Conjunctivae are normal. Right eye exhibits no discharge. Left eye exhibits no discharge.  Neck: Neck supple.  Cardiovascular: Normal rate, regular rhythm and normal heart sounds. Exam reveals no gallop and no friction rub.  No murmur heard. Pulmonary/Chest: Effort normal and breath sounds normal. No respiratory distress.  Abdominal: Soft. He exhibits no distension. There is no tenderness.  Musculoskeletal: He exhibits  edema. He exhibits no tenderness.  Severe lymphedema of right lower extremity.  No tenderness.  No rash.  Palpable DP pulse.  Neurological: He is alert.  Skin: Skin is warm and dry.  Psychiatric: He has a normal mood and affect. His behavior is normal. Thought content normal.  Nursing note and vitals reviewed.    ED Treatments / Results  Labs (all labs ordered are listed, but only abnormal results are displayed) Labs Reviewed - No data to display  EKG None  Radiology No results found.    Procedures Procedures (including critical care time)  Medications Ordered in ED Medications - No data to display   Initial Impression / Assessment and Plan / ED Course  I have reviewed the triage vital signs and the nursing notes.  Pertinent labs & imaging results that were available during my care  of the patient were reviewed by me and considered in my medical decision making (see chart for details).     47 year old male with acute on chronic right lower extremity edema.  Ultrasound is negative for DVT.  No signs of infection.  DP pulse.  Continue meds as prescribed.  Elevation.  Compression hose. Final Clinical Impressions(s) / ED Diagnoses   Final diagnoses:  Lymphedema    ED Discharge Orders         Ordered    Compression stockings     04/05/18 0848           Virgel Manifold, MD 04/11/18 1542

## 2018-05-09 ENCOUNTER — Encounter (HOSPITAL_COMMUNITY): Payer: Self-pay

## 2018-05-09 ENCOUNTER — Other Ambulatory Visit: Payer: Self-pay

## 2018-05-09 ENCOUNTER — Emergency Department (HOSPITAL_COMMUNITY)
Admission: EM | Admit: 2018-05-09 | Discharge: 2018-05-09 | Disposition: A | Discharge status: Home / Self Care | Payer: Medicaid Other | Attending: Emergency Medicine | Admitting: Emergency Medicine

## 2018-05-09 DIAGNOSIS — R109 Unspecified abdominal pain: Secondary | ICD-10-CM | POA: Insufficient documentation

## 2018-05-09 DIAGNOSIS — R350 Frequency of micturition: Secondary | ICD-10-CM | POA: Diagnosis present

## 2018-05-09 DIAGNOSIS — F1910 Other psychoactive substance abuse, uncomplicated: Secondary | ICD-10-CM | POA: Diagnosis not present

## 2018-05-09 DIAGNOSIS — F200 Paranoid schizophrenia: Secondary | ICD-10-CM | POA: Insufficient documentation

## 2018-05-09 DIAGNOSIS — Z79899 Other long term (current) drug therapy: Secondary | ICD-10-CM | POA: Insufficient documentation

## 2018-05-09 DIAGNOSIS — F411 Generalized anxiety disorder: Secondary | ICD-10-CM | POA: Insufficient documentation

## 2018-05-09 LAB — COMPREHENSIVE METABOLIC PANEL
ALT: 20 U/L (ref 0–44)
AST: 22 U/L (ref 15–41)
Albumin: 3.7 g/dL (ref 3.5–5.0)
Alkaline Phosphatase: 74 U/L (ref 38–126)
Anion gap: 9 (ref 5–15)
BUN: 9 mg/dL (ref 6–20)
CHLORIDE: 106 mmol/L (ref 98–111)
CO2: 26 mmol/L (ref 22–32)
CREATININE: 1.15 mg/dL (ref 0.61–1.24)
Calcium: 9 mg/dL (ref 8.9–10.3)
GFR calc Af Amer: >60 mL/min (ref >60)
GFR calc non Af Amer: >60 mL/min (ref >60)
GLUCOSE: 86 mg/dL (ref 70–99)
Potassium: 3.5 mmol/L (ref 3.5–5.1)
SODIUM: 141 mmol/L (ref 135–145)
Total Bilirubin: 0.6 mg/dL (ref 0.3–1.2)
Total Protein: 8.2 g/dL — ABNORMAL HIGH (ref 6.5–8.1)

## 2018-05-09 LAB — CBC WITH DIFFERENTIAL/PLATELET
ABS IMMATURE GRANULOCYTES: 0.01 10*3/uL (ref 0.00–0.07)
BASOS PCT: 0 %
Basophils Absolute: 0 10*3/uL (ref 0.0–0.1)
Eosinophils Absolute: 0 10*3/uL (ref 0.0–0.5)
Eosinophils Relative: 1 %
HCT: 43.8 % (ref 39.0–52.0)
Hemoglobin: 14.1 g/dL (ref 13.0–17.0)
IMMATURE GRANULOCYTES: 0 %
LYMPHS ABS: 1.9 10*3/uL (ref 0.7–4.0)
LYMPHS PCT: 43 %
MCH: 30.1 pg (ref 26.0–34.0)
MCHC: 32.2 g/dL (ref 30.0–36.0)
MCV: 93.4 fL (ref 80.0–100.0)
Monocytes Absolute: 0.3 10*3/uL (ref 0.1–1.0)
Monocytes Relative: 7 %
NEUTROS ABS: 2.2 10*3/uL (ref 1.7–7.7)
NEUTROS PCT: 49 %
NRBC: 0 % (ref 0.0–0.2)
PLATELETS: 270 10*3/uL (ref 150–400)
RBC: 4.69 MIL/uL (ref 4.22–5.81)
RDW: 13.7 % (ref 11.5–15.5)
WBC: 4.5 10*3/uL (ref 4.0–10.5)

## 2018-05-09 LAB — URINALYSIS, ROUTINE W REFLEX MICROSCOPIC
Bilirubin Urine: NEGATIVE
Glucose, UA: NEGATIVE mg/dL
Hgb urine dipstick: NEGATIVE
Ketones, ur: NEGATIVE mg/dL
LEUKOCYTES UA: NEGATIVE
NITRITE: NEGATIVE
Protein, ur: NEGATIVE mg/dL
SPECIFIC GRAVITY, URINE: 1.01 (ref 1.005–1.030)
pH: 6 (ref 5.0–8.0)

## 2018-05-09 LAB — LIPASE, BLOOD: Lipase: 19 U/L (ref 11–51)

## 2018-05-09 MED ORDER — DICYCLOMINE HCL 10 MG/ML IM SOLN
20.0000 mg | Freq: Once | INTRAMUSCULAR | Status: AC
  Administered 2018-05-09: 20 mg via INTRAMUSCULAR
  Filled 2018-05-09: qty 2

## 2018-05-09 MED ORDER — ONDANSETRON 4 MG PO TBDP
4.0000 mg | ORAL_TABLET | Freq: Once | ORAL | Status: AC
  Administered 2018-05-09: 4 mg via ORAL
  Filled 2018-05-09: qty 1

## 2018-05-09 MED ORDER — DICYCLOMINE HCL 20 MG PO TABS: 20.0000 mg | ORAL_TABLET | Freq: Three times a day (TID) | ORAL | 0 refills | Status: DC | PRN

## 2018-05-09 MED ORDER — ACETAMINOPHEN 325 MG PO TABS
650.0000 mg | ORAL_TABLET | Freq: Once | ORAL | Status: AC
  Administered 2018-05-09: 650 mg via ORAL
  Filled 2018-05-09: qty 2

## 2018-05-09 NOTE — ED Notes (Signed)
Pt reminded urine sample is needed. He states he will attempt to provide urine sample.

## 2018-05-09 NOTE — ED Provider Notes (Signed)
[facility] DEPT Provider Note   CSN: [id number] Arrival date & time: 05/09/18  0750     History   Chief Complaint Chief Complaint  Patient presents with  . Urinary Frequency    HPI Jonathan Meadows is a 47 y.o. male with a hx of paranoid schizophrenia, generalized anxiety disorder, and polysubstance abuse who presents to the ED with chief complaint of urinary frequency over the past few days. Patient states he has had array of sxs including increased urinary frequency, abdominal pain, nausea, and some bouts of diarrhea. He reports that abdominal pain is aching, constant, 7/10 in severity without alleviating/aggravating factors- has not tried medications for this. He reports urinary frequency without dysuria, hematuria, penile discharge, or testicular pain/swelling. He is sexually active in a monogamous relationship and is without concern for STDs, does not use protection. He states he has had normal stools between looser stools in terms of his diarrhea. Denies vomiting, fever, melena, hematochezia, chest pain, or dyspnea.   HPI  Past Medical History:  Diagnosis Date  . Anemia   . Cellulitis   . Homelessness   . Lymphedema of leg   . Paranoid schizophrenia ([place])    pt denies  . Septic joint of left shoulder region [facility]) 10/27/2017    Patient Active Problem List   Diagnosis Date Noted  . History of schizophrenia   . Septic joint of left shoulder region ([place]) 10/27/2017  . Adhesive capsulitis of left shoulder   . Effusion of left shoulder joint 10/10/2017  . Chest pain 10/08/2017  . GAD (generalized anxiety disorder) 06/07/2017  . Financial difficulties 06/07/2017  . Lymphedema 06/07/2017  . Gastric outlet obstruction 10/31/2016  . Chronic pain associated with significant psychosocial dysfunction 06/04/2016  . Schizophrenia, chronic condition with acute exacerbation ([place]) 09/11/2015  . Hypertension 12/03/2014  . Polysubstance abuse ([place])  12/03/2014  . Schizoaffective disorder, bipolar type ([place]) 12/03/2014  . Back pain 08/11/2013  . Swollen leg 10/10/2012  . Anemia 10/10/2012    Past Surgical History:  Procedure Laterality Date  . SHOULDER ARTHROSCOPY Left 10/11/2017   Procedure: ARTHROSCOPY SHOULDER, LEFT;  Surgeon: Newt Minion, MD;  Location: [place];  Service: Orthopedics;  Laterality: Left;  . SHOULDER ARTHROSCOPY WITH SUBACROMIAL DECOMPRESSION, ROTATOR CUFF REPAIR AND BICEP TENDON REPAIR Left 11/24/2017   Procedure: LEFT SHOULDER ARTHROSCOPY, OPEN DISTAL CLAVICLE EXCISION, EXTENSIVE INTRA-ARTICULAR AND EXTRA-ARTICULAR DEBRIDEMENT;  Surgeon: [name], MD;  Location: [place];  Service: Orthopedics;  Laterality: Left;        Home Medications    Prior to Admission medications   Medication Sig Start Date End Date Taking? Authorizing Provider  docusate sodium (COLACE) 100 MG capsule Take 1 capsule (100 mg total) by mouth 2 (two) times daily. Patient not taking: Reported on 02/12/2018 11/26/17   [name], MD  doxycycline (VIBRA-TABS) 100 MG tablet Take 1 tablet (100 mg total) by mouth 2 (two) times daily. 01/01/18   Truman Hayward, MD  furosemide (LASIX) 20 MG tablet Take 20 mg by mouth daily.    [provider]  hydrochlorothiazide (HYDRODIURIL) 25 MG tablet Take 1 tablet (25 mg total) by mouth daily. 01/01/18   Truman Hayward, MD  HYDROcodone-acetaminophen (NORCO/VICODIN) 5-325 MG tablet Take 1 tablet by mouth every 8 (eight) hours as needed for moderate pain. Patient not taking: Reported on 02/12/2018 02/01/18   Newt Minion, MD  Menthol-Methyl Salicylate (MUSCLE RUB) 10-15 % CREA Apply 1 application topically as needed  for muscle pain. 11/26/17   [name], MD  Menthol-Methyl Salicylate (MUSCLE RUB) 10-15 % CREA Apply 1 application topically as needed for muscle pain. 12/02/17   [name], MD  methocarbamol (ROBAXIN) 500 MG tablet Take 1 tablet (500 mg total) by mouth  every 6 (six) hours as needed for muscle spasms. Patient not taking: Reported on 02/12/2018 11/26/17   [name], MD  methocarbamol (ROBAXIN) 500 MG tablet Take 1 tablet (500 mg total) by mouth every 8 (eight) hours as needed for muscle spasms. Patient not taking: Reported on 02/12/2018 12/02/17   [name], MD  ondansetron (ZOFRAN ODT) 4 MG disintegrating tablet Take 1 tablet (4 mg total) by mouth every 8 (eight) hours as needed for nausea or vomiting. Patient not taking: Reported on 02/12/2018 12/06/17   Glendell Docker, NP  oxyCODONE-acetaminophen [facility]) 10-325 MG tablet 1 po q 4-6hrs prn pain Patient not taking: Reported on 02/12/2018 12/02/17   [name], MD  promethazine (PHENERGAN) 25 MG tablet Take 1 tablet (25 mg total) by mouth every 8 (eight) hours as needed for nausea or vomiting. Patient not taking: Reported on 02/12/2018 12/09/17   Charlesetta Shanks, MD  traMADol (ULTRAM) 50 MG tablet Take 1 tablet (50 mg total) by mouth every 6 (six) hours. Patient not taking: Reported on 01/01/2018 11/26/17   [name], MD    Family History Family History  Problem Relation Age of Onset  . CAD Unknown        Neg HX  . Hypertension Unknown        Neg Hx  . Diabetes Unknown        Neg Hx  . Cancer Unknown        Neg Hx  . Lupus Mother     Social History Social History   Tobacco Use  . Smoking status: Never Smoker  . Smokeless tobacco: Never Used  Substance Use Topics  . Alcohol use: No  . Drug use: No     Allergies   Pork-derived products   Review of Systems Review of Systems  Constitutional: Negative for chills and fever.  Respiratory: Negative for shortness of breath.   Cardiovascular: Negative for chest pain.  Gastrointestinal: Positive for abdominal pain, diarrhea and nausea. Negative for blood in stool, constipation and vomiting.  Genitourinary: Positive for frequency. Negative for dysuria, flank pain, hematuria, penile swelling, scrotal  swelling, testicular pain and urgency.  All other systems reviewed and are negative.    Physical Exam Updated Vital Signs BP (!) 141/99 (BP Location: Left Arm)   Pulse 78   Temp 97.6 F (36.4 C) (Oral)   Resp 17   Ht 6\' 1"  (1.854 m)   Wt 99 kg   SpO2 97%   BMI 28.80 kg/m   Physical Exam  Constitutional: He appears well-developed and well-nourished.  Non-toxic appearance. No distress.  HENT:  Head: Normocephalic and atraumatic.  Eyes: Conjunctivae are normal. Right eye exhibits no discharge. Left eye exhibits no discharge.  Neck: Neck supple.  Cardiovascular: Normal rate and regular rhythm.  Pulmonary/Chest: Effort normal and breath sounds normal. No respiratory distress. He has no wheezes. He has no rhonchi. He has no rales.  Respiration even and unlabored  Abdominal: Soft. He exhibits no distension. There is no rigidity, no rebound, no guarding, no CVA tenderness, no tenderness at McBurney's point and negative Murphy's sign.  When distracted patient with nontender abdomen without peritoneal signs.   Neurological: He is alert.  Clear speech.   Skin: Skin is warm and dry. No rash noted.  Psychiatric: He has a normal mood and affect. His behavior is normal.  Nursing note and vitals reviewed.    ED Treatments / Results  Labs (all labs ordered are listed, but only abnormal results are displayed) Labs Reviewed  COMPREHENSIVE METABOLIC PANEL - Abnormal; Notable for the following components:      Result Value   Total Protein 8.2 (*)    All other components within normal limits  URINALYSIS, ROUTINE W REFLEX MICROSCOPIC  CBC WITH DIFFERENTIAL/PLATELET  LIPASE, BLOOD  GC/CHLAMYDIA PROBE AMP ([place]) NOT AT [facility]    EKG None  Radiology No results found.  Procedures Procedures (including critical care time)  Medications Ordered in ED Medications  ondansetron (ZOFRAN-ODT) disintegrating tablet 4 mg (4 mg Oral Given 05/09/18 0943)  dicyclomine (BENTYL) injection  20 mg (20 mg Intramuscular Given 05/09/18 0943)  acetaminophen (TYLENOL) tablet 650 mg (650 mg Oral Given 05/09/18 0942)     Initial Impression / Assessment and Plan / ED Course  I have reviewed the triage vital signs and the nursing notes.  Pertinent labs & imaging results that were available during my care of the patient were reviewed by me and considered in my medical decision making (see chart for details).    Patient presents to the ED with complaints of urinary frequency, nausea, abdominal pain, and diarrhea. Patient nontoxic appearing, in no apparent distress, vitals WNL other than elevated BP- doubt HTN emergency. On exam nontender w/ distraction, no peritoneal signs. Will evaluate with labs. Bentyl, tylenol, and zofran ordered for symptomatic relief.   Labs reviewed and grossly unremarkable. No leukocytosis, no anemia, no significant electrolyte derangements. LFTs, renal function, and lipase WNL. Urinalysis without infection or hematuria to raise concern for UTI, pyelo, or nephrolithiasis. No hyperglycemia or urinary findings to raise concern for DM as underlying cause of urinary frequency. GC/chlamydia cultures added on, patient without concern for STD therefore do not feel prophylaxis is necessary at this time. On repeat abdominal exam patient remains without peritoneal signs, doubt cholecystitis, pancreatitis, diverticulitis, appendicitis, or bowel obstruction/perforation. Unclear definitve etiology to patient's sxs, work-up in the ED overall re-assuring, improved and tolerating PO following meds here and is requesting discharge. Will discharge home with supportive measures. I discussed results, treatment plan, need for PCP follow-up, and return precautions with the patient. Provided opportunity for questions, patient confirmed understanding and is in agreement with plan.    Final Clinical Impressions(s) / ED Diagnoses   Final diagnoses:  Abdominal pain, unspecified abdominal location    Urinary frequency    ED Discharge Orders         Ordered    dicyclomine (BENTYL) 20 MG tablet  Every 8 hours PRN     05/09/18 1158           Leanord Thibeau, [place] R, PA-C 05/09/18 1202    Dorie Rank, MD 05/09/18 1420

## 2018-05-09 NOTE — ED Triage Notes (Signed)
Patient comes form home, patient arrived via POV. Patient is AOx4 and ambulatory. Patient states he has been having to urinate more often than usual and that this has been going on for the last several days.   Patient is acting odd, not making eye contact, slurring some words. Patient is not homeless patient states.

## 2018-05-09 NOTE — ED Notes (Signed)
Pt reminded of need for urine specimen 

## 2018-05-09 NOTE — Discharge Instructions (Signed)
You are seen in the emergency department today for abdominal pain, diarrhea, and increased urinary frequency.  Your work-up in the emergency department was reassuring.  Your urine does not appear infected.  Your lab work was all fairly normal.  We are unsure of the exact cause of your symptoms.  We are sending you home with medication to help treat this, please take Bentyl every 8 hours as needed for abdominal cramping/pain.  You may also take Tylenol per over-the-counter dosing to help with this as well.  We have prescribed you new medication(s) today. Discuss the medications prescribed today with your pharmacist as they can have adverse effects and interactions with your other medicines including over the counter and prescribed medications. Seek medical evaluation if you start to experience new or abnormal symptoms after taking one of these medicines, seek care immediately if you start to experience difficulty breathing, feeling of your throat closing, facial swelling, or rash as these could be indications of a more serious allergic reaction  Please follow-up with your primary care provider for a recheck of symptoms within 3 to 5 days.  Return to the ER for new or worsening symptoms or any other concerns.

## 2018-05-09 NOTE — ED Notes (Signed)
Pt cannot use restroom at this time, aware urine specimen is needed. Urinal provided  

## 2018-05-13 ENCOUNTER — Encounter (HOSPITAL_COMMUNITY): Payer: Self-pay | Admitting: *Deleted

## 2018-05-13 ENCOUNTER — Emergency Department (HOSPITAL_COMMUNITY)
Admission: EM | Admit: 2018-05-13 | Discharge: 2018-05-13 | Disposition: A | Discharge status: Home / Self Care | Payer: Medicaid Other | Attending: Emergency Medicine | Admitting: Emergency Medicine

## 2018-05-13 ENCOUNTER — Other Ambulatory Visit: Payer: Self-pay

## 2018-05-13 DIAGNOSIS — Z79899 Other long term (current) drug therapy: Secondary | ICD-10-CM | POA: Diagnosis not present

## 2018-05-13 DIAGNOSIS — I10 Essential (primary) hypertension: Secondary | ICD-10-CM | POA: Diagnosis not present

## 2018-05-13 DIAGNOSIS — I890 Lymphedema, not elsewhere classified: Secondary | ICD-10-CM | POA: Diagnosis not present

## 2018-05-13 MED ORDER — OXYCODONE-ACETAMINOPHEN 5-325 MG PO TABS: 2.0000 | ORAL_TABLET | Freq: Once | ORAL | Status: DC

## 2018-05-13 NOTE — ED Provider Notes (Signed)
[facility] EMERGENCY DEPARTMENT Provider Note   CSN: [id number] Arrival date & time: 05/13/18  0747     History   Chief Complaint Chief Complaint  Patient presents with  . Leg Pain    HPI Jonathan Meadows is a 47 y.o. male.  HPI Patient has known history of chronic lymphedema.  He reports that the leg has been giving him more problems recently.  He reports it seems more swollen and more painful than usual.  This is been occurring over several days duration.  There has not been any specific injury.  No new wounds have developed.  He reports he actually has healed a wound that was on the lower ankle previously.  He does have a compression sock that he wears.  He reports however it is wearing out and he needs a new one.  He also reports he used to have sequential compression devices and he needs a replacement for that.  He states he used to get treated in [place] has moved to the [place] area and has not been able to get established with a primary care physician yet and has not had specialty follow-up for the lymphedema for a while.  He is looking to get reestablished with ongoing care. Past Medical History:  Diagnosis Date  . Anemia   . Cellulitis   . Homelessness   . Lymphedema of leg   . Paranoid schizophrenia ([place])    pt denies  . Septic joint of left shoulder region [facility]) 10/27/2017    Patient Active Problem List   Diagnosis Date Noted  . History of schizophrenia   . Septic joint of left shoulder region ([place]) 10/27/2017  . Adhesive capsulitis of left shoulder   . Effusion of left shoulder joint 10/10/2017  . Chest pain 10/08/2017  . GAD (generalized anxiety disorder) 06/07/2017  . Financial difficulties 06/07/2017  . Lymphedema 06/07/2017  . Gastric outlet obstruction 10/31/2016  . Chronic pain associated with significant psychosocial dysfunction 06/04/2016  . Schizophrenia, chronic condition with acute exacerbation ([place]) 09/11/2015  . Hypertension  12/03/2014  . Polysubstance abuse ([place]) 12/03/2014  . Schizoaffective disorder, bipolar type ([place]) 12/03/2014  . Back pain 08/11/2013  . Swollen leg 10/10/2012  . Anemia 10/10/2012    Past Surgical History:  Procedure Laterality Date  . SHOULDER ARTHROSCOPY Left 10/11/2017   Procedure: ARTHROSCOPY SHOULDER, LEFT;  Surgeon: Newt Minion, MD;  Location: [facility];  Service: Orthopedics;  Laterality: Left;  . SHOULDER ARTHROSCOPY WITH SUBACROMIAL DECOMPRESSION, ROTATOR CUFF REPAIR AND BICEP TENDON REPAIR Left 11/24/2017   Procedure: LEFT SHOULDER ARTHROSCOPY, OPEN DISTAL CLAVICLE EXCISION, EXTENSIVE INTRA-ARTICULAR AND EXTRA-ARTICULAR DEBRIDEMENT;  Surgeon: [name], MD;  Location: [facility];  Service: Orthopedics;  Laterality: Left;        Home Medications    Prior to Admission medications   Medication Sig Start Date End Date Taking? Authorizing Provider  dicyclomine (BENTYL) 20 MG tablet Take 1 tablet (20 mg total) by mouth every 8 (eight) hours as needed for spasms (cramping). 05/09/18   Petrucelli, Samantha R, PA-C  furosemide (LASIX) 20 MG tablet Take 20 mg by mouth daily.    [provider]  hydrochlorothiazide (HYDRODIURIL) 25 MG tablet Take 1 tablet (25 mg total) by mouth daily. 01/01/18   Truman Hayward, MD  Menthol-Methyl Salicylate (MUSCLE RUB) 10-15 % CREA Apply 1 application topically as needed for muscle pain. 11/26/17   [name], MD  potassium chloride (K-DUR,KLOR-CON) 10 MEQ tablet Take 10  mEq by mouth daily. 10/20/17   [provider]    Family History Family History  Problem Relation Age of Onset  . CAD Unknown        Neg HX  . Hypertension Unknown        Neg Hx  . Diabetes Unknown        Neg Hx  . Cancer Unknown        Neg Hx  . Lupus Mother     Social History Social History   Tobacco Use  . Smoking status: Never Smoker  . Smokeless tobacco: Never Used  Substance Use Topics  . Alcohol use: No  . Drug use: No      Allergies   Pork-derived products   Review of Systems Review of Systems 10 Systems reviewed and are negative for acute change except as noted in the HPI.   Physical Exam Updated Vital Signs BP (!) 139/103 (BP Location: Right Arm)   Pulse 68   Temp 97.6 F (36.4 C) (Oral)   Resp 16   Ht 6\' 1"  (1.854 m)   Wt 90.7 kg   SpO2 100%   BMI 26.39 kg/m   Physical Exam  Constitutional: He is oriented to person, place, and time.  Patient is alert and nontoxic.  He is up and ambulating in the room with nonantalgic gait.  No respiratory distress.  Normal mental status.  HENT:  Head: Normocephalic and atraumatic.  Eyes: EOM are normal.  Cardiovascular: Normal rate, regular rhythm, normal heart sounds and intact distal pulses.  Pulmonary/Chest: Effort normal and breath sounds normal.  Abdominal: Soft. He exhibits no distension. There is no tenderness. There is no guarding.  No mass or fullness in the inguinal canal.  Musculoskeletal:  Patient has diffuse lymphedema of the right lower extremity.  This goes from the thigh to the foot.  The leg is about 50% larger than the left lower extremity which is normal.  There are no specific areas of warmth, erythema or active wounds.  The lower leg and the feet are with intact and non-erythematous skin surfaces.  There is some thinning and scaling of the skin of the lower leg but this is not severe.  Foot has no ulcerative wounds.  Patient ambulates with nonantalgic gait.  Dorsalis pedis pulses are brisk with hand-held Doppler.  Pulses confirmed at the right inguinal and right DP location.  The foot is well-perfused.  Neurological: He is alert and oriented to person, place, and time. He exhibits normal muscle tone. Coordination normal.  Skin: Skin is warm and dry.  Psychiatric: He has a normal mood and affect.     ED Treatments / Results  Labs (all labs ordered are listed, but only abnormal results are displayed) Labs Reviewed - No data to  display  EKG None  Radiology No results found.  Procedures Procedures (including critical care time)  Medications Ordered in ED Medications  oxyCODONE-acetaminophen (PERCOCET/ROXICET) 5-325 MG per tablet 2 tablet (has no administration in time range)     Initial Impression / Assessment and Plan / ED Course  I have reviewed the triage vital signs and the nursing notes.  Pertinent labs & imaging results that were available during my care of the patient were reviewed by me and considered in my medical decision making (see chart for details).    EMR reviewed.  Patient has had multiple ultrasounds to rule out DVT.  At this time have low suspicion for DVT as the etiology of the  patient's symptoms.  This has been long-standing and chronic.  No evidence of arterial obstruction.  Patient does not have any active ulcers or wounds.  I believe he would benefit from management with wound care for chronic lymphedema and consultation with vascular surgery.  I do not identify any acutely emergent changes at this time.  Patient is walking well and otherwise appears to be in no distress.  He did advise he wanted something for pain and has taken Percocet in the past.  He is given 1 dose in the emergency department but advised I cannot prescribe for what is a chronic condition.  He is counseled to get follow-up with the PCP as soon as possible.  I have written an ambulatory referral for wound care and vascular surgery consultations.  Final Clinical Impressions(s) / ED Diagnoses   Final diagnoses:  Lymphedema    ED Discharge Orders         Ordered    Ambulatory referral to Wound Clinic     05/13/18 0946    Ambulatory referral to Vascular Surgery     05/13/18 0946           Charlesetta Shanks, MD 05/13/18 [phone]

## 2018-05-13 NOTE — ED Triage Notes (Signed)
Pt c/o R leg pain x 2 -3 days, denies injury, denies swelling to the area, A&O x4, pt ambulatory

## 2018-05-13 NOTE — Discharge Instructions (Signed)
1.  Continue to use your compression hose until you can get a new that.  Continue to elevate your extremity as much as possible. 2.  Work on getting established with a primary care provider.  Try to establish a follow-up appointment with wound care clinic.

## 2018-05-14 ENCOUNTER — Encounter (HOSPITAL_COMMUNITY): Payer: Self-pay

## 2018-05-14 ENCOUNTER — Other Ambulatory Visit: Payer: Self-pay

## 2018-05-14 ENCOUNTER — Emergency Department (HOSPITAL_COMMUNITY)
Admission: EM | Admit: 2018-05-14 | Discharge: 2018-05-14 | Disposition: A | Discharge status: Home / Self Care | Payer: Medicaid Other | Attending: Emergency Medicine | Admitting: Emergency Medicine

## 2018-05-14 DIAGNOSIS — M545 Low back pain, unspecified: Secondary | ICD-10-CM

## 2018-05-14 DIAGNOSIS — Z79899 Other long term (current) drug therapy: Secondary | ICD-10-CM | POA: Diagnosis not present

## 2018-05-14 DIAGNOSIS — I10 Essential (primary) hypertension: Secondary | ICD-10-CM | POA: Diagnosis not present

## 2018-05-14 MED ORDER — ACETAMINOPHEN 325 MG PO TABS
650.0000 mg | ORAL_TABLET | Freq: Once | ORAL | Status: AC
  Administered 2018-05-14: 650 mg via ORAL
  Filled 2018-05-14: qty 2

## 2018-05-14 NOTE — ED Provider Notes (Signed)
[facility] DEPT Provider Note   CSN: [id number] Arrival date & time: 05/14/18  0749     History   Chief Complaint Chief Complaint  Patient presents with  . Back Pain    HPI Jonathan Meadows is a 47 y.o. male.  HPI 47 year old male presents the emergency department after waking up with mid low back pain today.  Denies abdominal pain.  No fevers or chills.  Denies diarrhea.  Was seen yesterday in the ER for chronic lymphedema of his lower extremity.  Patient reports he has not tried any medication at home for his symptoms.  He is requesting something for pain here in the emergency department.  No recent injury or trauma.  No recent heavy lifting.  Pain is moderate in severity.   Past Medical History:  Diagnosis Date  . Anemia   . Cellulitis   . Homelessness   . Lymphedema of leg   . Paranoid schizophrenia ([place])    pt denies  . Septic joint of left shoulder region [facility]) 10/27/2017    Patient Active Problem List   Diagnosis Date Noted  . History of schizophrenia   . Septic joint of left shoulder region ([place]) 10/27/2017  . Adhesive capsulitis of left shoulder   . Effusion of left shoulder joint 10/10/2017  . Chest pain 10/08/2017  . GAD (generalized anxiety disorder) 06/07/2017  . Financial difficulties 06/07/2017  . Lymphedema 06/07/2017  . Gastric outlet obstruction 10/31/2016  . Chronic pain associated with significant psychosocial dysfunction 06/04/2016  . Schizophrenia, chronic condition with acute exacerbation ([place]) 09/11/2015  . Hypertension 12/03/2014  . Polysubstance abuse ([place]) 12/03/2014  . Schizoaffective disorder, bipolar type ([place]) 12/03/2014  . Back pain 08/11/2013  . Swollen leg 10/10/2012  . Anemia 10/10/2012    Past Surgical History:  Procedure Laterality Date  . SHOULDER ARTHROSCOPY Left 10/11/2017   Procedure: ARTHROSCOPY SHOULDER, LEFT;  Surgeon: Newt Minion, MD;  Location: [facility];  Service: Orthopedics;   Laterality: Left;  . SHOULDER ARTHROSCOPY WITH SUBACROMIAL DECOMPRESSION, ROTATOR CUFF REPAIR AND BICEP TENDON REPAIR Left 11/24/2017   Procedure: LEFT SHOULDER ARTHROSCOPY, OPEN DISTAL CLAVICLE EXCISION, EXTENSIVE INTRA-ARTICULAR AND EXTRA-ARTICULAR DEBRIDEMENT;  Surgeon: [name], MD;  Location: [facility];  Service: Orthopedics;  Laterality: Left;        Home Medications    Prior to Admission medications   Medication Sig Start Date End Date Taking? Authorizing Provider  dicyclomine (BENTYL) 20 MG tablet Take 1 tablet (20 mg total) by mouth every 8 (eight) hours as needed for spasms (cramping). 05/09/18   Petrucelli, Samantha R, PA-C  furosemide (LASIX) 20 MG tablet Take 20 mg by mouth daily.    [provider]  hydrochlorothiazide (HYDRODIURIL) 25 MG tablet Take 1 tablet (25 mg total) by mouth daily. 01/01/18   Truman Hayward, MD  Menthol-Methyl Salicylate (MUSCLE RUB) 10-15 % CREA Apply 1 application topically as needed for muscle pain. 11/26/17   [name], MD  potassium chloride (K-DUR,KLOR-CON) 10 MEQ tablet Take 10 mEq by mouth daily. 10/20/17   [provider]    Family History Family History  Problem Relation Age of Onset  . CAD Unknown        Neg HX  . Hypertension Unknown        Neg Hx  . Diabetes Unknown        Neg Hx  . Cancer Unknown        Neg Hx  . Lupus Mother  Social History Social History   Tobacco Use  . Smoking status: Never Smoker  . Smokeless tobacco: Never Used  Substance Use Topics  . Alcohol use: No  . Drug use: No     Allergies   Pork-derived products   Review of Systems Review of Systems  All other systems reviewed and are negative.    Physical Exam Updated Vital Signs BP (!) 138/106 (BP Location: Right Arm)   Pulse 74   Temp 98.3 F (36.8 C) (Oral)   Resp 16   Ht 6\' 1"  (1.854 m)   SpO2 98%   BMI 26.39 kg/m   Physical Exam  Constitutional: He is oriented to person, place, and time.  He appears well-developed and well-nourished.  HENT:  Head: Normocephalic.  Eyes: EOM are normal.  Neck: Normal range of motion.  Pulmonary/Chest: Effort normal.  Abdominal: He exhibits no distension.  Musculoskeletal: Normal range of motion.  No thoracic or lumbar midline tenderness.  Parathoracic and paralumbar tenderness without significant spasm.  5 out of 5 strength in bilateral lower extremity major muscle groups  Neurological: He is alert and oriented to person, place, and time.  Psychiatric: He has a normal mood and affect.  Nursing note and vitals reviewed.    ED Treatments / Results  Labs (all labs ordered are listed, but only abnormal results are displayed) Labs Reviewed - No data to display  EKG None  Radiology No results found.  Procedures Procedures (including critical care time)  Medications Ordered in ED Medications  acetaminophen (TYLENOL) tablet 650 mg (has no administration in time range)     Initial Impression / Assessment and Plan / ED Course  I have reviewed the triage vital signs and the nursing notes.  Pertinent labs & imaging results that were available during my care of the patient were reviewed by me and considered in my medical decision making (see chart for details).     Ambulatory.  Nonspecific low back pain.  Tylenol given.  Heat applied.  No indication for imaging.  Abdomen is benign.  Moves all 4 extremities.  No recent injury or trauma.  No indication for imaging.  Final Clinical Impressions(s) / ED Diagnoses   Final diagnoses:  Acute bilateral low back pain without sciatica    ED Discharge Orders    None       Jola Schmidt, MD 05/14/18 [phone]

## 2018-05-14 NOTE — Discharge Instructions (Addendum)
Take ibuprofen and tylenol for pain

## 2018-05-14 NOTE — ED Triage Notes (Signed)
He reports having a "catch" in his low back. He is in no distress. He denies any trauma.

## 2018-05-16 ENCOUNTER — Other Ambulatory Visit: Payer: Self-pay

## 2018-05-16 ENCOUNTER — Encounter (HOSPITAL_COMMUNITY): Payer: Self-pay

## 2018-05-16 ENCOUNTER — Emergency Department (HOSPITAL_COMMUNITY)
Admission: EM | Admit: 2018-05-16 | Discharge: 2018-05-16 | Disposition: A | Discharge status: Home / Self Care | Payer: Medicaid Other | Attending: Emergency Medicine | Admitting: Emergency Medicine

## 2018-05-16 DIAGNOSIS — R51 Headache: Secondary | ICD-10-CM | POA: Insufficient documentation

## 2018-05-16 DIAGNOSIS — I10 Essential (primary) hypertension: Secondary | ICD-10-CM | POA: Insufficient documentation

## 2018-05-16 DIAGNOSIS — H5789 Other specified disorders of eye and adnexa: Secondary | ICD-10-CM

## 2018-05-16 DIAGNOSIS — Z79899 Other long term (current) drug therapy: Secondary | ICD-10-CM | POA: Insufficient documentation

## 2018-05-16 DIAGNOSIS — R519 Headache, unspecified: Secondary | ICD-10-CM

## 2018-05-16 MED ORDER — TETRACAINE HCL 0.5 % OP SOLN
2.0000 [drp] | Freq: Once | OPHTHALMIC | Status: AC
  Administered 2018-05-16: 2 [drp] via OPHTHALMIC
  Filled 2018-05-16: qty 4

## 2018-05-16 MED ORDER — ERYTHROMYCIN 5 MG/GM OP OINT
1.0000 "application " | TOPICAL_OINTMENT | Freq: Once | OPHTHALMIC | Status: AC
  Administered 2018-05-16: 1 via OPHTHALMIC
  Filled 2018-05-16: qty 3.5

## 2018-05-16 MED ORDER — PROCHLORPERAZINE EDISYLATE 10 MG/2ML IJ SOLN
10.0000 mg | Freq: Once | INTRAMUSCULAR | Status: AC
  Administered 2018-05-16: 10 mg via INTRAMUSCULAR
  Filled 2018-05-16: qty 2

## 2018-05-16 MED ORDER — KETOROLAC TROMETHAMINE 30 MG/ML IJ SOLN
30.0000 mg | Freq: Once | INTRAMUSCULAR | Status: AC
  Administered 2018-05-16: 30 mg via INTRAMUSCULAR
  Filled 2018-05-16: qty 1

## 2018-05-16 MED ORDER — IBUPROFEN 600 MG PO TABS: 600.0000 mg | ORAL_TABLET | Freq: Four times a day (QID) | ORAL | 0 refills | Status: DC | PRN

## 2018-05-16 MED ORDER — ACETAMINOPHEN 500 MG PO TABS
1000.0000 mg | ORAL_TABLET | Freq: Once | ORAL | Status: AC
  Administered 2018-05-16: 1000 mg via ORAL
  Filled 2018-05-16: qty 2

## 2018-05-16 MED ORDER — DIPHENHYDRAMINE HCL 50 MG/ML IJ SOLN
25.0000 mg | Freq: Once | INTRAMUSCULAR | Status: AC
  Administered 2018-05-16: 25 mg via INTRAMUSCULAR
  Filled 2018-05-16: qty 1

## 2018-05-16 MED ORDER — FLUORESCEIN SODIUM 1 MG OP STRP
1.0000 | ORAL_STRIP | Freq: Once | OPHTHALMIC | Status: AC
  Administered 2018-05-16: 1 via OPHTHALMIC
  Filled 2018-05-16: qty 1

## 2018-05-16 NOTE — ED Notes (Signed)
Patient ambulatory to bathroom with steady gait at this time 

## 2018-05-16 NOTE — ED Provider Notes (Signed)
[facility] EMERGENCY DEPARTMENT Provider Note   CSN: [id number] Arrival date & time: 05/16/18  0746     History   Chief Complaint Chief Complaint  Patient presents with  . Headache    HPI Jonathan Meadows is a 47 y.o. male with a history of paranoid schizophrenia, lymphedema of the right leg, polysubstance abuse, and pyogenic arthritis of the left shoulder who presents to the emergency department with a chief complaint of headache.  The patient states that he awoke with a right frontal headache, onset this morning.  He is unable to characterize the headache.  No radiation.  Constant since onset.  He reports associated right eye pain, redness, and itching that also began when he awoke this morning.  He denies tearing or drainage.  He states he is also been seeing some flashes and floaters in his right visual field with some blurred vision since his symptoms began.  He denies a loss of vision, diplopia, or left eye pain.  No history of similar.    He denies fevers, chills, nausea, vomiting, otalgia, URI symptoms, neck pain or stiffness, facial or neck swelling. No treatment prior to arrival.  No known sick contacts.  No recent head or eye trauma.  The patient does not wear glasses or contacts.  The history is provided by the patient. No language interpreter was used.    Past Medical History:  Diagnosis Date  . Anemia   . Cellulitis   . Homelessness   . Lymphedema of leg   . Paranoid schizophrenia ([place])    pt denies  . Septic joint of left shoulder region [facility]) 10/27/2017    Patient Active Problem List   Diagnosis Date Noted  . History of schizophrenia   . Septic joint of left shoulder region ([place]) 10/27/2017  . Adhesive capsulitis of left shoulder   . Effusion of left shoulder joint 10/10/2017  . Chest pain 10/08/2017  . GAD (generalized anxiety disorder) 06/07/2017  . Financial difficulties 06/07/2017  . Lymphedema 06/07/2017  . Gastric outlet obstruction  10/31/2016  . Chronic pain associated with significant psychosocial dysfunction 06/04/2016  . Schizophrenia, chronic condition with acute exacerbation ([place]) 09/11/2015  . Hypertension 12/03/2014  . Polysubstance abuse ([place]) 12/03/2014  . Schizoaffective disorder, bipolar type ([place]) 12/03/2014  . Back pain 08/11/2013  . Swollen leg 10/10/2012  . Anemia 10/10/2012    Past Surgical History:  Procedure Laterality Date  . SHOULDER ARTHROSCOPY Left 10/11/2017   Procedure: ARTHROSCOPY SHOULDER, LEFT;  Surgeon: Newt Minion, MD;  Location: [facility];  Service: Orthopedics;  Laterality: Left;  . SHOULDER ARTHROSCOPY WITH SUBACROMIAL DECOMPRESSION, ROTATOR CUFF REPAIR AND BICEP TENDON REPAIR Left 11/24/2017   Procedure: LEFT SHOULDER ARTHROSCOPY, OPEN DISTAL CLAVICLE EXCISION, EXTENSIVE INTRA-ARTICULAR AND EXTRA-ARTICULAR DEBRIDEMENT;  Surgeon: [name], MD;  Location: [facility];  Service: Orthopedics;  Laterality: Left;        Home Medications    Prior to Admission medications   Medication Sig Start Date End Date Taking? Authorizing Provider  B Complex-C (SUPER B COMPLEX PO) Take 1 tablet by mouth daily.   Yes [provider]  GINSENG PO Take 1 tablet by mouth daily.   Yes [provider]  omega-3 acid ethyl esters (LOVAZA) 1 g capsule Take 1 g by mouth daily.   Yes [provider]  ibuprofen (ADVIL,MOTRIN) 600 MG tablet Take 1 tablet (600 mg total) by mouth every 6 (six) hours as needed. 05/16/18   Rowan Pollman, Maree Erie A, PA-C  Family History Family History  Problem Relation Age of Onset  . CAD Unknown        Neg HX  . Hypertension Unknown        Neg Hx  . Diabetes Unknown        Neg Hx  . Cancer Unknown        Neg Hx  . Lupus Mother     Social History Social History   Tobacco Use  . Smoking status: Never Smoker  . Smokeless tobacco: Never Used  Substance Use Topics  . Alcohol use: No  . Drug use: No     Allergies   Pork-derived  products   Review of Systems Review of Systems  Constitutional: Negative for appetite change and fever.  HENT: Negative for congestion, drooling, ear pain, hearing loss, sinus pressure, sinus pain and trouble swallowing.   Eyes: Positive for pain, redness, itching and visual disturbance (blurred vision). Negative for discharge.  Respiratory: Negative for shortness of breath.   Cardiovascular: Negative for chest pain.  Gastrointestinal: Negative for abdominal pain, diarrhea, nausea and vomiting.  Genitourinary: Negative for dysuria.  Musculoskeletal: Negative for back pain.  Skin: Negative for rash.  Allergic/Immunologic: Negative for immunocompromised state.  Neurological: Positive for dizziness and headaches. Negative for seizures, syncope, speech difficulty, weakness, light-headedness and numbness.  Psychiatric/Behavioral: Negative for confusion.     Physical Exam Updated Vital Signs BP 130/89   Pulse 69   Temp 97.9 F (36.6 C) (Oral)   Resp 17   Ht 6\' 1"  (1.854 m)   Wt 90.7 kg   SpO2 100%   BMI 26.39 kg/m   Physical Exam  Constitutional: He appears well-developed. No distress.  HENT:  Head: Normocephalic.  Right Ear: External ear normal.  Left Ear: External ear normal.  Eyes: Pupils are equal, round, and reactive to light. EOM and lids are normal. Right eye exhibits no chemosis, no discharge and no exudate. No foreign body present in the right eye. Left eye exhibits no chemosis, no discharge and no exudate. No foreign body present in the left eye. Right conjunctiva is injected. Right conjunctiva has no hemorrhage. Left conjunctiva is not injected. Left conjunctiva has no hemorrhage. No scleral icterus.  Fundoscopic exam:      The right eye shows no AV nicking, no exudate, no hemorrhage and no papilledema.       The left eye shows no AV nicking, no exudate, no hemorrhage and no papilledema.  Slit lamp exam:      The right eye shows no corneal abrasion, no fluorescein  uptake and no anterior chamber bulge.  IOP 22 on the left and 20 on the right.   Neck: Normal range of motion. Neck supple.  Cardiovascular: Normal rate, regular rhythm, normal heart sounds and intact distal pulses. Exam reveals no gallop and no friction rub.  No murmur heard. Pulmonary/Chest: Effort normal. No stridor. No respiratory distress. He has no wheezes. He has no rales. He exhibits no tenderness.  Abdominal: Soft. He exhibits no distension.  Lymphadenopathy:    He has no cervical adenopathy.  Neurological: He is alert.  GCS 15.  No dysmetria with finger-to-nose and heel-to-shin bilaterally.  Normal rapid alternating movements.  Cranial nerves II through XII are grossly intact.  5 out of 5 strength against resistance of the bilateral upper and lower extremities.  Sensation is intact and symmetric throughout.  Negative Romberg.  No pronator drift.  Symmetric tandem gait.  Skin: Skin is warm and dry. He  is not diaphoretic.  Psychiatric: His behavior is normal.  Nursing note and vitals reviewed.  ED Treatments / Results  Labs (all labs ordered are listed, but only abnormal results are displayed) Labs Reviewed - No data to display  EKG None  Radiology No results found.  Procedures Procedures (including critical care time) EMERGENCY DEPARTMENT [place] OCULAR EXAM "Study: Limited Ultrasound of Orbit "  INDICATIONS: Floaters/Flashes Linear probe utilized to obtain images in both long and short axis of the orbit having the patient look left and right if possible.  PERFORMED BY: Myself IMAGES ARCHIVED?: Yes LIMITATIONS: none VIEWS USED: Right orbit INTERPRETATION: No retinal detachment   Medications Ordered in ED Medications  fluorescein ophthalmic strip 1 strip (1 strip Right Eye Given by Other 05/16/18 0944)  tetracaine (PONTOCAINE) 0.5 % ophthalmic solution 2 drop (2 drops Both Eyes Given by Other 05/16/18 0944)  acetaminophen (TYLENOL) tablet 1,000 mg (1,000 mg Oral Given  05/16/18 0819)  ketorolac (TORADOL) 30 MG/ML injection 30 mg (30 mg Intramuscular Given 05/16/18 0946)  diphenhydrAMINE (BENADRYL) injection 25 mg (25 mg Intramuscular Given 05/16/18 0945)  prochlorperazine (COMPAZINE) injection 10 mg (10 mg Intramuscular Given 05/16/18 0945)  erythromycin ophthalmic ointment 1 application (1 application Right Eye Given 05/16/18 0946)     Initial Impression / Assessment and Plan / ED Course  I have reviewed the triage vital signs and the nursing notes.  Pertinent labs & imaging results that were available during my care of the patient were reviewed by me and considered in my medical decision making (see chart for details).  Clinical Course as of May 16 1244  Nancy Fetter May 16, 2018  0940 Patient is requesting orange juice, sandwich, and a Percocet for headache. Discussed with patient Percocet is not indicated at this time.    [MM]  1036 Patient reports his headache and dizziness have significantly subsided.   [MM]    Clinical Course User Index [MM] Storm Sovine A, PA-C    47 year old male with a history of paranoid schizophrenia, lymphedema of the right leg, polysubstance abuse, and pyogenic arthritis of the left shoulder presenting with right-sided frontal headache, dizziness, gait instability, and right eye redness, pain, and itching, onset this morning.  No constitutional symptoms.  Vital signs are reassuring.  Intraocular pressure of the bilateral orbits are normal.  No floor seen uptake of the right eye.  Bedside ultrasound of the right eye is negative for retinal detachment.  No focal neurologic deficits on exam.  No acute change in visual acuity.  Patient has requested Percocet for his headache.  Discussed that Tylenol would be appropriate in the ED.  After reevaluation, he reports minimal improvement in his symptoms.  Labs from 05/09/2018 are reviewed and are reassuring.  He was given a migraine cocktail with significant improvement in his headache.   Dizziness has resolved.  He has been ambulatory in the ED without difficulty.  Erythromycin ointment for possible early bacterial conjunctivitis has been given.  Low suspicion for retinal detachment, orbital compartment syndrome, vitreous hemorrhage, uveitis, optic nerve injury, or CVA.  The patient was discussed with Dr. Vanita Panda, attending physician.  Will discharge the patient with ibuprofen for headache, erythromycin ointment, and ophthalmology follow-up.  Strict return precautions given.  The patient is hemodynamically stable and in no acute distress.  He is safe for discharge home with outpatient follow-up at this time.  Final Clinical Impressions(s) / ED Diagnoses   Final diagnoses:  Redness of right eye  Bad headache    ED  Discharge Orders         Ordered    ibuprofen (ADVIL,MOTRIN) 600 MG tablet  Every 6 hours PRN     05/16/18 1042           Jasa Dundon, Laymond Purser, PA-C 05/16/18 1245    Carmin Muskrat, MD 05/16/18 1245

## 2018-05-16 NOTE — ED Notes (Signed)
Pt provided with [place] sandwich, orange juice, graham crackers, and peanut butter.

## 2018-05-16 NOTE — ED Triage Notes (Signed)
Pt arrives to ED with complaints of headaches and dizzy spells since this morning when he woke up, also states he has had swollen eyes and is concerned for pink eye. Pt alert and oriented, PA present. Pt placed in position of comfort with call bell in reach, bed locked and lowered.

## 2018-05-16 NOTE — Discharge Instructions (Addendum)
Thank you for allowing me to care for you today in the Emergency Department.   Apply a 0.5 inch ribbon, approximately the width of your thumb, to your bottom eyelid every 6 hours while you are awake for the next 5 days.  Make sure you are washing your hands frequently, including every time that you touch your eye.  If your symptoms do not start to improve in the next 4 to 5 days, please follow-up with ophthalmology.  Dr. Romeo Apple number is listed above.  You can take 600 mg of ibuprofen with food or 650 mg of Tylenol every 6 hours for pain control.  Return to the emergency department if you lose vision or part of your vision in your eye, if you develop a high fever, redness and swelling to the skin around the eye, double vision, or other new, concerning symptoms.

## 2018-05-16 NOTE — ED Notes (Signed)
Patient verbalizes understanding of discharge instructions. Opportunity for questioning and answers were provided. Armband removed by staff, pt discharged from ED ambulatory.   

## 2018-05-20 ENCOUNTER — Other Ambulatory Visit: Payer: Self-pay

## 2018-05-20 DIAGNOSIS — M7989 Other specified soft tissue disorders: Secondary | ICD-10-CM

## 2018-05-21 MED FILL — HYDROCODON-APAP 5-325: 5-325 | 10 days supply | Qty: 30 | Fill #0

## 2018-05-21 MED FILL — HYDROCHLOROTHIAZIDE 25 MG T: 25 | 30 days supply | Qty: 30 | Fill #1

## 2018-05-24 ENCOUNTER — Emergency Department (HOSPITAL_COMMUNITY)
Admission: EM | Admit: 2018-05-24 | Discharge: 2018-05-24 | Disposition: A | Discharge status: Home / Self Care | Payer: Medicaid Other | Attending: Emergency Medicine | Admitting: Emergency Medicine

## 2018-05-24 ENCOUNTER — Other Ambulatory Visit: Payer: Self-pay

## 2018-05-24 ENCOUNTER — Encounter (HOSPITAL_COMMUNITY): Payer: Self-pay | Admitting: Emergency Medicine

## 2018-05-24 DIAGNOSIS — G5602 Carpal tunnel syndrome, left upper limb: Secondary | ICD-10-CM | POA: Insufficient documentation

## 2018-05-24 DIAGNOSIS — Z79899 Other long term (current) drug therapy: Secondary | ICD-10-CM | POA: Diagnosis not present

## 2018-05-24 DIAGNOSIS — M79642 Pain in left hand: Secondary | ICD-10-CM | POA: Diagnosis present

## 2018-05-24 MED ORDER — IBUPROFEN 400 MG PO TABS
600.0000 mg | ORAL_TABLET | Freq: Once | ORAL | Status: AC
  Administered 2018-05-24: 600 mg via ORAL
  Filled 2018-05-24: qty 1

## 2018-05-24 NOTE — ED Provider Notes (Signed)
[facility] EMERGENCY DEPARTMENT Provider Note   CSN: [id number] Arrival date & time: 05/24/18  0626     History   Chief Complaint Chief Complaint  Patient presents with  . Hand Pain    HPI Jonathan Meadows is a 47 y.o. male.  HPI    47 year old male presents today with complaints of left hand pain.  Patient notes that he woke up this morning with stinging tingling and pain in his left wrist and hand.  Patient cannot localize the symptoms, no loss of grip strength, no swelling or edema, no trauma or overuse.  No history of the same.   Past Medical History:  Diagnosis Date  . Anemia   . Cellulitis   . Homelessness   . Lymphedema of leg   . Paranoid schizophrenia ([place])    pt denies  . Septic joint of left shoulder region [facility]) 10/27/2017    Patient Active Problem List   Diagnosis Date Noted  . History of schizophrenia   . Septic joint of left shoulder region ([place]) 10/27/2017  . Adhesive capsulitis of left shoulder   . Effusion of left shoulder joint 10/10/2017  . Chest pain 10/08/2017  . GAD (generalized anxiety disorder) 06/07/2017  . Financial difficulties 06/07/2017  . Lymphedema 06/07/2017  . Gastric outlet obstruction 10/31/2016  . Chronic pain associated with significant psychosocial dysfunction 06/04/2016  . Schizophrenia, chronic condition with acute exacerbation ([place]) 09/11/2015  . Hypertension 12/03/2014  . Polysubstance abuse ([place]) 12/03/2014  . Schizoaffective disorder, bipolar type ([place]) 12/03/2014  . Back pain 08/11/2013  . Swollen leg 10/10/2012  . Anemia 10/10/2012    Past Surgical History:  Procedure Laterality Date  . SHOULDER ARTHROSCOPY Left 10/11/2017   Procedure: ARTHROSCOPY SHOULDER, LEFT;  Surgeon: Newt Minion, MD;  Location: [facility];  Service: Orthopedics;  Laterality: Left;  . SHOULDER ARTHROSCOPY WITH SUBACROMIAL DECOMPRESSION, ROTATOR CUFF REPAIR AND BICEP TENDON REPAIR Left 11/24/2017   Procedure: LEFT SHOULDER  ARTHROSCOPY, OPEN DISTAL CLAVICLE EXCISION, EXTENSIVE INTRA-ARTICULAR AND EXTRA-ARTICULAR DEBRIDEMENT;  Surgeon: [name], MD;  Location: [facility];  Service: Orthopedics;  Laterality: Left;        Home Medications    Prior to Admission medications   Medication Sig Start Date End Date Taking? Authorizing Provider  B Complex-C (SUPER B COMPLEX PO) Take 1 tablet by mouth daily.    [provider]  GINSENG PO Take 1 tablet by mouth daily.    [provider]  ibuprofen (ADVIL,MOTRIN) 600 MG tablet Take 1 tablet (600 mg total) by mouth every 6 (six) hours as needed. 05/16/18   McDonald, Mia A, PA-C  omega-3 acid ethyl esters (LOVAZA) 1 g capsule Take 1 g by mouth daily.    [provider]    Family History Family History  Problem Relation Age of Onset  . CAD Unknown        Neg HX  . Hypertension Unknown        Neg Hx  . Diabetes Unknown        Neg Hx  . Cancer Unknown        Neg Hx  . Lupus Mother     Social History Social History   Tobacco Use  . Smoking status: Never Smoker  . Smokeless tobacco: Never Used  Substance Use Topics  . Alcohol use: No  . Drug use: No     Allergies   Pork-derived products   Review of Systems Review of Systems  All other  systems reviewed and are negative.    Physical Exam Updated Vital Signs BP (!) 143/103 (BP Location: Right Arm)   Pulse 80   Temp 98 F (36.7 C) (Oral)   Resp 16   Ht 6\' 1"  (1.854 m)   SpO2 100%   BMI 26.39 kg/m   Physical Exam  Constitutional: He is oriented to person, place, and time. He appears well-developed and well-nourished.  HENT:  Head: Normocephalic and atraumatic.  Eyes: Pupils are equal, round, and reactive to light. Conjunctivae are normal. Right eye exhibits no discharge. Left eye exhibits no discharge. No scleral icterus.  Neck: Normal range of motion. No JVD present. No tracheal deviation present.  Pulmonary/Chest: Effort normal. No stridor.    Musculoskeletal:  Hand symmetrical no swelling or edema, no redness or warmth to touch full active range of motion of the wrist and fingers-cap refill intact, pain with compression over the carpal tunnel-remainder of upper extremity nontender to palpation full active range of motion no swelling or edema, no cervical thoracic spinal tenderness, no pain with axial compression of the cervical spine  Neurological: He is alert and oriented to person, place, and time. Coordination normal.  Psychiatric: He has a normal mood and affect. His behavior is normal. Judgment and thought content normal.  Nursing note and vitals reviewed.    ED Treatments / Results  Labs (all labs ordered are listed, but only abnormal results are displayed) Labs Reviewed - No data to display  EKG None  Radiology No results found.  Procedures Procedures (including critical care time)  Medications Ordered in ED Medications - No data to display   Initial Impression / Assessment and Plan / ED Course  I have reviewed the triage vital signs and the nursing notes.  Pertinent labs & imaging results that were available during my care of the patient were reviewed by me and considered in my medical decision making (see chart for details).     Labs:   Imaging:  Consults:  Therapeutics:  Discharge Meds:   Assessment/Plan: 63 YOM presents today with paresthesias.  This is probably returning from his wrist.  Patient is a poor historian, question carpal tunnel.  No signs of compartment syndrome, no immediate need for further evaluation management in the setting.  Patient was placed in the wrist splint, given strict return precautions and care instructions.  He verbalized understanding and agreement to today's plan.      Final Clinical Impressions(s) / ED Diagnoses   Final diagnoses:  Carpal tunnel syndrome of left wrist    ED Discharge Orders    None       Francee Gentile 05/24/18 [id number]     Carmin Muskrat, MD 05/24/18 1538

## 2018-05-24 NOTE — Discharge Instructions (Addendum)
Please read attached information. If you experience any new or worsening signs or symptoms please return to the emergency room for evaluation. Please follow-up with your primary care provider or specialist as discussed.  °

## 2018-05-24 NOTE — ED Triage Notes (Signed)
Pt arrives via POV from home with left hand pain since this morning. Denies recent fevers. No known injury. States pain is described as pins and needles. Full active ROM. Distal circulation intact. VSS.

## 2018-05-24 NOTE — ED Notes (Addendum)
Pt. Request to use scale, weight 230.7lbs

## 2018-06-10 ENCOUNTER — Encounter (INDEPENDENT_AMBULATORY_CARE_PROVIDER_SITE_OTHER): Payer: Self-pay | Admitting: Physician Assistant

## 2018-06-10 ENCOUNTER — Other Ambulatory Visit: Payer: Self-pay

## 2018-06-10 ENCOUNTER — Ambulatory Visit (INDEPENDENT_AMBULATORY_CARE_PROVIDER_SITE_OTHER): Payer: Medicaid Other | Admitting: Physician Assistant

## 2018-06-10 VITALS — BP 111/79 | HR 81 | Temp 97.8°F | Ht 73.0 in | Wt 231.4 lb

## 2018-06-10 DIAGNOSIS — I890 Lymphedema, not elsewhere classified: Secondary | ICD-10-CM | POA: Diagnosis not present

## 2018-06-10 NOTE — Patient Instructions (Signed)

## 2018-06-10 NOTE — Progress Notes (Signed)
Subjective:  Patient ID: Jonathan Meadows, male    DOB: 08-23-1970  Age: 47 y.o. MRN: [id number]  CC:   HPI Jonathan Meadows is a 47 y.o. male with a medical history of paraoid schizophrenia, anemia, cellulitis, homelessness, lymphedema of leg, and septic joint of left shoulder presents as a new patient for management of his RLE lymphedema. Onset 2013. He has already tried physiotherapy and wears compression stockings but lymphedema persists. Had many infections of RLE since having lymph edema but none recently. Also requests pneumatic pump since his was auctioned off after eviction. Needs a prescription for new compression stocking since his current stocking is losing elasticity. Lastly, requests Percocet for pain. Has never had surgical evaluation.    Outpatient Medications Prior to Visit  Medication Sig Dispense Refill  . B Complex-C (SUPER B COMPLEX PO) Take 1 tablet by mouth daily.    Marland Kitchen GINSENG PO Take 1 tablet by mouth daily.    Marland Kitchen ibuprofen (ADVIL,MOTRIN) 600 MG tablet Take 1 tablet (600 mg total) by mouth every 6 (six) hours as needed. 30 tablet 0  . omega-3 acid ethyl esters (LOVAZA) 1 g capsule Take 1 g by mouth daily.     No facility-administered medications prior to visit.      ROS Review of Systems  Constitutional: Negative for chills, fever and malaise/fatigue.  Eyes: Negative for blurred vision.  Respiratory: Negative for shortness of breath.   Cardiovascular: Positive for leg swelling. Negative for chest pain and palpitations.  Gastrointestinal: Negative for abdominal pain and nausea.  Genitourinary: Negative for dysuria and hematuria.  Musculoskeletal: Negative for joint pain and myalgias.  Skin: Negative for rash.  Neurological: Negative for tingling and headaches.  Psychiatric/Behavioral: Negative for depression. The patient is not nervous/anxious.     Objective:  Ht 6\' 1"  (1.854 m)   Wt 231 lb 6.4 oz (105 kg)   BMI 30.53 kg/m   BP/Weight 06/10/2018 05/24/2018  34/74/2595  Systolic BP - 638 756  Diastolic BP - 433 89  Wt. (Lbs) 231.4 - 200  BMI 30.53 26.39 26.39  Some encounter information is confidential and restricted. Go to Review Flowsheets activity to see all data.      Physical Exam Vitals signs reviewed.  Constitutional:      Comments: Well developed, well nourished, NAD, polite  HENT:     Head: Normocephalic and atraumatic.  Eyes:     General: No scleral icterus. Neck:     Musculoskeletal: Normal range of motion and neck supple.     Thyroid: No thyromegaly.  Cardiovascular:     Rate and Rhythm: Normal rate and regular rhythm.     Heart sounds: Normal heart sounds.  Pulmonary:     Effort: Pulmonary effort is normal.     Breath sounds: Normal breath sounds.  Abdominal:     Palpations: Abdomen is soft.  Musculoskeletal:     Comments: Entire RLE severely edematous   Skin:    General: Skin is warm and dry.     Coloration: Skin is not pale.     Findings: No erythema or rash.  Neurological:     Mental Status: He is alert and oriented to person, place, and time.  Psychiatric:        Behavior: Behavior normal.        Thought Content: Thought content normal.      Assessment & Plan:   1. Lymphedema - Ambulatory referral to Physical Therapy - Ambulatory referral to Pain Clinic - Compression stockings -  Lymphedema pump - Angiotensin converting enzyme - ANA w/Reflex - Rheumatoid factor - CBC with Differential - Comprehensive metabolic panel - We have called [facility] specialists but RN told us there is no such thing as lymph vessel surgery. I have messaged Ms. Rhyne PA-C from [facility] and asked her if there is any surgeon at her practice that performs lymph bypass surgery. Will await response. Will send to [facility] if lymph bypass surgery is not performed at [facility].  Follow-up: Return in about 4 weeks (around 07/08/2018) for lymphedema.   Clent Demark PA

## 2018-06-11 ENCOUNTER — Other Ambulatory Visit (INDEPENDENT_AMBULATORY_CARE_PROVIDER_SITE_OTHER): Payer: Self-pay | Admitting: Physician Assistant

## 2018-06-11 DIAGNOSIS — I890 Lymphedema, not elsewhere classified: Secondary | ICD-10-CM

## 2018-06-11 LAB — COMPREHENSIVE METABOLIC PANEL
A/G RATIO: 1.2 (ref 1.2–2.2)
ALT: 17 IU/L (ref 0–44)
AST: 23 IU/L (ref 0–40)
Albumin: 4.1 g/dL (ref 3.5–5.5)
Alkaline Phosphatase: 81 IU/L (ref 39–117)
BUN/Creatinine Ratio: 8 — ABNORMAL LOW (ref 9–20)
BUN: 10 mg/dL (ref 6–24)
Bilirubin Total: 0.3 mg/dL (ref 0.0–1.2)
CALCIUM: 9.5 mg/dL (ref 8.7–10.2)
CHLORIDE: 100 mmol/L (ref 96–106)
CO2: 25 mmol/L (ref 20–29)
Creatinine, Ser: 1.23 mg/dL (ref 0.76–1.27)
GFR calc Af Amer: 80 mL/min/{1.73_m2} (ref >59)
GFR, EST NON AFRICAN AMERICAN: 69 mL/min/{1.73_m2} (ref >59)
GLUCOSE: 81 mg/dL (ref 65–99)
Globulin, Total: 3.5 g/dL (ref 1.5–4.5)
POTASSIUM: 3.6 mmol/L (ref 3.5–5.2)
Sodium: 141 mmol/L (ref 134–144)
TOTAL PROTEIN: 7.6 g/dL (ref 6.0–8.5)

## 2018-06-11 LAB — CBC WITH DIFFERENTIAL/PLATELET
BASOS ABS: 0 10*3/uL (ref 0.0–0.2)
Basos: 1 %
EOS (ABSOLUTE): 0.1 10*3/uL (ref 0.0–0.4)
Eos: 2 %
Hematocrit: 41 % (ref 37.5–51.0)
Hemoglobin: 13.9 g/dL (ref 13.0–17.7)
IMMATURE GRANS (ABS): 0 10*3/uL (ref 0.0–0.1)
IMMATURE GRANULOCYTES: 0 %
LYMPHS: 45 %
Lymphocytes Absolute: 2.2 10*3/uL (ref 0.7–3.1)
MCH: 30.2 pg (ref 26.6–33.0)
MCHC: 33.9 g/dL (ref 31.5–35.7)
MCV: 89 fL (ref 79–97)
MONOS ABS: 0.3 10*3/uL (ref 0.1–0.9)
Monocytes: 6 %
NEUTROS PCT: 46 %
Neutrophils Absolute: 2.2 10*3/uL (ref 1.4–7.0)
PLATELETS: 370 10*3/uL (ref 150–450)
RBC: 4.61 x10E6/uL (ref 4.14–5.80)
RDW: 14.3 % (ref 12.3–15.4)
WBC: 4.8 10*3/uL (ref 3.4–10.8)

## 2018-06-11 LAB — ANA W/REFLEX: ANA: NEGATIVE

## 2018-06-11 LAB — ANGIOTENSIN CONVERTING ENZYME: ANGIO CONVERT ENZYME: 19 U/L (ref 14–82)

## 2018-06-11 LAB — RHEUMATOID FACTOR

## 2018-06-14 ENCOUNTER — Telehealth (INDEPENDENT_AMBULATORY_CARE_PROVIDER_SITE_OTHER): Payer: Self-pay

## 2018-06-14 NOTE — Telephone Encounter (Signed)
Patient returned call and was informed that all labs normal. No sarcoidosis or autoimmune disease. Nat Christen, CMA

## 2018-06-14 NOTE — Telephone Encounter (Signed)
Left voicemail asking patient to return call to [facility] at [phone]. Meekah Math S Leanore Biggers, CMA  

## 2018-06-14 NOTE — Telephone Encounter (Signed)
-----   Message from Clent Demark, PA-C sent at 06/14/2018  9:27 AM EST ----- Labs completely normal. No sarcoidosis or autoimmune disease.

## 2018-06-16 ENCOUNTER — Emergency Department (HOSPITAL_COMMUNITY): Payer: Medicaid Other

## 2018-06-16 ENCOUNTER — Emergency Department (HOSPITAL_COMMUNITY)
Admission: EM | Admit: 2018-06-16 | Discharge: 2018-06-16 | Disposition: A | Discharge status: Home / Self Care | Payer: Medicaid Other | Attending: Emergency Medicine | Admitting: Emergency Medicine

## 2018-06-16 ENCOUNTER — Encounter (HOSPITAL_COMMUNITY): Payer: Self-pay | Admitting: *Deleted

## 2018-06-16 DIAGNOSIS — R7982 Elevated C-reactive protein (CRP): Secondary | ICD-10-CM | POA: Insufficient documentation

## 2018-06-16 DIAGNOSIS — M25512 Pain in left shoulder: Secondary | ICD-10-CM | POA: Insufficient documentation

## 2018-06-16 DIAGNOSIS — Z79899 Other long term (current) drug therapy: Secondary | ICD-10-CM | POA: Diagnosis not present

## 2018-06-16 DIAGNOSIS — I10 Essential (primary) hypertension: Secondary | ICD-10-CM | POA: Diagnosis not present

## 2018-06-16 DIAGNOSIS — R700 Elevated erythrocyte sedimentation rate: Secondary | ICD-10-CM | POA: Insufficient documentation

## 2018-06-16 LAB — BASIC METABOLIC PANEL
ANION GAP: 11 (ref 5–15)
BUN: 9 mg/dL (ref 6–20)
CO2: 27 mmol/L (ref 22–32)
Calcium: 9.5 mg/dL (ref 8.9–10.3)
Chloride: 101 mmol/L (ref 98–111)
Creatinine, Ser: 1.24 mg/dL (ref 0.61–1.24)
GFR calc Af Amer: >60 mL/min (ref >60)
Glucose, Bld: 97 mg/dL (ref 70–99)
POTASSIUM: 3.5 mmol/L (ref 3.5–5.1)
SODIUM: 139 mmol/L (ref 135–145)

## 2018-06-16 LAB — CBC WITH DIFFERENTIAL/PLATELET
Abs Immature Granulocytes: 0 10*3/uL (ref 0.00–0.07)
BASOS PCT: 1 %
Basophils Absolute: 0 10*3/uL (ref 0.0–0.1)
Eosinophils Absolute: 0.1 10*3/uL (ref 0.0–0.5)
Eosinophils Relative: 2 %
HCT: 44.1 % (ref 39.0–52.0)
Hemoglobin: 14.3 g/dL (ref 13.0–17.0)
Immature Granulocytes: 0 %
Lymphocytes Relative: 39 %
Lymphs Abs: 1.6 10*3/uL (ref 0.7–4.0)
MCH: 29.4 pg (ref 26.0–34.0)
MCHC: 32.4 g/dL (ref 30.0–36.0)
MCV: 90.7 fL (ref 80.0–100.0)
MONO ABS: 0.4 10*3/uL (ref 0.1–1.0)
Monocytes Relative: 9 %
NEUTROS PCT: 49 %
Neutro Abs: 1.9 10*3/uL (ref 1.7–7.7)
PLATELETS: 358 10*3/uL (ref 150–400)
RBC: 4.86 MIL/uL (ref 4.22–5.81)
RDW: 13.3 % (ref 11.5–15.5)
WBC: 3.9 10*3/uL — AB (ref 4.0–10.5)
nRBC: 0 % (ref 0.0–0.2)

## 2018-06-16 LAB — C-REACTIVE PROTEIN: CRP: 2 mg/dL — ABNORMAL HIGH (ref <1.0)

## 2018-06-16 LAB — SEDIMENTATION RATE: SED RATE: 37 mm/h — AB (ref 0–16)

## 2018-06-16 MED ORDER — ACETAMINOPHEN 500 MG PO TABS
1000.0000 mg | ORAL_TABLET | Freq: Once | ORAL | Status: AC
  Administered 2018-06-16: 1000 mg via ORAL
  Filled 2018-06-16: qty 2

## 2018-06-16 MED ORDER — METHOCARBAMOL 500 MG PO TABS
750.0000 mg | ORAL_TABLET | Freq: Once | ORAL | Status: AC
  Administered 2018-06-16: 750 mg via ORAL
  Filled 2018-06-16: qty 2

## 2018-06-16 MED ORDER — METHOCARBAMOL 500 MG PO TABS: 500.0000 mg | ORAL_TABLET | Freq: Two times a day (BID) | ORAL | 0 refills | Status: DC

## 2018-06-16 NOTE — ED Triage Notes (Signed)
Pt in c/o left shoulder pain for the last week, states he had surgery on that shoulder a few months ago and has intermittent pain to that area, does not have any more follow ups planned at this time

## 2018-06-16 NOTE — Discharge Instructions (Signed)
It was my pleasure taking care of you today!  Robaxin twice daily as needed for pain. Ice shoulder throughout the day (instructions below).  Wear shoulder sling for no more than 3 days, then begin performing gentle range of motion exercises.   Call the orthopedist listed today or tomorrow to schedule a follow up appointment for recheck of ongoing shoulder pain in 1-2 weeks that can be canceled with a 24-48 hour notice if complete resolution of pain.    Call the orthopedist listed if symptoms are not improved in one week.   Return to the ER for new or worsening symptoms, any additional concerns.  COLD THERAPY DIRECTIONS:  Ice or gel packs can be used to reduce both pain and swelling. Ice is the most helpful within the first 24 to 48 hours after an injury or flareup from overusing a muscle or joint.  Ice is effective, has very few side effects, and is safe for most people to use.   If you expose your skin to cold temperatures for too long or without the proper protection, you can damage your skin or nerves. Watch for signs of skin damage due to cold.   HOME CARE INSTRUCTIONS  Follow these tips to use ice and cold packs safely.  Place a dry or damp towel between the ice and skin. A damp towel will cool the skin more quickly, so you may need to shorten the time that the ice is used.  For a more rapid response, add gentle compression to the ice.  Ice for no more than 10 to 20 minutes at a time. The bonier the area you are icing, the less time it will take to get the benefits of ice.  Check your skin after 5 minutes to make sure there are no signs of a poor response to cold or skin damage.  Rest 20 minutes or more in between uses.  Once your skin is numb, you can end your treatment. You can test numbness by very lightly touching your skin. The touch should be so light that you do not see the skin dimple from the pressure of your fingertip. When using ice, most people will feel these normal sensations  in this order: cold, burning, aching, and numbness. \  Return immediately if you develop any worsening range of motion of your shoulder, redness, or swelling of the shoulder, or fever or chills.

## 2018-06-16 NOTE — ED Notes (Signed)
Patient transported to X-ray 

## 2018-06-16 NOTE — ED Provider Notes (Signed)
[facility] EMERGENCY DEPARTMENT Provider Note   CSN: [id number] Arrival date & time: 06/16/18  4098     History   Chief Complaint Chief Complaint  Patient presents with  . Shoulder Pain    HPI Jonathan Meadows is a 47 y.o. male.  HPI  Patient is a 47 year old male with a history of pyogenic arthritis of the left shoulder, idiopathic, lymphedema, schizophrenia, presenting for left shoulder pain.  He reports that it has been worsening over the past week but particularly over the past 48 hours.  He does not know of any injury that caused it.  He reports that hurts with movement.  Denies any erythema or edema of the shoulder.  Patient denies any fevers or chills.Denies chest pain or shortness of breath.  Denies history of cardiovascular disease.    Past Medical History:  Diagnosis Date  . Anemia   . Cellulitis   . Homelessness   . Lymphedema of leg   . Paranoid schizophrenia ([place])    pt denies  . Septic joint of left shoulder region [facility]) 10/27/2017    Patient Active Problem List   Diagnosis Date Noted  . History of schizophrenia   . Septic joint of left shoulder region ([place]) 10/27/2017  . Adhesive capsulitis of left shoulder   . Effusion of left shoulder joint 10/10/2017  . Chest pain 10/08/2017  . GAD (generalized anxiety disorder) 06/07/2017  . Financial difficulties 06/07/2017  . Lymphedema 06/07/2017  . Gastric outlet obstruction 10/31/2016  . Chronic pain associated with significant psychosocial dysfunction 06/04/2016  . Schizophrenia, chronic condition with acute exacerbation ([place]) 09/11/2015  . Hypertension 12/03/2014  . Polysubstance abuse ([place]) 12/03/2014  . Schizoaffective disorder, bipolar type ([place]) 12/03/2014  . Back pain 08/11/2013  . Swollen leg 10/10/2012  . Anemia 10/10/2012    Past Surgical History:  Procedure Laterality Date  . SHOULDER ARTHROSCOPY Left 10/11/2017   Procedure: ARTHROSCOPY SHOULDER, LEFT;  Surgeon: Newt Minion,  MD;  Location: [facility];  Service: Orthopedics;  Laterality: Left;  . SHOULDER ARTHROSCOPY WITH SUBACROMIAL DECOMPRESSION, ROTATOR CUFF REPAIR AND BICEP TENDON REPAIR Left 11/24/2017   Procedure: LEFT SHOULDER ARTHROSCOPY, OPEN DISTAL CLAVICLE EXCISION, EXTENSIVE INTRA-ARTICULAR AND EXTRA-ARTICULAR DEBRIDEMENT;  Surgeon: [name], MD;  Location: [facility];  Service: Orthopedics;  Laterality: Left;        Home Medications    Prior to Admission medications   Medication Sig Start Date End Date Taking? Authorizing Provider  B Complex-C (SUPER B COMPLEX PO) Take 1 tablet by mouth daily.    [provider]  GINSENG PO Take 1 tablet by mouth daily.    [provider]  ibuprofen (ADVIL,MOTRIN) 600 MG tablet Take 1 tablet (600 mg total) by mouth every 6 (six) hours as needed. 05/16/18   McDonald, Mia A, PA-C  omega-3 acid ethyl esters (LOVAZA) 1 g capsule Take 1 g by mouth daily.    [provider]    Family History Family History  Problem Relation Age of Onset  . CAD Unknown        Neg HX  . Hypertension Unknown        Neg Hx  . Diabetes Unknown        Neg Hx  . Cancer Unknown        Neg Hx  . Lupus Mother     Social History Social History   Tobacco Use  . Smoking status: Never Smoker  . Smokeless tobacco: Never Used  Substance  Use Topics  . Alcohol use: No  . Drug use: No     Allergies   Pork-derived products   Review of Systems Review of Systems  Constitutional: Negative for chills and fever.  Musculoskeletal: Positive for arthralgias and myalgias.  Skin: Negative for color change.  Neurological: Negative for weakness and numbness.     Physical Exam Updated Vital Signs BP 122/79 (BP Location: Right Arm)   Pulse 88   Temp (!) 97.5 F (36.4 C) (Oral)   Resp 20   SpO2 99%   Physical Exam Vitals signs and nursing note reviewed.  Constitutional:      General: He is not in acute distress.    Appearance: He is well-developed. He  is not diaphoretic.     Comments: Sitting comfortably in bed.  HENT:     Head: Normocephalic and atraumatic.  Eyes:     General:        Right eye: No discharge.        Left eye: No discharge.     Conjunctiva/sclera: Conjunctivae normal.     Comments: EOMs normal to gross examination.  Neck:     Musculoskeletal: Normal range of motion.  Cardiovascular:     Rate and Rhythm: Normal rate and regular rhythm.     Comments: Intact, 2+ radial pulse of UEs bilaterally. Abdominal:     General: There is no distension.  Musculoskeletal: Normal range of motion.     Comments: Left shoulder with tenderness to palpation over [facility] joint and along spine of scapula. Decreased ROM, particularly with flexion and internal/external rotation. Positive empty can test, positive Neer's. No swelling, erythema or ecchymosis present. No step-off, crepitus, or deformity appreciated. 5/5 muscle strength of LUE. 2+ radial pulse, sensation intact and all compartments soft.   Stocking in place on right lower extremity.  Lymphedema noted.  Patient reporting consistent with baseline.  Skin:    General: Skin is warm and dry.  Neurological:     Mental Status: He is alert.     Comments: Cranial nerves intact to gross observation. Patient moves extremities without difficulty.  Psychiatric:        Behavior: Behavior normal.        Thought Content: Thought content normal.        Judgment: Judgment normal.      ED Treatments / Results  Labs (all labs ordered are listed, but only abnormal results are displayed) Labs Reviewed  CBC WITH DIFFERENTIAL/PLATELET - Abnormal; Notable for the following components:      Result Value   WBC 3.9 (*)    All other components within normal limits  SEDIMENTATION RATE - Abnormal; Notable for the following components:   Sed Rate 37 (*)    All other components within normal limits  C-REACTIVE PROTEIN - Abnormal; Notable for the following components:   CRP 2.0 (*)    All other  components within normal limits  BASIC METABOLIC PANEL    EKG None  Radiology Dg Shoulder Left  Result Date: 06/16/2018 CLINICAL DATA:  Acute left shoulder pain without recent injury. EXAM: LEFT SHOULDER - 2+ VIEW COMPARISON:  Radiographs of October 08, 2017. FINDINGS: There is no evidence of fracture or dislocation. There is no evidence of arthropathy or other focal bone abnormality. Soft tissues are unremarkable. IMPRESSION: Negative. Electronically Signed   By: Marijo Conception, M.D.   On: 06/16/2018 09:23    Procedures Procedures (including critical care time)  Medications Ordered in ED Medications  acetaminophen (  TYLENOL) tablet 1,000 mg (1,000 mg Oral Given 06/16/18 0843)  methocarbamol (ROBAXIN) tablet 750 mg (750 mg Oral Given 06/16/18 0843)     Initial Impression / Assessment and Plan / ED Course  I have reviewed the triage vital signs and the nursing notes.  Pertinent labs & imaging results that were available during my care of the patient were reviewed by me and considered in my medical decision making (see chart for details).  Clinical Course as of Jun 16 1126  Wed Jun 16, 2018  1104 Spoke with Hilbert Odor, PA-C of orthopedics regarding patient case.  States that patient's ESR and CRP elevation are nonspecific, and that the cases symptoms and exam driven.  Recommends follow-up with [facility] later this week.   [AM]    Clinical Course User Index [AM] Albesa Seen, PA-C    Patient nontoxic-appearing, afebrile, and with appropriate range of motion of the shoulder peer differential diagnosis includes septic arthritis, osteomyelitis, rotator cuff impingement, biceps tendinitis, AC joint tendinitis.  Radiograph is without any acute abnormality.  Exam is not suggestive of septic arthritis.  Lab work significant for nonelevated WBC count.  Patient has marginal elevation of ESR and CRP.  This was discussed with Dr. Gerlene Fee as well as PA Hilbert Odor.   Consensus is patient should follow-up later this week with his primary orthopedist for recheck.  Do not suspect acute septic arthritis today.  Appreciate their involvement in the care of this patient.  Patient given strict return precautions for any interim development of decreased range of motion, erythema, edema, or fever or chills.  Patient is in understanding and agrees with plan of care.  Final Clinical Impressions(s) / ED Diagnoses   Final diagnoses:  Acute pain of left shoulder  Elevated erythrocyte sedimentation rate  CRP elevated    ED Discharge Orders         Ordered    methocarbamol (ROBAXIN) 500 MG tablet  2 times daily     06/16/18 1107           Albesa Seen, [place] 06/16/18 1132    Maudie Flakes, MD 06/16/18 1652

## 2018-06-20 ENCOUNTER — Emergency Department (HOSPITAL_COMMUNITY): Payer: Medicaid Other

## 2018-06-20 ENCOUNTER — Encounter (HOSPITAL_COMMUNITY): Payer: Self-pay | Admitting: Emergency Medicine

## 2018-06-20 ENCOUNTER — Emergency Department (HOSPITAL_COMMUNITY)
Admission: EM | Admit: 2018-06-20 | Discharge: 2018-06-20 | Disposition: A | Discharge status: Home / Self Care | Payer: Medicaid Other | Attending: Emergency Medicine | Admitting: Emergency Medicine

## 2018-06-20 DIAGNOSIS — R110 Nausea: Secondary | ICD-10-CM | POA: Insufficient documentation

## 2018-06-20 DIAGNOSIS — Z79899 Other long term (current) drug therapy: Secondary | ICD-10-CM | POA: Insufficient documentation

## 2018-06-20 DIAGNOSIS — R197 Diarrhea, unspecified: Secondary | ICD-10-CM | POA: Insufficient documentation

## 2018-06-20 DIAGNOSIS — I10 Essential (primary) hypertension: Secondary | ICD-10-CM | POA: Diagnosis not present

## 2018-06-20 DIAGNOSIS — R1013 Epigastric pain: Secondary | ICD-10-CM | POA: Diagnosis not present

## 2018-06-20 LAB — CBC WITH DIFFERENTIAL/PLATELET
ABS IMMATURE GRANULOCYTES: 0.01 10*3/uL (ref 0.00–0.07)
Basophils Absolute: 0 10*3/uL (ref 0.0–0.1)
Basophils Relative: 0 %
Eosinophils Absolute: 0.1 10*3/uL (ref 0.0–0.5)
Eosinophils Relative: 2 %
HEMATOCRIT: 41.7 % (ref 39.0–52.0)
Hemoglobin: 13.5 g/dL (ref 13.0–17.0)
Immature Granulocytes: 0 %
Lymphocytes Relative: 41 %
Lymphs Abs: 1.8 10*3/uL (ref 0.7–4.0)
MCH: 29.5 pg (ref 26.0–34.0)
MCHC: 32.4 g/dL (ref 30.0–36.0)
MCV: 91.2 fL (ref 80.0–100.0)
Monocytes Absolute: 0.4 10*3/uL (ref 0.1–1.0)
Monocytes Relative: 9 %
NEUTROS ABS: 2.2 10*3/uL (ref 1.7–7.7)
Neutrophils Relative %: 48 %
Platelets: 358 10*3/uL (ref 150–400)
RBC: 4.57 MIL/uL (ref 4.22–5.81)
RDW: 13.7 % (ref 11.5–15.5)
WBC: 4.5 10*3/uL (ref 4.0–10.5)
nRBC: 0 % (ref 0.0–0.2)

## 2018-06-20 LAB — I-STAT CHEM 8, ED
BUN: 13 mg/dL (ref 6–20)
CALCIUM ION: 1.21 mmol/L (ref 1.15–1.40)
Chloride: 102 mmol/L (ref 98–111)
Creatinine, Ser: 1.2 mg/dL (ref 0.61–1.24)
Glucose, Bld: 90 mg/dL (ref 70–99)
HCT: 42 % (ref 39.0–52.0)
Hemoglobin: 14.3 g/dL (ref 13.0–17.0)
Potassium: 3.6 mmol/L (ref 3.5–5.1)
Sodium: 138 mmol/L (ref 135–145)
TCO2: 29 mmol/L (ref 22–32)

## 2018-06-20 LAB — COMPREHENSIVE METABOLIC PANEL
ALT: 19 U/L (ref 0–44)
AST: 23 U/L (ref 15–41)
Albumin: 3.7 g/dL (ref 3.5–5.0)
Alkaline Phosphatase: 67 U/L (ref 38–126)
Anion gap: 9 (ref 5–15)
BILIRUBIN TOTAL: 0.5 mg/dL (ref 0.3–1.2)
BUN: 14 mg/dL (ref 6–20)
CO2: 27 mmol/L (ref 22–32)
CREATININE: 1.13 mg/dL (ref 0.61–1.24)
Calcium: 9 mg/dL (ref 8.9–10.3)
Chloride: 102 mmol/L (ref 98–111)
GFR calc Af Amer: >60 mL/min (ref >60)
GFR calc non Af Amer: >60 mL/min (ref >60)
Glucose, Bld: 93 mg/dL (ref 70–99)
Potassium: 3.5 mmol/L (ref 3.5–5.1)
Sodium: 138 mmol/L (ref 135–145)
TOTAL PROTEIN: 7.6 g/dL (ref 6.5–8.1)

## 2018-06-20 LAB — URINALYSIS, ROUTINE W REFLEX MICROSCOPIC
Bilirubin Urine: NEGATIVE
Glucose, UA: NEGATIVE mg/dL
Hgb urine dipstick: NEGATIVE
Ketones, ur: NEGATIVE mg/dL
Leukocytes, UA: NEGATIVE
Nitrite: NEGATIVE
Protein, ur: NEGATIVE mg/dL
Specific Gravity, Urine: >1.046 — ABNORMAL HIGH (ref 1.005–1.030)
pH: 5 (ref 5.0–8.0)

## 2018-06-20 LAB — I-STAT CG4 LACTIC ACID, ED: Lactic Acid, Venous: 0.9 mmol/L (ref 0.5–1.9)

## 2018-06-20 LAB — LIPASE, BLOOD: Lipase: 22 U/L (ref 11–51)

## 2018-06-20 MED ORDER — ONDANSETRON HCL 4 MG/2ML IJ SOLN
4.0000 mg | Freq: Once | INTRAMUSCULAR | Status: AC
  Administered 2018-06-20: 4 mg via INTRAVENOUS
  Filled 2018-06-20: qty 2

## 2018-06-20 MED ORDER — IOPAMIDOL (ISOVUE-300) INJECTION 61%
100.0000 mL | Freq: Once | INTRAVENOUS | Status: AC | PRN
  Administered 2018-06-20: 100 mL via INTRAVENOUS

## 2018-06-20 MED ORDER — IOPAMIDOL (ISOVUE-300) INJECTION 61%
INTRAVENOUS | Status: AC
  Filled 2018-06-20: qty 100

## 2018-06-20 MED ORDER — HYDROCODONE-ACETAMINOPHEN 5-325 MG PO TABS
1.0000 | ORAL_TABLET | Freq: Once | ORAL | Status: AC
  Administered 2018-06-20: 1 via ORAL
  Filled 2018-06-20: qty 1

## 2018-06-20 MED ORDER — SODIUM CHLORIDE (PF) 0.9 % IJ SOLN
INTRAMUSCULAR | Status: AC
  Filled 2018-06-20: qty 50

## 2018-06-20 MED ORDER — SUCRALFATE 1 G PO TABS: 1.0000 g | ORAL_TABLET | Freq: Three times a day (TID) | ORAL | 0 refills | Status: DC

## 2018-06-20 MED ORDER — HYDROMORPHONE HCL 1 MG/ML IJ SOLN
1.0000 mg | Freq: Once | INTRAMUSCULAR | Status: AC
  Administered 2018-06-20: 1 mg via INTRAVENOUS
  Filled 2018-06-20: qty 1

## 2018-06-20 MED ORDER — DICYCLOMINE HCL 10 MG/5ML PO SOLN
10.0000 mg | Freq: Once | ORAL | Status: AC
  Administered 2018-06-20: 10 mg via ORAL
  Filled 2018-06-20: qty 5

## 2018-06-20 MED ORDER — ALUM & MAG HYDROXIDE-SIMETH 200-200-20 MG/5ML PO SUSP
30.0000 mL | Freq: Once | ORAL | Status: AC
  Administered 2018-06-20: 30 mL via ORAL
  Filled 2018-06-20: qty 30

## 2018-06-20 MED ORDER — PANTOPRAZOLE SODIUM 20 MG PO TBEC: 20.0000 mg | DELAYED_RELEASE_TABLET | Freq: Every day | ORAL | 0 refills | Status: DC

## 2018-06-20 MED ORDER — ONDANSETRON 4 MG PO TBDP: 4.0000 mg | ORAL_TABLET | Freq: Three times a day (TID) | ORAL | 0 refills | Status: AC | PRN

## 2018-06-20 MED ORDER — SODIUM CHLORIDE 0.9 % IV BOLUS
500.0000 mL | Freq: Once | INTRAVENOUS | Status: AC
  Administered 2018-06-20: 500 mL via INTRAVENOUS

## 2018-06-20 NOTE — ED Triage Notes (Signed)
Pt c/o abd pains n/v/d that started this morning.

## 2018-06-20 NOTE — ED Notes (Signed)
Pt aware urine sample is needed 

## 2018-06-20 NOTE — ED Provider Notes (Signed)
[facility] DEPT Provider Note   CSN: [id number] Arrival date & time: 06/20/18  7510     History   Chief Complaint Chief Complaint  Patient presents with  . Abdominal Pain  . Emesis  . Diarrhea    HPI Jonathan Meadows is a 47 y.o. male.  HPI   47 yo M with h/o homelessness, schizophrenia, lymphedema here w/ abdominal pain, nausea, vomiting, diarrhea.  Pt states his sx started yesterday as mild nausea, poor appetite. Throughout the night and this AM, he's developed aching, gnawing, epigastric/diffuse abd pain that has worsened. He's had associated nausea but no vomiting, as well as significant increase in loose, but not overtly liquid, stool frequency. Pain seems worse with palpation and eating. No alleviating factors. Denies sick contacts or suspicious food intake. No fevers. No urinary sx. Denies EtOH use or known h/o gallstones or pancreatitis. No alleviating factors noted.  Past Medical History:  Diagnosis Date  . Anemia   . Cellulitis   . Homelessness   . Lymphedema of leg   . Paranoid schizophrenia ([place])    pt denies  . Septic joint of left shoulder region [facility]) 10/27/2017    Patient Active Problem List   Diagnosis Date Noted  . History of schizophrenia   . Septic joint of left shoulder region ([place]) 10/27/2017  . Adhesive capsulitis of left shoulder   . Effusion of left shoulder joint 10/10/2017  . Chest pain 10/08/2017  . GAD (generalized anxiety disorder) 06/07/2017  . Financial difficulties 06/07/2017  . Lymphedema 06/07/2017  . Gastric outlet obstruction 10/31/2016  . Chronic pain associated with significant psychosocial dysfunction 06/04/2016  . Schizophrenia, chronic condition with acute exacerbation ([place]) 09/11/2015  . Hypertension 12/03/2014  . Polysubstance abuse ([place]) 12/03/2014  . Schizoaffective disorder, bipolar type ([place]) 12/03/2014  . Back pain 08/11/2013  . Swollen leg 10/10/2012  . Anemia 10/10/2012    Past  Surgical History:  Procedure Laterality Date  . SHOULDER ARTHROSCOPY Left 10/11/2017   Procedure: ARTHROSCOPY SHOULDER, LEFT;  Surgeon: Newt Minion, MD;  Location: [facility];  Service: Orthopedics;  Laterality: Left;  . SHOULDER ARTHROSCOPY WITH SUBACROMIAL DECOMPRESSION, ROTATOR CUFF REPAIR AND BICEP TENDON REPAIR Left 11/24/2017   Procedure: LEFT SHOULDER ARTHROSCOPY, OPEN DISTAL CLAVICLE EXCISION, EXTENSIVE INTRA-ARTICULAR AND EXTRA-ARTICULAR DEBRIDEMENT;  Surgeon: [name], MD;  Location: [facility];  Service: Orthopedics;  Laterality: Left;        Home Medications    Prior to Admission medications   Medication Sig Start Date End Date Taking? Authorizing Provider  B Complex-C (SUPER B COMPLEX PO) Take 1 tablet by mouth daily.    [provider]  GINSENG PO Take 1 tablet by mouth daily.    [provider]  ibuprofen (ADVIL,MOTRIN) 600 MG tablet Take 1 tablet (600 mg total) by mouth every 6 (six) hours as needed. 05/16/18   McDonald, Mia A, PA-C  methocarbamol (ROBAXIN) 500 MG tablet Take 1 tablet (500 mg total) by mouth 2 (two) times daily. 06/16/18   Langston Masker B, PA-C  omega-3 acid ethyl esters (LOVAZA) 1 g capsule Take 1 g by mouth daily.    [provider]  ondansetron (ZOFRAN ODT) 4 MG disintegrating tablet Take 1 tablet (4 mg total) by mouth every 8 (eight) hours as needed for nausea or vomiting. 06/20/18   Duffy Bruce, MD  pantoprazole (PROTONIX) 20 MG tablet Take 1 tablet (20 mg total) by mouth daily for 7 days. 06/20/18 06/27/18  Duffy Bruce, MD  sucralfate (CARAFATE) 1 g tablet Take 1 tablet (1 g total) by mouth 4 (four) times daily -  with meals and at bedtime for 7 days. 06/20/18 06/27/18  Duffy Bruce, MD    Family History Family History  Problem Relation Age of Onset  . CAD Other        Neg HX  . Hypertension Other        Neg Hx  . Diabetes Other        Neg Hx  . Cancer Other        Neg Hx  . Lupus Mother     Social  History Social History   Tobacco Use  . Smoking status: Never Smoker  . Smokeless tobacco: Never Used  Substance Use Topics  . Alcohol use: No  . Drug use: No     Allergies   Pork-derived products   Review of Systems Review of Systems  Constitutional: Positive for fatigue. Negative for chills and fever.  HENT: Negative for congestion and rhinorrhea.   Eyes: Negative for visual disturbance.  Respiratory: Negative for cough, shortness of breath and wheezing.   Cardiovascular: Negative for chest pain and leg swelling.  Gastrointestinal: Positive for abdominal pain, diarrhea, nausea and vomiting.  Genitourinary: Negative for dysuria and flank pain.  Musculoskeletal: Negative for neck pain and neck stiffness.  Skin: Negative for rash and wound.  Allergic/Immunologic: Negative for immunocompromised state.  Neurological: Negative for syncope, weakness and headaches.  All other systems reviewed and are negative.    Physical Exam Updated Vital Signs BP (!) 130/103 (BP Location: Right Arm)   Pulse 81   Temp 97.8 F (36.6 C) (Oral)   Resp 18   SpO2 98%   Physical Exam Vitals signs and nursing note reviewed.  Constitutional:      General: He is not in acute distress.    Appearance: He is well-developed.  HENT:     Head: Normocephalic and atraumatic.  Eyes:     Conjunctiva/sclera: Conjunctivae normal.  Neck:     Musculoskeletal: Neck supple.  Cardiovascular:     Rate and Rhythm: Normal rate and regular rhythm.     Heart sounds: Normal heart sounds. No murmur. No friction rub.  Pulmonary:     Effort: Pulmonary effort is normal. No respiratory distress.     Breath sounds: Normal breath sounds. No wheezing or rales.  Abdominal:     General: There is no distension.     Palpations: Abdomen is soft.     Tenderness: There is generalized abdominal tenderness and tenderness in the epigastric area and periumbilical area. There is guarding. There is no right CVA tenderness, left  CVA tenderness or rebound.  Skin:    General: Skin is warm.     Capillary Refill: Capillary refill takes less than 2 seconds.  Neurological:     Mental Status: He is alert and oriented to person, place, and time.     Motor: No abnormal muscle tone.      ED Treatments / Results  Labs (all labs ordered are listed, but only abnormal results are displayed) Labs Reviewed  URINALYSIS, ROUTINE W REFLEX MICROSCOPIC - Abnormal; Notable for the following components:      Result Value   Color, Urine STRAW (*)    Specific Gravity, Urine >1.046 (*)    All other components within normal limits  CBC WITH DIFFERENTIAL/PLATELET  COMPREHENSIVE METABOLIC PANEL  LIPASE, BLOOD  I-STAT CHEM 8, ED  I-STAT CG4 LACTIC ACID, ED  I-STAT  CG4 LACTIC ACID, ED    EKG None  Radiology Ct Abdomen Pelvis W Contrast  Result Date: 06/20/2018 CLINICAL DATA:  Abdominal pain, nausea and vomiting EXAM: CT ABDOMEN AND PELVIS WITH CONTRAST TECHNIQUE: Multidetector CT imaging of the abdomen and pelvis was performed using the standard protocol following bolus administration of intravenous contrast. CONTRAST:  114mL ISOVUE-300 IOPAMIDOL (ISOVUE-300) INJECTION 61% COMPARISON:  07/18/2017 FINDINGS: Lower chest: No acute abnormality. Hepatobiliary: No focal liver abnormality is seen. No gallstones, gallbladder wall thickening, or biliary dilatation. Pancreas: Unremarkable. No pancreatic ductal dilatation or surrounding inflammatory changes. Spleen: Normal in size without focal abnormality. Adrenals/Urinary Tract: Normal adrenal glands. No renal obstruction, hydronephrosis, acute obstructive uropathy, hydroureter, ureteral calculus, or UVJ abnormality. Bladder collapsed. Stable incidental 10 mm hypodense cysts in the right kidney. Stomach/Bowel: Stomach is within normal limits. Appendix appears normal. No evidence of bowel wall thickening, distention, or inflammatory changes. Vascular/Lymphatic: No significant vascular findings  are present. No enlarged abdominal or pelvic lymph nodes. Reproductive: Unremarkable by CT Other: No abdominal wall hernia or abnormality. No abdominopelvic ascites. Musculoskeletal: Similar chronic appearing right proximal thigh strandy subcutaneous edema, nonspecific but may represent sequelae from prior DVT in right leg. Similar prominent likely reactive right inguinal lymph nodes. No acute osseous finding. IMPRESSION: No acute intra-abdominal pelvic finding by CT. Chronic appearing right proximal thigh subcutaneous strandy edema suspect sequelae from prior right lower extremity DVT. Electronically Signed   By: Jerilynn Mages.  Shick M.D.   On: 06/20/2018 11:21    Procedures Procedures (including critical care time)  Medications Ordered in ED Medications  iopamidol (ISOVUE-300) 61 % injection (has no administration in time range)  sodium chloride (PF) 0.9 % injection (has no administration in time range)  ondansetron (ZOFRAN) injection 4 mg (4 mg Intravenous Given 06/20/18 1029)  HYDROmorphone (DILAUDID) injection 1 mg (1 mg Intravenous Given 06/20/18 1029)  sodium chloride 0.9 % bolus 500 mL (0 mLs Intravenous Stopped 06/20/18 1202)  iopamidol (ISOVUE-300) 61 % injection 100 mL (100 mLs Intravenous Contrast Given 06/20/18 1052)  alum & mag hydroxide-simeth (MAALOX/MYLANTA) 200-200-20 MG/5ML suspension 30 mL (30 mLs Oral Given 06/20/18 1211)  dicyclomine (BENTYL) 10 MG/5ML syrup 10 mg (10 mg Oral Given 06/20/18 1211)     Initial Impression / Assessment and Plan / ED Course  I have reviewed the triage vital signs and the nursing notes.  Pertinent labs & imaging results that were available during my care of the patient were reviewed by me and considered in my medical decision making (see chart for details).  Clinical Course as of Jun 20 1321  Sun Jun 20, 2018  1019 47 yo M here w/ diffuse abd pain, nausea, diarrhea. Suspect viral GI illness vs food borne illness, less likely pancreatitis,  cholecystitis, gastritis. IVF ordered, labs and imaging pending.   [CI]  1251 CT scan neg for acute abnormality. Labs reviewed - normal CBC, chem, and lipase. Suspect viral GI vs food-borne vs gastritis. Feels much better here in ED and is tolerating PO. Will d/c with antacids, antiemetics, outpt follow-up. Return precautions given.   [CI]    Clinical Course User Index [CI] Duffy Bruce, MD     Final Clinical Impressions(s) / ED Diagnoses   Final diagnoses:  Epigastric pain  Diarrhea, unspecified type    ED Discharge Orders         Ordered    ondansetron (ZOFRAN ODT) 4 MG disintegrating tablet  Every 8 hours PRN     06/20/18 1321    pantoprazole (PROTONIX)  20 MG tablet  Daily     06/20/18 1321    sucralfate (CARAFATE) 1 g tablet  3 times daily with meals & bedtime     06/20/18 1321           Duffy Bruce, MD 06/20/18 1322

## 2018-06-26 ENCOUNTER — Emergency Department (HOSPITAL_COMMUNITY)
Admission: EM | Admit: 2018-06-26 | Discharge: 2018-06-26 | Disposition: A | Discharge status: Home / Self Care | Payer: Medicaid Other | Attending: Emergency Medicine | Admitting: Emergency Medicine

## 2018-06-26 ENCOUNTER — Encounter (HOSPITAL_COMMUNITY): Payer: Self-pay

## 2018-06-26 DIAGNOSIS — Z79899 Other long term (current) drug therapy: Secondary | ICD-10-CM | POA: Diagnosis not present

## 2018-06-26 DIAGNOSIS — R197 Diarrhea, unspecified: Secondary | ICD-10-CM | POA: Insufficient documentation

## 2018-06-26 DIAGNOSIS — M79601 Pain in right arm: Secondary | ICD-10-CM | POA: Insufficient documentation

## 2018-06-26 LAB — CBC
HCT: 44.1 % (ref 39.0–52.0)
HEMOGLOBIN: 14.2 g/dL (ref 13.0–17.0)
MCH: 29.3 pg (ref 26.0–34.0)
MCHC: 32.2 g/dL (ref 30.0–36.0)
MCV: 90.9 fL (ref 80.0–100.0)
Platelets: 182 10*3/uL (ref 150–400)
RBC: 4.85 MIL/uL (ref 4.22–5.81)
RDW: 13.2 % (ref 11.5–15.5)
WBC: 4.1 10*3/uL (ref 4.0–10.5)
nRBC: 0 % (ref 0.0–0.2)

## 2018-06-26 LAB — URINALYSIS, ROUTINE W REFLEX MICROSCOPIC
Bilirubin Urine: NEGATIVE
Glucose, UA: NEGATIVE mg/dL
HGB URINE DIPSTICK: NEGATIVE
Ketones, ur: NEGATIVE mg/dL
Leukocytes, UA: NEGATIVE
Nitrite: NEGATIVE
Protein, ur: NEGATIVE mg/dL
SPECIFIC GRAVITY, URINE: 1.01 (ref 1.005–1.030)
pH: 6 (ref 5.0–8.0)

## 2018-06-26 LAB — COMPREHENSIVE METABOLIC PANEL
ALK PHOS: 65 U/L (ref 38–126)
ALT: 22 U/L (ref 0–44)
AST: 32 U/L (ref 15–41)
Albumin: 3.6 g/dL (ref 3.5–5.0)
Anion gap: 10 (ref 5–15)
BUN: 9 mg/dL (ref 6–20)
CO2: 28 mmol/L (ref 22–32)
Calcium: 9.3 mg/dL (ref 8.9–10.3)
Chloride: 100 mmol/L (ref 98–111)
Creatinine, Ser: 1.23 mg/dL (ref 0.61–1.24)
GFR calc Af Amer: >60 mL/min (ref >60)
GFR calc non Af Amer: >60 mL/min (ref >60)
Glucose, Bld: 104 mg/dL — ABNORMAL HIGH (ref 70–99)
Potassium: 3.8 mmol/L (ref 3.5–5.1)
Sodium: 138 mmol/L (ref 135–145)
Total Bilirubin: 0.7 mg/dL (ref 0.3–1.2)
Total Protein: 8.2 g/dL — ABNORMAL HIGH (ref 6.5–8.1)

## 2018-06-26 LAB — CK: CK TOTAL: 319 U/L (ref 49–397)

## 2018-06-26 LAB — LIPASE, BLOOD: Lipase: 21 U/L (ref 11–51)

## 2018-06-26 MED ORDER — ONDANSETRON 4 MG PO TBDP
4.0000 mg | ORAL_TABLET | Freq: Once | ORAL | Status: AC
  Administered 2018-06-26: 4 mg via ORAL
  Filled 2018-06-26: qty 1

## 2018-06-26 MED ORDER — FUROSEMIDE 20 MG PO TABS
20.0000 mg | ORAL_TABLET | Freq: Once | ORAL | Status: AC
  Administered 2018-06-26: 20 mg via ORAL
  Filled 2018-06-26: qty 1

## 2018-06-26 NOTE — ED Triage Notes (Signed)
Pt presents for evaluation of diarrhea this morning, denies n/v. Reports some abd cramping. No known sick contacts. Pt reports some R arm cramping that started 2-3 days ago, intermittent. Denies injury or trauma.

## 2018-06-26 NOTE — ED Provider Notes (Signed)
[facility] EMERGENCY DEPARTMENT Provider Note   CSN: [id number] Arrival date & time: 06/26/18  2355     History   Chief Complaint Chief Complaint  Patient presents with  . Arm Pain  . Diarrhea    HPI Jonathan Meadows is a 47 y.o. male with a history of paranoid schizophrenia and septic left shoulder who presents to the emergency department with a chief complaint of diarrhea.  The patient endorses a 7-8 episodes of nonbloody diarrhea, onset this morning with associated nausea and epigastric abdominal pain.  He is unable to characterize the pain.  No known aggravating or alleviating factors.  He denies vomiting, fever, chills, dysuria, penile or testicular pain or swelling, constipation, chest pain, dyspnea.  He also reports he has been having some intermittent spasms in the muscles of his right upper arm for the last few days.  He also notes some cramping in his bilateral lower legs for the last 2-3 nights.  No treatment prior to arrival.  No history of similar.  He also reports that he has a prescription for his home Lasix, but was unable to make it to the pharmacy yesterday.  He is requesting a dose here.   The history is provided by the patient. No language interpreter was used.    Past Medical History:  Diagnosis Date  . Anemia   . Cellulitis   . Homelessness   . Lymphedema of leg   . Paranoid schizophrenia ([place])    pt denies  . Septic joint of left shoulder region [facility]) 10/27/2017    Patient Active Problem List   Diagnosis Date Noted  . History of schizophrenia   . Septic joint of left shoulder region ([place]) 10/27/2017  . Adhesive capsulitis of left shoulder   . Effusion of left shoulder joint 10/10/2017  . Chest pain 10/08/2017  . GAD (generalized anxiety disorder) 06/07/2017  . Financial difficulties 06/07/2017  . Lymphedema 06/07/2017  . Gastric outlet obstruction 10/31/2016  . Chronic pain associated with significant psychosocial dysfunction  06/04/2016  . Schizophrenia, chronic condition with acute exacerbation ([place]) 09/11/2015  . Hypertension 12/03/2014  . Polysubstance abuse ([place]) 12/03/2014  . Schizoaffective disorder, bipolar type ([place]) 12/03/2014  . Back pain 08/11/2013  . Swollen leg 10/10/2012  . Anemia 10/10/2012    Past Surgical History:  Procedure Laterality Date  . SHOULDER ARTHROSCOPY Left 10/11/2017   Procedure: ARTHROSCOPY SHOULDER, LEFT;  Surgeon: Newt Minion, MD;  Location: [place];  Service: Orthopedics;  Laterality: Left;  . SHOULDER ARTHROSCOPY WITH SUBACROMIAL DECOMPRESSION, ROTATOR CUFF REPAIR AND BICEP TENDON REPAIR Left 11/24/2017   Procedure: LEFT SHOULDER ARTHROSCOPY, OPEN DISTAL CLAVICLE EXCISION, EXTENSIVE INTRA-ARTICULAR AND EXTRA-ARTICULAR DEBRIDEMENT;  Surgeon: [name], MD;  Location: [place];  Service: Orthopedics;  Laterality: Left;        Home Medications    Prior to Admission medications   Medication Sig Start Date End Date Taking? Authorizing Provider  B Complex-C (SUPER B COMPLEX PO) Take 1 tablet by mouth daily.   Yes [provider]  GINSENG PO Take 1 tablet by mouth daily.   Yes [provider]  ibuprofen (ADVIL,MOTRIN) 600 MG tablet Take 1 tablet (600 mg total) by mouth every 6 (six) hours as needed. Patient taking differently: Take 600 mg by mouth every 6 (six) hours as needed for fever or headache.  05/16/18  Yes Lennin Osmond A, PA-C  omega-3 acid ethyl esters (LOVAZA) 1 g capsule Take 1 g by mouth daily.  Yes [provider]  ondansetron (ZOFRAN ODT) 4 MG disintegrating tablet Take 1 tablet (4 mg total) by mouth every 8 (eight) hours as needed for nausea or vomiting. 06/20/18  Yes Duffy Bruce, MD    Family History Family History  Problem Relation Age of Onset  . CAD Other        Neg HX  . Hypertension Other        Neg Hx  . Diabetes Other        Neg Hx  . Cancer Other        Neg Hx  . Lupus Mother     Social History Social  History   Tobacco Use  . Smoking status: Never Smoker  . Smokeless tobacco: Never Used  Substance Use Topics  . Alcohol use: No  . Drug use: No     Allergies   Pork-derived products   Review of Systems Review of Systems  Constitutional: Negative for appetite change and fever.  HENT: Negative for congestion and sinus pressure.   Respiratory: Negative for shortness of breath.   Cardiovascular: Positive for leg swelling (chronic RLE). Negative for chest pain.  Gastrointestinal: Positive for diarrhea. Negative for abdominal pain, anal bleeding, blood in stool, constipation, nausea and vomiting.  Genitourinary: Negative for dysuria.  Musculoskeletal: Positive for myalgias (RUE spasms/twitching). Negative for back pain.  Skin: Negative for rash.  Allergic/Immunologic: Negative for immunocompromised state.  Neurological: Negative for dizziness, weakness, numbness and headaches.  Psychiatric/Behavioral: Negative for confusion.     Physical Exam Updated Vital Signs BP 119/77   Pulse 68   Temp 98.2 F (36.8 C) (Oral)   Resp 14   SpO2 100%   Physical Exam Vitals signs and nursing note reviewed.  Constitutional:      Appearance: He is well-developed.  HENT:     Head: Normocephalic.  Eyes:     Conjunctiva/sclera: Conjunctivae normal.  Neck:     Musculoskeletal: Neck supple.  Cardiovascular:     Rate and Rhythm: Normal rate and regular rhythm.     Heart sounds: No murmur. No friction rub. No gallop.   Pulmonary:     Effort: Pulmonary effort is normal. No respiratory distress.     Breath sounds: No stridor. No wheezing, rhonchi or rales.  Chest:     Chest wall: No tenderness.  Abdominal:     General: There is no distension.     Palpations: Abdomen is soft. There is no mass.     Tenderness: There is no abdominal tenderness. There is no right CVA tenderness, left CVA tenderness, guarding or rebound.     Hernia: No hernia is present.     Comments: Abdomen is soft,  nontender, nondistended.  No rebound or guarding.  No CVA tenderness bilaterally.  Musculoskeletal:        General: Swelling present. No tenderness.     Comments: Chronic right lower extremity lymphedema.  Compression stocking is in place.  No fasciculation or spasms noted to the bilateral upper extremities.  Radial pulses and DP pulses are 2+ and symmetric.  5-5 strength against resistance of the bilateral upper extremities.  Sensation is intact and equal throughout.  Skin:    General: Skin is warm and dry.  Neurological:     Mental Status: He is alert.  Psychiatric:        Behavior: Behavior normal.      ED Treatments / Results  Labs (all labs ordered are listed, but only abnormal results are displayed) Labs  Reviewed  COMPREHENSIVE METABOLIC PANEL - Abnormal; Notable for the following components:      Result Value   Glucose, Bld 104 (*)    Total Protein 8.2 (*)    All other components within normal limits  CBC  LIPASE, BLOOD  CK  URINALYSIS, ROUTINE W REFLEX MICROSCOPIC    EKG None  Radiology No results found.  Procedures Procedures (including critical care time)  Medications Ordered in ED Medications  ondansetron (ZOFRAN-ODT) disintegrating tablet 4 mg (4 mg Oral Given 06/26/18 0910)  furosemide (LASIX) tablet 20 mg (20 mg Oral Given 06/26/18 1056)     Initial Impression / Assessment and Plan / ED Course  I have reviewed the triage vital signs and the nursing notes.  Pertinent labs & imaging results that were available during my care of the patient were reviewed by me and considered in my medical decision making (see chart for details).     47 year old male with a history of paranoid schizophrenia and septic left shoulder presenting with nonbloody diarrhea and nausea.  He also reports intermittent twitching of his right upper extremity over the last few days.  On exam, abdomen is nonsurgical and exam is reassuring.  Labs are unremarkable.  CK level is normal.   No electrolyte abnormalities.  I also did not note any muscle spasming or fasciculations on my exam.  He is requesting a dose of his home Lasix as he has a prescription, but has not been able to get it filled.  Doubt C. difficile, viral gastroenteritis, colitis, CVA at this time.  Recommended the patient follow-up with primary care if symptoms persist in the next few days.  He does not appear dehydrated.  He is hemodynamically stable and in no acute distress.  He is safe for discharge home with outpatient follow-up at this time.  Final Clinical Impressions(s) / ED Diagnoses   Final diagnoses:  Musculoskeletal arm pain, right  Diarrhea, unspecified type    ED Discharge Orders    None       Joanne Gavel, PA-C 06/26/18 1625    Long, Wonda Olds, MD 06/26/18 2033

## 2018-06-26 NOTE — Discharge Instructions (Signed)
Thank you for allowing me to care for you today in the Emergency Department.   Make sure to drink plenty of fluids if you are having diarrhea to avoid dehydration.  Take your home Lasix as prescribed.  The pharmacy reopens on Monday.  You can take Tylenol for pain in your right arm.  Take 650 mg every 6 hours.  Follow-up with primary care if your symptoms do not start to improve in the next week.  Return to the emergency department if you develop high fevers, numbness in your right arm, blood in your diarrhea, or other new, concerning symptoms.

## 2018-07-05 ENCOUNTER — Emergency Department (HOSPITAL_COMMUNITY)
Admission: EM | Admit: 2018-07-05 | Discharge: 2018-07-05 | Disposition: A | Discharge status: Home / Self Care | Payer: Medicaid Other | Attending: Emergency Medicine | Admitting: Emergency Medicine

## 2018-07-05 ENCOUNTER — Ambulatory Visit (HOSPITAL_BASED_OUTPATIENT_CLINIC_OR_DEPARTMENT_OTHER)
Admit: 2018-07-05 | Discharge: 2018-07-05 | Disposition: A | Discharge status: Home / Self Care | Payer: Medicaid Other | Attending: Emergency Medicine | Admitting: Emergency Medicine

## 2018-07-05 ENCOUNTER — Encounter (HOSPITAL_COMMUNITY): Payer: Self-pay | Admitting: Emergency Medicine

## 2018-07-05 DIAGNOSIS — R609 Edema, unspecified: Secondary | ICD-10-CM | POA: Diagnosis not present

## 2018-07-05 DIAGNOSIS — M7989 Other specified soft tissue disorders: Secondary | ICD-10-CM | POA: Diagnosis not present

## 2018-07-05 DIAGNOSIS — Z79899 Other long term (current) drug therapy: Secondary | ICD-10-CM | POA: Diagnosis not present

## 2018-07-05 DIAGNOSIS — M79604 Pain in right leg: Secondary | ICD-10-CM | POA: Diagnosis present

## 2018-07-05 DIAGNOSIS — I10 Essential (primary) hypertension: Secondary | ICD-10-CM | POA: Diagnosis not present

## 2018-07-05 DIAGNOSIS — I890 Lymphedema, not elsewhere classified: Secondary | ICD-10-CM | POA: Diagnosis not present

## 2018-07-05 DIAGNOSIS — R52 Pain, unspecified: Secondary | ICD-10-CM | POA: Diagnosis not present

## 2018-07-05 LAB — URINALYSIS, ROUTINE W REFLEX MICROSCOPIC
Bilirubin Urine: NEGATIVE
Glucose, UA: NEGATIVE mg/dL
Hgb urine dipstick: NEGATIVE
Ketones, ur: NEGATIVE mg/dL
Leukocytes, UA: NEGATIVE
Nitrite: NEGATIVE
Protein, ur: NEGATIVE mg/dL
Specific Gravity, Urine: 1.011 (ref 1.005–1.030)
pH: 5 (ref 5.0–8.0)

## 2018-07-05 MED ORDER — HYDROCODONE-ACETAMINOPHEN 5-325 MG PO TABS: 2.0000 | ORAL_TABLET | ORAL | 0 refills | Status: AC | PRN

## 2018-07-05 MED ORDER — HYDROCODONE-ACETAMINOPHEN 5-325 MG PO TABS
1.0000 | ORAL_TABLET | Freq: Once | ORAL | Status: AC
  Administered 2018-07-05: 1 via ORAL
  Filled 2018-07-05: qty 1

## 2018-07-05 NOTE — ED Provider Notes (Signed)
[place] DEPT Provider Note   CSN: [id number] Arrival date & time: 07/05/18  1050     History   Chief Complaint Chief Complaint  Patient presents with  . Abdominal Pain  . Groin Pain  . Leg Swelling    HPI Jonathan Meadows is a 48 y.o. male.  48 year old male with prior medical history as detailed below presents for evaluation of right leg pain.  Patient reports longstanding history of lymphedema of the right leg.  He reports increased edema over the last 3 to 4 days.  He complains of pain to the leg secondary to the increased edema.  He denies fever.  He denies shortness of breath.  He denies other complaint.  He has not taken anything recently for his pain.  He does report that in the past he has had to use Percocet for pain control.  He is currently out of Percocet.  The history is provided by the patient and medical records.  Illness  Location:  Right leg pain - edema  Severity:  Mild Onset quality:  Gradual Duration:  4 days Timing:  Constant Progression:  Waxing and waning Chronicity:  Recurrent Associated symptoms: no chest pain, no fever and no shortness of breath     Past Medical History:  Diagnosis Date  . Anemia   . Cellulitis   . Homelessness   . Lymphedema of leg   . Paranoid schizophrenia ([place])    pt denies  . Septic joint of left shoulder region [facility]) 10/27/2017    Patient Active Problem List   Diagnosis Date Noted  . History of schizophrenia   . Septic joint of left shoulder region ([place]) 10/27/2017  . Adhesive capsulitis of left shoulder   . Effusion of left shoulder joint 10/10/2017  . Chest pain 10/08/2017  . GAD (generalized anxiety disorder) 06/07/2017  . Financial difficulties 06/07/2017  . Lymphedema 06/07/2017  . Gastric outlet obstruction 10/31/2016  . Chronic pain associated with significant psychosocial dysfunction 06/04/2016  . Schizophrenia, chronic condition with acute exacerbation ([place]) 09/11/2015  .  Hypertension 12/03/2014  . Polysubstance abuse ([place]) 12/03/2014  . Schizoaffective disorder, bipolar type ([place]) 12/03/2014  . Back pain 08/11/2013  . Swollen leg 10/10/2012  . Anemia 10/10/2012    Past Surgical History:  Procedure Laterality Date  . SHOULDER ARTHROSCOPY Left 10/11/2017   Procedure: ARTHROSCOPY SHOULDER, LEFT;  Surgeon: Newt Minion, MD;  Location: [place];  Service: Orthopedics;  Laterality: Left;  . SHOULDER ARTHROSCOPY WITH SUBACROMIAL DECOMPRESSION, ROTATOR CUFF REPAIR AND BICEP TENDON REPAIR Left 11/24/2017   Procedure: LEFT SHOULDER ARTHROSCOPY, OPEN DISTAL CLAVICLE EXCISION, EXTENSIVE INTRA-ARTICULAR AND EXTRA-ARTICULAR DEBRIDEMENT;  Surgeon: [name], MD;  Location: [place];  Service: Orthopedics;  Laterality: Left;        Home Medications    Prior to Admission medications   Medication Sig Start Date End Date Taking? Authorizing Provider  B Complex-C (SUPER B COMPLEX PO) Take 1 tablet by mouth daily.    [provider]  GINSENG PO Take 1 tablet by mouth daily.    [provider]  HYDROcodone-acetaminophen (NORCO/VICODIN) 5-325 MG tablet Take 2 tablets by mouth every 4 (four) hours as needed. 07/05/18   Valarie Merino, MD  ibuprofen (ADVIL,MOTRIN) 600 MG tablet Take 1 tablet (600 mg total) by mouth every 6 (six) hours as needed. Patient taking differently: Take 600 mg by mouth every 6 (six) hours as needed for fever or headache.  05/16/18   Joline Maxcy  A, PA-C  omega-3 acid ethyl esters (LOVAZA) 1 g capsule Take 1 g by mouth daily.    [provider]  ondansetron (ZOFRAN ODT) 4 MG disintegrating tablet Take 1 tablet (4 mg total) by mouth every 8 (eight) hours as needed for nausea or vomiting. 06/20/18   Duffy Bruce, MD    Family History Family History  Problem Relation Age of Onset  . CAD Other        Neg HX  . Hypertension Other        Neg Hx  . Diabetes Other        Neg Hx  . Cancer Other        Neg Hx  .  Lupus Mother     Social History Social History   Tobacco Use  . Smoking status: Never Smoker  . Smokeless tobacco: Never Used  Substance Use Topics  . Alcohol use: No  . Drug use: No     Allergies   Pork-derived products   Review of Systems Review of Systems  Constitutional: Negative for fever.  Respiratory: Negative for shortness of breath.   Cardiovascular: Negative for chest pain.  All other systems reviewed and are negative.    Physical Exam Updated Vital Signs BP (!) 141/95 (BP Location: Left Arm)   Pulse 60   Temp 97.9 F (36.6 C)   Resp 16   Ht 6\' 1"  (1.854 m)   Wt 110 kg   SpO2 100%   BMI 32.00 kg/m   Physical Exam Vitals signs and nursing note reviewed.  Constitutional:      General: He is not in acute distress.    Appearance: He is well-developed.  HENT:     Head: Normocephalic and atraumatic.  Eyes:     Conjunctiva/sclera: Conjunctivae normal.     Pupils: Pupils are equal, round, and reactive to light.  Neck:     Musculoskeletal: Normal range of motion and neck supple.  Cardiovascular:     Rate and Rhythm: Normal rate and regular rhythm.     Heart sounds: Normal heart sounds.  Pulmonary:     Effort: Pulmonary effort is normal. No respiratory distress.     Breath sounds: Normal breath sounds.  Abdominal:     General: There is no distension.     Palpations: Abdomen is soft.     Tenderness: There is no abdominal tenderness.  Musculoskeletal: Normal range of motion.        General: No deformity.  Skin:    General: Skin is warm and dry.     Comments: Moderate lymphedema noted of the right lower extremity  Neurological:     Mental Status: He is alert and oriented to person, place, and time.      ED Treatments / Results  Labs (all labs ordered are listed, but only abnormal results are displayed) Labs Reviewed  URINALYSIS, ROUTINE W REFLEX MICROSCOPIC    EKG None  Radiology Vas [place] Lower Extremity Venous (dvt) (only [facility]  7a-7p)  Result Date: 07/05/2018  Lower Venous Study Indications: Swelling, Pain, and Edema.  Limitations: Body habitus, musculoskeletal features and hardening of the whole symptomatic leg. Comparison Study: Negative study of Lower extremity venous duplex on 04/05/2018;                   Upper extremity venous duplex on 10/09/2017 Performing Technologist: Rudell Cobb  Examination Guidelines: A complete evaluation includes B-mode imaging, spectral Doppler, color Doppler, and power Doppler as needed  of all accessible portions of each vessel. Bilateral testing is considered an integral part of a complete examination. Limited examinations for reoccurring indications may be performed as noted.  Right Venous Findings: +---------+---------------+---------+-----------+----------+-------------------+          CompressibilityPhasicitySpontaneityPropertiesSummary             +---------+---------------+---------+-----------+----------+-------------------+ CFV      Full           Yes      Yes                                      +---------+---------------+---------+-----------+----------+-------------------+ SFJ      Full                                                             +---------+---------------+---------+-----------+----------+-------------------+ FV Prox  Full           Yes      Yes                                      +---------+---------------+---------+-----------+----------+-------------------+ FV Mid                  Yes      Yes                  very difficult to                                                         see with                                                                  compression         +---------+---------------+---------+-----------+----------+-------------------+ FV Distal               Yes      Yes                                      +---------+---------------+---------+-----------+----------+-------------------+ POP       Full           Yes      Yes                  poor flow           +---------+---------------+---------+-----------+----------+-------------------+ PTV                              No                   not well visualized +---------+---------------+---------+-----------+----------+-------------------+ PERO  No                   not well visualized +---------+---------------+---------+-----------+----------+-------------------+ Multiple oval shaped hypoechoic non-vascular structure seen at right groin area to proximal thigh. Etiology unknown.  Left Venous Findings: +---+---------------+---------+-----------+----------+-------+    CompressibilityPhasicitySpontaneityPropertiesSummary +---+---------------+---------+-----------+----------+-------+ CFVFull           Yes      Yes                          +---+---------------+---------+-----------+----------+-------+    Summary: Right: ---No obvious evidence of deep veins thrombosis visualized. However, study limited due to patient body habitus and severe tightening of the whole right lower extremity, some segments unable to see or poor flow detected, study cannot completely exclude the possible thrombosis existent at those unseen segments. ---Multiple oval shaped hypoechoic non-vascular structure seen at right groin area to proximal thigh. Etiology unknown. ---No cystic structure seen at popliteal fossa. Left: No evidence of common femoral vein obstruction.  *See table(s) above for measurements and observations.    Preliminary     Procedures Procedures (including critical care time)  Medications Ordered in ED Medications  HYDROcodone-acetaminophen (NORCO/VICODIN) 5-325 MG per tablet 1 tablet (1 tablet Oral Given 07/05/18 1205)     Initial Impression / Assessment and Plan / ED Course  I have reviewed the triage vital signs and the nursing notes.  Pertinent labs & imaging results that were available  during my care of the patient were reviewed by me and considered in my medical decision making (see chart for details).     MDM  Screen complete  Patient is presenting for evaluation of acute on chronic right lower extremity pain and discomfort.  Patient does report increased edema to the right lower extremity in the setting of chronic lymphedema.  Ultrasound does not demonstrate DVT.  Patient's reported discomfort is likely secondary to increased edema.  Patient is advised to wear compression stockings and elevate the limb.  He will be prescribed a small number of norco for pain control.  Portance of close follow-up was stressed.  Strict return precautions given and understood.  Final Clinical Impressions(s) / ED Diagnoses   Final diagnoses:  Lymphedema    ED Discharge Orders         Ordered    HYDROcodone-acetaminophen (NORCO/VICODIN) 5-325 MG tablet  Every 4 hours PRN     07/05/18 1445           Valarie Merino, MD 07/05/18 1449

## 2018-07-05 NOTE — Progress Notes (Signed)
Lower extremity venous duplex exam completed. Please see preliminary notes on CV PROC under chart review. Marlen Mollica H Chidi Shirer(RDMS RVT) 07/05/18 4:54 PM

## 2018-07-05 NOTE — ED Notes (Signed)
Ultrasound at bedside

## 2018-07-05 NOTE — ED Triage Notes (Signed)
Pt c/o RLQ pain that radiates to right groin and right leg swollen fro several days. Denies urinary problems or bowel problems except going more frequently.

## 2018-07-05 NOTE — Discharge Instructions (Signed)
Please return for any problem.  Follow-up with your regular care provider as instructed. °

## 2018-07-05 NOTE — ED Notes (Signed)
Patient aware we need urine sample. Urinal at bedside. Patient will call out when urine sample is provided.

## 2018-07-10 ENCOUNTER — Encounter (HOSPITAL_COMMUNITY): Payer: Self-pay

## 2018-07-10 ENCOUNTER — Emergency Department (HOSPITAL_COMMUNITY)
Admission: EM | Admit: 2018-07-10 | Discharge: 2018-07-10 | Disposition: A | Discharge status: Home / Self Care | Payer: Medicaid Other | Attending: Emergency Medicine | Admitting: Emergency Medicine

## 2018-07-10 DIAGNOSIS — R1030 Lower abdominal pain, unspecified: Secondary | ICD-10-CM | POA: Insufficient documentation

## 2018-07-10 DIAGNOSIS — Z79899 Other long term (current) drug therapy: Secondary | ICD-10-CM | POA: Diagnosis not present

## 2018-07-10 DIAGNOSIS — M545 Low back pain, unspecified: Secondary | ICD-10-CM

## 2018-07-10 DIAGNOSIS — I10 Essential (primary) hypertension: Secondary | ICD-10-CM | POA: Diagnosis not present

## 2018-07-10 LAB — CBC WITH DIFFERENTIAL/PLATELET
Abs Immature Granulocytes: 0.01 10*3/uL (ref 0.00–0.07)
BASOS PCT: 0 %
Basophils Absolute: 0 10*3/uL (ref 0.0–0.1)
Eosinophils Absolute: 0.1 10*3/uL (ref 0.0–0.5)
Eosinophils Relative: 1 %
HCT: 39.9 % (ref 39.0–52.0)
Hemoglobin: 12.8 g/dL — ABNORMAL LOW (ref 13.0–17.0)
Immature Granulocytes: 0 %
Lymphocytes Relative: 38 %
Lymphs Abs: 1.8 10*3/uL (ref 0.7–4.0)
MCH: 29.4 pg (ref 26.0–34.0)
MCHC: 32.1 g/dL (ref 30.0–36.0)
MCV: 91.7 fL (ref 80.0–100.0)
Monocytes Absolute: 0.5 10*3/uL (ref 0.1–1.0)
Monocytes Relative: 9 %
Neutro Abs: 2.5 10*3/uL (ref 1.7–7.7)
Neutrophils Relative %: 52 %
Platelets: 272 10*3/uL (ref 150–400)
RBC: 4.35 MIL/uL (ref 4.22–5.81)
RDW: 13.6 % (ref 11.5–15.5)
WBC: 4.8 10*3/uL (ref 4.0–10.5)
nRBC: 0 % (ref 0.0–0.2)

## 2018-07-10 LAB — COMPREHENSIVE METABOLIC PANEL
ALK PHOS: 56 U/L (ref 38–126)
ALT: 17 U/L (ref 0–44)
AST: 24 U/L (ref 15–41)
Albumin: 3.2 g/dL — ABNORMAL LOW (ref 3.5–5.0)
Anion gap: 8 (ref 5–15)
BUN: 12 mg/dL (ref 6–20)
CALCIUM: 8.9 mg/dL (ref 8.9–10.3)
CO2: 24 mmol/L (ref 22–32)
Chloride: 109 mmol/L (ref 98–111)
Creatinine, Ser: 1.31 mg/dL — ABNORMAL HIGH (ref 0.61–1.24)
GFR calc Af Amer: >60 mL/min (ref >60)
GFR calc non Af Amer: >60 mL/min (ref >60)
Glucose, Bld: 96 mg/dL (ref 70–99)
Potassium: 3.4 mmol/L — ABNORMAL LOW (ref 3.5–5.1)
Sodium: 141 mmol/L (ref 135–145)
TOTAL PROTEIN: 7.6 g/dL (ref 6.5–8.1)
Total Bilirubin: 0.6 mg/dL (ref 0.3–1.2)

## 2018-07-10 LAB — URINALYSIS, ROUTINE W REFLEX MICROSCOPIC
Bilirubin Urine: NEGATIVE
Glucose, UA: NEGATIVE mg/dL
HGB URINE DIPSTICK: NEGATIVE
Ketones, ur: NEGATIVE mg/dL
Leukocytes, UA: NEGATIVE
Nitrite: NEGATIVE
Protein, ur: NEGATIVE mg/dL
Specific Gravity, Urine: 1.023 (ref 1.005–1.030)
pH: 6 (ref 5.0–8.0)

## 2018-07-10 LAB — LIPASE, BLOOD: Lipase: 22 U/L (ref 11–51)

## 2018-07-10 MED ORDER — KETOROLAC TROMETHAMINE 30 MG/ML IJ SOLN
30.0000 mg | Freq: Once | INTRAMUSCULAR | Status: AC
  Administered 2018-07-10: 30 mg via INTRAVENOUS
  Filled 2018-07-10: qty 1

## 2018-07-10 MED ORDER — IBUPROFEN 600 MG PO TABS: 600.0000 mg | ORAL_TABLET | Freq: Four times a day (QID) | ORAL | 0 refills | Status: AC | PRN

## 2018-07-10 NOTE — ED Triage Notes (Signed)
Pt presents for evaluation of lower back pain and R groin pain x 1 week. Was seen at [facility] for same on 1/6.

## 2018-07-10 NOTE — ED Provider Notes (Signed)
[place] EMERGENCY DEPARTMENT Provider Note   CSN: [id number] Arrival date & time: 07/10/18  1018     History   Chief Complaint Chief Complaint  Patient presents with  . Back Pain  . Groin Pain    HPI Jonathan Meadows is a 48 y.o. male.  He is a history of schizophrenia and homelessness and also lymphedema of his right leg.  He is complaining of low back pain and lower abdominal pain that started last night.  He told triage it was going on about a week.  Not associate with nausea or vomiting and is been having normal bowel movements.  Patient is unsure if he is ever had this before.  Denies any trauma no fevers or chills.  No urinary symptoms.  The history is provided by the patient.  Back Pain  Location:  Sacro-iliac joint Quality:  Aching Radiates to:  Does not radiate Pain severity:  Moderate Pain is:  Unable to specify Onset quality:  Gradual Duration:  1 day Timing:  Constant Progression:  Unchanged Chronicity:  New Relieved by:  None tried Worsened by:  Nothing Ineffective treatments:  None tried Associated symptoms: abdominal pain and leg pain (chronic right)   Associated symptoms: no chest pain, no dysuria, no fever and no headaches     Past Medical History:  Diagnosis Date  . Anemia   . Cellulitis   . Homelessness   . Lymphedema of leg   . Paranoid schizophrenia ([place])    pt denies  . Septic joint of left shoulder region [facility]) 10/27/2017    Patient Active Problem List   Diagnosis Date Noted  . History of schizophrenia   . Septic joint of left shoulder region ([place]) 10/27/2017  . Adhesive capsulitis of left shoulder   . Effusion of left shoulder joint 10/10/2017  . Chest pain 10/08/2017  . GAD (generalized anxiety disorder) 06/07/2017  . Financial difficulties 06/07/2017  . Lymphedema 06/07/2017  . Gastric outlet obstruction 10/31/2016  . Chronic pain associated with significant psychosocial dysfunction 06/04/2016  . Schizophrenia,  chronic condition with acute exacerbation ([place]) 09/11/2015  . Hypertension 12/03/2014  . Polysubstance abuse ([place]) 12/03/2014  . Schizoaffective disorder, bipolar type ([place]) 12/03/2014  . Back pain 08/11/2013  . Swollen leg 10/10/2012  . Anemia 10/10/2012    Past Surgical History:  Procedure Laterality Date  . SHOULDER ARTHROSCOPY Left 10/11/2017   Procedure: ARTHROSCOPY SHOULDER, LEFT;  Surgeon: Newt Minion, MD;  Location: [place];  Service: Orthopedics;  Laterality: Left;  . SHOULDER ARTHROSCOPY WITH SUBACROMIAL DECOMPRESSION, ROTATOR CUFF REPAIR AND BICEP TENDON REPAIR Left 11/24/2017   Procedure: LEFT SHOULDER ARTHROSCOPY, OPEN DISTAL CLAVICLE EXCISION, EXTENSIVE INTRA-ARTICULAR AND EXTRA-ARTICULAR DEBRIDEMENT;  Surgeon: [name], MD;  Location: [place];  Service: Orthopedics;  Laterality: Left;        Home Medications    Prior to Admission medications   Medication Sig Start Date End Date Taking? Authorizing Provider  B Complex-C (SUPER B COMPLEX PO) Take 1 tablet by mouth daily.    [provider]  GINSENG PO Take 1 tablet by mouth daily.    [provider]  HYDROcodone-acetaminophen (NORCO/VICODIN) 5-325 MG tablet Take 2 tablets by mouth every 4 (four) hours as needed. 07/05/18   Valarie Merino, MD  ibuprofen (ADVIL,MOTRIN) 600 MG tablet Take 1 tablet (600 mg total) by mouth every 6 (six) hours as needed. Patient taking differently: Take 600 mg by mouth every 6 (six) hours as needed for fever  or headache.  05/16/18   McDonald, Mia A, PA-C  omega-3 acid ethyl esters (LOVAZA) 1 g capsule Take 1 g by mouth daily.    [provider]  ondansetron (ZOFRAN ODT) 4 MG disintegrating tablet Take 1 tablet (4 mg total) by mouth every 8 (eight) hours as needed for nausea or vomiting. 06/20/18   Duffy Bruce, MD    Family History Family History  Problem Relation Age of Onset  . CAD Other        Neg HX  . Hypertension Other        Neg Hx  .  Diabetes Other        Neg Hx  . Cancer Other        Neg Hx  . Lupus Mother     Social History Social History   Tobacco Use  . Smoking status: Never Smoker  . Smokeless tobacco: Never Used  Substance Use Topics  . Alcohol use: No  . Drug use: No     Allergies   Pork-derived products   Review of Systems Review of Systems  Constitutional: Negative for fever.  HENT: Negative for sore throat.   Eyes: Negative for visual disturbance.  Respiratory: Negative for shortness of breath.   Cardiovascular: Positive for leg swelling. Negative for chest pain.  Gastrointestinal: Positive for abdominal pain. Negative for constipation, diarrhea, nausea and vomiting.  Genitourinary: Negative for dysuria.  Musculoskeletal: Positive for back pain. Negative for neck pain.  Skin: Negative for rash.  Neurological: Negative for headaches.     Physical Exam Updated Vital Signs BP (!) 150/103 (BP Location: Right Arm)   Pulse 94   Temp 97.8 F (36.6 C) (Oral)   Resp 16   Ht 6' (1.829 m)   Wt 108.9 kg   SpO2 99%   BMI 32.55 kg/m   Physical Exam Vitals signs and nursing note reviewed.  Constitutional:      Appearance: He is well-developed.  HENT:     Head: Normocephalic and atraumatic.  Eyes:     Conjunctiva/sclera: Conjunctivae normal.  Neck:     Musculoskeletal: Neck supple.  Cardiovascular:     Rate and Rhythm: Normal rate and regular rhythm.     Heart sounds: No murmur.  Pulmonary:     Effort: Pulmonary effort is normal. No respiratory distress.     Breath sounds: Normal breath sounds.  Abdominal:     Palpations: Abdomen is soft. There is no mass.     Tenderness: There is no abdominal tenderness. There is no guarding.  Musculoskeletal:        General: Swelling present.     Right lower leg: Edema present.  Skin:    General: Skin is warm and dry.     Capillary Refill: Capillary refill takes less than 2 seconds.  Neurological:     General: No focal deficit present.      Mental Status: He is alert.     Gait: Gait normal.      ED Treatments / Results  Labs (all labs ordered are listed, but only abnormal results are displayed) Labs Reviewed  COMPREHENSIVE METABOLIC PANEL - Abnormal; Notable for the following components:      Result Value   Potassium 3.4 (*)    Creatinine, Ser 1.31 (*)    Albumin 3.2 (*)    All other components within normal limits  CBC WITH DIFFERENTIAL/PLATELET - Abnormal; Notable for the following components:   Hemoglobin 12.8 (*)    All other components  within normal limits  LIPASE, BLOOD  URINALYSIS, ROUTINE W REFLEX MICROSCOPIC    EKG None  Radiology No results found.  Procedures Procedures (including critical care time)  Medications Ordered in ED Medications  ketorolac (TORADOL) 30 MG/ML injection 30 mg (has no administration in time range)     Initial Impression / Assessment and Plan / ED Course  I have reviewed the triage vital signs and the nursing notes.  Pertinent labs & imaging results that were available during my care of the patient were reviewed by me and considered in my medical decision making (see chart for details).  Clinical Course as of Jul 11 1215  Sat Jan 11, 422  9531 48 year old male with history of schizophrenia and homelessness here with complaints of low back pain and lower abdominal pain.  Looks like he comes to the ER once every week or 2 with some complaints.  He had a CT abdomen less than a month ago.  His blood pressure is a little elevated here but no fever.  He has a benign exam.  Will check some screening labs.   [MB]  1130 Been informed by the nurses that the patient is standing in the room masturbating and ejaculated on the floor.  Please have been by to talk to him.  Still waiting on the rest of his lab work.  Anticipate will discharge if no abnormalities found.   [MB]  1700 Patient continues to masturbate in room and be otherwise disruptive.  The police have removed him from the  premises.  His lab work was still pending at time of removal.   [MB]    Clinical Course User Index [MB] Hayden Rasmussen, MD     Final Clinical Impressions(s) / ED Diagnoses   Final diagnoses:  Acute bilateral low back pain without sciatica  Lower abdominal pain    ED Discharge Orders         Ordered    ibuprofen (ADVIL,MOTRIN) 600 MG tablet  Every 6 hours PRN     07/10/18 1148           Hayden Rasmussen, MD 07/10/18 1218

## 2018-07-10 NOTE — ED Notes (Signed)
[employer] and [facility] [employer] at bedside related to patient continued masturbation in front of staff, other patients and visitors. Patient unwilling to stop but willingly left ED. Security escorted patient off premises. MD aware.

## 2018-07-10 NOTE — Discharge Instructions (Signed)
You were evaluated in the emergency department for low back pain and lower abdominal pain.  You had an unremarkable exam here and your lab work did not show any obvious abnormalities.  It will be important for you to follow-up with your primary care doctor.  We are prescribing you some ibuprofen to take for pain as needed.

## 2018-07-10 NOTE — ED Notes (Signed)
Pt.has been playing with himself while he was been triaged.and while door open and with. Lights off.standing in front the t.v. he is jacking off.while lights off.sperm on the floor.wwatching [place] at the desk with blanket over shoulders .we call security

## 2018-07-13 ENCOUNTER — Encounter: Payer: Self-pay | Admitting: Vascular Surgery

## 2018-07-13 ENCOUNTER — Encounter (HOSPITAL_COMMUNITY): Payer: Self-pay

## 2018-07-15 ENCOUNTER — Ambulatory Visit (INDEPENDENT_AMBULATORY_CARE_PROVIDER_SITE_OTHER): Payer: Self-pay | Admitting: Family Medicine

## 2019-05-08 IMAGING — CT CT ANGIO CHEST-ABD-PELV FOR DISSECTION W/ AND WO/W CM
2 of 7 series · 11 of 46 positions shown, 12 images · IV contrast (OMNI 350)
Comparison: Chest radiograph October 08, 2017; CT abdomen and pelvis
July 18, 2017

CLINICAL DATA: Chest and abdominal pain

EXAM:
CT ANGIOGRAPHY CHEST, ABDOMEN AND PELVIS
TECHNIQUE: Initially, axial CT images were obtained through the chest without
intravenous contrast material administration. Multidetector CT
imaging through the chest, abdomen and pelvis was performed using
the standard protocol during bolus administration of intravenous
contrast. Multiplanar reconstructed images and MIPs were obtained
and reviewed to evaluate the vascular anatomy.
CONTRAST:  100mL SBCTKK-CNC IOPAMIDOL (SBCTKK-CNC) INJECTION 76%

[Series 7: dissection 2mm · axial · 0.75mm/px · z∈[+738,+1284]mm · 8 of 347 slices shown, 9 images]
[im 37/347  soft-tissue]
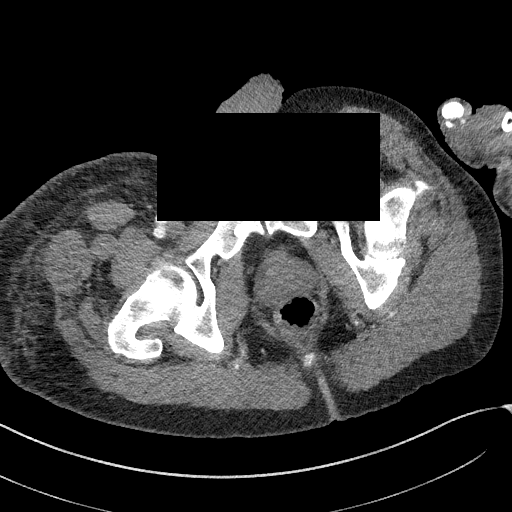
[im 37/347  bone]
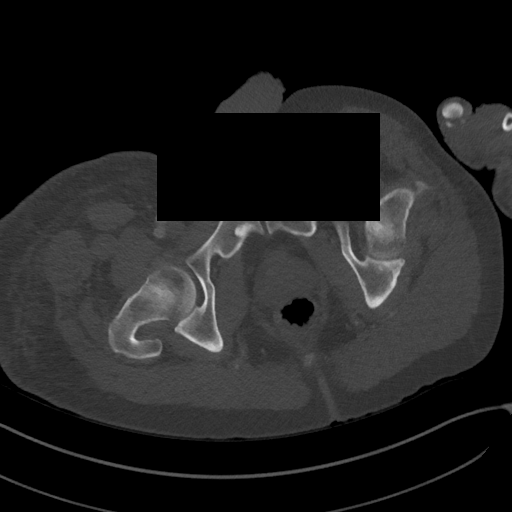
[im 73/347  soft-tissue]
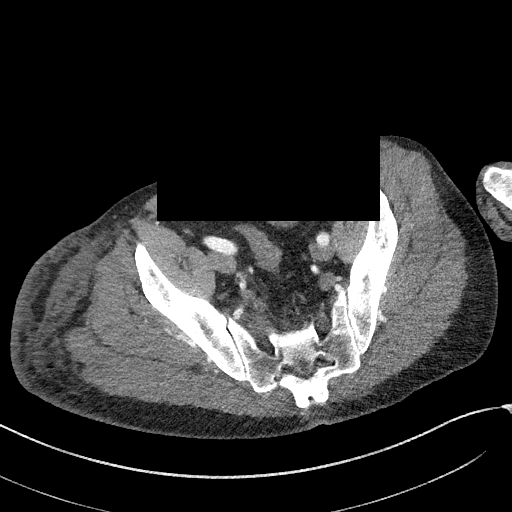
[im 110/347  soft-tissue]
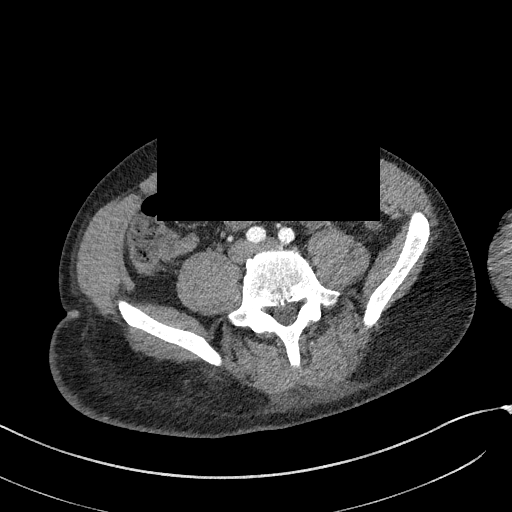
[im 146/347  soft-tissue]
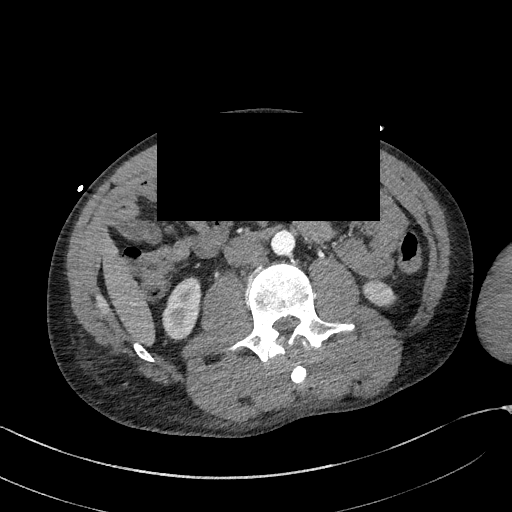
[im 201/347  soft-tissue]
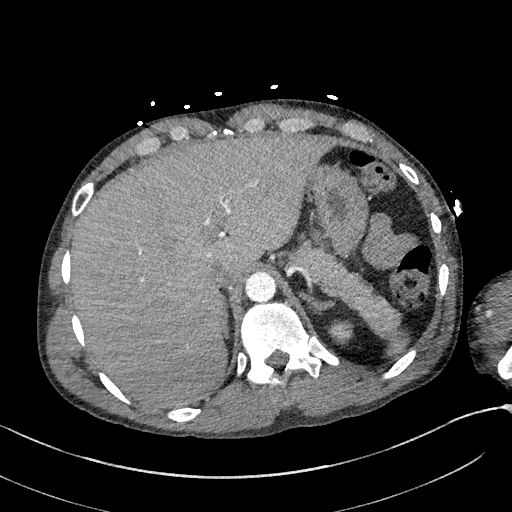
[im 237/347  soft-tissue]
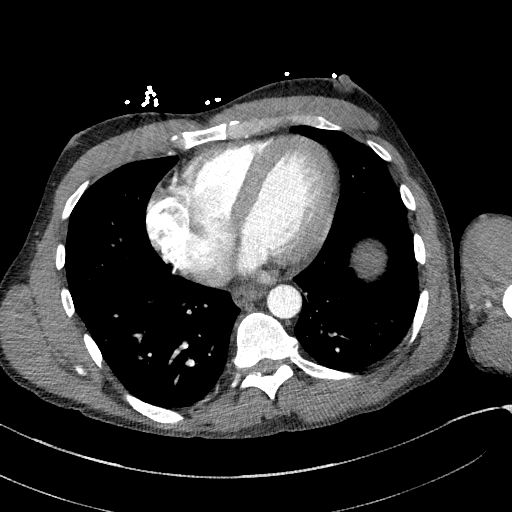
[im 274/347  soft-tissue]
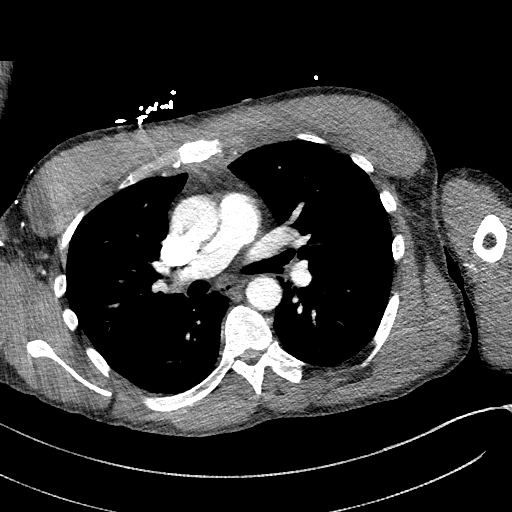
[im 310/347  soft-tissue]
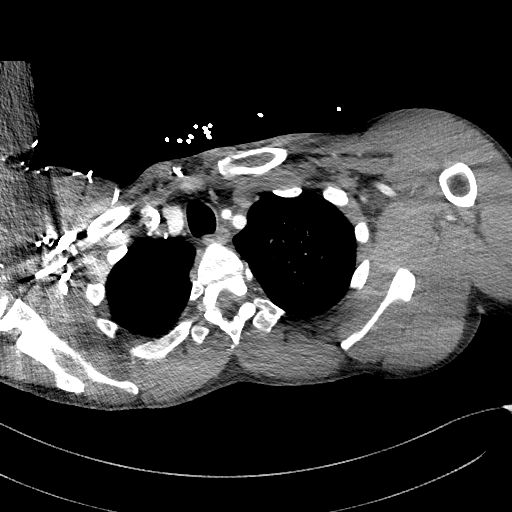

[Series 10: dissection 2mm cor · coronal · 0.74mm/px · 3 of 129 slices shown]
[im 33/129  soft-tissue]
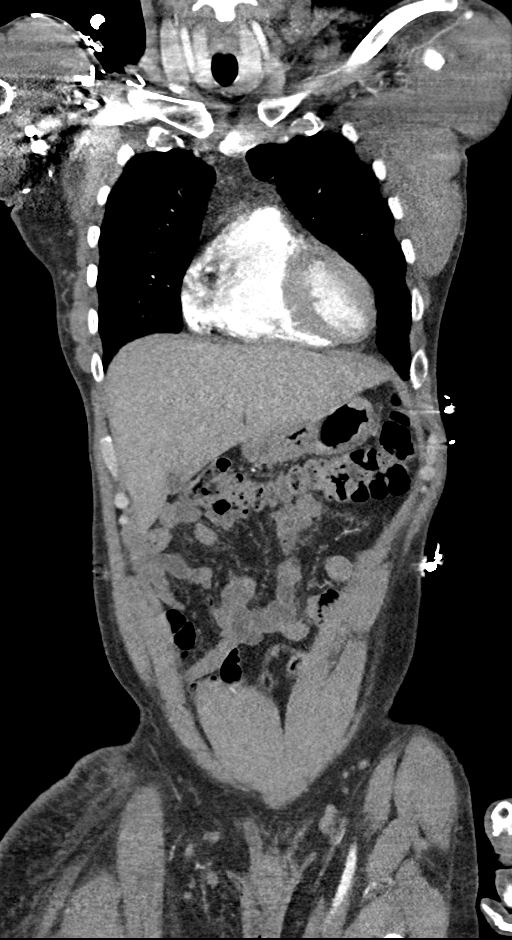
[im 65/129  soft-tissue]
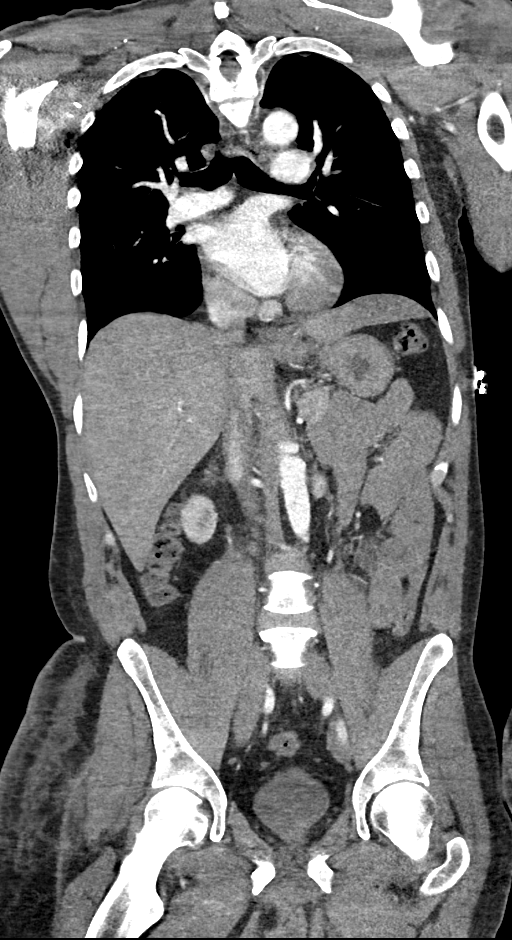
[im 97/129  soft-tissue]
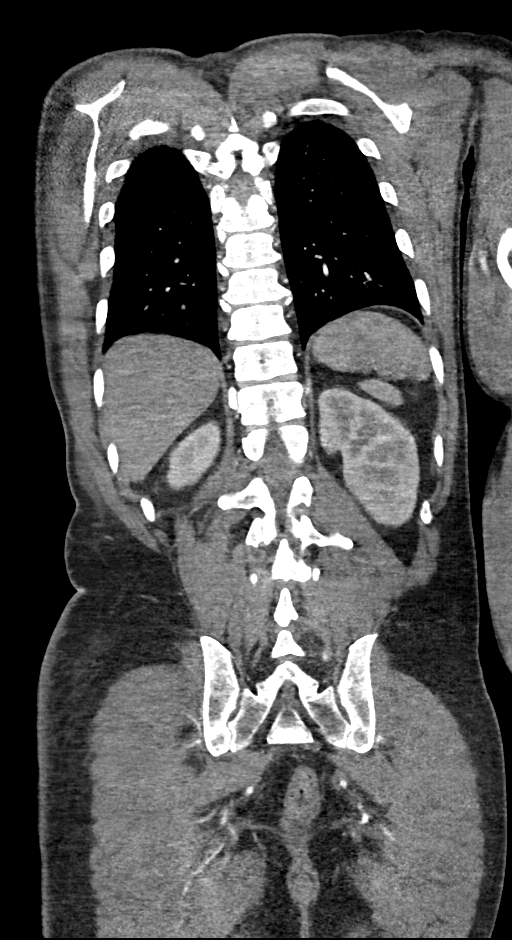

[11 of 46 positions shown; findings below may reference images not displayed]

FINDINGS: CTA CHEST FINDINGS

Cardiovascular: No intramural hematoma is evident on noncontrast
enhanced study. There is no appreciable thoracic aortic aneurysm or
dissection. The visualized great vessels appear unremarkable. No
aneurysm or dissection in these vessels. No evident pulmonary
embolus. There is no appreciable pericardial effusion or pericardial
thickening.

Mediastinum/Nodes: Thyroid appears normal. There is no appreciable
thoracic adenopathy. No evident esophageal lesions.

Lungs/Pleura: There are small bullae in the extreme lung apices and
medial aspect of the anterior segment of the left upper lobe. There
is mild bibasilar atelectasis. No evident edema or consolidation. No
pleural effusion or pleural thickening evident.

Musculoskeletal: No blastic or lytic bone lesions. No chest wall
lesions are evident.

Review of the MIP images confirms the above findings.

CTA ABDOMEN AND PELVIS FINDINGS

VASCULAR

Aorta: There is no abdominal aortic aneurysm or dissection. There is
no appreciable atherosclerotic change in the abdominal aorta.

Celiac: Celiac artery and its major branches appear widely patent.
No aneurysm or dissection. No appreciable atherosclerotic change.

SMA: Superior mesenteric artery and its major branches appear widely
patent. No appreciable atherosclerosis. No aneurysm or dissection
evident.

Renals: There is a single renal artery on each side. A small
accessory renal artery arises off the proximal aspect of each main
renal artery. There is no aneurysm or dissection in either renal
artery or branch. No appreciable atherosclerosis. No fibromuscular
dysplasia. No evident vasculitis.

IMA: Inferior mesenteric artery and its branches are widely patent
without appreciable atherosclerosis. No aneurysm or dissection.

Inflow: Pelvic arterial vessels appear widely patent. No aneurysm or
dissection. No appreciable atherosclerotic irregularity. Proximal
superficial and profunda femoral arteries also are widely patent.

Veins: Previous CT showed narrowing of the left common femoral vein,
a finding that appears chronic. There is chronic edema in the right
thigh which appears stable and may be secondary to chronic venous
stasis phenomenon on the right. Elsewhere, visualized venous
structures appear grossly normal. Note that this study was performed
to optimize arterial phase evaluation is opposed to venous phase
evaluation.

Review of the MIP images confirms the above findings.

NON-VASCULAR

Hepatobiliary: No focal liver lesions are evident. Gallbladder wall
is not appreciably thickened. There is no biliary duct dilatation.

Pancreas: No pancreatic mass or inflammatory focus.

Spleen: No splenic lesions are evident. There is a tiny accessory
spleen superior to the spleen.

Adrenals/Urinary Tract: Adrenals bilaterally appear normal. Kidneys
bilaterally show no evident hydronephrosis. There is a stable cyst
in the lateral aspect of the mid right kidney measuring 9 x 7 mm.
There is no evident renal or ureteral calculus on either side.
Urinary bladder is midline with wall thickness within normal limits.

Stomach/Bowel: There is no evident bowel wall or mesenteric
thickening. No evident bowel obstruction. No free air or portal
venous air.

Lymphatic: There are prominent right inguinal region lymph nodes,
largest measuring 2.1 x 1.9 cm. This lymph node prominence was
present previously and may be secondary to the venous stasis
phenomenon with edema in the right thigh region. Apparent apart from
enlarged right inguinal and upper thigh region lymph nodes, there is
no appreciable adenopathy in the abdomen or pelvis. There are a few
subcentimeter mesenteric lymph nodes, regarded as nonspecific.

Reproductive: Prostate and seminal vesicles are normal in size and
contour. There are a few tiny prostatic calculi. No pelvic mass
evident.

Other: No abscess or ascites is evident in the abdomen or pelvis.
Appendix appears normal.

Musculoskeletal: No evident blastic or lytic bone lesions. No
intramuscular lesions are evident.

Review of the MIP images confirms the above findings.
IMPRESSION: CT angiogram chest:

1. No thoracic aortic aneurysm or dissection. Visualized great
vessels appear normal. No pulmonary embolus evident.

2.  No edema or consolidation.  Mild bibasilar atelectasis.

3.  No evident adenopathy.

CT angiogram abdomen; CT angiogram pelvis:

1. Chronic edema and stranding in the right thigh region, likely due
to venous stasis phenomenon. There is noted to be narrowing of the
right common femoral vein, better seen on prior CT examination which
better opacified venous structures. Lymph node prominence in the
right upper thigh and right inguinal regions is likely due to stasis
phenomenon.

2. No arterial aneurysm or dissection in the aorta or major pelvic
and mesenteric arterial vessels. No appreciable atherosclerotic
changes in these vessels.

3. No bowel wall thickening or bowel obstruction. No abscess.
Appendix appears normal.

4. No renal or ureteral calculus. No hydronephrosis. There are a few
small prostatic calculi.

## 2019-05-09 IMAGING — MR MR SHOULDER*L* W/O CM
4 series · 36 of 40 positions shown · non-contrast
Comparison: 10/08/2017 radiographs

CLINICAL DATA: Acute persistent left shoulder pain.

EXAM:
MRI OF THE LEFT SHOULDER WITHOUT CONTRAST
TECHNIQUE: Multiplanar, multisequence MR imaging of the shoulder was performed.
No intravenous contrast was administered.

[Series 4: T2 fat-sat · axial · 3.0mm · 0.31mm/px · z∈[+21,+112]mm · 11 of 27 slices shown (1 of 3)]
[im 1/27]
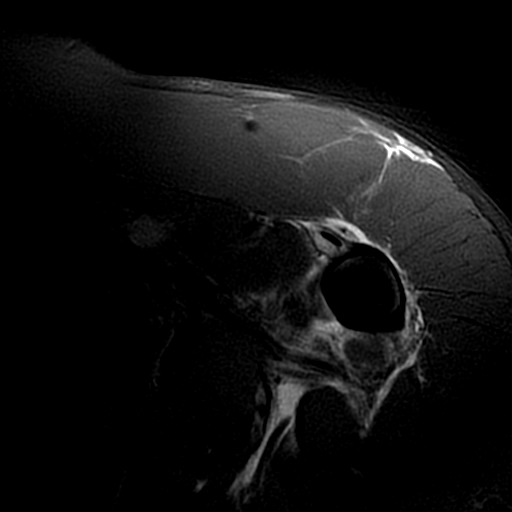
[im 3/27]
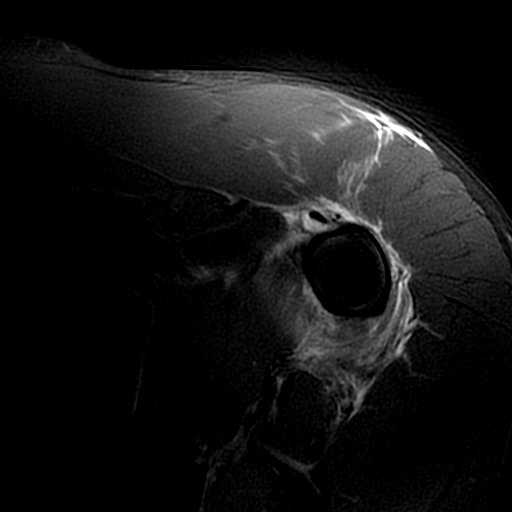
[im 6/27]
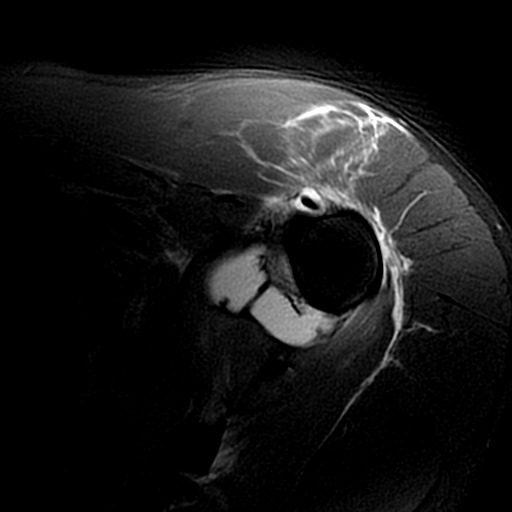
[im 8/27]
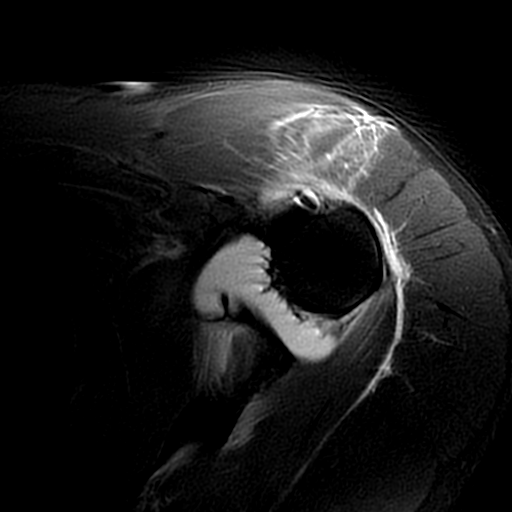
[im 11/27]
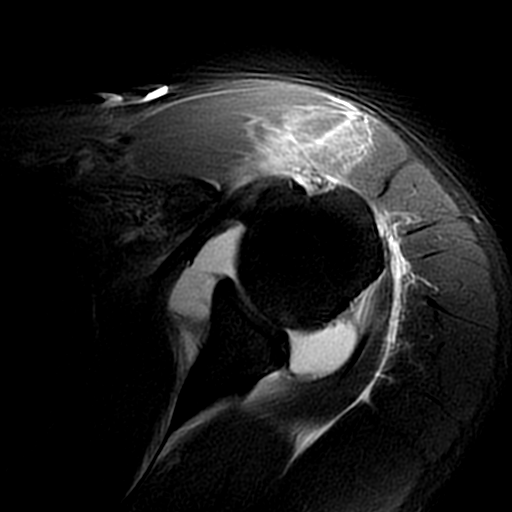
[im 14/27]
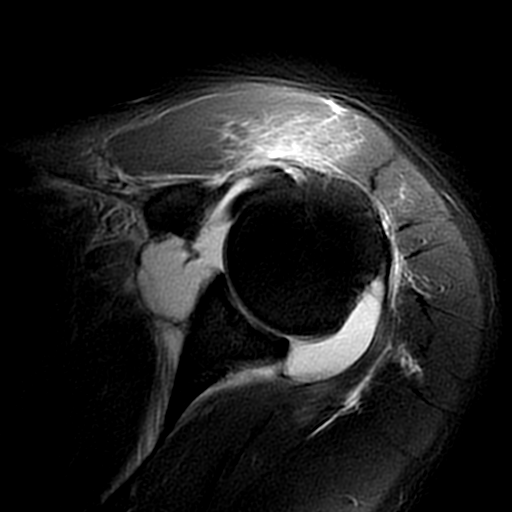
[im 16/27]
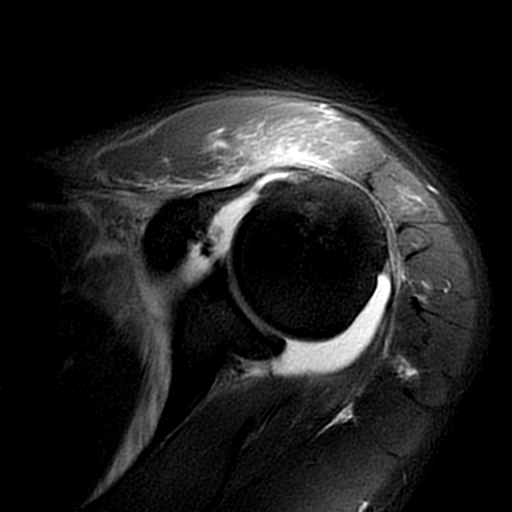
[im 19/27]
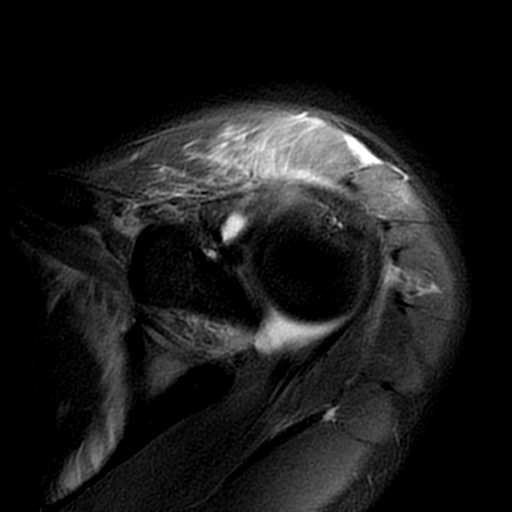
[im 21/27]
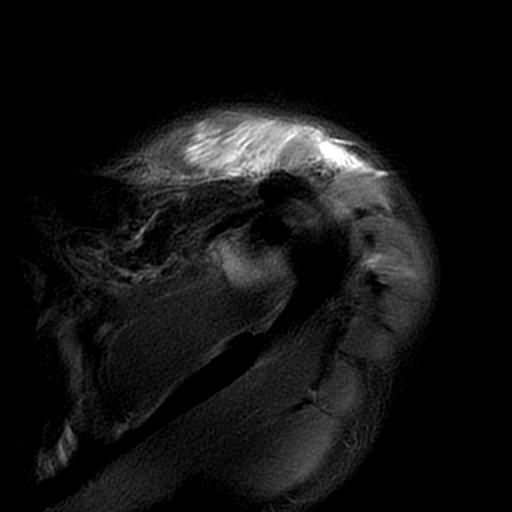
[im 24/27]
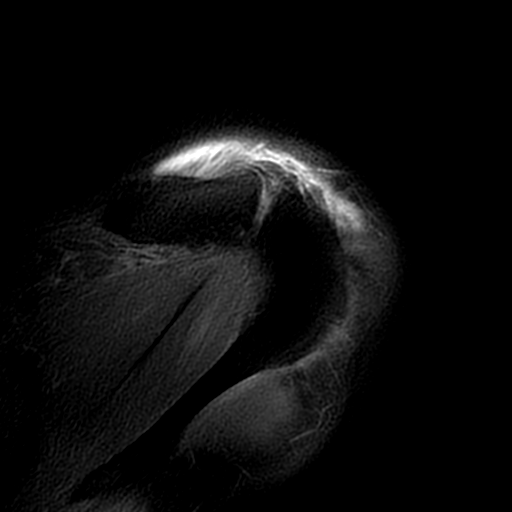
[im 27/27]
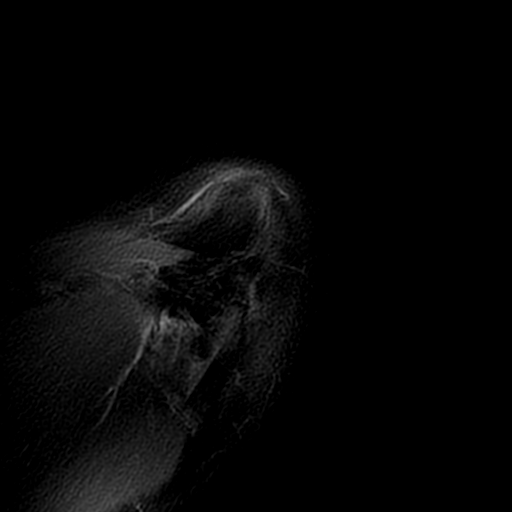

[Series 5: T2 fat-sat · oblique · 3.0mm · 0.62mm/px · 11 of 24 slices shown (2 of 3)]
[im 1/24]
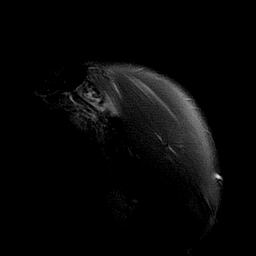
[im 3/24]
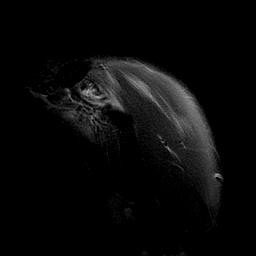
[im 5/24]
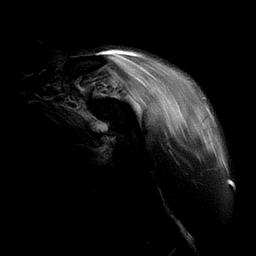
[im 7/24]
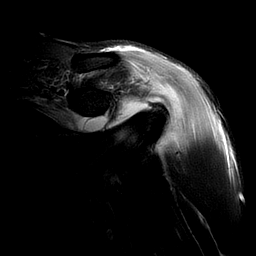
[im 10/24]
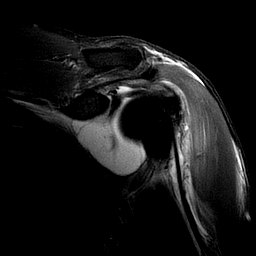
[im 12/24]
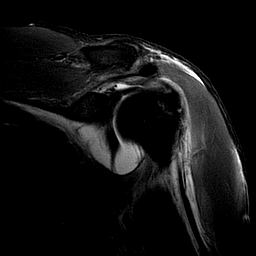
[im 14/24]
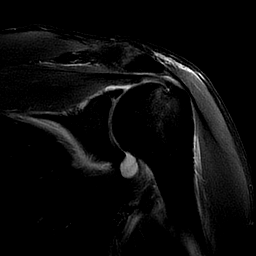
[im 17/24]
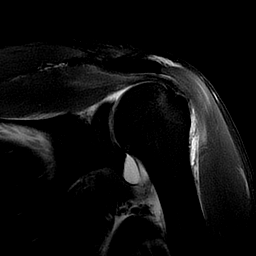
[im 19/24]
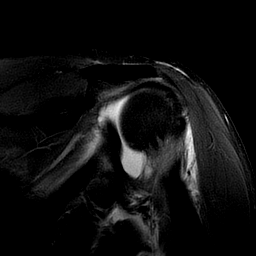
[im 21/24]
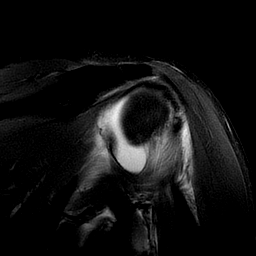
[im 24/24]
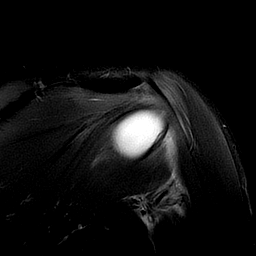

[Series 6: PD · oblique · 3.0mm · 0.62mm/px · 11 of 24 slices shown]
[im 1/24]
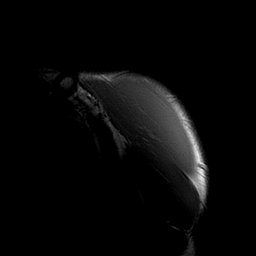
[im 3/24]
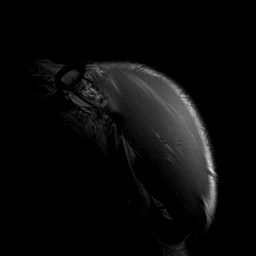
[im 5/24]
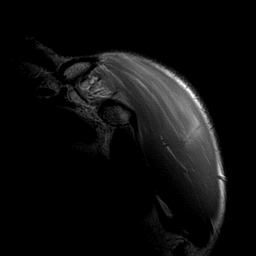
[im 7/24]
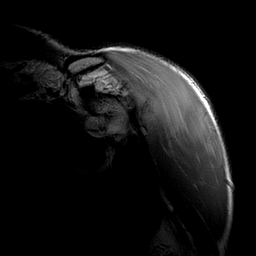
[im 10/24]
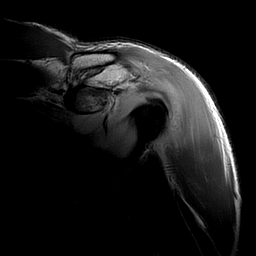
[im 12/24]
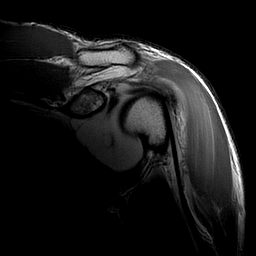
[im 14/24]
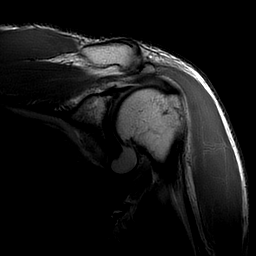
[im 17/24]
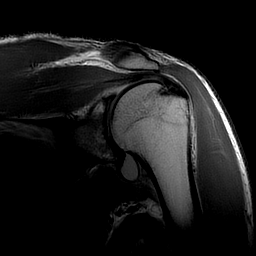
[im 19/24]
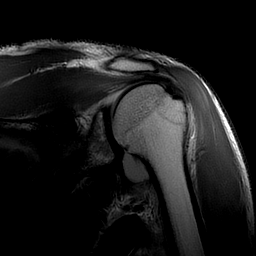
[im 21/24]
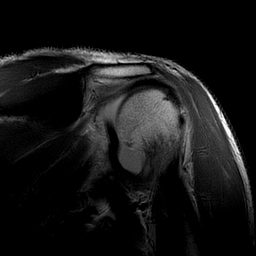
[im 24/24]
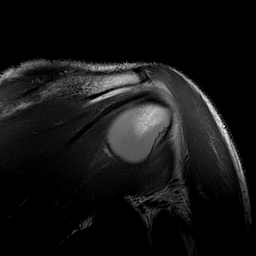

[Series 7: T2 fat-sat · oblique · 3.0mm · 0.62mm/px · 3 of 15 slices shown (3 of 3)]
[im 3/15]
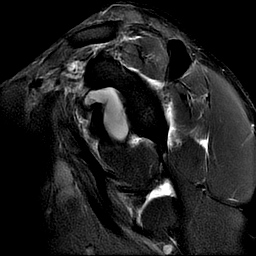
[im 8/15]
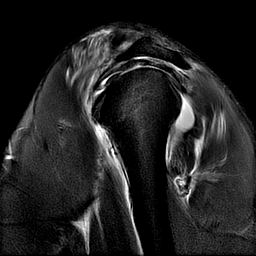
[im 12/15]
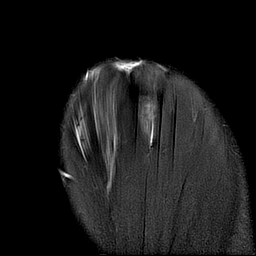

[36 of 40 positions shown; findings below may reference images not displayed]

FINDINGS: Patient could not remain still due to shoulder pain and could not
finish the exam and refused further imaging per technologist report.
This limits assessment. Axial, coronal, sagittal T2 propeller as
well as coronal proton density images were acquired of the left
shoulder.

Rotator cuff:  No rotator cuff tear.

Muscles: Intramuscular edema noted of the deltoid possibly
iatrogenic from IM injection, post traumatic contusion or from
myositis. Lesser degree of interstitial edema is seen about the
subscapularis, infraspinatus and teres minor muscles.

Biceps long head:  Intact

Acromioclavicular Joint: Normal acromioclavicular joint. No marrow
edema. Type II acromion. Trace subacromial bursal edema.

Glenohumeral Joint: Distended glenohumeral joint with fluid. No
synovial thickening.

Labrum:  No labral tear.  Intact biceps labral complex.

Bones: Nonspecific subtle marrow edema of the humeral head and neck.
Subcortical degenerative change deep to the supraspinatus insertion.

Other: Chondral defects.
IMPRESSION: 1. Incomplete shoulder MRI exam. Patient could not finish the study
due to shoulder pain.
2. No rotator cuff tear. Trace subacromial bursal fluid compatible
with bursitis.
3. Intramuscular edema predominantly involving the anterior deltoid
muscle with moderate to large glenohumeral joint effusion.
Intramuscular edema could be iatrogenic from intramuscular or
intra-articular injection, posttraumatic in etiology or possibly
from myositis. No frank bone destruction however there is some
reactive edema of the humeral head and neck associated with some
subcortical presumed degenerative change near the footplate of the
supraspinatus tendon. No definite evidence of septic arthritis. An
inflammatory arthropathy or synovitis might account for the joint
effusion in addition to posttraumatic etiology. Infected joint
effusion less likely.
4. No acute labral or rotator cuff tear.

## 2019-09-28 NOTE — Telephone Encounter (Signed)
disregard

## 2020-12-22 ENCOUNTER — Other Ambulatory Visit: Payer: Self-pay

## 2020-12-22 ENCOUNTER — Emergency Department (HOSPITAL_BASED_OUTPATIENT_CLINIC_OR_DEPARTMENT_OTHER): Payer: Medicaid Other

## 2020-12-22 ENCOUNTER — Emergency Department (HOSPITAL_BASED_OUTPATIENT_CLINIC_OR_DEPARTMENT_OTHER)
Admission: EM | Admit: 2020-12-22 | Discharge: 2020-12-22 | Disposition: A | Discharge status: Home / Self Care | Payer: Medicaid Other | Attending: Emergency Medicine | Admitting: Emergency Medicine

## 2020-12-22 DIAGNOSIS — R609 Edema, unspecified: Secondary | ICD-10-CM | POA: Insufficient documentation

## 2020-12-22 DIAGNOSIS — M79604 Pain in right leg: Secondary | ICD-10-CM

## 2020-12-22 MED ORDER — DOXYCYCLINE HYCLATE 100 MG PO CAPS: 100.0000 mg | ORAL_CAPSULE | Freq: Two times a day (BID) | ORAL | 0 refills | Status: AC

## 2020-12-22 NOTE — Discharge Instructions (Addendum)
Your DVT study here in the ED was negative for blood clot.  Suspect that your right lower extremity swelling is consistent with your known lymphedema.  However, given your reported increased pain/redness/tenderness, will cover for skin and soft tissue infection.  Please take the doxycycline, as directed.  I have also printed off a prescription for compression stockings.  Please use as directed by vascular.  You will need to follow-up with the lymphedema clinic for ongoing management.  Return to the ER seek immediate medical attention should you experience any new or worsening symptoms.

## 2020-12-22 NOTE — ED Triage Notes (Signed)
Pt c/o right leg pain for a few days. States there is a "lesion" on it. Hx of lymphedema. States leg is slightly more swollen than normal.

## 2020-12-22 NOTE — Progress Notes (Signed)
Lower extremity venous RT study completed. Patient touching genital area under sheets for duration of examination. [place], PA made aware of suspected inappropriate behavior.   Darlin Coco, RDMS, RVT

## 2020-12-22 NOTE — ED Notes (Signed)
Provided pt gown and warm blankets

## 2020-12-22 NOTE — ED Provider Notes (Signed)
[facility] Provider Note   CSN: [id number] Arrival date & time: 12/22/20  1420     History Chief Complaint  Patient presents with   Leg Pain    Jonathan Meadows is a 50 y.o. male with past medical history significant for lymphedema followed by Vascular Surgery at [facility] who presents the ED with complaints of right leg pain.  I reviewed patient's medical record and he was seen by vascular surgery on 12/10/2020.  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  Work-up and exam was reassuring.  He had been given 30 to 40 mmHg thigh-high compression hose for right lower extremity.  He is supposed be wearing them at all times that he is awake and active.  He was also referred to the lymphedema clinic for possible lymphedema pumps as well as ongoing evaluation and management.  He can return to the vascular clinic as needed.  On my examination, patient reports that he did not get the new compression stockings that had been ordered by vascular surgery.  He is still using his old stockings that are falling apart and inadequate.  He reports that over the course of the past few days he has been experiencing worsening swelling in his right lower extremity beyond his typical baseline.  He states that it is also red, hot, and painful to the touch.    He denies any inability to ambulate, fevers or chills, numbness or weakness, injury or trauma, or any other symptoms.  HPI     Past Medical History:  Diagnosis Date   Anemia    Cellulitis    Homelessness    Lymphedema of leg    Paranoid schizophrenia ([place])    pt denies   Septic joint of left shoulder region ([place]) 10/27/2017    Patient Active Problem List   Diagnosis Date Noted   History of schizophrenia    Septic joint of left shoulder region ([place]) 10/27/2017   Adhesive capsulitis of left shoulder    Effusion of left shoulder joint 10/10/2017    Chest pain 10/08/2017   GAD (generalized anxiety disorder) 06/07/2017   Financial difficulties 06/07/2017   Lymphedema 06/07/2017   Gastric outlet obstruction 10/31/2016   Chronic pain associated with significant psychosocial dysfunction 06/04/2016   Schizophrenia, chronic condition with acute exacerbation ([place]) 09/11/2015   Hypertension 12/03/2014   Polysubstance abuse ([place]) 12/03/2014   Schizoaffective disorder, bipolar type ([place]) 12/03/2014   Back pain 08/11/2013   Swollen leg 10/10/2012   Anemia 10/10/2012    Past Surgical History:  Procedure Laterality Date   SHOULDER ARTHROSCOPY Left 10/11/2017   Procedure: ARTHROSCOPY SHOULDER, LEFT;  Surgeon: Newt Minion, MD;  Location: [place] [facility];  Service: Orthopedics;  Laterality: Left;   SHOULDER ARTHROSCOPY WITH SUBACROMIAL DECOMPRESSION, ROTATOR CUFF REPAIR AND BICEP TENDON REPAIR Left 11/24/2017   Procedure: LEFT SHOULDER ARTHROSCOPY, OPEN DISTAL CLAVICLE EXCISION, EXTENSIVE INTRA-ARTICULAR AND EXTRA-ARTICULAR DEBRIDEMENT;  Surgeon: [name], MD;  Location: [facility];  Service: Orthopedics;  Laterality: Left;       Family History  Problem Relation Age of Onset   CAD Other        Neg HX   Hypertension Other        Neg Hx   Diabetes Other        Neg Hx   Cancer Other        Neg Hx   Lupus Mother  Social History   Tobacco Use   Smoking status: Never   Smokeless tobacco: Never  Vaping Use   Vaping Use: Never used  Substance Use Topics   Alcohol use: No   Drug use: No    Home Medications Prior to Admission medications   Medication Sig Start Date End Date Taking? Authorizing Provider  doxycycline (VIBRAMYCIN) 100 MG capsule Take 1 capsule (100 mg total) by mouth 2 (two) times daily for 7 days. 12/22/20 12/29/20 Yes Corena Herter, PA-C  B Complex-C (SUPER B COMPLEX PO) Take 1 tablet by mouth daily.    [provider]  GINSENG PO Take 1 tablet by mouth daily.    [provider]   HYDROcodone-acetaminophen (NORCO/VICODIN) 5-325 MG tablet Take 2 tablets by mouth every 4 (four) hours as needed. 07/05/18   Valarie Merino, MD  ibuprofen (ADVIL,MOTRIN) 600 MG tablet Take 1 tablet (600 mg total) by mouth every 6 (six) hours as needed for moderate pain. 07/10/18   Hayden Rasmussen, MD  omega-3 acid ethyl esters (LOVAZA) 1 g capsule Take 1 g by mouth daily.    [provider]  ondansetron (ZOFRAN ODT) 4 MG disintegrating tablet Take 1 tablet (4 mg total) by mouth every 8 (eight) hours as needed for nausea or vomiting. 06/20/18   Duffy Bruce, MD    Allergies    Pork-derived products  Review of Systems   Review of Systems  All other systems reviewed and are negative.  Physical Exam Updated Vital Signs BP 128/67 (BP Location: Right Arm)   Pulse 92   Temp 98.4 F (36.9 C) (Oral)   Resp 18   Ht 6\' 1"  (1.854 m)   Wt 108 kg   SpO2 99%   BMI 31.41 kg/m   Physical Exam Vitals and nursing note reviewed. Exam conducted with a chaperone present.  Constitutional:      General: He is not in acute distress.    Appearance: Normal appearance. He is not ill-appearing or toxic-appearing.  HENT:     Head: Normocephalic and atraumatic.  Eyes:     General: No scleral icterus.    Conjunctiva/sclera: Conjunctivae normal.  Cardiovascular:     Rate and Rhythm: Normal rate.     Pulses: Normal pulses.  Pulmonary:     Effort: Pulmonary effort is normal. No respiratory distress.  Musculoskeletal:        General: Swelling and tenderness present.     Cervical back: Normal range of motion.     Right lower leg: Edema present.     Left lower leg: No edema.     Comments: Significant right lower extremity swelling and edema relative to contralateral leg.  I cannot personally confirm/compare to baseline.  Pedal pulses palpable.  Confirmed with Doppler.  Sensation intact.  Able to flex and dorsiflex ankle without difficulty.  Lower extremity is warm to the touch, erythematous,  and swollen.    Skin:    General: Skin is dry.  Neurological:     Mental Status: He is alert and oriented to person, place, and time.     GCS: GCS eye subscore is 4. GCS verbal subscore is 5. GCS motor subscore is 6.  Psychiatric:        Mood and Affect: Mood normal.        Behavior: Behavior normal.        Thought Content: Thought content normal.      ED Results / Procedures / Treatments   Labs (all  labs ordered are listed, but only abnormal results are displayed) Labs Reviewed - No data to display  EKG None  Radiology US Venous Img Lower Right (DVT Study)  Result Date: 12/22/2020 CLINICAL DATA:  Worsening right lower extremity swelling. EXAM: RIGHT LOWER EXTREMITY VENOUS DOPPLER ULTRASOUND TECHNIQUE: Gray-scale sonography with compression, as well as color and duplex ultrasound, were performed to evaluate the deep venous system(s) from the level of the common femoral vein through the popliteal and proximal calf veins. COMPARISON:  August 01, 2020 FINDINGS: VENOUS Normal compressibility of the RIGHT common femoral, superficial femoral, and popliteal veins, as well as the visualized RIGHT calf veins (limited visualization of the RIGHT peroneal vein is noted). Visualized portions of profunda femoral vein and great saphenous vein unremarkable. No filling defects to suggest DVT on grayscale or color Doppler imaging. Doppler waveforms show normal direction of venous flow, normal respiratory plasticity and response to augmentation. Limited views of the contralateral common femoral vein are unremarkable. OTHER A 1.1 cm x 0.8 cm right groin lymph node is noted. Limitations: none IMPRESSION: Negative. Electronically Signed   By: Virgina Norfolk M.D.   On: 12/22/2020 16:29    Procedures Procedures   Medications Ordered in ED Medications - No data to display  ED Course  I have reviewed the triage vital signs and the nursing notes.  Pertinent labs & imaging results that were available  during my care of the patient were reviewed by me and considered in my medical decision making (see chart for details).    MDM Rules/Calculators/A&P                          Jonathan Meadows was evaluated in Emergency Department on 12/22/2020 for the symptoms described in the history of present illness. He was evaluated in the context of the global COVID-19 pandemic, which necessitated consideration that the patient might be at risk for infection with the SARS-CoV-2 virus that causes COVID-19. Institutional protocols and algorithms that pertain to the evaluation of patients at risk for COVID-19 are in a state of rapid change based on information released by regulatory bodies including the CDC and federal and state organizations. These policies and algorithms were followed during the patient's care in the ED.  I personally reviewed patient's medical chart and all notes from triage and staff during today's encounter. I have also ordered and reviewed all labs and imaging that I felt to be medically necessary in the evaluation of this patient's complaints and with consideration of their physical exam. If needed, translation services were available and utilized.   Patient with considerable right lower extremity swelling and edema relative to contralateral leg.  He has a history of chronic, well-established lymphedema.  He needs to follow-up with outpatient lymphedema clinic.  However, given that I have no basis which to compare, will take is worried that his leg is more swollen than usual.  We will proceed with DVT study.  He denies any history of DVT, recent surgeries or immobilizations, illicit drug use, no hormone therapy, or any other particular concerning history.  He is neurovascularly intact and is able to ambulate, albeit with mildly worsening antalgia.  While the vascular team noted that they had ordered new compression stockings, he has not yet picked them up and was unaware of new order.  We will  print him a prescription today for same 30 to 40 mmHg thigh-high compression stockings that he can take to a pharmacy.  In  the unlikely event that his DVT study is negative, will treat with a short course of doxycycline to cover for cellulitis given that his right lower extremity is reportedly more swollen, red, and tender to the touch.  It is warm on my exam.  DVT study is negative.  I was notified by the ultrasound technician that he appeared to be touching himself inappropriately throughout the exam.  I asked that she chart that behavior and told him that it was not going to be tolerated.  Will discharge him home with doxycyline x7 days to cover for cellulitis.    He can follow-up with primary care provider regarding today's ED encounter and for ongoing evaluation.  He also needs to follow-up with the lymphedema clinic as well as vascular as needed.  ER return precautions discussed.  Patient voices understanding is agreeable to the plan.  Final Clinical Impression(s) / ED Diagnoses Final diagnoses:  Right leg pain    Rx / DC Orders ED Discharge Orders          Ordered    Compression stockings       Comments: Right leg, thigh-high, 30-40 mmHg. Wear all times except when sleeping at night.   12/22/20 1638    doxycycline (VIBRAMYCIN) 100 MG capsule  2 times daily        12/22/20 1638             Reita Chard 12/22/20 [place], Dan, DO 12/23/20 1500

## 2020-12-22 NOTE — ED Notes (Signed)
Presents with RLE pain, pt states pain and swelling has been worse for the past 2 days. RLE noted to be very swollen and feels tight upon palpation, also feels warm to touch and slight redness also noted, Negative Homans sign noted. Unable to palpate pulses, strong doppler pedal and post tib pulses easily located at rt foot are noted.

## 2021-03-28 ENCOUNTER — Other Ambulatory Visit: Payer: Self-pay

## 2021-03-28 ENCOUNTER — Encounter (HOSPITAL_COMMUNITY): Payer: Self-pay

## 2021-03-28 ENCOUNTER — Emergency Department (HOSPITAL_COMMUNITY)
Admission: EM | Admit: 2021-03-28 | Discharge: 2021-03-29 | Disposition: A | Discharge status: Home / Self Care | Payer: Medicaid Other | Attending: Emergency Medicine | Admitting: Emergency Medicine

## 2021-03-28 DIAGNOSIS — M7989 Other specified soft tissue disorders: Secondary | ICD-10-CM | POA: Insufficient documentation

## 2021-03-28 DIAGNOSIS — Z5321 Procedure and treatment not carried out due to patient leaving prior to being seen by health care provider: Secondary | ICD-10-CM | POA: Diagnosis not present

## 2021-03-28 DIAGNOSIS — M79604 Pain in right leg: Secondary | ICD-10-CM | POA: Diagnosis not present

## 2021-03-28 DIAGNOSIS — M79605 Pain in left leg: Secondary | ICD-10-CM | POA: Insufficient documentation

## 2021-03-28 NOTE — ED Triage Notes (Signed)
Patient arrived with right and left leg pain. He said both his legs are swelling. He said his left leg is giving out when he walks. This started hurting today. He think he could have cellulitis.

## 2021-04-02 ENCOUNTER — Other Ambulatory Visit: Payer: Self-pay

## 2021-04-02 ENCOUNTER — Emergency Department (HOSPITAL_COMMUNITY)
Admission: EM | Admit: 2021-04-02 | Discharge: 2021-04-03 | Discharge status: Against Medical Advice | Payer: Medicaid Other

## 2021-04-03 ENCOUNTER — Other Ambulatory Visit: Payer: Self-pay

## 2021-04-03 ENCOUNTER — Encounter (HOSPITAL_COMMUNITY): Payer: Self-pay

## 2021-04-03 ENCOUNTER — Emergency Department (HOSPITAL_COMMUNITY)
Admission: EM | Admit: 2021-04-03 | Discharge: 2021-04-04 | Discharge status: Against Medical Advice | Payer: Medicaid Other | Attending: Emergency Medicine | Admitting: Emergency Medicine

## 2021-04-03 DIAGNOSIS — I10 Essential (primary) hypertension: Secondary | ICD-10-CM | POA: Insufficient documentation

## 2021-04-03 DIAGNOSIS — M79604 Pain in right leg: Secondary | ICD-10-CM | POA: Diagnosis not present

## 2021-04-03 DIAGNOSIS — Z5321 Procedure and treatment not carried out due to patient leaving prior to being seen by health care provider: Secondary | ICD-10-CM | POA: Insufficient documentation

## 2021-04-03 DIAGNOSIS — M7989 Other specified soft tissue disorders: Secondary | ICD-10-CM | POA: Insufficient documentation

## 2021-04-03 DIAGNOSIS — Z23 Encounter for immunization: Secondary | ICD-10-CM | POA: Insufficient documentation

## 2021-04-03 DIAGNOSIS — R531 Weakness: Secondary | ICD-10-CM | POA: Diagnosis not present

## 2021-04-03 DIAGNOSIS — S81801A Unspecified open wound, right lower leg, initial encounter: Secondary | ICD-10-CM

## 2021-04-03 MED ORDER — TETANUS-DIPHTH-ACELL PERTUSSIS 5-2.5-18.5 LF-MCG/0.5 IM SUSY: 0.5000 mL | PREFILLED_SYRINGE | Freq: Once | INTRAMUSCULAR | Status: DC

## 2021-04-03 NOTE — ED Provider Notes (Signed)
Emergency Medicine Provider Triage Evaluation Note  Kimmie Doren , a 50 y.o. male  was evaluated in triage.  Pt complains of a scrape/puncture to his right lower thigh.  History of right-sided lymphedema.  States that tetanus is out of date.  He wanted to get checked to see if he had an infection.  Also complains of his legs "giving out" and wanted to have this checked as well.  Review of Systems  Positive: Wound Negative: Fever  Physical Exam  BP 124/83 (BP Location: Right Arm)   Pulse 91   Temp 98.6 F (37 C) (Oral)   Resp 18   SpO2 98%  Gen:   Awake, no distress   Resp:  Normal effort  MSK:   Moves extremities without difficulty  Other:  Small superficial linear abrasions to the right anterior inferior upper leg, no signs of infection.  Patient is able to stand and walk without difficulty.  Medical Decision Making  Medically screening exam initiated at 10:26 PM.  Appropriate orders placed.  Alakai Macbride was informed that the remainder of the evaluation will be completed by another provider, this initial triage assessment does not replace that evaluation, and the importance of remaining in the ED until their evaluation is complete.    Carlisle Cater, PA-C 04/03/21 2227    Godfrey Pick, MD 04/05/21 1159

## 2021-04-03 NOTE — ED Triage Notes (Signed)
Pt complains of right leg swelling from a scrape on his knee (skin not broken) since today. Pt reports having lymphedema to his right leg. Pt complains of bilateral leg weakness.

## 2021-04-04 ENCOUNTER — Encounter (HOSPITAL_COMMUNITY): Payer: Self-pay | Admitting: Emergency Medicine

## 2021-04-04 ENCOUNTER — Emergency Department (HOSPITAL_COMMUNITY)
Admission: EM | Admit: 2021-04-04 | Discharge: 2021-04-05 | Disposition: A | Discharge status: Home / Self Care | Payer: Medicaid Other | Source: Home / Self Care | Attending: Emergency Medicine | Admitting: Emergency Medicine

## 2021-04-04 DIAGNOSIS — M79604 Pain in right leg: Secondary | ICD-10-CM | POA: Insufficient documentation

## 2021-04-04 DIAGNOSIS — M7989 Other specified soft tissue disorders: Secondary | ICD-10-CM | POA: Insufficient documentation

## 2021-04-04 DIAGNOSIS — I10 Essential (primary) hypertension: Secondary | ICD-10-CM | POA: Insufficient documentation

## 2021-04-04 DIAGNOSIS — L03115 Cellulitis of right lower limb: Secondary | ICD-10-CM

## 2021-04-04 DIAGNOSIS — Z23 Encounter for immunization: Secondary | ICD-10-CM | POA: Insufficient documentation

## 2021-04-04 NOTE — ED Triage Notes (Signed)
Pt reports injury to right knee and now having pain, swelling, redness and warmth to the area. Denies fevers.

## 2021-04-04 NOTE — ED Notes (Signed)
Called to update vitals signs with no answer

## 2021-04-05 ENCOUNTER — Ambulatory Visit (HOSPITAL_COMMUNITY): Payer: Medicaid Other

## 2021-04-05 ENCOUNTER — Other Ambulatory Visit: Payer: Self-pay

## 2021-04-05 MED ORDER — TETANUS-DIPHTH-ACELL PERTUSSIS 5-2.5-18.5 LF-MCG/0.5 IM SUSY
0.5000 mL | PREFILLED_SYRINGE | Freq: Once | INTRAMUSCULAR | Status: AC
  Administered 2021-04-05: 0.5 mL via INTRAMUSCULAR
  Filled 2021-04-05: qty 0.5

## 2021-04-05 MED ORDER — STERILE WATER FOR INJECTION IJ SOLN
INTRAMUSCULAR | Status: AC
  Filled 2021-04-05: qty 10

## 2021-04-05 MED ORDER — CEPHALEXIN 500 MG PO CAPS
500.0000 mg | ORAL_CAPSULE | Freq: Once | ORAL | Status: AC
  Administered 2021-04-05: 500 mg via ORAL
  Filled 2021-04-05: qty 1

## 2021-04-05 MED ORDER — CEFTRIAXONE SODIUM 1 G IJ SOLR
1.0000 g | Freq: Once | INTRAMUSCULAR | Status: AC
  Administered 2021-04-05: 1 g via INTRAMUSCULAR
  Filled 2021-04-05: qty 10

## 2021-04-05 MED ORDER — CEPHALEXIN 500 MG PO CAPS: 500.0000 mg | ORAL_CAPSULE | Freq: Four times a day (QID) | ORAL | 0 refills | Status: DC

## 2021-04-05 NOTE — ED Notes (Signed)
Patient is very rude and cussing staff.

## 2021-04-05 NOTE — ED Provider Notes (Signed)
[facility] DEPT Provider Note   CSN: [id number] Arrival date & time: 04/04/21  2238     History No chief complaint on file.   Jonathan Meadows is a 50 y.o. male.  Patient is a 50 year old male with past medical history of schizophrenia, lymphedema, hypertension.  Patient presenting today with complaints of right leg pain redness and swelling.  He reports scratching his knee several days ago, then developed redness just below the abrasion.  It has now extended down his leg.  He denies any fevers or chills.  His last tetanus shot is unknown.  The history is provided by the patient.      Past Medical History:  Diagnosis Date   Anemia    Cellulitis    Homelessness    Lymphedema of leg    Paranoid schizophrenia ([place])    pt denies   Septic joint of left shoulder region ([place]) 10/27/2017    Patient Active Problem List   Diagnosis Date Noted   History of schizophrenia    Septic joint of left shoulder region ([place]) 10/27/2017   Adhesive capsulitis of left shoulder    Effusion of left shoulder joint 10/10/2017   Chest pain 10/08/2017   GAD (generalized anxiety disorder) 06/07/2017   Financial difficulties 06/07/2017   Lymphedema 06/07/2017   Gastric outlet obstruction 10/31/2016   Chronic pain associated with significant psychosocial dysfunction 06/04/2016   Schizophrenia, chronic condition with acute exacerbation ([place]) 09/11/2015   Hypertension 12/03/2014   Polysubstance abuse ([place]) 12/03/2014   Schizoaffective disorder, bipolar type ([place]) 12/03/2014   Back pain 08/11/2013   Swollen leg 10/10/2012   Anemia 10/10/2012    Past Surgical History:  Procedure Laterality Date   SHOULDER ARTHROSCOPY Left 10/11/2017   Procedure: ARTHROSCOPY SHOULDER, LEFT;  Surgeon: Newt Minion, MD;  Location: [facility];  Service: Orthopedics;  Laterality: Left;   SHOULDER ARTHROSCOPY WITH SUBACROMIAL DECOMPRESSION, ROTATOR CUFF REPAIR AND BICEP TENDON REPAIR Left 11/24/2017    Procedure: LEFT SHOULDER ARTHROSCOPY, OPEN DISTAL CLAVICLE EXCISION, EXTENSIVE INTRA-ARTICULAR AND EXTRA-ARTICULAR DEBRIDEMENT;  Surgeon: [name], MD;  Location: [facility];  Service: Orthopedics;  Laterality: Left;       Family History  Problem Relation Age of Onset   CAD Other        Neg HX   Hypertension Other        Neg Hx   Diabetes Other        Neg Hx   Cancer Other        Neg Hx   Lupus Mother     Social History   Tobacco Use   Smoking status: Never   Smokeless tobacco: Never  Vaping Use   Vaping Use: Never used  Substance Use Topics   Alcohol use: No   Drug use: No    Home Medications Prior to Admission medications   Medication Sig Start Date End Date Taking? Authorizing Provider  B Complex-C (SUPER B COMPLEX PO) Take 1 tablet by mouth daily.    [provider]  GINSENG PO Take 1 tablet by mouth daily.    [provider]  HYDROcodone-acetaminophen (NORCO/VICODIN) 5-325 MG tablet Take 2 tablets by mouth every 4 (four) hours as needed. 07/05/18   Valarie Merino, MD  ibuprofen (ADVIL,MOTRIN) 600 MG tablet Take 1 tablet (600 mg total) by mouth every 6 (six) hours as needed for moderate pain. 07/10/18   Hayden Rasmussen, MD  omega-3 acid ethyl esters (LOVAZA) 1 g capsule Take 1  g by mouth daily.    [provider]  ondansetron (ZOFRAN ODT) 4 MG disintegrating tablet Take 1 tablet (4 mg total) by mouth every 8 (eight) hours as needed for nausea or vomiting. 06/20/18   Duffy Bruce, MD    Allergies    Pork-derived products  Review of Systems   Review of Systems  All other systems reviewed and are negative.  Physical Exam Updated Vital Signs BP 125/85   Pulse 96   Temp 98.2 F (36.8 C) (Oral)   Resp 16   SpO2 99%   Physical Exam Vitals and nursing note reviewed.  Constitutional:      General: He is not in acute distress.    Appearance: Normal appearance. He is not ill-appearing.  HENT:     Head: Normocephalic and  atraumatic.  Pulmonary:     Effort: Pulmonary effort is normal.  Musculoskeletal:     Comments: The right leg has baseline lymphedema.  He does have redness and warmth just below the right knee and extending towards the ankle.  There is a small abrasion overlying the anterior knee just below the patella.  There is no purulent drainage.  Skin:    General: Skin is warm and dry.  Neurological:     Mental Status: He is alert.    ED Results / Procedures / Treatments   Labs (all labs ordered are listed, but only abnormal results are displayed) Labs Reviewed - No data to display  EKG None  Radiology No results found.  Procedures Procedures   Medications Ordered in ED Medications  cefTRIAXone (ROCEPHIN) injection 1 g (has no administration in time range)  cephALEXin (KEFLEX) capsule 500 mg (has no administration in time range)  Tdap (BOOSTRIX) injection 0.5 mL (has no administration in time range)    ED Course  I have reviewed the triage vital signs and the nursing notes.  Pertinent labs & imaging results that were available during my care of the patient were reviewed by me and considered in my medical decision making (see chart for details).    MDM Rules/Calculators/A&P  Patient presenting with complaints of leg pain and swelling and redness that I suspect is cellulitis.  He cut his knee several days ago on a piece of metal and the redness began afterward.  Also in the differential is a DVT.  Patient will be set up for outpatient ultrasound to rule out this possibility.  Final Clinical Impression(s) / ED Diagnoses Final diagnoses:  None    Rx / DC Orders ED Discharge Orders     None        Veryl Speak, MD 04/05/21 [phone]

## 2021-04-05 NOTE — Discharge Instructions (Addendum)
Begin taking Keflex as prescribed.  Return tomorrow at the given time for an ultrasound of your leg to rule out a blood clot.  Return to the ER in the meantime if symptoms significantly worsen or change.

## 2021-04-06 ENCOUNTER — Ambulatory Visit (HOSPITAL_COMMUNITY): Payer: Medicaid Other | Attending: Emergency Medicine

## 2021-04-06 ENCOUNTER — Telehealth (HOSPITAL_COMMUNITY): Payer: Self-pay | Admitting: Emergency Medicine

## 2021-04-06 MED ORDER — CEPHALEXIN 500 MG PO CAPS: 500.0000 mg | ORAL_CAPSULE | Freq: Four times a day (QID) | ORAL | 0 refills | Status: AC

## 2021-04-06 NOTE — Telephone Encounter (Signed)
Contacted by charge nurse reports that was unable to pick up prescription of Keflex due to losing his written prescription.  We will discontinue written prescription and sent prescription to [facility] on [address] in [place].

## 2023-01-23 ENCOUNTER — Encounter (HOSPITAL_COMMUNITY): Payer: Self-pay | Admitting: *Deleted

## 2023-01-23 ENCOUNTER — Other Ambulatory Visit: Payer: Self-pay

## 2023-01-23 ENCOUNTER — Emergency Department (HOSPITAL_COMMUNITY)
Admission: EM | Admit: 2023-01-23 | Discharge: 2023-01-23 | Disposition: A | Discharge status: Home / Self Care | Payer: MEDICAID | Attending: Emergency Medicine | Admitting: Emergency Medicine

## 2023-01-23 DIAGNOSIS — R112 Nausea with vomiting, unspecified: Secondary | ICD-10-CM | POA: Insufficient documentation

## 2023-01-23 DIAGNOSIS — R600 Localized edema: Secondary | ICD-10-CM | POA: Insufficient documentation

## 2023-01-23 DIAGNOSIS — R110 Nausea: Secondary | ICD-10-CM

## 2023-01-23 DIAGNOSIS — R519 Headache, unspecified: Secondary | ICD-10-CM | POA: Diagnosis present

## 2023-01-23 DIAGNOSIS — Z1152 Encounter for screening for COVID-19: Secondary | ICD-10-CM | POA: Diagnosis not present

## 2023-01-23 DIAGNOSIS — L237 Allergic contact dermatitis due to plants, except food: Secondary | ICD-10-CM | POA: Diagnosis not present

## 2023-01-23 DIAGNOSIS — E876 Hypokalemia: Secondary | ICD-10-CM | POA: Diagnosis not present

## 2023-01-23 LAB — URINALYSIS, ROUTINE W REFLEX MICROSCOPIC
Bacteria, UA: NONE SEEN
Bilirubin Urine: NEGATIVE
Glucose, UA: NEGATIVE mg/dL
Ketones, ur: 20 mg/dL — AB
Leukocytes,Ua: NEGATIVE
Nitrite: NEGATIVE
Protein, ur: 100 mg/dL — AB
Specific Gravity, Urine: 1.017 (ref 1.005–1.030)
pH: 6 (ref 5.0–8.0)

## 2023-01-23 LAB — COMPREHENSIVE METABOLIC PANEL
ALT: 63 U/L — ABNORMAL HIGH (ref 0–44)
AST: 66 U/L — ABNORMAL HIGH (ref 15–41)
Albumin: 3 g/dL — ABNORMAL LOW (ref 3.5–5.0)
Alkaline Phosphatase: 70 U/L (ref 38–126)
Anion gap: 12 (ref 5–15)
BUN: 26 mg/dL — ABNORMAL HIGH (ref 6–20)
CO2: 24 mmol/L (ref 22–32)
Calcium: 8.3 mg/dL — ABNORMAL LOW (ref 8.9–10.3)
Chloride: 95 mmol/L — ABNORMAL LOW (ref 98–111)
Creatinine, Ser: 1.46 mg/dL — ABNORMAL HIGH (ref 0.61–1.24)
GFR, Estimated: 58 mL/min — ABNORMAL LOW (ref >60)
Glucose, Bld: 81 mg/dL (ref 70–99)
Potassium: 3.2 mmol/L — ABNORMAL LOW (ref 3.5–5.1)
Sodium: 131 mmol/L — ABNORMAL LOW (ref 135–145)
Total Bilirubin: 1.4 mg/dL — ABNORMAL HIGH (ref 0.3–1.2)
Total Protein: 8.4 g/dL — ABNORMAL HIGH (ref 6.5–8.1)

## 2023-01-23 LAB — CBC
HCT: 40.4 % (ref 39.0–52.0)
Hemoglobin: 14.1 g/dL (ref 13.0–17.0)
MCH: 31.8 pg (ref 26.0–34.0)
MCHC: 34.9 g/dL (ref 30.0–36.0)
MCV: 91 fL (ref 80.0–100.0)
Platelets: 207 10*3/uL (ref 150–400)
RBC: 4.44 MIL/uL (ref 4.22–5.81)
RDW: 12.6 % (ref 11.5–15.5)
WBC: 11.9 10*3/uL — ABNORMAL HIGH (ref 4.0–10.5)
nRBC: 0 % (ref 0.0–0.2)

## 2023-01-23 LAB — RESP PANEL BY RT-PCR (RSV, FLU A&B, COVID)  RVPGX2
Influenza A by PCR: NEGATIVE
Influenza B by PCR: NEGATIVE
Resp Syncytial Virus by PCR: NEGATIVE
SARS Coronavirus 2 by RT PCR: NEGATIVE

## 2023-01-23 LAB — LIPASE, BLOOD: Lipase: 33 U/L (ref 11–51)

## 2023-01-23 LAB — CK: Total CK: 269 U/L (ref 49–397)

## 2023-01-23 MED ORDER — POTASSIUM CHLORIDE ER 10 MEQ PO TBCR: 10.0000 meq | EXTENDED_RELEASE_TABLET | Freq: Every day | ORAL | 0 refills | Status: AC

## 2023-01-23 MED ORDER — ACETAMINOPHEN 500 MG PO TABS
1000.0000 mg | ORAL_TABLET | Freq: Once | ORAL | Status: AC
  Administered 2023-01-23: 1000 mg via ORAL
  Filled 2023-01-23: qty 2

## 2023-01-23 MED ORDER — ONDANSETRON HCL 4 MG/2ML IJ SOLN
4.0000 mg | Freq: Once | INTRAMUSCULAR | Status: AC
  Administered 2023-01-23: 4 mg via INTRAVENOUS
  Filled 2023-01-23: qty 2

## 2023-01-23 MED ORDER — DEXAMETHASONE SODIUM PHOSPHATE 10 MG/ML IJ SOLN
6.0000 mg | Freq: Once | INTRAMUSCULAR | Status: AC
  Administered 2023-01-23: 6 mg via INTRAVENOUS
  Filled 2023-01-23: qty 1

## 2023-01-23 MED ORDER — ONDANSETRON HCL 4 MG PO TABS: 4.0000 mg | ORAL_TABLET | Freq: Three times a day (TID) | ORAL | 0 refills | Status: AC | PRN

## 2023-01-23 MED ORDER — HYDROXYZINE HCL 25 MG PO TABS: 25.0000 mg | ORAL_TABLET | Freq: Three times a day (TID) | ORAL | 0 refills | Status: AC | PRN

## 2023-01-23 MED ORDER — METHYLPREDNISOLONE 4 MG PO TBPK: ORAL_TABLET | ORAL | 0 refills | Status: AC

## 2023-01-23 MED ORDER — LACTATED RINGERS IV BOLUS
1000.0000 mL | Freq: Once | INTRAVENOUS | Status: AC
  Administered 2023-01-23: 1000 mL via INTRAVENOUS

## 2023-01-23 NOTE — ED Triage Notes (Signed)
States he has been vomiting and has a headache. Pt states he has not had any thing to eat, now complains leg is sore and he needs a walker. Pt adds to complaints and wants to know how long he will have wait, more concerned about weighing self than answering questions.

## 2023-01-23 NOTE — ED Provider Notes (Signed)
[facility] Provider Note   CSN: [id number] Arrival date & time: 01/23/23  1249     History  Chief Complaint  Patient presents with   Emesis   Headache    Jonathan Meadows is a 52 y.o. male.  HPI Patient reports he has had vomiting and diarrhea for several days.  He denies abdominal pain.  No fever that he is aware of.  He is not sure if he might of gotten some food poisoning.  Nothing specifically that he can think of.  He reports he has a generalized frontal headache now as well.  No visual changes or focal weakness numbness or tingling.  He reports he also feels like his chronic lymphedema in the left lower extremity is worse than typical.  He has rash over much of his arms and legs which he reports is poison ivy.  He reports he was doing some yard work for a friend outside a few days ago and now has got a lot of itchy rash.  Has been trying to put on calamine lotion with no relief.    Home Medications Prior to Admission medications   Medication Sig Start Date End Date Taking? Authorizing Provider  B Complex-C (SUPER B COMPLEX PO) Take 1 tablet by mouth daily.    [provider]  cephALEXin (KEFLEX) 500 MG capsule Take 1 capsule (500 mg total) by mouth 4 (four) times daily. 04/06/21   Haskel Schroeder, PA-C  GINSENG PO Take 1 tablet by mouth daily.    [provider]  HYDROcodone-acetaminophen (NORCO/VICODIN) 5-325 MG tablet Take 2 tablets by mouth every 4 (four) hours as needed. 07/05/18   Wynetta Fines, MD  ibuprofen (ADVIL,MOTRIN) 600 MG tablet Take 1 tablet (600 mg total) by mouth every 6 (six) hours as needed for moderate pain. 07/10/18   Terrilee Files, MD  omega-3 acid ethyl esters (LOVAZA) 1 g capsule Take 1 g by mouth daily.    [provider]  ondansetron (ZOFRAN ODT) 4 MG disintegrating tablet Take 1 tablet (4 mg total) by mouth every 8 (eight) hours as needed for nausea or vomiting. 06/20/18    Shaune Pollack, MD      Allergies    Pork-derived products    Review of Systems   Review of Systems  Physical Exam Updated Vital Signs BP 122/86 (BP Location: Right Arm)   Pulse 98   Temp 98.5 F (36.9 C) (Oral)   Resp 17   Ht 6\' 1"  (1.854 m)   SpO2 100%   BMI 29.95 kg/m  Physical Exam Constitutional:      Comments: Alert with clear mental status.  Nontoxic.  No respiratory distress.  HENT:     Head: Normocephalic and atraumatic.     Mouth/Throat:     Mouth: Mucous membranes are moist.     Pharynx: Oropharynx is clear.  Eyes:     Extraocular Movements: Extraocular movements intact.     Pupils: Pupils are equal, round, and reactive to light.  Cardiovascular:     Rate and Rhythm: Normal rate and regular rhythm.  Pulmonary:     Effort: Pulmonary effort is normal.     Breath sounds: Normal breath sounds.  Abdominal:     General: There is no distension.     Palpations: Abdomen is soft.     Tenderness: There is no abdominal tenderness. There is no guarding.  Musculoskeletal:     Cervical back: Neck supple.  Comments: Patient has very large edema of the right lower extremity.  This is hard and brawny.  Skin is thin and shiny.  Leg is somewhat warm to the touch.  Left lower extremity does not have any edema.  All 4 extremities patient has applied calamine lotion and has a scattered papular rash underneath.  The abdomen is relatively spaared, chest and face relatively spared.  Neurological:     General: No focal deficit present.     Mental Status: He is oriented to person, place, and time.     Coordination: Coordination normal.     ED Results / Procedures / Treatments   Labs (all labs ordered are listed, but only abnormal results are displayed) Labs Reviewed  RESP PANEL BY RT-PCR (RSV, FLU A&B, COVID)  [id number]  LIPASE, BLOOD  COMPREHENSIVE METABOLIC PANEL  CBC  URINALYSIS, ROUTINE W REFLEX MICROSCOPIC  CK    EKG None  Radiology No results  found.  Procedures Procedures    Medications Ordered in ED Medications  acetaminophen (TYLENOL) tablet 1,000 mg (has no administration in time range)  lactated ringers bolus 1,000 mL (has no administration in time range)  ondansetron (ZOFRAN) injection 4 mg (has no administration in time range)    ED Course/ Medical Decision Making/ A&P                             Medical Decision Making Amount and/or Complexity of Data Reviewed Labs: ordered.  Risk OTC drugs. Prescription drug management.   Patient presents as outlined.  Main symptoms are vomiting and diarrhea and generalized headache.  Clinically patient is nontoxic and well in appearance.  He does have stable vital signs at this time with normal blood pressure, no fever and heart rate mildly tachycardic.  Will proceed with diagnostic evaluation including lab work and urinalysis.  Will initiate fluid resuscitation.  Patient does not have signs of meningismus.  He reports headache after vomiting and diarrhea but mental status is clear no meningismus no somnolence.  Low suspicion for meningitis.  Other second complaint is rash on arms and legs.  Patient cites exposure to poison ivy.  Distribution is consistent with this.  He does not have any significant rash centrally in areas typically covered by clothing.  Patient also reports increased swelling of the right lower extremity.  He has had longstanding lymphedema.  I have reviewed history and there have been multiple DVT studies over the years and no history of DVT.  At this time I have low suspicion for acute DVT given chronicity of problem and no prior history of.  At this time we will forego DVT study and make ongoing recommendations for management of lymphedema.  Patient to obtain basic lab work.  At this time lab work pending.  Dr. Renaye Rakers to review.  I do not feel patient needs DVT study as stated above.  Symptoms predominantly are vomiting and diarrhea with generalized headache  and malaise.  Patient is nontoxic.  Will initiate some hydration.  If labs are stable at baseline and consistent with prior values and mild dehydration, I do feel patient will be appropriate for discharge.  Concomitant poison ivy.  Rash is consistent with a contact dermatitis.  I do not feel like this is associated with his acute illness which I suspect is viral in nature.  Appropriate for steroid therapy on outpatient basis if diagnostic evaluation consistent with discharge planning.  Final Clinical Impression(s) / ED Diagnoses Final diagnoses:  Hypokalemia  Nausea  Poison ivy dermatitis    Rx / DC Orders ED Discharge Orders     None         Arby Barrette, MD 02/05/23 1541

## 2023-01-23 NOTE — ED Provider Notes (Signed)
Pt received in signout  Briefly 52 yo male presenting multiple concerns, including leg soreness, vomiting, nausea, headache, feeling well, and poison ivy exposure/rash  Multiple DVT studies negative in past including in 11/2020 for right LE swelling  Pending labs   Physical Exam  BP 122/86 (BP Location: Right Arm)   Pulse 98   Temp 98.5 F (36.9 C) (Oral)   Resp 17   Ht 6\' 1"  (1.854 m)   SpO2 100%   BMI 29.95 kg/m   Physical Exam  Procedures  Procedures  ED Course / MDM    Medical Decision Making Amount and/or Complexity of Data Reviewed Labs: ordered.  Risk OTC drugs. Prescription drug management.   Patient was reassessed and is feeling much better with the IV medications.  He is hungry and asking for food.  We discussed a short course of steroids given the extent of his poison ivy rash which she is amenable to.  Will also prescribe Atarax.  He already has calamine lotion at home.  He does not want diuretic for his lymphedema of the right lower extremity.  He is stable otherwise at this time for discharge       Terald Sleeper, MD 01/23/23 [phone]

## 2023-01-23 NOTE — Discharge Instructions (Addendum)
You need to follow-up with your primary care clinic or doctor to have your blood work rechecked in the next 1-2 weeks, and to have your rash looked at again.
